# Patient Record
Sex: Female | Born: 1960 | Hispanic: No | Smoking: Former smoker
Health system: Southern US, Community
[De-identification: ages and names within clinical notes are randomized; demographics above are authoritative.]

## PROBLEM LIST (undated history)

## (undated) DIAGNOSIS — R200 Anesthesia of skin: Secondary | ICD-10-CM

## (undated) DIAGNOSIS — M19011 Primary osteoarthritis, right shoulder: Secondary | ICD-10-CM

## (undated) DIAGNOSIS — C50919 Malignant neoplasm of unspecified site of unspecified female breast: Secondary | ICD-10-CM

## (undated) DIAGNOSIS — E039 Hypothyroidism, unspecified: Secondary | ICD-10-CM

## (undated) DIAGNOSIS — Z923 Personal history of irradiation: Secondary | ICD-10-CM

## (undated) DIAGNOSIS — I10 Essential (primary) hypertension: Secondary | ICD-10-CM

## (undated) DIAGNOSIS — F32A Depression, unspecified: Secondary | ICD-10-CM

## (undated) DIAGNOSIS — E785 Hyperlipidemia, unspecified: Secondary | ICD-10-CM

## (undated) DIAGNOSIS — M542 Cervicalgia: Secondary | ICD-10-CM

## (undated) DIAGNOSIS — F329 Major depressive disorder, single episode, unspecified: Secondary | ICD-10-CM

## (undated) DIAGNOSIS — C801 Malignant (primary) neoplasm, unspecified: Secondary | ICD-10-CM

## (undated) DIAGNOSIS — M1990 Unspecified osteoarthritis, unspecified site: Secondary | ICD-10-CM

## (undated) DIAGNOSIS — K219 Gastro-esophageal reflux disease without esophagitis: Secondary | ICD-10-CM

## (undated) DIAGNOSIS — F419 Anxiety disorder, unspecified: Secondary | ICD-10-CM

## (undated) HISTORY — PX: BREAST BIOPSY: SHX20

## (undated) HISTORY — PX: BREAST LUMPECTOMY: SHX2

## (undated) HISTORY — PX: APPENDECTOMY: SHX54

## (undated) HISTORY — PX: SHOULDER ARTHROSCOPY: SHX128

## (undated) HISTORY — PX: COLONOSCOPY: SHX174

## (undated) HISTORY — PX: DILATION AND CURETTAGE OF UTERUS: SHX78

---

## 2000-10-16 ENCOUNTER — Emergency Department (HOSPITAL_COMMUNITY): Admission: EM | Admit: 2000-10-16 | Discharge: 2000-10-16 | Payer: Self-pay | Admitting: *Deleted

## 2000-10-26 ENCOUNTER — Encounter: Admission: RE | Admit: 2000-10-26 | Discharge: 2000-10-26 | Payer: Self-pay | Admitting: Internal Medicine

## 2001-04-06 ENCOUNTER — Encounter: Admission: RE | Admit: 2001-04-06 | Discharge: 2001-04-06 | Payer: Self-pay

## 2001-04-11 ENCOUNTER — Encounter: Admission: RE | Admit: 2001-04-11 | Discharge: 2001-04-11 | Payer: Self-pay

## 2001-07-13 ENCOUNTER — Encounter: Admission: RE | Admit: 2001-07-13 | Discharge: 2001-07-13 | Payer: Self-pay | Admitting: Internal Medicine

## 2001-09-15 ENCOUNTER — Encounter (INDEPENDENT_AMBULATORY_CARE_PROVIDER_SITE_OTHER): Payer: Self-pay | Admitting: *Deleted

## 2001-09-15 ENCOUNTER — Encounter: Payer: Self-pay | Admitting: Emergency Medicine

## 2001-09-16 ENCOUNTER — Encounter: Payer: Self-pay | Admitting: Emergency Medicine

## 2001-09-16 ENCOUNTER — Inpatient Hospital Stay (HOSPITAL_COMMUNITY): Admission: EM | Admit: 2001-09-16 | Discharge: 2001-09-19 | Payer: Self-pay

## 2001-11-20 ENCOUNTER — Encounter: Admission: RE | Admit: 2001-11-20 | Discharge: 2001-11-20 | Payer: Self-pay | Admitting: Obstetrics & Gynecology

## 2001-12-13 ENCOUNTER — Encounter: Admission: RE | Admit: 2001-12-13 | Discharge: 2001-12-13 | Payer: Self-pay | Admitting: Internal Medicine

## 2002-01-17 ENCOUNTER — Emergency Department (HOSPITAL_COMMUNITY): Admission: EM | Admit: 2002-01-17 | Discharge: 2002-01-17 | Payer: Self-pay

## 2002-01-17 ENCOUNTER — Encounter: Admission: RE | Admit: 2002-01-17 | Discharge: 2002-01-17 | Payer: Self-pay | Admitting: Internal Medicine

## 2002-01-18 ENCOUNTER — Emergency Department (HOSPITAL_COMMUNITY): Admission: EM | Admit: 2002-01-18 | Discharge: 2002-01-18 | Payer: Self-pay | Admitting: Emergency Medicine

## 2002-03-05 ENCOUNTER — Encounter: Admission: RE | Admit: 2002-03-05 | Discharge: 2002-03-05 | Payer: Self-pay | Admitting: Internal Medicine

## 2002-03-12 ENCOUNTER — Encounter: Admission: RE | Admit: 2002-03-12 | Discharge: 2002-03-12 | Payer: Self-pay | Admitting: Internal Medicine

## 2002-04-10 ENCOUNTER — Encounter: Payer: Self-pay | Admitting: Obstetrics

## 2002-04-10 ENCOUNTER — Inpatient Hospital Stay (HOSPITAL_COMMUNITY): Admission: AD | Admit: 2002-04-10 | Discharge: 2002-04-10 | Payer: Self-pay | Admitting: Obstetrics

## 2002-04-11 ENCOUNTER — Ambulatory Visit (HOSPITAL_COMMUNITY): Admission: RE | Admit: 2002-04-11 | Discharge: 2002-04-11 | Payer: Self-pay | Admitting: Obstetrics

## 2002-04-11 ENCOUNTER — Encounter (INDEPENDENT_AMBULATORY_CARE_PROVIDER_SITE_OTHER): Payer: Self-pay

## 2002-04-12 ENCOUNTER — Inpatient Hospital Stay (HOSPITAL_COMMUNITY): Admission: AD | Admit: 2002-04-12 | Discharge: 2002-04-12 | Payer: Self-pay | Admitting: Obstetrics

## 2002-04-12 ENCOUNTER — Encounter: Payer: Self-pay | Admitting: Obstetrics

## 2002-11-12 ENCOUNTER — Ambulatory Visit (HOSPITAL_COMMUNITY): Admission: AD | Admit: 2002-11-12 | Discharge: 2002-11-12 | Payer: Self-pay | Admitting: Obstetrics

## 2002-11-12 ENCOUNTER — Encounter (INDEPENDENT_AMBULATORY_CARE_PROVIDER_SITE_OTHER): Payer: Self-pay

## 2002-11-13 ENCOUNTER — Encounter: Payer: Self-pay | Admitting: Obstetrics

## 2002-11-13 ENCOUNTER — Encounter (INDEPENDENT_AMBULATORY_CARE_PROVIDER_SITE_OTHER): Payer: Self-pay

## 2003-05-20 ENCOUNTER — Inpatient Hospital Stay (HOSPITAL_COMMUNITY): Admission: AD | Admit: 2003-05-20 | Discharge: 2003-05-20 | Payer: Self-pay | Admitting: *Deleted

## 2003-06-19 ENCOUNTER — Inpatient Hospital Stay (HOSPITAL_COMMUNITY): Admission: AD | Admit: 2003-06-19 | Discharge: 2003-06-19 | Payer: Self-pay | Admitting: Obstetrics

## 2003-08-01 ENCOUNTER — Inpatient Hospital Stay (HOSPITAL_COMMUNITY): Admission: AD | Admit: 2003-08-01 | Discharge: 2003-08-01 | Payer: Self-pay | Admitting: Obstetrics

## 2003-11-22 ENCOUNTER — Inpatient Hospital Stay (HOSPITAL_COMMUNITY): Admission: AD | Admit: 2003-11-22 | Discharge: 2003-11-22 | Payer: Self-pay | Admitting: Obstetrics

## 2003-12-12 ENCOUNTER — Encounter: Admission: RE | Admit: 2003-12-12 | Discharge: 2003-12-12 | Payer: Self-pay | Admitting: Obstetrics

## 2003-12-15 ENCOUNTER — Encounter: Admission: RE | Admit: 2003-12-15 | Discharge: 2003-12-15 | Payer: Self-pay | Admitting: Obstetrics

## 2003-12-15 ENCOUNTER — Ambulatory Visit (HOSPITAL_COMMUNITY): Admission: RE | Admit: 2003-12-15 | Discharge: 2003-12-15 | Payer: Self-pay | Admitting: Obstetrics

## 2003-12-18 ENCOUNTER — Ambulatory Visit (HOSPITAL_COMMUNITY): Admission: AD | Admit: 2003-12-18 | Discharge: 2003-12-18 | Payer: Self-pay | Admitting: Obstetrics

## 2003-12-18 ENCOUNTER — Encounter: Admission: RE | Admit: 2003-12-18 | Discharge: 2003-12-18 | Payer: Self-pay | Admitting: Obstetrics

## 2003-12-21 ENCOUNTER — Inpatient Hospital Stay (HOSPITAL_COMMUNITY): Admission: AD | Admit: 2003-12-21 | Discharge: 2003-12-21 | Payer: Self-pay | Admitting: Obstetrics

## 2003-12-22 ENCOUNTER — Encounter: Admission: RE | Admit: 2003-12-22 | Discharge: 2003-12-22 | Payer: Self-pay | Admitting: Obstetrics

## 2003-12-25 ENCOUNTER — Encounter: Admission: RE | Admit: 2003-12-25 | Discharge: 2003-12-25 | Payer: Self-pay | Admitting: Obstetrics

## 2003-12-29 ENCOUNTER — Encounter: Admission: RE | Admit: 2003-12-29 | Discharge: 2003-12-29 | Payer: Self-pay | Admitting: *Deleted

## 2004-01-02 ENCOUNTER — Encounter: Admission: RE | Admit: 2004-01-02 | Discharge: 2004-01-02 | Payer: Self-pay | Admitting: Obstetrics

## 2004-01-05 ENCOUNTER — Encounter (INDEPENDENT_AMBULATORY_CARE_PROVIDER_SITE_OTHER): Payer: Self-pay | Admitting: Specialist

## 2004-01-05 ENCOUNTER — Inpatient Hospital Stay (HOSPITAL_COMMUNITY): Admission: RE | Admit: 2004-01-05 | Discharge: 2004-01-08 | Payer: Self-pay | Admitting: Obstetrics

## 2004-01-09 ENCOUNTER — Inpatient Hospital Stay (HOSPITAL_COMMUNITY): Admission: AD | Admit: 2004-01-09 | Discharge: 2004-01-15 | Payer: Self-pay | Admitting: Obstetrics

## 2004-01-12 ENCOUNTER — Encounter (INDEPENDENT_AMBULATORY_CARE_PROVIDER_SITE_OTHER): Payer: Self-pay | Admitting: Cardiology

## 2004-03-02 ENCOUNTER — Ambulatory Visit (HOSPITAL_COMMUNITY): Admission: RE | Admit: 2004-03-02 | Discharge: 2004-03-02 | Payer: Self-pay | Admitting: Obstetrics

## 2004-03-25 ENCOUNTER — Emergency Department (HOSPITAL_COMMUNITY): Admission: EM | Admit: 2004-03-25 | Discharge: 2004-03-25 | Payer: Self-pay | Admitting: Emergency Medicine

## 2004-04-05 ENCOUNTER — Encounter: Admission: RE | Admit: 2004-04-05 | Discharge: 2004-04-05 | Payer: Self-pay | Admitting: Internal Medicine

## 2004-05-12 ENCOUNTER — Encounter: Admission: RE | Admit: 2004-05-12 | Discharge: 2004-07-15 | Payer: Self-pay | Admitting: Orthopedic Surgery

## 2004-05-14 ENCOUNTER — Emergency Department (HOSPITAL_COMMUNITY): Admission: EM | Admit: 2004-05-14 | Discharge: 2004-05-14 | Payer: Self-pay | Admitting: Emergency Medicine

## 2004-05-17 ENCOUNTER — Encounter: Admission: RE | Admit: 2004-05-17 | Discharge: 2004-05-17 | Payer: Self-pay | Admitting: Internal Medicine

## 2004-07-23 ENCOUNTER — Encounter: Admission: RE | Admit: 2004-07-23 | Discharge: 2004-07-23 | Payer: Self-pay | Admitting: Internal Medicine

## 2004-08-03 ENCOUNTER — Encounter: Admission: RE | Admit: 2004-08-03 | Discharge: 2004-08-03 | Payer: Self-pay | Admitting: Internal Medicine

## 2004-08-11 ENCOUNTER — Encounter: Admission: RE | Admit: 2004-08-11 | Discharge: 2004-08-11 | Payer: Self-pay | Admitting: Internal Medicine

## 2004-08-23 ENCOUNTER — Encounter: Admission: RE | Admit: 2004-08-23 | Discharge: 2004-08-23 | Payer: Self-pay | Admitting: Internal Medicine

## 2004-08-23 ENCOUNTER — Ambulatory Visit: Payer: Self-pay | Admitting: Internal Medicine

## 2004-09-09 ENCOUNTER — Ambulatory Visit: Payer: Self-pay | Admitting: Internal Medicine

## 2004-09-21 ENCOUNTER — Ambulatory Visit: Payer: Self-pay | Admitting: Cardiology

## 2004-10-07 ENCOUNTER — Ambulatory Visit: Payer: Self-pay | Admitting: Cardiology

## 2004-10-13 ENCOUNTER — Emergency Department (HOSPITAL_COMMUNITY): Admission: EM | Admit: 2004-10-13 | Discharge: 2004-10-13 | Payer: Self-pay | Admitting: Family Medicine

## 2004-10-20 ENCOUNTER — Emergency Department (HOSPITAL_COMMUNITY): Admission: EM | Admit: 2004-10-20 | Discharge: 2004-10-20 | Payer: Self-pay | Admitting: Family Medicine

## 2004-10-21 ENCOUNTER — Ambulatory Visit (HOSPITAL_BASED_OUTPATIENT_CLINIC_OR_DEPARTMENT_OTHER): Admission: RE | Admit: 2004-10-21 | Discharge: 2004-10-21 | Payer: Self-pay | Admitting: Pulmonary Disease

## 2005-06-06 ENCOUNTER — Ambulatory Visit: Payer: Self-pay | Admitting: Internal Medicine

## 2005-06-10 ENCOUNTER — Ambulatory Visit (HOSPITAL_COMMUNITY): Admission: RE | Admit: 2005-06-10 | Discharge: 2005-06-10 | Payer: Self-pay | Admitting: Obstetrics

## 2005-07-03 ENCOUNTER — Emergency Department (HOSPITAL_COMMUNITY): Admission: EM | Admit: 2005-07-03 | Discharge: 2005-07-04 | Payer: Self-pay | Admitting: Emergency Medicine

## 2005-07-05 ENCOUNTER — Ambulatory Visit: Payer: Self-pay | Admitting: Internal Medicine

## 2005-07-13 ENCOUNTER — Ambulatory Visit (HOSPITAL_COMMUNITY): Admission: RE | Admit: 2005-07-13 | Discharge: 2005-07-13 | Payer: Self-pay | Admitting: Obstetrics

## 2005-07-13 ENCOUNTER — Encounter (INDEPENDENT_AMBULATORY_CARE_PROVIDER_SITE_OTHER): Payer: Self-pay | Admitting: *Deleted

## 2006-07-11 ENCOUNTER — Ambulatory Visit (HOSPITAL_COMMUNITY): Admission: RE | Admit: 2006-07-11 | Discharge: 2006-07-11 | Payer: Self-pay | Admitting: Obstetrics

## 2006-07-13 ENCOUNTER — Emergency Department (HOSPITAL_COMMUNITY): Admission: EM | Admit: 2006-07-13 | Discharge: 2006-07-13 | Payer: Self-pay | Admitting: Family Medicine

## 2006-08-03 ENCOUNTER — Ambulatory Visit: Payer: Self-pay | Admitting: Internal Medicine

## 2006-10-03 ENCOUNTER — Ambulatory Visit: Payer: Self-pay | Admitting: Internal Medicine

## 2006-10-03 ENCOUNTER — Encounter (INDEPENDENT_AMBULATORY_CARE_PROVIDER_SITE_OTHER): Payer: Self-pay | Admitting: Pulmonary Disease

## 2006-10-05 DIAGNOSIS — E669 Obesity, unspecified: Secondary | ICD-10-CM

## 2006-10-05 DIAGNOSIS — N949 Unspecified condition associated with female genital organs and menstrual cycle: Secondary | ICD-10-CM

## 2006-10-05 DIAGNOSIS — I10 Essential (primary) hypertension: Secondary | ICD-10-CM | POA: Insufficient documentation

## 2006-10-05 DIAGNOSIS — K5289 Other specified noninfective gastroenteritis and colitis: Secondary | ICD-10-CM | POA: Insufficient documentation

## 2006-10-05 DIAGNOSIS — F411 Generalized anxiety disorder: Secondary | ICD-10-CM | POA: Insufficient documentation

## 2006-10-18 DIAGNOSIS — R079 Chest pain, unspecified: Secondary | ICD-10-CM | POA: Insufficient documentation

## 2006-10-18 DIAGNOSIS — N946 Dysmenorrhea, unspecified: Secondary | ICD-10-CM | POA: Insufficient documentation

## 2006-10-18 DIAGNOSIS — G4700 Insomnia, unspecified: Secondary | ICD-10-CM | POA: Insufficient documentation

## 2006-10-18 DIAGNOSIS — M1990 Unspecified osteoarthritis, unspecified site: Secondary | ICD-10-CM | POA: Insufficient documentation

## 2007-01-04 ENCOUNTER — Ambulatory Visit: Payer: Self-pay | Admitting: Internal Medicine

## 2007-01-04 ENCOUNTER — Encounter (INDEPENDENT_AMBULATORY_CARE_PROVIDER_SITE_OTHER): Payer: Self-pay | Admitting: Internal Medicine

## 2007-01-04 LAB — CONVERTED CEMR LAB
Amphetamine Screen, Ur: NEGATIVE
BUN: 10 mg/dL (ref 6–23)
Barbiturate Quant, Ur: NEGATIVE
Benzodiazepines.: NEGATIVE
CO2: 29 meq/L (ref 19–32)
Calcium: 9.2 mg/dL (ref 8.4–10.5)
Chloride: 98 meq/L (ref 96–112)
Cocaine Metabolites: NEGATIVE
Creatinine, Ser: 0.6 mg/dL (ref 0.40–1.20)
Creatinine,U: 34 mg/dL
Glucose, Bld: 152 mg/dL — ABNORMAL HIGH (ref 70–99)
Marijuana Metabolite: NEGATIVE
Methadone: NEGATIVE
Opiates: POSITIVE — AB
Phencyclidine (PCP): NEGATIVE
Potassium: 3.7 meq/L (ref 3.5–5.3)
Propoxyphene: NEGATIVE
Sodium: 138 meq/L (ref 135–145)
TSH: 2.095 microintl units/mL (ref 0.350–5.50)

## 2007-01-12 ENCOUNTER — Emergency Department (HOSPITAL_COMMUNITY): Admission: EM | Admit: 2007-01-12 | Discharge: 2007-01-12 | Payer: Self-pay | Admitting: Emergency Medicine

## 2007-02-02 ENCOUNTER — Telehealth (INDEPENDENT_AMBULATORY_CARE_PROVIDER_SITE_OTHER): Payer: Self-pay | Admitting: Internal Medicine

## 2007-02-22 ENCOUNTER — Ambulatory Visit: Payer: Self-pay | Admitting: Hospitalist

## 2007-02-22 DIAGNOSIS — N8500 Endometrial hyperplasia, unspecified: Secondary | ICD-10-CM

## 2007-03-31 ENCOUNTER — Encounter (INDEPENDENT_AMBULATORY_CARE_PROVIDER_SITE_OTHER): Payer: Self-pay | Admitting: Pulmonary Disease

## 2007-04-08 ENCOUNTER — Emergency Department (HOSPITAL_COMMUNITY): Admission: EM | Admit: 2007-04-08 | Discharge: 2007-04-08 | Payer: Self-pay | Admitting: Emergency Medicine

## 2007-04-17 ENCOUNTER — Telehealth: Payer: Self-pay | Admitting: *Deleted

## 2007-05-03 ENCOUNTER — Ambulatory Visit: Payer: Self-pay | Admitting: Internal Medicine

## 2007-05-03 DIAGNOSIS — R7989 Other specified abnormal findings of blood chemistry: Secondary | ICD-10-CM | POA: Insufficient documentation

## 2007-05-03 LAB — CONVERTED CEMR LAB: Hgb A1c MFr Bld: 6.7 %

## 2007-05-17 ENCOUNTER — Telehealth (INDEPENDENT_AMBULATORY_CARE_PROVIDER_SITE_OTHER): Payer: Self-pay | Admitting: *Deleted

## 2007-06-06 ENCOUNTER — Emergency Department (HOSPITAL_COMMUNITY): Admission: EM | Admit: 2007-06-06 | Discharge: 2007-06-06 | Payer: Self-pay | Admitting: Family Medicine

## 2007-06-14 ENCOUNTER — Telehealth (INDEPENDENT_AMBULATORY_CARE_PROVIDER_SITE_OTHER): Payer: Self-pay | Admitting: *Deleted

## 2007-06-16 ENCOUNTER — Emergency Department (HOSPITAL_COMMUNITY): Admission: EM | Admit: 2007-06-16 | Discharge: 2007-06-17 | Payer: Self-pay | Admitting: Emergency Medicine

## 2007-06-18 ENCOUNTER — Telehealth: Payer: Self-pay | Admitting: *Deleted

## 2007-06-18 ENCOUNTER — Encounter (INDEPENDENT_AMBULATORY_CARE_PROVIDER_SITE_OTHER): Payer: Self-pay | Admitting: *Deleted

## 2007-06-28 ENCOUNTER — Emergency Department (HOSPITAL_COMMUNITY): Admission: EM | Admit: 2007-06-28 | Discharge: 2007-06-28 | Payer: Self-pay | Admitting: Emergency Medicine

## 2007-07-16 ENCOUNTER — Telehealth: Payer: Self-pay | Admitting: *Deleted

## 2007-07-19 ENCOUNTER — Telehealth: Payer: Self-pay | Admitting: *Deleted

## 2007-08-13 ENCOUNTER — Telehealth: Payer: Self-pay | Admitting: *Deleted

## 2007-12-27 DIAGNOSIS — Z9889 Other specified postprocedural states: Secondary | ICD-10-CM | POA: Insufficient documentation

## 2007-12-27 DIAGNOSIS — C801 Malignant (primary) neoplasm, unspecified: Secondary | ICD-10-CM

## 2007-12-27 HISTORY — PX: BREAST LUMPECTOMY: SHX2

## 2007-12-27 HISTORY — DX: Malignant (primary) neoplasm, unspecified: C80.1

## 2008-04-25 DIAGNOSIS — C50919 Malignant neoplasm of unspecified site of unspecified female breast: Secondary | ICD-10-CM | POA: Insufficient documentation

## 2011-05-13 NOTE — Discharge Summary (Signed)
NAME:  Morgan Taylor, Morgan Taylor                          ACCOUNT NO.:  [id number]   MEDICAL RECORD NO.:  [id number]                   PATIENT TYPE:  INP   LOCATION:  9128                                 FACILITY:  [facility]   PHYSICIAN:  Kathreen Cosier, M.D.           DATE OF BIRTH:  [birth year]   DATE OF ADMISSION:  01/05/2004  DATE OF DISCHARGE:  01/08/2004                                 DISCHARGE SUMMARY   HISTORY OF PRESENT ILLNESS:  The patient is a 50 year old, gravida 7, para 4-  0-2-4, EDC January 18, 2004, with history of chronic hypertension controlled  with Aldomet 500 mg q.8h., and diabetes followed by Dr. Chestine Spore.  Her blood  sugars were all normal, and amniocentesis performed on the day of admission  at 38 weeks showed meconium-stained fluid.  The patient was brought in for  induction, and then she decided she wanted to have a cesarean section and  tubal ligation.   HOSPITAL COURSE:  She underwent primary low transverse cesarean section of a  7 pound, 5 ounce female, Apgars 8/9, and postoperatively she did well.  Her  hemoglobin was 10.9.  Blood sugars ranged between 160 and 200, and she was  placed on Amaryl 4 mg p.o. daily with blood sugars taken before breakfast  and before dinner.  She did well, and was discharged on the third  postoperative day after consultation with Dr. Shana Chute, the cardiologist.   DISCHARGE MEDICATIONS:  1. Tylox.  2. Aldomet 500 p.o. b.i.d.   FOLLOW UP:  She is to see me in one week for a post-delivery blood pressure  check.                                               Kathreen Cosier, M.D.    BAM/MEDQ  D:  01/08/2004  T:  01/08/2004  Job:  [id number]

## 2011-05-13 NOTE — Op Note (Signed)
   NAME:  Morgan Taylor, Morgan Taylor                          ACCOUNT NO.:  [id number]   MEDICAL RECORD NO.:  [id number]                   PATIENT TYPE:  MAT   LOCATION:  MATC                                 FACILITY:  [facility]   PHYSICIAN:  Kathreen Cosier, M.D.           DATE OF BIRTH:  January 01, 1961   DATE OF PROCEDURE:  DATE OF DISCHARGE:                                 OPERATIVE REPORT   PREOPERATIVE DIAGNOSIS:  Spontaneous incomplete abortion.   POSTOPERATIVE DIAGNOSIS:  Spontaneous incomplete abortion.   PROCEDURE:   SURGEON:  Kathreen Cosier, M.D.   ANESTHESIA:  MAC.   DESCRIPTION OF PROCEDURE:  With the patient in the lithotomy position,  perineum and vagina prepped and draped and the bladder emptied with a  straight catheter.  A weighted speculum was placed in the vagina.  Anterior  lip of the cervix grasped with a tenaculum.  The cervix was injected at 3, 9  and 12 o'clock with 7 cc of 1% Xylocaine.  The endometrial cavity was  sounded to 9 cm.  The cavity was anterior.  The cervix easily admitted a #10  suction, and the uterine cavity was aspirated until the cavity was clean.  The patient tolerated the procedure well.  She was taken to the recovery  room in good condition.                                               Kathreen Cosier, M.D.    BAM/MEDQ  D:  11/13/2002  T:  11/13/2002  Job:  [id number]

## 2011-05-13 NOTE — H&P (Signed)
[facility]  Patient:    Morgan Taylor, Morgan Taylor Visit Number: [id number] MRN: [id number]          Service Type: MED Location: 2S 0256 01 Attending Physician:  Charlton Haws Dictated by:   Currie Paris, M.D. Admit Date:  09/15/2001                           History and Physical  ACCOUNT [id number]  CHIEF COMPLAINT:  Abdominal pain.  HISTORY OF PRESENT ILLNESS:  Morgan Taylor is a 50 year old lady who came to the emergency room late on the afternoon of September 15, 2001, with abdominal pain.  The pain had actually begun a few days earlier as right upper quadrant pain and had progressively gotten worse, and she is having more and more discomfort.  She said her stools have been neon-green.  The pain kind of radiated to her back.  She had never had anything similar to this before.  She pointed to the right lower quadrant as the area of almost all of her pain.  ALLERGIES:  SULFA.  MEDICATIONS:  Hydrochlorothiazide and ______ for hypertension.  She is also on Allegra.  TOBACCO/ALCOHOL:  Smokes one-half pack per day.  Alcohol, none.  REVIEW OF SYSTEMS:  HEENT:  Negative.  CHEST:  No cough or shortness of breath.  HEART:  No history of murmurs but does have hypertension.  ABDOMEN: Negative except for HPI.  GENITOURINARY:  Negative.  EXTREMITIES:  Negative.  FAMILY HISTORY:  Both parents have diabetes, controlled with oral medications.  PHYSICAL EXAMINATION:  GENERAL:  [age]-year-old female who appears uncomfortable but not toxic or acutely ill.  VITAL SIGNS:  In the emergency room initially were 124/90 blood pressure, pulse 94, respirations 18, temperature 98.1.  HEENT:  Head normocephalic.  Eyes nonicteric.  Pupils round and regular.  NECK:  Supple.  No masses or thyromegaly.  LUNGS:  Clear to auscultation.  HEART:  Regular.  No murmurs, rubs, or gallops.  ABDOMEN:  Guarding in the right lower quadrant.  Markedly tender in the  right lower quadrant, this despite intermittent pain medications throughout the evening and early a.m.  There is some mild rebound localizing to the right lower quadrant, but the rest of the abdomen seems relatively nontender.  Bowel sounds are hypoactive.  PELVIC:  Done by Dr. Jennette Kettle showed some tenderness in the right adnexal area.  LABORATORY DATA:  White count 11,900 with left shift.  Potassium was noted to be 2.6.  Amylase and lipase were normal.  Ultrasound showed some fluid in the pelvis as well as a 2 cm cyst.  Dr. Jennette Kettle stated he felt that that did not adequately explain her pain, and a CT was obtained, which showed appendicitis.  I reviewed the CT with Dr. Chestine Spore, and there is a marked thickening of the area around the appendix with stranding consistent with appendicitis, probably perforated.  IMPRESSION: 1. Acute perforated appendicitis. 2. Hypokalemia. 3. Hypertension.  PLAN:  She has already received some potassium in the emergency department and, I believe, adequate to proceed to surgery.  Will plan open appendectomy as I do not think it is going to be possible to do this laparoscopically.  The patient understands and agrees.  All questions were answered.Dictated by: Currie Paris, M.D. Attending Physician:  Charlton Haws DD:  09/16/01 TD:  09/16/01 Job: [id number]

## 2011-05-13 NOTE — H&P (Signed)
NAME:  Morgan Taylor, Morgan Taylor                          ACCOUNT NO.:  [id number]   MEDICAL RECORD NO.:  [id number]                   PATIENT TYPE:  INP   LOCATION:  9167                                 [facility]ILITY[facility]   PHYSICIAN:  Kathreen Cosier, M.D.           DATE OF BIRTH:  [birth year]   DATE OF ADMISSION:  01/05/2004  DATE OF DISCHARGE:                                HISTORY & PHYSICAL   The patient is a 50 year old, gravida 7, para 4-0-2-4, with an EDC of  January 18, 2004, and her last baby was 13 years ago. The patient is a  chronic hypertensive and diabetic.  She was on Aldomet 500 mg q.8h.  throughout the pregnancy with good control and her diabetes was thought due  to perinatal. She was controlled with 10 units of Novolog in the a.m. and  lunchtime, and 12 units in the p.m., actually those were all normal.  On  admission her fasting blood sugar was 91.  She also had a nonstress test  last weeks when she was 35 to 36 weeks. An amniocentesis was performed today  at 38 weeks for LS. The fluid was meconium stained and she was brought in  for delivery and is also desirous of sterilization. She had a negative GBS.  On admission, her cervix was long, closed, and fingertip. Vertex was -3  station. Cytotec 25 micrograms were inserted vaginally and she has had a few  contractions, no change. The situation was discussed with the patient and  she elected to undergo primary low transverse Cesarean section and tubal  ligation.   PHYSICAL EXAMINATION:  GENERAL: Well-developed female in no acute distress.  HEENT: Negative.  LUNGS: Clear.  HEART: Regular rhythm. No murmurs.  ABDOMEN: Term size uterus. Estimated fetal weight 7 pounds. Adnexa as  described above.                                               Kathreen Cosier, M.D.    BAM/MEDQ  D:  01/05/2004  T:  01/05/2004  Job:  [id number]

## 2011-05-13 NOTE — Op Note (Signed)
[facility]  Patient:    Morgan Taylor, Morgan Taylor Visit Number: [id number] MRN: [id number]          Service Type: OBS Location: MATC Attending Physician:  Venita Sheffield Dictated by:   Kathreen Cosier, M.D. Proc. Date: 04/11/02 Admit Date:  04/10/2002 Discharge Date: 04/10/2002                             Operative Report  PREOPERATIVE DIAGNOSIS:       Fetal demise at eight weeks.  POSTOPERATIVE DIAGNOSIS:      Fetal demise at eight weeks.  OPERATION:                    D&E.  SURGEON:                      Kathreen Cosier, M.D.  ANESTHESIA:                   General.  DESCRIPTION OF PROCEDURE:     Under general anesthesia with the patient in the lithotomy position, the perineum and vagina prepped and draped.  The bladder emptied with straight catheter.  Bimanual exam revealed the uterus at 10 weeks size.  A weighted speculum was placed in the vagina.  The anterior lip of the cervix was grasped with a tenaculum.  The cervix was dilated to #33 Shawnie Pons and then a #10 suction tip was inserted and the products of conception aspirated until the cavity was clean.  The patient tolerated the procedure well and taken to the recovery room in good condition. Dictated by:   Kathreen Cosier, M.D. Attending Physician:  Venita Sheffield DD:  04/11/02 TD:  04/12/02 Job: [id number]

## 2011-05-13 NOTE — Discharge Summary (Signed)
[facility]  Patient:    Morgan Taylor, Morgan Taylor Visit Number: [id number] MRN: [id number]          Service Type: MED Location: [phone] Attending Physician:  Charlton Haws Dictated by:   Currie Paris, M.D. Admit Date:  09/15/2001 Discharge Date: 09/19/2001                             Discharge Summary  DISCHARGE DIAGNOSIS:  Acute suppurative appendicitis with perforation.  HISTORY OF PRESENT ILLNESS:  [name] is a 50 year old who presented to the emergency room with a four-day history of right lower quadrant pain and fever with a CT which was consistent with appendicitis.  It was thought to be perforated.  HOSPITAL COURSE:  The patient was admitted and taken to the operating room where open cholecystectomy was performed where an acute perforated appendicitis with apparent appendiceal abscess was found.  She tolerated appendectomy well.  A 19 Blake drain was utilized.  Postoperatively, the patient had a fairly unremarkable course.  She was kept in the hospital until postop day #3, by which time she was passing gas and tolerating liquids.  Her drain had minimal drainage and was able to be pulled. Her wound was clean and some Telfa wicks, that had been placed at the time of surgery, were pulled.  Culture at the time of surgery showed E. coli.  She was advanced to a low-fat diet that day and tolerated this and was able to be discharged later in the afternoon of postop day #3.  CONDITION ON DISCHARGE:  Improved.  FOLLOWUP:  Follow up in Dr. Weldon Inches office.  DISCHARGE MEDICATIONS: 1. Resume home medications. 2. Cipro. 3. Tylox for pain. Dictated by:   Currie Paris, M.D. Attending Physician:  Charlton Haws DD:  09/24/01 TD:  09/24/01 Job: [id number]

## 2011-05-13 NOTE — Discharge Summary (Signed)
NAME:  Morgan Taylor, Morgan Taylor                          ACCOUNT NO.:  [id number]   MEDICAL RECORD NO.:  [id number]                   PATIENT TYPE:  INP   LOCATION:  9105                                 FACILITY:  [facility]   PHYSICIAN:  Kathreen Cosier, M.D.           DATE OF BIRTH:  [birth year]   DATE OF ADMISSION:  01/09/2004  DATE OF DISCHARGE:  01/15/2004                                 DISCHARGE SUMMARY   This is a readmission for a 50 year old gravida 7, para 5-0-2-5 who had a C-  section and tubal ligation on January 05, 2004 came in complaining of severe  headache, increased edema, and blood pressure 168-190/79-86, she had 2+  reflexes, her LDH was 288, uric acid __________ , SGOT 173, SGPT 184, and  she was started on magnesium sulfate.  She had a history of chronic history  and also diabetes during her pregnancy.  She was started on magnesium  sulfate and also on Norvasc.  She was seen in consult by Dr. Shana Chute.  She  had a 2-D echo which was normal.  She had monitoring telemetry which was  normal 24 hours.  By January 13, 2004 blood pressures were down to 140/89,  echocardiogram normal, glucose 113, her magnesium sulfate was discontinued,  she was seen by Dr. Shana Chute who added Diovan 160 mg p.o. once daily to her  antihypertensive regimen, the patient felt fine, she had social problems in  a sense that she was under stress because of being evicted at this time from  her house however, these problems were resolved through social service and  by January 15, 2004 her blood pressure 140/90, patient felt fine, she was  discharged home on Diovan 160 p.o. once daily, Norvasc 5 mg p.o. once daily,  to see Dr. Shana Chute in 1 week and to see me in 5 weeks.   DISCHARGE DIAGNOSIS:  Status post readmission for delayed preeclampsia.                                               Kathreen Cosier, M.D.    BAM/MEDQ  D:  01/15/2004  T:  01/16/2004  Job:  [id number]

## 2011-05-13 NOTE — Op Note (Signed)
NAME:  Morgan Taylor, Morgan Taylor                ACCOUNT NO.:  [id number]   MEDICAL RECORD NO.:  [id number]          PATIENT TYPE:  AMB   LOCATION:  SDC                           [facility]ILITY:  [facility]   PHYSICIAN:  Kathreen Cosier, M.D.DATE OF BIRTH:  [birth year]   DATE OF PROCEDURE:  DATE OF DISCHARGE:                                 OPERATIVE REPORT   PREOPERATIVE DIAGNOSIS:  Dysfunctional uterine bleeding.   DESCRIPTION OF PROCEDURE:  Under general anesthesia, the perineum and vagina  prepped and draped.  Bladder emptied with straight catheter.  Bimanual exam  revealed uterus top normal size.  Negative adnexa.  There was no prolapse.  Speculum was placed in the vagina.  Cervix was injected with 9 mL of 1%  Xylocaine at 3, 9 and 12 o'clock.  Cervix was grasped with the tenaculum,  anterior lip, and the cervix curetted with small amounts of tissue obtained.  Endometrial cavity sounded 10 cm.  Using the Hegar dilator, the cervical  length was measured at 5 cm.  Cavity length 5 cm.  Cavity width was 4.6 cm.  Cervix dilated to #25 Shawnie Pons and the hysteroscope was inserted.  The cavity  was normal.  The sharp curettage was then performed and a large amount of  tissue obtained.  The NovaSure device was inserted and the cavity integrity  test passed.  The ablation was performed for 58 seconds at 127 watts.  Repeat hysteroscopy after ablation showed the cavity to be totally ablated.  The patient's fluid deficit was 50 mL.  The patient tolerated the procedure  well and taken to the recovery room in good condition.       BAM/MEDQ  D:  07/13/2005  T:  07/13/2005  Job:  [id number]

## 2011-05-13 NOTE — Op Note (Signed)
NAME:  Morgan Taylor, Morgan Taylor                          ACCOUNT NO.:  [id number]   MEDICAL RECORD NO.:  [id number]                   PATIENT TYPE:  INP   LOCATION:  9128                                 [facility]   PHYSICIAN:  Kathreen Cosier, M.D.           DATE OF BIRTH:  [birth year]   DATE OF PROCEDURE:  01/05/2004  DATE OF DISCHARGE:                                 OPERATIVE REPORT   PREOPERATIVE DIAGNOSES:  1  Intrauterine pregnancy at 38 weeks.  1. Chronic hypertensive.  2. Diabetic on insulin.  3. Unfavorable cervix.  4. Meconium stained fluid.  5. Elective C-section desired by patient.   ANESTHESIA:  Spinal.   SURGEON:  Kathreen Cosier, M.D.   FIRST ASSISTANT:  Dr. Clearance Coots.   DESCRIPTION OF PROCEDURE:  The patient placed on the operating table in a  supine position, abdomen prepped and draped.  Bladder emptied with Foley  catheter.  Transverse suprapubic incision made, carried down to the rectus  fascia.  Fascia cleanly incised in line with the incision.  Recti muscles  retracted laterally.  Peritoneum incised longitudinally.  Transverse  incision made in the visceral peritoneum above the bladder and the bladder  mobilized inferiorly.  Transverse lower uterine incision made.  Fluid was  meconium stained, and the nasopharynx was retracted and we delivered the  shoulders.  Apgar score was 8 and 9.  Team in attendance, and the baby  weighed 7 pounds 5 ounces.  Placenta was posterior and removed manually and  sent to pathology.  Uterine cavity cleaned with dry laps.  The baby female was  delivered from the LOP position.  The uterine cavity was closed in one layer  with continuous suture of #1 chromic.  Hemostasis was satisfactory.  Bladder  flap reattached with 2-0 chromic.  Uterus was well contracted.  The ovaries  were normal.  The right tube grasped in the mid portion, divided by clamp,  #0 plain suture placed in the mesosalpinx below the portion of tube and  clamp and  this was tied and approximately 1-2 inches was transected.  Procedure was done in a similar fashion to the other side.  Lap and sponge  counts correct.  The abdomen closed in layers.  Peritoneum with continuous  suture of 0 chromic.  Fascia with continuous suture of 0 Dexon, and the skin  closed with subcuticular stitch of 3-0 Monocryl.  Blood loss was 1000 mL.  The patient tolerated the procedure well and taken to recovery room in good  condition.                                               Kathreen Cosier, M.D.    BAM/MEDQ  D:  01/05/2004  T:  01/05/2004  Job:  [id number]

## 2011-05-13 NOTE — Op Note (Signed)
[facility]  Patient:    Morgan Taylor, Morgan Taylor Visit Number: [id number] MRN: [id number]          Service Type: EMS Location: ED Attending Physician:  Pearletha Alfred Dictated by:   Currie Paris, M.D. Proc. Date: 09/16/01 Admit Date:  09/15/2001                             Operative Report  ACCOUNT NUMBER:  [id number]  PREOPERATIVE DIAGNOSIS:  Acute perforated appendicitis.  POSTOPERATIVE DIAGNOSIS:  Acute perforated appendicitis.  OPERATION:  Appendectomy.  SURGEON:  Currie Paris, M.D.  ANESTHESIA:  General endotracheal.  CLINICAL HISTORY:  This patient has presented with a several-day history of abdominal pain localized to the right lower quadrant and CT findings with appendicitis that was probably a perforation and developing phlegmon but no frank abscess could be definitely seen on the CT.  After discussion of alternatives with the patient, we proceeded to surgery.  DESCRIPTION OF PROCEDURE:  Patient was brought to the operating room and after satisfactory general endotracheal anesthesia had been obtained, the abdomen was prepped and draped.  I made a transverse right lower quadrant incision and divided subcutaneous tissues with the cautery down to the external oblique, which was opened.  The muscle layers were split and the peritoneal cavity entered.  There was a marked inflammatory mass in the right cecal area that was extending medially and posteriorly.  Some blunt dissection freed up what looked like the tip of the appendix.  There was a periappendiceal abscess which I cultured and broke up some loculations.  Unfortunately, I could not get good enough exposure with this incision so I had to extend the incision medially about half to two-thirds of the way across the rectus muscle.  With that exposure, I was then able to free up the appendix and divide the mesoappendix between clamps, either ligating or using some large clips on  some of the tissues that appeared to be more friable and I was afraid the trauma of tying would tear the vessels.  When I got down to the base of the appendix, I crushed it with a clamp, ligated it with an 0 chromic and amputated it.  A 3-0 silk was used as a pursestring to invert it.  The cecum and terminal ileum appeared normal, although there was again, as noted, the marked inflammatory process from the appendicitis present.  I irrigated the abscess cavity and it seemed to be fairly well-loculated so I decided to put a 19 Blake drain it and brought it through a separate stab wound.  I irrigated out in the pelvis a little bit and suctioned as well as along the right gutter.  The appendix did appear to be perforated.  The incision was then closed using a running 0 PDS on the peritoneum posterior sheath.  The muscle layers were irrigated and closed with a running #1 PDS. Subcu was irrigated and closed loosely with staples with some Telfa wicks placed.  The patient tolerated the procedure well.  There were no operative complications.  All counts were correct. Dictated by:   Currie Paris, M.D. Attending Physician:  Pearletha Alfred DD:  09/16/01 TD:  09/16/01 Job: [id number]

## 2011-05-13 NOTE — Procedures (Signed)
NAME:  Morgan Taylor, Morgan Taylor                ACCOUNT NO.:  [id number]   MEDICAL RECORD NO.:  [id number]          PATIENT TYPE:  OUT   LOCATION:  SLEEP CENTER                 FACILITY:  [facility]   PHYSICIAN:  Clinton D. Maple Hudson, M.D. DATE OF BIRTH:  November 15, 1961   DATE OF STUDY:  10/21/2004                              NOCTURNAL POLYSOMNOGRAM   INDICATION FOR STUDY:  Insomnia with sleep apnea.   EPWORTH SLEEPINESS SCORE:  11/24.   BMI:  34.   WEIGHT:  189 pounds.   SLEEP ARCHITECTURE:  Total sleep time 406 minutes with sleep efficiency 96%.  Stage one was 4%.  Stage two 70%.  Stages three and four 8%.  REM was 18% of  total sleep time.  Latency to sleep onset eight minutes.  Latency to REM 88  minutes.  Awake after sleep onset 12 minutes.  Arousal index was increased  at 51.  Most arousals were nonspecific.  She was reported as taking a  medication for hyperlipidemia before the study.   RESPIRATORY DATA:  NPSP protocol.  RDI 2.4 per hour which is within normal  limits.  This included one obstructive apnea and 15 hypopneas.  Most events  were while lying on her back.  REM RDI was 5.6 per hour.   OXYGEN DATA:  Moderate snoring with oxygen desaturations to a nadir of 86%,  briefly.  Mean oxygen saturation through the study was 96-97% on room air.   CARDIAC DATA:  Normal sinus rhythm.   MOVEMENT/PARASOMNIA:  A total of 17 limb jerks were recorded of which four  were associated with arousal or awakening for an insignificant periodic limb  movement with arousal index of 0.6 per hour.  The technician commented on  some increased chin EMG activity with arousal and awakening.  Sometimes this  can indicate paroxysm or snoring.   IMPRESSION/RECOMMENDATION:  Occasional sleep disorder breathing events,  within normal limits with an RDI of 2.4 per hour and brief desaturation to  86%.  No significant sleep pathology identified on this study.     Clinton D. Maple Hudson, M.D.  Diplomate, [employer] Board   CDY/MEDQ  D:  10/31/2004 11:11:55  T:  11/01/2004 09:39:19  Job:  [id number]

## 2011-10-13 LAB — POCT RAPID STREP A: Streptococcus, Group A Screen (Direct): NEGATIVE

## 2012-01-01 ENCOUNTER — Encounter: Payer: Self-pay | Admitting: *Deleted

## 2012-01-01 ENCOUNTER — Emergency Department (HOSPITAL_COMMUNITY)
Admission: EM | Admit: 2012-01-01 | Discharge: 2012-01-01 | Disposition: A | Discharge status: Home / Self Care | Payer: Medicare Other | Attending: Emergency Medicine | Admitting: Emergency Medicine

## 2012-01-01 DIAGNOSIS — IMO0001 Reserved for inherently not codable concepts without codable children: Secondary | ICD-10-CM | POA: Insufficient documentation

## 2012-01-01 DIAGNOSIS — R059 Cough, unspecified: Secondary | ICD-10-CM | POA: Insufficient documentation

## 2012-01-01 DIAGNOSIS — R112 Nausea with vomiting, unspecified: Secondary | ICD-10-CM | POA: Insufficient documentation

## 2012-01-01 DIAGNOSIS — R51 Headache: Secondary | ICD-10-CM | POA: Insufficient documentation

## 2012-01-01 DIAGNOSIS — J029 Acute pharyngitis, unspecified: Secondary | ICD-10-CM | POA: Insufficient documentation

## 2012-01-01 DIAGNOSIS — R6889 Other general symptoms and signs: Secondary | ICD-10-CM

## 2012-01-01 DIAGNOSIS — H9209 Otalgia, unspecified ear: Secondary | ICD-10-CM | POA: Insufficient documentation

## 2012-01-01 DIAGNOSIS — R05 Cough: Secondary | ICD-10-CM | POA: Insufficient documentation

## 2012-01-01 DIAGNOSIS — E119 Type 2 diabetes mellitus without complications: Secondary | ICD-10-CM | POA: Insufficient documentation

## 2012-01-01 DIAGNOSIS — J3489 Other specified disorders of nose and nasal sinuses: Secondary | ICD-10-CM | POA: Insufficient documentation

## 2012-01-01 DIAGNOSIS — R109 Unspecified abdominal pain: Secondary | ICD-10-CM | POA: Insufficient documentation

## 2012-01-01 DIAGNOSIS — R509 Fever, unspecified: Secondary | ICD-10-CM | POA: Insufficient documentation

## 2012-01-01 DIAGNOSIS — R197 Diarrhea, unspecified: Secondary | ICD-10-CM | POA: Insufficient documentation

## 2012-01-01 DIAGNOSIS — Z794 Long term (current) use of insulin: Secondary | ICD-10-CM | POA: Insufficient documentation

## 2012-01-01 DIAGNOSIS — I10 Essential (primary) hypertension: Secondary | ICD-10-CM | POA: Insufficient documentation

## 2012-01-01 HISTORY — DX: Unspecified osteoarthritis, unspecified site: M19.90

## 2012-01-01 HISTORY — DX: Malignant (primary) neoplasm, unspecified: C80.1

## 2012-01-01 HISTORY — DX: Essential (primary) hypertension: I10

## 2012-01-01 LAB — POCT I-STAT, CHEM 8
BUN: 9 mg/dL (ref 6–23)
Calcium, Ion: 1.12 mmol/L (ref 1.12–1.32)
Chloride: 100 meq/L (ref 96–112)
Creatinine, Ser: 0.5 mg/dL (ref 0.50–1.10)
Glucose, Bld: 208 mg/dL — ABNORMAL HIGH (ref 70–99)
HCT: 48 % — ABNORMAL HIGH (ref 36.0–46.0)
Hemoglobin: 16.3 g/dL — ABNORMAL HIGH (ref 12.0–15.0)
Potassium: 3.5 meq/L (ref 3.5–5.1)
Sodium: 140 mEq/L (ref 135–145)
TCO2: 28 mmol/L (ref 0–100)

## 2012-01-01 MED ORDER — DIPHENOXYLATE-ATROPINE 2.5-0.025 MG PO TABS: 1.0000 | ORAL_TABLET | Freq: Four times a day (QID) | ORAL | Status: AC | PRN

## 2012-01-01 MED ORDER — KETOROLAC TROMETHAMINE 60 MG/2ML IM SOLN
60.0000 mg | Freq: Once | INTRAMUSCULAR | Status: AC
  Administered 2012-01-01: 60 mg via INTRAMUSCULAR
  Filled 2012-01-01: qty 2

## 2012-01-01 MED ORDER — ONDANSETRON HCL 4 MG/2ML IJ SOLN
4.0000 mg | Freq: Once | INTRAMUSCULAR | Status: AC
  Administered 2012-01-01: 4 mg via INTRAMUSCULAR
  Filled 2012-01-01: qty 2

## 2012-01-01 MED ORDER — ONDANSETRON 4 MG PO TBDP: 4.0000 mg | ORAL_TABLET | Freq: Three times a day (TID) | ORAL | Status: AC | PRN

## 2012-01-01 MED ORDER — ONDANSETRON 4 MG PO TBDP
4.0000 mg | ORAL_TABLET | Freq: Once | ORAL | Status: AC
  Administered 2012-01-01: 4 mg via ORAL
  Filled 2012-01-01: qty 1

## 2012-01-01 NOTE — ED Provider Notes (Signed)
Medical screening examination/treatment/procedure(s) were performed by non-physician practitioner and as supervising physician I was immediately available for consultation/collaboration. Jessalynn Mccowan Y.   Tod Abrahamsen Y. Josten Warmuth, MD 01/01/12 1549 

## 2012-01-01 NOTE — ED Notes (Signed)
Pt walked away from stretcher as Diona Foley RN came to bedside to give Zofran, pt aware rn had medicine in hand, rn will reattempt to give med

## 2012-01-01 NOTE — ED Notes (Signed)
To ED for eval of n/v/d, body aches, headache, ear pain. Onset at 3am. Reports family members in household with similar symptoms. Denies any RX at home Unable to tolerate fluids

## 2012-01-01 NOTE — ED Provider Notes (Signed)
History     CSN: [id number]  Arrival date & time 01/01/12  1125   First MD Initiated Contact with Patient 01/01/12 1144      Chief Complaint  Patient presents with  . Nausea  . Diarrhea  . Generalized Body Aches  . Headache    (Consider location/radiation/quality/duration/timing/severity/associated sxs/prior treatment) HPI  Pt presents to the ED with complaints of flu-like symptoms of cough, congestion, sore throat, muscle aches, chills, fevers, ear pain, headaches, abdominal pain, vomiting, diarrhea. The patient states that the symptoms started this morning at 7am.  Pt has been around other sick contacts and did not get the flu shot this year. The patient denies headaches, neck pain, weakness, vision changes, severe abdominal pain, inability to eat or drink, difficulty breathing, SOB, wheezing, chest pain. The patient has tried cough medicine, NSAIDS, and rest but has only felt mild relief.    Past Medical History  Diagnosis Date  . 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 Diabetes mellitus   . Hypertension   . Arthritis     Past Surgical History  Procedure Date  . Appendectomy     History reviewed. No pertinent family history.  History  Substance Use Topics  . Smoking status: Never Smoker   . Smokeless tobacco: Never Used  . Alcohol Use: No    OB History    Grav Para Term Preterm Abortions TAB SAB Ect Mult Living                  Review of Systems  All other systems reviewed and are negative.    Allergies  Sulfonamide derivatives  Home Medications   Current Outpatient Rx  Name Route Sig Dispense Refill  . AMITRIPTYLINE HCL 150 MG PO TABS Oral Take 150 mg by mouth at bedtime.      Marland Kitchen AMLODIPINE BESYLATE 10 MG PO TABS Oral Take 10 mg by mouth daily.      Marland Kitchen HYDROCODONE-ACETAMINOPHEN 10-325 MG PO TABS Oral Take 1 tablet by mouth every 6 (six) hours as needed. For pain     . INSULIN GLARGINE 100 UNIT/ML [place] SOLN Subcutaneous Inject 30 Units into the skin at bedtime.       Marland Kitchen METOPROLOL TARTRATE 25 MG PO TABS Oral Take 25 mg by mouth daily.      Marland Kitchen SIMVASTATIN 40 MG PO TABS Oral Take 40 mg by mouth every evening.      Marland Kitchen DIPHENOXYLATE-ATROPINE 2.5-0.025 MG PO TABS Oral Take 1 tablet by mouth 4 (four) times daily as needed for diarrhea or loose stools. 30 tablet 0  . ONDANSETRON 4 MG PO TBDP Oral Take 1 tablet (4 mg total) by mouth every 8 (eight) hours as needed for nausea. 20 tablet 0    BP 121/72  Pulse 94  Temp(Src) 97.8 F (36.6 C) (Oral)  Resp 16  SpO2 98%  Physical Exam  Nursing note and vitals reviewed. Constitutional: She is oriented to person, place, and time. She appears well-developed and well-nourished.  HENT:  Head: Normocephalic and atraumatic.  Right Ear: External ear normal.  Left Ear: External ear normal.  Mouth/Throat: Oropharynx is clear and moist. No oropharyngeal exudate.  Eyes: Conjunctivae are normal. Pupils are equal, round, and reactive to light.  Neck: Trachea normal, normal range of motion and full passive range of motion without pain. Neck supple.  Cardiovascular: Normal rate, regular rhythm and normal pulses.   Pulmonary/Chest: Effort normal and breath sounds normal. Chest wall is not dull to percussion.  She exhibits no tenderness, no crepitus, no edema, no deformity and no retraction.  Abdominal: Soft. Normal appearance and bowel sounds are normal. She exhibits no distension. There is no tenderness. There is no rebound.  Musculoskeletal: Normal range of motion.  Lymphadenopathy:       Head (right side): No submental, no submandibular, no tonsillar, no preauricular, no posterior auricular and no occipital adenopathy present.       Head (left side): No submental, no submandibular, no tonsillar, no preauricular, no posterior auricular and no occipital adenopathy present.    She has no cervical adenopathy.    She has no axillary adenopathy.  Neurological: She is oriented to person, place, and time. She has normal strength.    Skin: Skin is warm, dry and intact.  Psychiatric: She has a normal mood and affect. Her speech is normal and behavior is normal. Judgment and thought content normal. Cognition and memory are normal.    ED Course  Procedures (including critical care time)  Labs Reviewed - No data to display No results found.   1. Flu-like symptoms       MDM  Pt concerned with the nausea and vomiting. She says that her husband and daughter are both having the same symptoms currently. None of them got the flu shot.        Dorthula Matas, PA 01/01/12 1327

## 2012-01-19 ENCOUNTER — Telehealth: Payer: Self-pay | Admitting: *Deleted

## 2012-01-19 NOTE — Telephone Encounter (Signed)
Confirmed 01/24/12 appt w/ pt.  Mailed before appt letter & packet to pt.

## 2012-01-23 ENCOUNTER — Other Ambulatory Visit: Payer: Self-pay | Admitting: *Deleted

## 2012-01-23 DIAGNOSIS — C50512 Malignant neoplasm of lower-outer quadrant of left female breast: Secondary | ICD-10-CM | POA: Insufficient documentation

## 2012-01-23 DIAGNOSIS — C50519 Malignant neoplasm of lower-outer quadrant of unspecified female breast: Secondary | ICD-10-CM

## 2012-01-24 ENCOUNTER — Telehealth: Payer: Self-pay | Admitting: Oncology

## 2012-01-24 ENCOUNTER — Ambulatory Visit (HOSPITAL_BASED_OUTPATIENT_CLINIC_OR_DEPARTMENT_OTHER): Payer: Medicare Other | Admitting: Oncology

## 2012-01-24 ENCOUNTER — Other Ambulatory Visit (HOSPITAL_BASED_OUTPATIENT_CLINIC_OR_DEPARTMENT_OTHER): Payer: Medicare Other | Admitting: Lab

## 2012-01-24 ENCOUNTER — Ambulatory Visit: Payer: Medicare Other

## 2012-01-24 VITALS — BP 128/79 | HR 88 | Temp 98.2°F | Ht 62.0 in | Wt 191.1 lb

## 2012-01-24 DIAGNOSIS — C50519 Malignant neoplasm of lower-outer quadrant of unspecified female breast: Secondary | ICD-10-CM

## 2012-01-24 DIAGNOSIS — C50919 Malignant neoplasm of unspecified site of unspecified female breast: Secondary | ICD-10-CM

## 2012-01-24 LAB — COMPREHENSIVE METABOLIC PANEL
ALT: 24 U/L (ref 0–35)
AST: 26 U/L (ref 0–37)
Calcium: 9.2 mg/dL (ref 8.4–10.5)
Chloride: 101 mEq/L (ref 96–112)
Creatinine, Ser: 0.59 mg/dL (ref 0.50–1.10)
Sodium: 137 mEq/L (ref 135–145)
Total Protein: 7.3 g/dL (ref 6.0–8.3)

## 2012-01-24 LAB — CBC WITH DIFFERENTIAL/PLATELET
BASO%: 0.5 % (ref 0.0–2.0)
EOS%: 2.3 % (ref 0.0–7.0)
HCT: 41.9 % (ref 34.8–46.6)
MCH: 29.9 pg (ref 25.1–34.0)
MCHC: 33.3 g/dL (ref 31.5–36.0)
NEUT%: 58 % (ref 38.4–76.8)
RBC: 4.67 10*6/uL (ref 3.70–5.45)
RDW: 13.6 % (ref 11.2–14.5)
lymph#: 2.3 10*3/uL (ref 0.9–3.3)

## 2012-01-24 LAB — CANCER ANTIGEN 27.29: CA 27.29: 27 U/mL (ref 0–39)

## 2012-01-24 MED ORDER — ANASTROZOLE 1 MG PO TABS: 1.0000 mg | ORAL_TABLET | Freq: Every day | ORAL | Status: AC

## 2012-01-24 NOTE — Telephone Encounter (Signed)
gve the pt her July 2013 appt calendar °

## 2012-01-25 ENCOUNTER — Encounter: Payer: Self-pay | Admitting: *Deleted

## 2012-01-25 NOTE — Progress Notes (Signed)
Mailed after appt letter to pt. 

## 2012-01-26 NOTE — Progress Notes (Signed)
Morgan Taylor [id number] [birth year] 51 y.o. 01/26/2012 10:05 PM  CC Dr. 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  STAGE:   REFERRING PHYSICIAN: Dr. Quintella Reichert  HISTORY OF PRESENT ILLNESS:  Morgan Taylor is a 51 y.o. female.  With multiple medical problems. Her oncologic history dates back to 2009 when she was found to have a breast mass. She underwent a lumpectomy and radiation therapy for an early stage breast cancer. After she completed her radiation therapy she was placed on Arimidex 1 mg daily. She tolerated it quite well. Thereafter she was moved to [place] to be closer to her family. Unfortunately she did not have money and she ran out of her Arimidex for the last 1-1/2 years. She is now reestablishing her care to the [facility]. She is without any complaints today to   Past Medical History: Oncologic history: Patient at the age of 11 noted on every on her mammogram to have a mass. There was noted to him by ultrasound to be an irregularly marginated nodule in the 6:00 position of the left breast measuring 8 x 6 mm. She subsequently went on to have a core needle biopsy that revealed a invasive ductal carcinoma nuclear grade 2. The tumor was estrogen receptor positive. Because of this she went on to have a lumpectomy followed by radiation therapy. She tolerated this quite well. She did was then begun on adjuvant Arimidex. She tolerated the Arimidex well and then move to [facility]. She has not been taking any of the adjuvant therapy for about one to 1-1/2 years. Past Medical History  Diagnosis Date  . 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 Diabetes mellitus   . Hypertension   . Arthritis     Past Surgical History: Past Surgical History  Procedure Date  . Appendectomy     Family History: Father is a live at the age of 50 mother is alive in her 50s patient has  one brother and one sister. She apparently has a daughter was 13 years old and has lymphadenopathy no diagnosed cancers otherwise  No family history on file.  Social History patient is currently on disability. She has 3 children all of whom lives in [place]. History  Substance Use Topics  . Smoking status: Never Smoker   . Smokeless tobacco: Never Used  . Alcohol Use: No    Allergies: Allergies  Allergen Reactions  . Sulfonamide Derivatives Hives    Current Medications: Current Outpatient Prescriptions  Medication Sig Dispense Refill  . DULoxetine (CYMBALTA) 60 MG capsule Take 60 mg by mouth daily.      . metoprolol succinate (TOPROL-XL) 50 MG 24 hr tablet Take 50 mg by mouth daily. Take with or immediately following a meal.      . amitriptyline (ELAVIL) 150 MG tablet Take 150 mg by mouth at bedtime.        Marland Kitchen amLODipine (NORVASC) 10 MG tablet Take 10 mg by mouth daily.        Marland Kitchen anastrozole (ARIMIDEX) 1 MG tablet Take 1 tablet (1 mg total) by mouth daily.  90 tablet  12  . HYDROcodone-acetaminophen (NORCO) 10-325 MG per tablet Take 1 tablet by mouth every 6 (six) hours as needed. For pain      . insulin glargine (LANTUS) 100 UNIT/ML injection Inject 30 Units into the skin at bedtime.        Marland Kitchen  metoprolol tartrate (LOPRESSOR) 25 MG tablet Take 25 mg by mouth daily.        . simvastatin (ZOCOR) 40 MG tablet Take 40 mg by mouth every evening.          OB/GYN History: She had menarche at age 12 she is postmenopausal she has had 5 full term pregnancies the first live birth was at 68. She has not used hormone replacement therapy.  Fertility Discussion: N/A Prior History of Cancer: As stated above  Health Maintenance:  Colonoscopy patient has never had a colonoscopy Bone Density she had a bone density in 2009 Last PAP smear last Pap smear was in 2012  ECOG PERFORMANCE STATUS: 1 - Symptomatic but completely ambulatory  Genetic Counseling/testing: Patient and I did discuss genetic  testing and counseling. Apparently this was offered to her in [place] but could not be done due to insurance issues. At this time she is also not able to get it done due to insurance issues. However she is offered to go to [facility] for possible genetic counseling and testing patient will think about  REVIEW OF SYSTEMS:  Constitutional: positive for fatigue Respiratory: positive for asthma Cardiovascular: negative Gastrointestinal: negative Genitourinary:negative Integument/breast: negative Hematologic/lymphatic: negative Musculoskeletal:negative Neurological: negative Behavioral/Psych: negative Endocrine: negative Allergic/Immunologic: negative  PHYSICAL EXAMINATION: Blood pressure 128/79, pulse 88, temperature 98.2 F (36.8 C), temperature source Oral, height 5\' 2"  (1.575 m), weight 191 lb 1.6 oz (86.682 kg).  BJY:NWGNF, healthy and no distress SKIN: skin color, texture, turgor are normal HEAD: No masses, lesions, tenderness or abnormalities EYES: PERRLA, EOMI, Conjunctiva are pink and non-injected, sclera clear EARS: External ears normal OROPHARYNX:no exudate, no erythema and lips, buccal mucosa, and tongue normal  NECK: no adenopathy, no bruits, no JVD, thyroid normal size, non-tender, without nodularity LYMPH:  no palpable lymphadenopathy, no hepatosplenomegaly BREAST:surgical scars noted surgical scar is noted in the left breast there is no nipple discharge inversion or retraction. No palpable masses right breast without any masses nipple discharge. LUNGS: clear to auscultation , and palpation, clear to auscultation and percussion HEART: regular rate & rhythm, no murmurs and no gallops ABDOMEN:abdomen soft, non-tender, obese, normal bowel sounds and no masses or organomegaly BACK: No CVA tenderness EXTREMITIES:no joint deformities, effusion, or inflammation, no edema, no clubbing, no cyanosis  NEURO: alert & oriented x 3 with fluent speech, no focal motor/sensory  deficits, gait normal  STUDIES/RESULTS: No results found.   LABS:    Chemistry      Component Value Date/Time   NA 137 01/24/2012 1036   K 3.9 01/24/2012 1036   CL 101 01/24/2012 1036   CO2 26 01/24/2012 1036   BUN 7 01/24/2012 1036   CREATININE 0.59 01/24/2012 1036      Component Value Date/Time   CALCIUM 9.2 01/24/2012 1036   ALKPHOS 83 01/24/2012 1036   AST 26 01/24/2012 1036   ALT 24 01/24/2012 1036   BILITOT 0.5 01/24/2012 1036      Lab Results  Component Value Date   WBC 6.9 01/24/2012   HGB 14.0 01/24/2012   HCT 41.9 01/24/2012   MCV 89.9 01/24/2012   PLT 277 01/24/2012     ASSESSMENT    51 year old female:  #1 previous history of invasive ductal carcinoma of the left breast that was grade 2. She underwent a lumpectomy followed by radiation therapy for a stage IA cancer that was ER positive. She has also had radiation therapy. Post radiation she was begun on adjuvant antiestrogen therapy consisting of  Arimidex. She unfortunately has not had any of her treatment for the last 1-1-1/2 years  #2 multiple other medical problems for which she is seeing Dr. Rocky Crafts  PLAN:    #1 I have gone over the diagnosis and treatment options for patients breast cancer. I do concur that she was treated adequately and appropriately. However she does need to be back on her antiestrogen therapy years or so.  #3 all questions were answered today. Patient has a good understanding of her breast diagnosed cancer diagnosis.  #4 patient and I did discuss genetics again however at this time patient would like to defer this and she certainly will let me know what it when the time is right for her. We certainly will be happy to help her get her an genetic counseling done at [facility]..  #2 I have written a prescription for her for antiestrogen therapy. It is recommended that we continue the antiestrogen for another 3-4 years do to her not being able to take it for the last 1-1/2        Discussion: Patient is being treated per NCCN breast cancer care guidelines appropriate for stage I   Thank you so much for allowing me to participate in the care of KYLIAH DEANDA. I will continue to follow up the patient with you and assist in her care.  All questions were answered. The patient knows to call the clinic with any problems, questions or concerns. We can certainly see the patient much sooner if necessary.  I spent 40 minutes counseling the patient face to face. The total time spent in the appointment was 60 minutes.  Drue Second, MD Medical/Oncology [facility] [phone] (beeper) [phone] (Office)  01/26/2012, 10:05 PM 01/26/2012, 10:05 PM

## 2012-07-20 ENCOUNTER — Telehealth: Payer: Self-pay | Admitting: Oncology

## 2012-07-20 ENCOUNTER — Other Ambulatory Visit (HOSPITAL_BASED_OUTPATIENT_CLINIC_OR_DEPARTMENT_OTHER): Payer: Medicare Other | Admitting: Lab

## 2012-07-20 ENCOUNTER — Ambulatory Visit: Payer: Medicare Other | Admitting: Oncology

## 2012-07-20 DIAGNOSIS — C50919 Malignant neoplasm of unspecified site of unspecified female breast: Secondary | ICD-10-CM

## 2012-07-20 LAB — CBC WITH DIFFERENTIAL/PLATELET
BASO%: 0.2 % (ref 0.0–2.0)
EOS%: 0.6 % (ref 0.0–7.0)
HCT: 40.5 % (ref 34.8–46.6)
LYMPH%: 20.8 % (ref 14.0–49.7)
MCH: 30.4 pg (ref 25.1–34.0)
MCHC: 33.1 g/dL (ref 31.5–36.0)
NEUT%: 74.8 % (ref 38.4–76.8)
Platelets: 257 10*3/uL (ref 145–400)

## 2012-07-20 LAB — COMPREHENSIVE METABOLIC PANEL
ALT: 31 U/L (ref 0–35)
AST: 29 U/L (ref 0–37)
Alkaline Phosphatase: 99 U/L (ref 39–117)
Creatinine, Ser: 0.7 mg/dL (ref 0.50–1.10)
Total Bilirubin: 0.4 mg/dL (ref 0.3–1.2)

## 2012-07-20 NOTE — Telephone Encounter (Signed)
gve the pt her aug 2013 appt calendar 

## 2012-08-01 ENCOUNTER — Other Ambulatory Visit (HOSPITAL_BASED_OUTPATIENT_CLINIC_OR_DEPARTMENT_OTHER): Payer: Medicare Other | Admitting: Lab

## 2012-08-01 ENCOUNTER — Ambulatory Visit (HOSPITAL_BASED_OUTPATIENT_CLINIC_OR_DEPARTMENT_OTHER): Payer: Medicare Other | Admitting: Oncology

## 2012-08-01 ENCOUNTER — Telehealth: Payer: Self-pay | Admitting: Oncology

## 2012-08-01 ENCOUNTER — Encounter: Payer: Self-pay | Admitting: Oncology

## 2012-08-01 ENCOUNTER — Other Ambulatory Visit: Payer: Self-pay | Admitting: *Deleted

## 2012-08-01 VITALS — BP 152/89 | HR 83 | Temp 97.9°F | Resp 20 | Ht 62.0 in | Wt 196.3 lb

## 2012-08-01 DIAGNOSIS — C50519 Malignant neoplasm of lower-outer quadrant of unspecified female breast: Secondary | ICD-10-CM

## 2012-08-01 DIAGNOSIS — C50919 Malignant neoplasm of unspecified site of unspecified female breast: Secondary | ICD-10-CM

## 2012-08-01 LAB — CBC WITH DIFFERENTIAL/PLATELET
BASO%: 0.4 % (ref 0.0–2.0)
EOS%: 1.8 % (ref 0.0–7.0)
HCT: 40.9 % (ref 34.8–46.6)
LYMPH%: 29.4 % (ref 14.0–49.7)
MCH: 30.6 pg (ref 25.1–34.0)
MCHC: 33.4 g/dL (ref 31.5–36.0)
MONO#: 0.7 10*3/uL (ref 0.1–0.9)
NEUT%: 60.6 % (ref 38.4–76.8)
Platelets: 266 10*3/uL (ref 145–400)

## 2012-08-01 LAB — COMPREHENSIVE METABOLIC PANEL
ALT: 33 U/L (ref 0–35)
CO2: 32 mEq/L (ref 19–32)
Creatinine, Ser: 0.76 mg/dL (ref 0.50–1.10)
Total Bilirubin: 0.6 mg/dL (ref 0.3–1.2)

## 2012-08-01 MED ORDER — ANASTROZOLE 1 MG PO TABS: 1.0000 mg | ORAL_TABLET | Freq: Every day | ORAL | Status: AC

## 2012-08-01 NOTE — Patient Instructions (Addendum)
Mammogram at the breast center  I will see you back in 6 months

## 2012-08-01 NOTE — Telephone Encounter (Signed)
gve the pt her feb 2014 appt calendar along with the mammo appt for aug at the [facility]

## 2012-08-01 NOTE — Progress Notes (Signed)
OFFICE PROGRESS NOTE  CC Dr. Quintella Reichert  DIAGNOSIS: 51 year old female with stage I invasive ductal carcinoma of the left breast diagnosed in 2009  PRIOR THERAPY:  #1 2009 patient found a breast mass she went on to have a lumpectomy and radiation therapy. She after radiation she was started on Arimidex 1 mg daily. She was diagnosed originally in [facility]. Original tumor size was 6 x 8 mm nuclear grade 2 ER positive.  CURRENT THERAPY:Arimidex 1 mg daily  INTERVAL HISTORY: Morgan Taylor 51 y.o. female returns for Followup visit today. Her last visit with me was back in January 2013 as an inpatient. She hasn't relocated to [facility] and she came to establish her care. Overall she is doing well she has not had a mammogram and therefore we will go ahead and get her scheduled for that. She is tolerating Arimidex well complaints no aches or pains. Remainder of the 10 point review of systems is negative.  MEDICAL HISTORY: Past Medical History  Diagnosis Date  . 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 Diabetes mellitus   . Hypertension   . Arthritis     ALLERGIES:  is allergic to sulfonamide derivatives.  MEDICATIONS:  Current Outpatient Prescriptions  Medication Sig Dispense Refill  . amitriptyline (ELAVIL) 150 MG tablet Take 150 mg by mouth at bedtime.        Marland Kitchen amLODipine (NORVASC) 10 MG tablet Take 10 mg by mouth daily.        . hydrochlorothiazide (HYDRODIURIL) 25 MG tablet Take 25 mg by mouth daily.      Marland Kitchen HYDROcodone-acetaminophen (NORCO) 10-325 MG per tablet Take 1 tablet by mouth every 6 (six) hours as needed. For pain      . insulin glargine (LANTUS) 100 UNIT/ML injection Inject 30 Units into the skin at bedtime.        Marland Kitchen levothyroxine (SYNTHROID, LEVOTHROID) 25 MCG tablet Take 25 mcg by mouth daily.      . meloxicam (MOBIC) 15 MG tablet Take 15 mg by mouth daily.      . metoprolol succinate (TOPROL-XL) 50 MG 24 hr tablet Take 50 mg by mouth daily. Take with or immediately  following a meal.      . omeprazole (PRILOSEC) 20 MG capsule Take 20 mg by mouth daily.      . potassium chloride (KLOR-CON) 20 MEQ packet Take 10 mEq by mouth 2 (two) times daily.      . DULoxetine (CYMBALTA) 60 MG capsule Take 60 mg by mouth daily.      . metoprolol tartrate (LOPRESSOR) 25 MG tablet Take 25 mg by mouth daily.        . simvastatin (ZOCOR) 40 MG tablet Take 40 mg by mouth every evening.          SURGICAL HISTORY:  Past Surgical History  Procedure Date  . Appendectomy     REVIEW OF SYSTEMS:  A comprehensive review of systems was negative.   PHYSICAL EXAMINATION:  Gen.: Awake alert obese female in no acute distress HEENT exam is my PERRLA sclerae anicteric no conjunctival pallor oral mucosa is moist neck is supple no palpable cervical supraclavicular adenopathy lungs are clear to auscultation cardiovascular is regular rate rhythm abdomen is soft nontender nondistended bowel sounds are present no HSM extremities no edema Bilateral breast examination left breast well-healed incisional scar no nodularity no nipple discharge no other masses right breast no masses or nipple discharge  ECOG PERFORMANCE STATUS: 0 - Asymptomatic  Blood pressure  152/89, pulse 83, temperature 97.9 F (36.6 C), resp. rate 20, height 5\' 2"  (1.575 m), weight 196 lb 4.8 oz (89.041 kg).  LABORATORY DATA: Lab Results  Component Value Date   WBC 8.7 08/01/2012   HGB 13.7 08/01/2012   HCT 40.9 08/01/2012   MCV 91.8 08/01/2012   PLT 266 08/01/2012      Chemistry      Component Value Date/Time   NA 138 07/20/2012 1455   K 4.4 07/20/2012 1455   CL 100 07/20/2012 1455   CO2 28 07/20/2012 1455   BUN 15 07/20/2012 1455   CREATININE 0.70 07/20/2012 1455      Component Value Date/Time   CALCIUM 9.5 07/20/2012 1455   ALKPHOS 99 07/20/2012 1455   AST 29 07/20/2012 1455   ALT 31 07/20/2012 1455   BILITOT 0.4 07/20/2012 1455       RADIOGRAPHIC STUDIES:  No results found.  ASSESSMENT: 51 year old female with  stage I invasive ductal carcinoma status post lumpectomy of the left breast. Patient went on to receive radiation therapy followed by Arimidex adjuvantly. She is now 2 years out since start of her therapy. She is tolerating Arimidex very nicely.   PLAN:   #1 patient will need a diagnostic mammogram and I have gone ahead and set this up for her.  #2 she will continue Arimidex and a new prescription was sent to her pharmacy.  #3 patient will return in 6 months time for followup.   All questions were answered. The patient knows to call the clinic with any problems, questions or concerns. We can certainly see the patient much sooner if necessary.  I spent 25 minutes counseling the patient face to face. The total time spent in the appointment was 30 minutes.    Drue Second, MD Medical/Oncology [facility] [phone] (beeper) [phone] (Office)  08/01/2012, 1:56 PM

## 2012-08-28 ENCOUNTER — Ambulatory Visit
Admission: RE | Admit: 2012-08-28 | Discharge: 2012-08-28 | Disposition: A | Discharge status: Home / Self Care | Payer: Medicare Other | Source: Ambulatory Visit | Attending: Oncology | Admitting: Oncology

## 2012-08-28 DIAGNOSIS — C50919 Malignant neoplasm of unspecified site of unspecified female breast: Secondary | ICD-10-CM

## 2013-01-28 ENCOUNTER — Other Ambulatory Visit: Payer: Self-pay | Admitting: Oncology

## 2013-02-04 ENCOUNTER — Other Ambulatory Visit: Payer: Medicare Other | Admitting: Lab

## 2013-02-04 ENCOUNTER — Ambulatory Visit: Payer: Medicare Other | Admitting: Oncology

## 2013-06-09 ENCOUNTER — Emergency Department: Payer: Self-pay | Admitting: Emergency Medicine

## 2013-07-27 ENCOUNTER — Encounter (HOSPITAL_COMMUNITY): Payer: Self-pay | Admitting: Emergency Medicine

## 2013-07-27 ENCOUNTER — Emergency Department (HOSPITAL_COMMUNITY)
Admission: EM | Admit: 2013-07-27 | Discharge: 2013-07-27 | Disposition: A | Discharge status: Home / Self Care | Payer: Medicare Other | Attending: Emergency Medicine | Admitting: Emergency Medicine

## 2013-07-27 ENCOUNTER — Inpatient Hospital Stay (HOSPITAL_COMMUNITY): Admission: AD | Admit: 2013-07-27 | Payer: Medicare Other

## 2013-07-27 DIAGNOSIS — M129 Arthropathy, unspecified: Secondary | ICD-10-CM | POA: Insufficient documentation

## 2013-07-27 DIAGNOSIS — Z794 Long term (current) use of insulin: Secondary | ICD-10-CM | POA: Insufficient documentation

## 2013-07-27 DIAGNOSIS — Z79899 Other long term (current) drug therapy: Secondary | ICD-10-CM | POA: Insufficient documentation

## 2013-07-27 DIAGNOSIS — E119 Type 2 diabetes mellitus without complications: Secondary | ICD-10-CM | POA: Insufficient documentation

## 2013-07-27 DIAGNOSIS — R05 Cough: Secondary | ICD-10-CM | POA: Insufficient documentation

## 2013-07-27 DIAGNOSIS — N390 Urinary tract infection, site not specified: Secondary | ICD-10-CM | POA: Insufficient documentation

## 2013-07-27 DIAGNOSIS — I10 Essential (primary) hypertension: Secondary | ICD-10-CM | POA: Insufficient documentation

## 2013-07-27 DIAGNOSIS — M549 Dorsalgia, unspecified: Secondary | ICD-10-CM

## 2013-07-27 DIAGNOSIS — J40 Bronchitis, not specified as acute or chronic: Secondary | ICD-10-CM

## 2013-07-27 DIAGNOSIS — R059 Cough, unspecified: Secondary | ICD-10-CM | POA: Insufficient documentation

## 2013-07-27 DIAGNOSIS — R300 Dysuria: Secondary | ICD-10-CM | POA: Insufficient documentation

## 2013-07-27 LAB — URINALYSIS, ROUTINE W REFLEX MICROSCOPIC
Bilirubin Urine: NEGATIVE
Glucose, UA: NEGATIVE mg/dL
Hgb urine dipstick: NEGATIVE
Ketones, ur: NEGATIVE mg/dL
Nitrite: POSITIVE — AB
Protein, ur: NEGATIVE mg/dL
Specific Gravity, Urine: 1.019 (ref 1.005–1.030)
Urobilinogen, UA: 2 mg/dL — ABNORMAL HIGH (ref 0.0–1.0)
pH: 5.5 (ref 5.0–8.0)

## 2013-07-27 LAB — URINE MICROSCOPIC-ADD ON

## 2013-07-27 LAB — GLUCOSE, CAPILLARY: Glucose-Capillary: 117 mg/dL — ABNORMAL HIGH (ref 70–99)

## 2013-07-27 MED ORDER — SULFAMETHOXAZOLE-TRIMETHOPRIM 800-160 MG PO TABS: 1.0000 | ORAL_TABLET | Freq: Two times a day (BID) | ORAL | Status: DC

## 2013-07-27 MED ORDER — PHENAZOPYRIDINE HCL 200 MG PO TABS: 200.0000 mg | ORAL_TABLET | Freq: Three times a day (TID) | ORAL | Status: DC | PRN

## 2013-07-27 MED ORDER — SODIUM CHLORIDE 0.9 % IV BOLUS (SEPSIS): 1000.0000 mL | INTRAVENOUS | Status: DC

## 2013-07-27 MED ORDER — CYCLOBENZAPRINE HCL 10 MG PO TABS: 10.0000 mg | ORAL_TABLET | Freq: Two times a day (BID) | ORAL | Status: DC | PRN
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
## 2013-07-27 NOTE — ED Provider Notes (Signed)
MSE was initiated and I personally evaluated the patient and placed orders.  S: Pt with HTN and DM c/o bilateral mid-low back pain over the last week associated with nausea and dysuria.   O: pt has bilateral CVA tenderness A: concern for pyelonephritis P: check basic labs, get pt's pain and nausea under control and discharge home with antibiotics if stable.   The patient appears stable so that the remainder of the MSE may be completed by Dr. Blinda Leatherwood who agreed to take over pt care.   Junius Finner, PA-C 07/28/13 1627
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
## 2013-07-30 NOTE — ED Provider Notes (Signed)
Medical screening examination/treatment/procedure(s) were performed by non-physician practitioner and as supervising physician I was immediately available for consultation/collaboration.    Gilda Crease, MD 07/30/13 [phone]

## 2013-08-27 ENCOUNTER — Other Ambulatory Visit: Payer: Self-pay | Admitting: Oncology

## 2013-08-27 DIAGNOSIS — Z853 Personal history of malignant neoplasm of breast: Secondary | ICD-10-CM

## 2013-09-27 ENCOUNTER — Ambulatory Visit
Admission: RE | Admit: 2013-09-27 | Discharge: 2013-09-27 | Disposition: A | Discharge status: Home / Self Care | Payer: Medicare Other | Source: Ambulatory Visit | Attending: Oncology | Admitting: Oncology

## 2013-09-27 DIAGNOSIS — Z853 Personal history of malignant neoplasm of breast: Secondary | ICD-10-CM

## 2014-03-10 ENCOUNTER — Telehealth: Payer: Self-pay

## 2014-03-10 ENCOUNTER — Other Ambulatory Visit: Payer: Self-pay | Admitting: Oncology

## 2014-03-10 NOTE — Telephone Encounter (Signed)
She can come in to see Mendel Ryder in the next 1 -2 weeks

## 2014-03-10 NOTE — Telephone Encounter (Signed)
Pt states her "breasts are itching again like they did before she had the breast cancer and this worries her".  She wants to know if she needs to come in to be checked?

## 2014-03-10 NOTE — Progress Notes (Signed)
Per KK note below, schedule pt to see Raymond in 1-2 weeks.  pof sent    Call Documentation      Deatra Robinson, MD at 03/10/2014  4:17 PM      Status: Signed            She can come in to see Mendel Ryder in the next 1 -2 weeks         Prentiss Bells, RN at 03/10/2014  3:37 PM      Status: Signed            Pt states her "breasts are itching again like they did before she had the breast cancer and this worries her".  She wants to know if she needs to come in to be checked?

## 2014-03-11 ENCOUNTER — Telehealth: Payer: Self-pay | Admitting: Adult Health

## 2014-03-11 NOTE — Telephone Encounter (Signed)
, °

## 2014-03-12 ENCOUNTER — Ambulatory Visit: Payer: Medicare Other | Admitting: Adult Health

## 2014-03-14 ENCOUNTER — Telehealth: Payer: Self-pay | Admitting: Oncology

## 2014-03-14 NOTE — Telephone Encounter (Signed)
, °

## 2014-03-18 ENCOUNTER — Telehealth: Payer: Self-pay | Admitting: Adult Health

## 2014-03-18 ENCOUNTER — Ambulatory Visit: Payer: Medicare Other | Admitting: Adult Health

## 2014-03-18 NOTE — Telephone Encounter (Signed)
, °

## 2014-03-21 ENCOUNTER — Encounter: Payer: Self-pay | Admitting: Adult Health

## 2014-03-21 ENCOUNTER — Ambulatory Visit (HOSPITAL_BASED_OUTPATIENT_CLINIC_OR_DEPARTMENT_OTHER): Payer: Medicare Other | Admitting: Adult Health

## 2014-03-21 VITALS — BP 125/75 | HR 75 | Temp 98.7°F | Resp 18 | Ht 62.0 in | Wt 182.8 lb

## 2014-03-21 DIAGNOSIS — M949 Disorder of cartilage, unspecified: Secondary | ICD-10-CM

## 2014-03-21 DIAGNOSIS — M899 Disorder of bone, unspecified: Secondary | ICD-10-CM

## 2014-03-21 DIAGNOSIS — C50919 Malignant neoplasm of unspecified site of unspecified female breast: Secondary | ICD-10-CM

## 2014-03-21 DIAGNOSIS — C50519 Malignant neoplasm of lower-outer quadrant of unspecified female breast: Secondary | ICD-10-CM

## 2014-03-21 DIAGNOSIS — Z170 Estrogen receptor positive status [ER+]: Secondary | ICD-10-CM

## 2014-03-21 DIAGNOSIS — Z79811 Long term (current) use of aromatase inhibitors: Secondary | ICD-10-CM

## 2014-03-21 NOTE — Progress Notes (Signed)
Hematology and Oncology Follow Up Visit  Morgan Taylor [id number] [birth year] 53 y.o. 03/23/2014 1:19 PM     Principle Diagnosis:Morgan Taylor 53 y.o. 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     Prior Therapy:  #1 2009 patient found a breast mass she went on to have a lumpectomy and radiation therapy. She after radiation she was started on Arimidex 1 mg daily. She was diagnosed originally in [place]. Original tumor size was 6 x 8 mm nuclear grade 2 ER positive.   Current therapy: Arimidex 1mg  daily.  Interim History: Morgan Taylor 53 y.o. female with h/o stage I invasive ductal carcinoma who is here for evaluation.  She is taking Arimidex daily and tolerating it well.   She does have some bone pain that she feels like is new, and describes this as being in her hips and "everywhere", otherwise, she has mild tolerable hot flashes, and denies unintentional weight loss, fevers, chills, or any further concerns.  We reviewed her health maintenance below.    Medications:  Current Outpatient Prescriptions  Medication Sig Dispense Refill  . amitriptyline (ELAVIL) 100 MG tablet Take 100 mg by mouth at bedtime.      Marland Kitchen amLODipine (NORVASC) 10 MG tablet Take 10 mg by mouth every morning.       Marland Kitchen anastrozole (ARIMIDEX) 1 MG tablet TAKE 1 TABLET EVERY DAY  90 tablet  8  . atorvastatin (LIPITOR) 20 MG tablet Take 20 mg by mouth every morning.      . clonazePAM (KLONOPIN) 0.5 MG tablet Take 0.5 mg by mouth 2 (two) times daily as needed for anxiety.      Marland Kitchen HYDROcodone-acetaminophen (NORCO) 10-325 MG per tablet Take 1 tablet by mouth every 6 (six) hours as needed for pain.       Marland Kitchen insulin glargine (LANTUS) 100 UNIT/ML injection Inject 20 Units into the skin at bedtime.       Marland Kitchen levothyroxine (SYNTHROID, LEVOTHROID) 50 MCG tablet Take 50 mcg by mouth daily before breakfast.      . Liraglutide (VICTOZA) 18 MG/3ML SOPN Inject 1.8 mg into the  skin daily.      Marland Kitchen losartan (COZAAR) 100 MG tablet Take 100 mg by mouth every morning.      . metoprolol succinate (TOPROL-XL) 50 MG 24 hr tablet Take 50 mg by mouth every morning. Take with or immediately following a meal.      . omeprazole (PRILOSEC) 20 MG capsule Take 20 mg by mouth every morning.       . pioglitazone (ACTOS) 15 MG tablet Take 15 mg by mouth every morning.      . topiramate (TOPAMAX) 100 MG tablet Take 100 mg by mouth at bedtime.       No current facility-administered medications for this visit.     Allergies:  Allergies  Allergen Reactions  . Sulfonamide Derivatives Hives    Medical History: Past Medical History  Diagnosis Date  . 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 Diabetes mellitus   . Hypertension   . Arthritis     Surgical History:  Past Surgical History  Procedure Laterality Date  . Appendectomy       Review of Systems: A 10 point review of systems was conducted and is otherwise negative except for what is noted above.    Health Maintenance  Mammogram:  09/27/13     Physical Exam: Blood pressure 125/75, pulse 75,  temperature 98.7 F (37.1 C), temperature source Oral, resp. rate 18, height 5\' 2"  (1.575 m), weight 182 lb 12.8 oz (82.918 kg). GENERAL: Patient is a well appearing female in no acute distress HEENT:  Sclerae anicteric.  Oropharynx clear and moist. No ulcerations or evidence of oropharyngeal candidiasis. Neck is supple.  NODES:  No cervical, supraclavicular, or axillary lymphadenopathy palpated.  BREAST EXAM:  Deferred. LUNGS:  Clear to auscultation bilaterally.  No wheezes or rhonchi. HEART:  Regular rate and rhythm. No murmur appreciated. ABDOMEN:  Soft, nontender.  Positive, normoactive bowel sounds. No organomegaly palpated. MSK:  No focal spinal tenderness to palpation. Full range of motion bilaterally in the upper extremities. EXTREMITIES:  No peripheral edema.   SKIN:  Clear with no obvious rashes or skin changes. No nail  dyscrasia. NEURO:  Nonfocal. Well oriented.  Appropriate affect. ECOG PERFORMANCE STATUS: 1 - Symptomatic but completely ambulatory   Lab Results: Lab Results  Component Value Date   WBC 8.7 08/01/2012   HGB 13.7 08/01/2012   HCT 40.9 08/01/2012   MCV 91.8 08/01/2012   PLT 266 08/01/2012     Chemistry      Component Value Date/Time   NA 139 08/01/2012 1306   K 3.8 08/01/2012 1306   CL 99 08/01/2012 1306   CO2 32 08/01/2012 1306   BUN 12 08/01/2012 1306   CREATININE 0.76 08/01/2012 1306      Component Value Date/Time   CALCIUM 9.4 08/01/2012 1306   ALKPHOS 78 08/01/2012 1306   AST 24 08/01/2012 1306   ALT 33 08/01/2012 1306   BILITOT 0.6 08/01/2012 1306        Assessment and Plan: Morgan Taylor 53 y.o. female with  1. 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  She is taking Arimidex daily and is tolerating it well.  Her labs are stable.  I reviewed them with her in detail. I recommended healthy diet, exercise, and monthly self breast exams.    2. Bone pain: I did order a bone scan.  She will return in one month and we will review these results.    The patient will return in 6 months for labs, evaluation, and to discuss stopping Arimidex.    I spent 25 minutes counseling the patient face to face.  The total time spent in the appointment was 30 minutes.  Minette Headland, [place] [phone] 03/23/2014 1:19 PM

## 2014-04-15 ENCOUNTER — Telehealth: Payer: Self-pay | Admitting: *Deleted

## 2014-04-15 ENCOUNTER — Other Ambulatory Visit: Payer: Self-pay | Admitting: Adult Health

## 2014-04-15 DIAGNOSIS — M898X9 Other specified disorders of bone, unspecified site: Secondary | ICD-10-CM

## 2014-04-15 DIAGNOSIS — C50519 Malignant neoplasm of lower-outer quadrant of unspecified female breast: Secondary | ICD-10-CM

## 2014-04-15 NOTE — Telephone Encounter (Signed)
Returned patient call. States she has not been contacted about scheduling a bone scan. She was concerned she had missed a call.

## 2014-04-16 ENCOUNTER — Telehealth: Payer: Self-pay | Admitting: Oncology

## 2014-04-16 NOTE — Telephone Encounter (Signed)
S/w the pt and she is aware of her bone scan appt in April and along with the md visit in may.

## 2014-04-23 ENCOUNTER — Ambulatory Visit (HOSPITAL_COMMUNITY): Payer: Medicare Other

## 2014-04-23 ENCOUNTER — Encounter (HOSPITAL_COMMUNITY): Payer: Medicare Other

## 2014-04-24 ENCOUNTER — Emergency Department (INDEPENDENT_AMBULATORY_CARE_PROVIDER_SITE_OTHER)
Admission: EM | Admit: 2014-04-24 | Discharge: 2014-04-24 | Disposition: A | Discharge status: Home / Self Care | Payer: Medicare Other | Source: Home / Self Care | Attending: Emergency Medicine | Admitting: Emergency Medicine

## 2014-04-24 ENCOUNTER — Encounter (HOSPITAL_COMMUNITY): Payer: Self-pay | Admitting: Emergency Medicine

## 2014-04-24 DIAGNOSIS — K219 Gastro-esophageal reflux disease without esophagitis: Secondary | ICD-10-CM

## 2014-04-24 DIAGNOSIS — R059 Cough, unspecified: Secondary | ICD-10-CM

## 2014-04-24 DIAGNOSIS — R05 Cough: Secondary | ICD-10-CM

## 2014-04-24 MED ORDER — BENZONATATE 100 MG PO CAPS: 100.0000 mg | ORAL_CAPSULE | Freq: Three times a day (TID) | ORAL | Status: DC | PRN

## 2014-04-24 NOTE — Discharge Instructions (Signed)
I would advise that you increase your Prilosec to 40 mg by mouth once a day for 7 days and then return to your 20 mg once a day dose. Use tessalon as prescribed for cough and follow up with your primary care doctor if symptoms do not improve.   Cough, Adult  A cough is a reflex that helps clear your throat and airways. It can help heal the body or may be a reaction to an irritated airway. A cough may only last 2 or 3 weeks (acute) or may last more than 8 weeks (chronic).  CAUSES Acute cough:  Viral or bacterial infections. Chronic cough:  Infections.  Allergies.  Asthma.  Post-nasal drip.  Smoking.  Heartburn or acid reflux.  Some medicines.  Chronic lung problems (COPD).  Cancer. SYMPTOMS   Cough.  Fever.  Chest pain.  Increased breathing rate.  High-pitched whistling sound when breathing (wheezing).  Colored mucus that you cough up (sputum). TREATMENT   A bacterial cough may be treated with antibiotic medicine.  A viral cough must run its course and will not respond to antibiotics.  Your caregiver may recommend other treatments if you have a chronic cough. HOME CARE INSTRUCTIONS   Only take over-the-counter or prescription medicines for pain, discomfort, or fever as directed by your caregiver. Use cough suppressants only as directed by your caregiver.  Use a cold steam vaporizer or humidifier in your bedroom or home to help loosen secretions.  Sleep in a semi-upright position if your cough is worse at night.  Rest as needed.  Stop smoking if you smoke. SEEK IMMEDIATE MEDICAL CARE IF:   You have pus in your sputum.  Your cough starts to worsen.  You cannot control your cough with suppressants and are losing sleep.  You begin coughing up blood.  You have difficulty breathing.  You develop pain which is getting worse or is uncontrolled with medicine.  You have a fever. MAKE SURE YOU:   Understand these instructions.  Will watch your  condition.  Will get help right away if you are not doing well or get worse. Document Released: 06/10/2011 Document Revised: 03/05/2012 Document Reviewed: 06/10/2011 [facility] Patient Information 2014 [place].  Diet for Gastroesophageal Reflux Disease, Adult Reflux (acid reflux) is when acid from your stomach flows up into the esophagus. When acid comes in contact with the esophagus, the acid causes irritation and soreness (inflammation) in the esophagus. When reflux happens often or so severely that it causes damage to the esophagus, it is called gastroesophageal reflux disease (GERD). Nutrition therapy can help ease the discomfort of GERD. FOODS OR DRINKS TO AVOID OR LIMIT  Smoking or chewing tobacco. Nicotine is one of the most potent stimulants to acid production in the gastrointestinal tract.  Caffeinated and decaffeinated coffee and black tea.  Regular or low-calorie carbonated beverages or energy drinks (caffeine-free carbonated beverages are allowed).   Strong spices, such as black pepper, white pepper, red pepper, cayenne, curry powder, and chili powder.  Peppermint or spearmint.  Chocolate.  High-fat foods, including meats and fried foods. Extra added fats including oils, butter, salad dressings, and nuts. Limit these to less than 8 tsp per day.  Fruits and vegetables if they are not tolerated, such as citrus fruits or tomatoes.  Alcohol.  Any food that seems to aggravate your condition. If you have questions regarding your diet, call your caregiver or a registered dietitian. OTHER THINGS THAT MAY HELP GERD INCLUDE:   Eating your meals slowly, in  a relaxed setting.  Eating 5 to 6 small meals per day instead of 3 large meals.  Eliminating food for a period of time if it causes distress.  Not lying down until 3 hours after eating a meal.  Keeping the head of your bed raised 6 to 9 inches (15 to 23 cm) by using a foam wedge or blocks under the legs of the bed.  Lying flat may make symptoms worse.  Being physically active. Weight loss may be helpful in reducing reflux in overweight or obese adults.  Wear loose fitting clothing EXAMPLE MEAL PLAN This meal plan is approximately 2,000 calories based on [email or web address] meal planning guidelines. Breakfast   cup cooked oatmeal.  1 cup strawberries.  1 cup low-fat milk.  1 oz almonds. Snack  1 cup cucumber slices.  6 oz yogurt (made from low-fat or fat-free milk). Lunch  2 slice whole-wheat bread.  2 oz sliced [place].  2 tsp mayonnaise.  1 cup blueberries.  1 cup snap peas. Snack  6 whole-wheat crackers.  1 oz string cheese. Dinner   cup brown rice.  1 cup mixed veggies.  1 tsp olive oil.  3 oz grilled fish. Document Released: 12/12/2005 Document Revised: 03/05/2012 Document Reviewed: 10/28/2011 [facility] Patient Information 2014 [place], [place].  Gastroesophageal Reflux Disease, Adult Gastroesophageal reflux disease (GERD) happens when acid from your stomach flows up into the esophagus. When acid comes in contact with the esophagus, the acid causes soreness (inflammation) in the esophagus. Over time, GERD may create small holes (ulcers) in the lining of the esophagus. CAUSES   Increased body weight. This puts pressure on the stomach, making acid rise from the stomach into the esophagus.  Smoking. This increases acid production in the stomach.  Drinking alcohol. This causes decreased pressure in the lower esophageal sphincter (valve or ring of muscle between the esophagus and stomach), allowing acid from the stomach into the esophagus.  Late evening meals and a full stomach. This increases pressure and acid production in the stomach.  A malformed lower esophageal sphincter. Sometimes, no cause is found. SYMPTOMS   Burning pain in the lower part of the mid-chest behind the breastbone and in the mid-stomach area. This may occur twice a week or more  often.  Trouble swallowing.  Sore throat.  Dry cough.  Asthma-like symptoms including chest tightness, shortness of breath, or wheezing. DIAGNOSIS  Your caregiver may be able to diagnose GERD based on your symptoms. In some cases, X-rays and other tests may be done to check for complications or to check the condition of your stomach and esophagus. TREATMENT  Your caregiver may recommend over-the-counter or prescription medicines to help decrease acid production. Ask your caregiver before starting or adding any new medicines.  HOME CARE INSTRUCTIONS   Change the factors that you can control. Ask your caregiver for guidance concerning weight loss, quitting smoking, and alcohol consumption.  Avoid foods and drinks that make your symptoms worse, such as:  Caffeine or alcoholic drinks.  Chocolate.  Peppermint or mint flavorings.  Garlic and onions.  Spicy foods.  Citrus fruits, such as oranges, lemons, or limes.  Tomato-based foods such as sauce, chili, salsa, and pizza.  Fried and fatty foods.  Avoid lying down for the 3 hours prior to your bedtime or prior to taking a nap.  Eat small, frequent meals instead of large meals.  Wear loose-fitting clothing. Do not wear anything tight around your waist that causes pressure on your stomach.  Raise the head  of your bed 6 to 8 inches with wood blocks to help you sleep. Extra pillows will not help.  Only take over-the-counter or prescription medicines for pain, discomfort, or fever as directed by your caregiver.  Do not take aspirin, ibuprofen, or other nonsteroidal anti-inflammatory drugs (NSAIDs). SEEK IMMEDIATE MEDICAL CARE IF:   You have pain in your arms, neck, jaw, teeth, or back.  Your pain increases or changes in intensity or duration.  You develop nausea, vomiting, or sweating (diaphoresis).  You develop shortness of breath, or you faint.  Your vomit is green, yellow, black, or looks like coffee grounds or  blood.  Your stool is red, bloody, or black. These symptoms could be signs of other problems, such as heart disease, gastric bleeding, or esophageal bleeding. MAKE SURE YOU:   Understand these instructions.  Will watch your condition.  Will get help right away if you are not doing well or get worse. Document Released: 09/21/2005 Document Revised: 03/05/2012 Document Reviewed: 07/01/2011 [facility] Patient Information 2014 [place], [place].

## 2014-04-24 NOTE — ED Provider Notes (Signed)
CSN: [id number]     Arrival date & time 04/24/14  1922 History   First MD Initiated Contact with Patient 04/24/14 2057     Chief Complaint  Patient presents with  . Cough   (Consider location/radiation/quality/duration/timing/severity/associated sxs/prior Treatment) HPI Comments: Also describes occasional burning sensation in chest prior to coughing. Hx of GERD. No fever or dyspnea. No chest pain. No hemoptysis.  PCP: Dr. Normajean Baxter  Patient is a 53 y.o. female presenting with cough. The history is provided by the patient.  Cough Cough characteristics:  Dry and hacking Severity:  Moderate Onset quality:  Gradual Timing: worse at night when she lies down. Progression:  Unchanged Chronicity:  New Smoker: no   Relieved by:  Nothing Ineffective treatments: NyQuil, DayQuil and cough drops. Associated symptoms: headaches and sore throat   Associated symptoms: no chest pain, no chills, no diaphoresis, no ear fullness, no ear pain, no eye discharge, no fever, no myalgias, no rash, no rhinorrhea, no shortness of breath, no sinus congestion, no weight loss and no wheezing   Associated symptoms comment:  +few episodes of diarrhea   Past Medical History  Diagnosis Date  . 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 Diabetes mellitus   . Hypertension   . Arthritis    Past Surgical History  Procedure Laterality Date  . Appendectomy     No family history on file. History  Substance Use Topics  . Smoking status: Never Smoker   . Smokeless tobacco: Never Used  . Alcohol Use: No   OB History   Grav Para Term Preterm Abortions TAB SAB Ect Mult Living                 Review of Systems  Constitutional: Negative for fever, chills, weight loss and diaphoresis.  HENT: Positive for sore throat. Negative for ear pain, postnasal drip and rhinorrhea.   Eyes: Negative for discharge.  Respiratory: Positive for cough. Negative for chest tightness, shortness of breath and wheezing.   Cardiovascular: Negative for  chest pain.  Gastrointestinal: Negative.   Genitourinary: Negative.   Musculoskeletal: Negative for back pain and myalgias.  Skin: Negative for rash.  Neurological: Positive for headaches. Negative for dizziness and weakness.  All other systems reviewed and are negative.   Allergies  Sulfonamide derivatives  Home Medications   Prior to Admission medications   Medication Sig Start Date End Date Taking? Authorizing Provider  amLODipine (NORVASC) 10 MG tablet Take 10 mg by mouth every morning.    Yes Historical Provider, MD  anastrozole (ARIMIDEX) 1 MG tablet TAKE 1 TABLET EVERY DAY 03/10/14  Yes Deatra Robinson, MD  atorvastatin (LIPITOR) 20 MG tablet Take 20 mg by mouth every morning.   Yes Historical Provider, MD  clonazePAM (KLONOPIN) 0.5 MG tablet Take 0.5 mg by mouth 2 (two) times daily as needed for anxiety.   Yes Historical Provider, MD  insulin glargine (LANTUS) 100 UNIT/ML injection Inject 20 Units into the skin at bedtime.    Yes Historical Provider, MD  Liraglutide (VICTOZA) 18 MG/3ML SOPN Inject 1.8 mg into the skin daily.   Yes Historical Provider, MD  losartan (COZAAR) 100 MG tablet Take 100 mg by mouth every morning.   Yes Historical Provider, MD  metoprolol succinate (TOPROL-XL) 50 MG 24 hr tablet Take 50 mg by mouth every morning. Take with or immediately following a meal.   Yes Historical Provider, MD  omeprazole (PRILOSEC) 20 MG capsule Take 20 mg by mouth every morning.  Yes Historical Provider, MD  pioglitazone (ACTOS) 15 MG tablet Take 15 mg by mouth every morning.   Yes Historical Provider, MD  topiramate (TOPAMAX) 100 MG tablet Take 100 mg by mouth at bedtime.   Yes Historical Provider, MD  amitriptyline (ELAVIL) 100 MG tablet Take 100 mg by mouth at bedtime.    Historical Provider, MD  HYDROcodone-acetaminophen (NORCO) 10-325 MG per tablet Take 1 tablet by mouth every 6 (six) hours as needed for pain.     Historical Provider, MD  levothyroxine (SYNTHROID,  LEVOTHROID) 50 MCG tablet Take 50 mcg by mouth daily before breakfast.    Historical Provider, MD   BP 117/72  Pulse 64  Temp(Src) 98.2 F (36.8 C) (Oral)  Resp 16  SpO2 95% Physical Exam  Nursing note and vitals reviewed. Constitutional: She is oriented to person, place, and time. She appears well-developed and well-nourished. No distress.  HENT:  Head: Normocephalic and atraumatic.  Right Ear: Hearing, tympanic membrane, external ear and ear canal normal.  Left Ear: Hearing, tympanic membrane, external ear and ear canal normal.  Nose: Nose normal.  Mouth/Throat: Uvula is midline, oropharynx is clear and moist and mucous membranes are normal. No oropharyngeal exudate.  Eyes: Conjunctivae are normal. Right eye exhibits no discharge. Left eye exhibits no discharge. No scleral icterus.  Neck: Normal range of motion. Neck supple.  Cardiovascular: Normal rate, regular rhythm and normal heart sounds.   Pulmonary/Chest: Effort normal and breath sounds normal. No respiratory distress. She has no wheezes.  Abdominal: Soft. Bowel sounds are normal. She exhibits no distension. There is no tenderness.  Musculoskeletal: Normal range of motion.  Neurological: She is alert and oriented to person, place, and time.  Skin: Skin is warm and dry.  Psychiatric: She has a normal mood and affect. Her behavior is normal.    ED Course  Procedures (including critical care time) Labs Review Labs Reviewed - No data to display  Imaging Review No results found.   MDM   1. GERD (gastroesophageal reflux disease)   2. Cough    Will advise patient to increase prilosec to 40 mg po QD x 7 days then return to 20mg  po QD dose. Will add tessalon for cough and advise follow up with PCP if no improvement. Exam unremarkable.    [place], [place] 04/24/14 2141

## 2014-04-24 NOTE — ED Notes (Signed)
Pt c/o productive cough onset 3 days Sx include: HA, nausea, chest tightness Denies f/v/d, SOB, wheezing Taking OTC cold meds w/no relief Alert w/no signs of acute distress.

## 2014-04-25 ENCOUNTER — Encounter: Payer: Self-pay | Admitting: Gastroenterology

## 2014-04-25 ENCOUNTER — Telehealth: Payer: Self-pay | Admitting: Adult Health

## 2014-04-25 NOTE — ED Provider Notes (Signed)
Medical screening examination/treatment/procedure(s) were performed by non-physician practitioner and as supervising physician I was immediately available for consultation/collaboration.  Philipp Deputy, M.D.  Harden Mo, MD 04/25/14 [phone]

## 2014-04-28 ENCOUNTER — Ambulatory Visit: Payer: Medicare Other | Admitting: Adult Health

## 2014-04-29 ENCOUNTER — Ambulatory Visit (HOSPITAL_COMMUNITY)
Admission: RE | Admit: 2014-04-29 | Discharge: 2014-04-29 | Disposition: A | Discharge status: Home / Self Care | Payer: Medicare Other | Source: Ambulatory Visit | Attending: Adult Health | Admitting: Adult Health

## 2014-04-29 ENCOUNTER — Encounter (HOSPITAL_COMMUNITY)
Admission: RE | Admit: 2014-04-29 | Discharge: 2014-04-29 | Disposition: A | Discharge status: Home / Self Care | Payer: Medicare Other | Source: Ambulatory Visit | Attending: Adult Health | Admitting: Adult Health

## 2014-04-29 DIAGNOSIS — M25519 Pain in unspecified shoulder: Secondary | ICD-10-CM | POA: Insufficient documentation

## 2014-04-29 DIAGNOSIS — M25569 Pain in unspecified knee: Secondary | ICD-10-CM | POA: Insufficient documentation

## 2014-04-29 DIAGNOSIS — M898X9 Other specified disorders of bone, unspecified site: Secondary | ICD-10-CM

## 2014-04-29 DIAGNOSIS — C50519 Malignant neoplasm of lower-outer quadrant of unspecified female breast: Secondary | ICD-10-CM | POA: Insufficient documentation

## 2014-04-29 MED ORDER — TECHNETIUM TC 99M MEDRONATE IV KIT: 25.0000 | PACK | Freq: Once | INTRAVENOUS | Status: DC | PRN

## 2014-05-05 ENCOUNTER — Encounter: Payer: Self-pay | Admitting: Adult Health

## 2014-05-05 ENCOUNTER — Ambulatory Visit (HOSPITAL_BASED_OUTPATIENT_CLINIC_OR_DEPARTMENT_OTHER): Payer: Medicare Other | Admitting: Adult Health

## 2014-05-05 VITALS — BP 134/74 | HR 73 | Temp 98.4°F | Resp 18 | Ht 62.0 in | Wt 178.5 lb

## 2014-05-05 DIAGNOSIS — C50519 Malignant neoplasm of lower-outer quadrant of unspecified female breast: Secondary | ICD-10-CM

## 2014-05-05 DIAGNOSIS — N61 Mastitis without abscess: Secondary | ICD-10-CM

## 2014-05-05 DIAGNOSIS — Z170 Estrogen receptor positive status [ER+]: Secondary | ICD-10-CM

## 2014-05-05 DIAGNOSIS — N6459 Other signs and symptoms in breast: Secondary | ICD-10-CM

## 2014-05-05 MED ORDER — CEPHALEXIN 500 MG PO CAPS: 500.0000 mg | ORAL_CAPSULE | Freq: Three times a day (TID) | ORAL | Status: DC

## 2014-05-05 NOTE — Progress Notes (Signed)
Hematology and Oncology Follow Up Visit  Morgan Taylor [id number] 1961/08/24 53 y.o. 05/05/2014 5:46 PM     Principal Diagnosis:Morgan Taylor 53 y.o. 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     Prior Therapy:  #1 2009 patient found a breast mass she went on to have a lumpectomy and radiation therapy. She after radiation she was started on Arimidex 1 mg daily. She was diagnosed originally in [place]. Original tumor size was 6 x 8 mm nuclear grade 2 ER positive.   Current therapy: Arimidex 1mg  daily.  Interim History: Morgan Taylor 53 y.o. female with h/o stage I invasive ductal carcinoma who is here for evaluation.  She is taking Arimidex daily and tolerating it well.  She did have significant bone pain at a recent appointment and a bone scan was done.  She is here today to discuss the results.  She is doing well today otherwise, and continues to take arimidex daily and tolerate it well.  She does however have a breast lesion that is slightly painful that she is concerned about it.  She tells me that she thinks it is a week old, but she's just not sure.  She denies fevers, chills, nipple dischage, increased redness, or any increased breast pain.  Otherwise, she is doing well, and a 10 point ROS is neg.    Medications:  Current Outpatient Prescriptions  Medication Sig Dispense Refill  . amLODipine (NORVASC) 10 MG tablet Take 10 mg by mouth every morning.       Marland Kitchen anastrozole (ARIMIDEX) 1 MG tablet TAKE 1 TABLET EVERY DAY  90 tablet  8  . atorvastatin (LIPITOR) 20 MG tablet Take 20 mg by mouth every morning.      . clonazePAM (KLONOPIN) 0.5 MG tablet Take 0.5 mg by mouth 2 (two) times daily as needed for anxiety.      Marland Kitchen HYDROcodone-acetaminophen (NORCO) 10-325 MG per tablet Take 1 tablet by mouth every 6 (six) hours as needed for pain.       Marland Kitchen insulin glargine (LANTUS) 100 UNIT/ML injection Inject 20 Units into the skin at  bedtime.       Marland Kitchen levothyroxine (SYNTHROID, LEVOTHROID) 50 MCG tablet Take 50 mcg by mouth daily before breakfast.      . Liraglutide (VICTOZA) 18 MG/3ML SOPN Inject 1.8 mg into the skin daily.      Marland Kitchen losartan (COZAAR) 100 MG tablet Take 100 mg by mouth every morning.      . metoprolol succinate (TOPROL-XL) 50 MG 24 hr tablet Take 50 mg by mouth every morning. Take with or immediately following a meal.      . omeprazole (PRILOSEC) 20 MG capsule Take 20 mg by mouth every morning.       . pioglitazone (ACTOS) 15 MG tablet Take 15 mg by mouth every morning.      . topiramate (TOPAMAX) 100 MG tablet Take 100 mg by mouth at bedtime.      . cephALEXin (KEFLEX) 500 MG capsule Take 1 capsule (500 mg total) by mouth 3 (three) times daily.  21 capsule  0   No current facility-administered medications for this visit.     Allergies:  Allergies  Allergen Reactions  . Sulfonamide Derivatives Hives    Medical History: Past Medical History  Diagnosis Date  . 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 Diabetes mellitus   . Hypertension   . Arthritis  Surgical History:  Past Surgical History  Procedure Laterality Date  . Appendectomy       Review of Systems: A 10 point review of systems was conducted and is otherwise negative except for what is noted above.    Physical Exam: Blood pressure 134/74, pulse 73, temperature 98.4 F (36.9 C), temperature source Oral, resp. rate 18, height 5\' 2"  (1.575 m), weight 178 lb 8 oz (80.967 kg). GENERAL: Patient is a well appearing female in no acute distress HEENT:  Sclerae anicteric.  Oropharynx clear and moist. No ulcerations or evidence of oropharyngeal candidiasis. Neck is supple.  NODES:  No cervical, supraclavicular, or axillary lymphadenopathy palpated.  BREAST EXAM:  Left breast with small 1-2 cm scabbed oval, appears similar to teeth marks LUNGS:  Clear to auscultation bilaterally.  No wheezes or rhonchi. HEART:  Regular rate and rhythm. No murmur  appreciated. ABDOMEN:  Soft, nontender.  Positive, normoactive bowel sounds. No organomegaly palpated. MSK:  No focal spinal tenderness to palpation. Full range of motion bilaterally in the upper extremities. EXTREMITIES:  No peripheral edema.   SKIN:  Clear with no obvious rashes or skin changes. No nail dyscrasia. NEURO:  Nonfocal. Well oriented.  Appropriate affect. ECOG PERFORMANCE STATUS: 1 - Symptomatic but completely ambulatory   Lab Results: Lab Results  Component Value Date   WBC 8.7 08/01/2012   HGB 13.7 08/01/2012   HCT 40.9 08/01/2012   MCV 91.8 08/01/2012   PLT 266 08/01/2012     Chemistry      Component Value Date/Time   NA 139 08/01/2012 1306   K 3.8 08/01/2012 1306   CL 99 08/01/2012 1306   CO2 32 08/01/2012 1306   BUN 12 08/01/2012 1306   CREATININE 0.76 08/01/2012 1306      Component Value Date/Time   CALCIUM 9.4 08/01/2012 1306   ALKPHOS 78 08/01/2012 1306   AST 24 08/01/2012 1306   ALT 33 08/01/2012 1306   BILITOT 0.6 08/01/2012 1306       Radiological Studies: CLINICAL DATA: Left shoulder and bilateral knee pain. History of  breast cancer.  EXAM:  NUCLEAR MEDICINE WHOLE BODY BONE SCAN  TECHNIQUE:  Whole body anterior and posterior images were obtained approximately  3 hours after intravenous injection of radiopharmaceutical.  RADIOPHARMACEUTICALS: 25.2 mCi of Technetium-99 MDP  COMPARISON: None  FINDINGS:  There is increased activity at the left acromioclavicular joint and  left glenohumeral joint. There is increased activity at both knees  particularly in the medial compartments, bilaterally and  symmetrically. There is also increased activity in the talonavicular  joint of the left foot.  There is focal increased activity at L3-4 to the right of midline  which probably represents degenerative disc disease.  The findings are not suggestive of metastatic disease.  IMPRESSION:  Findings consistent with arthritis of the left shoulder and both  knees and in the left mid  foot. Probable degenerative disc disease  at L3-4.  Electronically Signed  By: Rozetta Nunnery M.D.  On: 04/29/2014 14:38   Assessment and Plan: Morgan Taylor 53 y.o. female with  1. 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  The patient is taking Arimidex daily and tolerating it well.  She did have a bone scan that was negative for metastatic disease.  She will continue Arimidex daily.    2.  Breast lesion:  The lesion appears as scabs, and there is some erythema.  Due to the tenderness, I prescribed  Keflex TID.  I reviewed this medication with the patient in detail.  She will return in two weeks and I will re-evaluate her breast.    The patient will return in two weeks, and in 6 months for labs and evaluation.   She knows to call us in the interim for any questions or concerns.  We can certainly see her sooner if needed.  I spent 25 minutes counseling the patient face to face.  The total time spent in the appointment was 30 minutes.  Minette Headland, [place] [phone] 05/05/2014 5:46 PM

## 2014-05-21 ENCOUNTER — Ambulatory Visit (AMBULATORY_SURGERY_CENTER): Payer: Medicare Other

## 2014-05-21 VITALS — Ht 62.5 in | Wt 175.4 lb

## 2014-05-21 DIAGNOSIS — Z1211 Encounter for screening for malignant neoplasm of colon: Secondary | ICD-10-CM

## 2014-05-21 MED ORDER — MOVIPREP 100 G PO SOLR: 1.0000 | Freq: Once | ORAL | Status: DC

## 2014-05-21 NOTE — Progress Notes (Signed)
No allergies to eggs or soy No home oxygen No diet/weight loss meds  No past problems with anesthesia Has email  Emmi instructions for colonoscopy

## 2014-05-23 ENCOUNTER — Telehealth: Payer: Self-pay | Admitting: Gastroenterology

## 2014-05-23 NOTE — Telephone Encounter (Signed)
EMMI education assisgned to patient on colonoscopy, used the correct e-mail address per patient. Notified patient, access code given to pt.  # [id number]. Patient verbalizes understanding.

## 2014-06-04 ENCOUNTER — Encounter: Payer: Self-pay | Admitting: Gastroenterology

## 2014-06-04 ENCOUNTER — Ambulatory Visit (AMBULATORY_SURGERY_CENTER): Payer: Medicare Other | Admitting: Gastroenterology

## 2014-06-04 VITALS — BP 142/77 | HR 74 | Temp 98.1°F | Resp 15 | Ht 62.5 in | Wt 175.0 lb

## 2014-06-04 DIAGNOSIS — Z1211 Encounter for screening for malignant neoplasm of colon: Secondary | ICD-10-CM

## 2014-06-04 LAB — GLUCOSE, CAPILLARY
Glucose-Capillary: 134 mg/dL — ABNORMAL HIGH (ref 70–99)
Glucose-Capillary: 118 mg/dL — ABNORMAL HIGH (ref 70–99)

## 2014-06-04 MED ORDER — SODIUM CHLORIDE 0.9 % IV SOLN
500.0000 mL | INTRAVENOUS | Status: DC
Start: 2014-06-04 — End: 2014-06-05

## 2014-06-04 NOTE — Patient Instructions (Signed)

## 2014-06-04 NOTE — Op Note (Signed)
[place]  [address]. [place], [address]   COLONOSCOPY PROCEDURE REPORT  PATIENT: Morgan Taylor, Morgan Taylor  MR#: [id number] BIRTHDATE: Jul 04, 1961 , 52  yrs. old GENDER: Female ENDOSCOPIST: Milus Banister, MD PROCEDURE DATE:  06/04/2014 PROCEDURE:   Colonoscopy, screening First Screening Colonoscopy - Avg.  risk and is 50 yrs.  old or older Yes.  Prior Negative Screening - Now for repeat screening. N/A  History of Adenoma - Now for follow-up colonoscopy & has been > or = to 3 yrs.  N/A  Polyps Removed Today? No.  Recommend repeat exam, <10 yrs? No. ASA CLASS:   Class II INDICATIONS:average risk screening. MEDICATIONS: MAC sedation, administered by CRNA and propofol (Diprivan) 300mg  IV  DESCRIPTION OF PROCEDURE:   After the risks benefits and alternatives of the procedure were thoroughly explained, informed consent was obtained.  A digital rectal exam revealed no abnormalities of the rectum.   The LB [id number]  endoscope was introduced through the anus and advanced to the cecum, which was identified by both the appendix and ileocecal valve. No adverse events experienced.   The quality of the prep was excellent.  The instrument was then slowly withdrawn as the colon was fully examined.   COLON FINDINGS: A normal appearing cecum, ileocecal valve, and appendiceal orifice were identified.  The ascending, hepatic flexure, transverse, splenic flexure, descending, sigmoid colon and rectum appeared unremarkable.  No polyps or cancers were seen. Retroflexed views revealed no abnormalities. The time to cecum=3 minutes 52 seconds.  Withdrawal time=8 minutes 22 seconds.  The scope was withdrawn and the procedure completed. COMPLICATIONS: There were no complications.  ENDOSCOPIC IMPRESSION: Normal colon No polyps or cancers  RECOMMENDATIONS: You should continue to follow colorectal cancer screening guidelines for "routine risk" patients with a repeat colonoscopy in  10 years. There is no need for FOBT (stool) testing for at least 5 years.   eSigned:  Milus Banister, MD 06/04/2014 11:29 AM

## 2014-06-05 ENCOUNTER — Telehealth: Payer: Self-pay

## 2014-06-05 NOTE — Telephone Encounter (Signed)
  Follow up Call-  Call back number 06/04/2014  Post procedure Call Back phone  # [phone]  Permission to leave phone message Yes     Patient questions:  Do you have a fever, pain , or abdominal swelling? no Pain Score  0 *  Have you tolerated food without any problems? yes  Have you been able to return to your normal activities? yes  Do you have any questions about your discharge instructions: Diet   no Medications  no Follow up visit  no  Do you have questions or concerns about your Care? no  Actions: * If pain score is 4 or above: No action needed, pain <4.

## 2014-07-28 ENCOUNTER — Other Ambulatory Visit: Payer: Self-pay | Admitting: Physician Assistant

## 2014-07-28 NOTE — H&P (Signed)
TOTAL KNEE ADMISSION H&P  Patient is being admitted for right total knee arthroplasty.  Subjective:  Chief Complaint:bilaterally knee pain.  HPI: Morgan Taylor, 53 y.o. female, has a history of pain and functional disability in the right knee due to arthritis and has failed non-surgical conservative treatments for greater than 12 weeks to includeNSAID's and/or analgesics, corticosteriod injections, use of assistive devices, weight reduction as appropriate and activity modification.  Onset of symptoms was gradual, starting 6 years ago with gradually worsening course since that time. The patient noted no past surgery on the right knee(s).  Patient currently rates pain in the bilaterally knee(s) at 10 out of 10 with activity. Patient has night pain, worsening of pain with activity and weight bearing, pain that interferes with activities of daily living, pain with passive range of motion, crepitus and joint swelling.  Patient has evidence of periarticular osteophytes and joint space narrowing by imaging studies. There is no active infection.  Patient Active Problem List   Diagnosis Date Noted  . Cancer of lower-outer quadrant of female breast 01/23/2012  . SNORING 05/03/2007  . HYPERGLYCEMIA 05/03/2007  . PHARYNGITIS, ACUTE 02/22/2007  . HYPERPLASIA, ENDOMETRIAL NOS 02/22/2007  . DYSMENORRHEA 10/18/2006  . DEGENERATIVE JOINT DISEASE 10/18/2006  . INSOMNIA 10/18/2006  . CHEST PAIN 10/18/2006  . OBESITY 10/05/2006  . ANXIETY 10/05/2006  . HYPERTENSION 10/05/2006  . GASTROENTERITIS 10/05/2006  . VAGINAL BLEEDING 10/05/2006  . MENORRHAGIA, PERIMENOPAUSAL 10/05/2006  . 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 Diabetes mellitus   . Hypertension   . Arthritis     Past Surgical History  Procedure Laterality Date  . Appendectomy    . Breast lumpectomy    . Shoulder arthroscopy      cleaned out joint  . Cesarean section     . Dilation and curettage of uterus       (Not in a hospital admission) Allergies  Allergen Reactions  . Sulfonamide Derivatives Hives    History  Substance Use Topics  . Smoking status: Never Smoker   . Smokeless tobacco: Never Used  . Alcohol Use: No    Family History  Problem Relation Age of Onset  . Colon cancer Neg Hx   . Pancreatic cancer Neg Hx   . Rectal cancer Neg Hx   . Stomach cancer Neg Hx      Review of Systems  Constitutional: Positive for weight loss. Negative for fever, chills, malaise/fatigue and diaphoresis.  HENT: Negative.   Eyes: Negative.   Respiratory: Negative.   Cardiovascular: Negative.   Gastrointestinal: Negative.   Genitourinary: Negative.   Musculoskeletal: Positive for joint pain and myalgias. Negative for falls.  Skin: Negative.   Neurological: Negative.  Negative for weakness.  Endo/Heme/Allergies: Negative.   Psychiatric/Behavioral: Negative.     Objective:  Physical Exam  Constitutional: She is oriented to person, place, and time. She appears well-developed and well-nourished. No distress.  HENT:  Head: Normocephalic and atraumatic.  Nose: Nose normal.  Eyes: Conjunctivae and EOM are normal. Pupils are equal, round, and reactive to light.  Neck: Normal range of motion. Neck supple.  Cardiovascular: Normal rate, regular rhythm, normal heart sounds and intact distal pulses.   Respiratory: Effort normal and breath sounds normal. No respiratory distress. She has no wheezes.  GI: Soft. Bowel sounds are normal. She exhibits no distension. There is no tenderness.  Musculoskeletal:       Right knee:  She exhibits decreased range of motion, swelling, effusion and bony tenderness. She exhibits no LCL laxity and no MCL laxity. Tenderness found.  Lymphadenopathy:    She has no cervical adenopathy.  Neurological: She is alert and oriented to person, place, and time. No cranial nerve deficit.  Skin: Skin is warm and dry. No rash noted. No  erythema. No pallor.  Psychiatric: She has a normal mood and affect. Her behavior is normal.    Vital signs in last 24 hours: @VSRANGES @  Labs:   Estimated body mass index is 31.48 kg/(m^2) as calculated from the following:   Height as of 06/04/14: 5' 2.5" (1.588 m).   Weight as of 06/04/14: 79.379 kg (175 lb).   Imaging Review Plain radiographs demonstrate severe degenerative joint disease of the bilaterally knee(s). The overall alignment issignificant varus. The bone quality appears to be good for age and reported activity level.  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 The treatment options including medical management, injection therapy arthroscopy and arthroplasty were discussed at length. The risks and benefits of total knee arthroplasty were presented and reviewed. The risks due to aseptic loosening, infection, stiffness, patella tracking problems, thromboembolic complications and other imponderables were discussed. The patient acknowledged the explanation, agreed to proceed with the plan and consent was signed. Patient is being admitted for inpatient treatment for surgery, pain control, PT, OT, prophylactic antibiotics, VTE prophylaxis, progressive ambulation and ADL's and discharge planning. The patient is planning to be discharged home with home health services

## 2014-08-04 ENCOUNTER — Encounter (HOSPITAL_COMMUNITY): Payer: Self-pay | Admitting: Pharmacy Technician

## 2014-08-05 ENCOUNTER — Ambulatory Visit: Payer: Self-pay | Admitting: Physician Assistant

## 2014-08-05 NOTE — H&P (Signed)
TOTAL KNEE ADMISSION H&P  Patient is being admitted for right total knee arthroplasty.  Subjective:  Chief Complaint:right knee pain.  HPI: Morgan [place], 53 y.o. female, has a history of pain and functional disability in the right knee due to arthritis and has failed non-surgical conservative treatments for greater than 12 weeks to includeNSAID's and/or analgesics, corticosteriod injections, weight reduction as appropriate and activity modification.  Onset of symptoms was gradual, starting 10 years ago with gradually worsening course since that time. The patient noted no past surgery on the right knee(s).  Patient currently rates pain in the right knee(s) at 10 out of 10 with activity. Patient has night pain, worsening of pain with activity and weight bearing, pain that interferes with activities of daily living, pain with passive range of motion, crepitus and joint swelling.  Patient has evidence of periarticular osteophytes and joint space narrowing by imaging studies.There is no active infection.  Patient Active Problem List   Diagnosis Date Noted  . Cancer of lower-outer quadrant of female breast 01/23/2012  . SNORING 05/03/2007  . HYPERGLYCEMIA 05/03/2007  . PHARYNGITIS, ACUTE 02/22/2007  . HYPERPLASIA, ENDOMETRIAL NOS 02/22/2007  . DYSMENORRHEA 10/18/2006  . DEGENERATIVE JOINT DISEASE 10/18/2006  . INSOMNIA 10/18/2006  . CHEST PAIN 10/18/2006  . OBESITY 10/05/2006  . ANXIETY 10/05/2006  . HYPERTENSION 10/05/2006  . GASTROENTERITIS 10/05/2006  . VAGINAL BLEEDING 10/05/2006  . MENORRHAGIA, PERIMENOPAUSAL 10/05/2006  . 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 Diabetes mellitus   . Hypertension   . Arthritis     Past Surgical History  Procedure Laterality Date  . Appendectomy    . Breast lumpectomy    . Shoulder arthroscopy      cleaned out joint  . Cesarean section    . Dilation and curettage of uterus        (Not in a hospital admission) Allergies  Allergen Reactions  . Sulfonamide Derivatives Hives    History  Substance Use Topics  . Smoking status: Never Smoker   . Smokeless tobacco: Never Used  . Alcohol Use: No    Family History  Problem Relation Age of Onset  . Colon cancer Neg Hx   . Pancreatic cancer Neg Hx   . Rectal cancer Neg Hx   . Stomach cancer Neg Hx      Review of Systems  Constitutional: Positive for weight loss. Negative for fever, chills, malaise/fatigue and diaphoresis.  HENT: Negative.   Eyes: Negative.   Respiratory: Negative.   Cardiovascular: Negative.   Gastrointestinal: Negative.   Genitourinary: Negative.   Musculoskeletal: Positive for joint pain. Negative for falls.  Skin: Negative.   Neurological: Negative.  Negative for weakness.  Endo/Heme/Allergies: Negative.   Psychiatric/Behavioral: Negative.     Objective:  Physical Exam  Constitutional: She is oriented to person, place, and time. She appears well-developed and well-nourished. No distress.  HENT:  Head: Normocephalic and atraumatic.  Nose: Nose normal.  Eyes: Conjunctivae and EOM are normal. Pupils are equal, round, and reactive to light.  Neck: Normal range of motion. Neck supple.  Cardiovascular: Normal rate, regular rhythm, normal heart sounds and intact distal pulses.   Respiratory: Effort normal and breath sounds normal. No respiratory distress. She has no wheezes.  GI: Soft. Bowel sounds are normal. She exhibits no distension. There is no tenderness.  Musculoskeletal:       Right knee: She exhibits decreased range of motion and  swelling. She exhibits no LCL laxity and no MCL laxity. Tenderness found.  Lymphadenopathy:    She has no cervical adenopathy.  Neurological: She is alert and oriented to person, place, and time. No cranial nerve deficit.  Skin: Skin is warm and dry. No rash noted. No erythema.  Psychiatric: She has a normal mood and affect. Her behavior is normal.     Vital signs in last 24 hours: @VSRANGES @  Labs:   Estimated body mass index is 31.48 kg/(m^2) as calculated from the following:   Height as of 06/04/14: 5' 2.5" (1.588 m).   Weight as of 06/04/14: 79.379 kg (175 lb).   Imaging Review Plain radiographs demonstrate severe degenerative joint disease of the right knee(s). The overall alignment issignificant varus. The bone quality appears to be good for age and reported activity level.  Assessment/Plan:  End stage arthritis, right knee   The patient history, physical examination, clinical judgment of the provider and imaging studies are consistent with end stage degenerative joint disease of the right knee(s) and total knee arthroplasty is deemed medically necessary. The treatment options including medical management, injection therapy arthroscopy and arthroplasty were discussed at length. The risks and benefits of total knee arthroplasty were presented and reviewed. The risks due to aseptic loosening, infection, stiffness, patella tracking problems, thromboembolic complications and other imponderables were discussed. The patient acknowledged the explanation, agreed to proceed with the plan and consent was signed. Patient is being admitted for inpatient treatment for surgery, pain control, PT, OT, prophylactic antibiotics, VTE prophylaxis, progressive ambulation and ADL's and discharge planning. The patient is planning to be discharged home with home health services

## 2014-08-07 ENCOUNTER — Ambulatory Visit (HOSPITAL_COMMUNITY)
Admission: RE | Admit: 2014-08-07 | Discharge: 2014-08-07 | Disposition: A | Discharge status: Home / Self Care | Payer: Medicare Other | Source: Ambulatory Visit | Attending: Physician Assistant | Admitting: Physician Assistant

## 2014-08-07 ENCOUNTER — Encounter (HOSPITAL_COMMUNITY)
Admission: RE | Admit: 2014-08-07 | Discharge: 2014-08-07 | Disposition: A | Discharge status: Home / Self Care | Payer: Medicare Other | Source: Ambulatory Visit | Attending: Orthopedic Surgery | Admitting: Orthopedic Surgery

## 2014-08-07 ENCOUNTER — Encounter (HOSPITAL_COMMUNITY): Payer: Self-pay

## 2014-08-07 DIAGNOSIS — Z01818 Encounter for other preprocedural examination: Secondary | ICD-10-CM | POA: Insufficient documentation

## 2014-08-07 HISTORY — DX: Hyperlipidemia, unspecified: E78.5

## 2014-08-07 HISTORY — DX: Gastro-esophageal reflux disease without esophagitis: K21.9

## 2014-08-07 HISTORY — DX: Anxiety disorder, unspecified: F41.9

## 2014-08-07 HISTORY — DX: Depression, unspecified: F32.A

## 2014-08-07 HISTORY — DX: Hypothyroidism, unspecified: E03.9

## 2014-08-07 HISTORY — DX: Major depressive disorder, single episode, unspecified: F32.9

## 2014-08-07 LAB — COMPREHENSIVE METABOLIC PANEL
ALT: 15 U/L (ref 0–35)
ANION GAP: 15 (ref 5–15)
AST: 12 U/L (ref 0–37)
Albumin: 3.4 g/dL — ABNORMAL LOW (ref 3.5–5.2)
Alkaline Phosphatase: 85 U/L (ref 39–117)
BUN: 12 mg/dL (ref 6–23)
CALCIUM: 9.3 mg/dL (ref 8.4–10.5)
CO2: 24 meq/L (ref 19–32)
Chloride: 100 mEq/L (ref 96–112)
Creatinine, Ser: 0.82 mg/dL (ref 0.50–1.10)
GFR, EST NON AFRICAN AMERICAN: 81 mL/min — AB (ref >90)
GLUCOSE: 243 mg/dL — AB (ref 70–99)
Potassium: 3.6 mEq/L — ABNORMAL LOW (ref 3.7–5.3)
SODIUM: 139 meq/L (ref 137–147)
TOTAL PROTEIN: 7.3 g/dL (ref 6.0–8.3)
Total Bilirubin: 0.3 mg/dL (ref 0.3–1.2)

## 2014-08-07 LAB — CBC WITH DIFFERENTIAL/PLATELET
Basophils Absolute: 0 10*3/uL (ref 0.0–0.1)
Basophils Relative: 0 % (ref 0–1)
EOS PCT: 3 % (ref 0–5)
Eosinophils Absolute: 0.2 10*3/uL (ref 0.0–0.7)
HEMATOCRIT: 38.8 % (ref 36.0–46.0)
Hemoglobin: 12.4 g/dL (ref 12.0–15.0)
LYMPHS ABS: 2 10*3/uL (ref 0.7–4.0)
Lymphocytes Relative: 32 % (ref 12–46)
MCH: 29.5 pg (ref 26.0–34.0)
MCHC: 32 g/dL (ref 30.0–36.0)
MCV: 92.2 fL (ref 78.0–100.0)
Monocytes Absolute: 0.4 10*3/uL (ref 0.1–1.0)
Monocytes Relative: 6 % (ref 3–12)
Neutro Abs: 3.6 10*3/uL (ref 1.7–7.7)
Neutrophils Relative %: 59 % (ref 43–77)
PLATELETS: 306 10*3/uL (ref 150–400)
RBC: 4.21 MIL/uL (ref 3.87–5.11)
RDW: 13.8 % (ref 11.5–15.5)
WBC: 6.2 10*3/uL (ref 4.0–10.5)

## 2014-08-07 LAB — SURGICAL PCR SCREEN
MRSA, PCR: NEGATIVE
Staphylococcus aureus: NEGATIVE

## 2014-08-07 LAB — URINE MICROSCOPIC-ADD ON

## 2014-08-07 LAB — URINALYSIS, ROUTINE W REFLEX MICROSCOPIC
GLUCOSE, UA: NEGATIVE mg/dL
HGB URINE DIPSTICK: NEGATIVE
KETONES UR: 15 mg/dL — AB
Nitrite: NEGATIVE
PH: 5.5 (ref 5.0–8.0)
PROTEIN: NEGATIVE mg/dL
Specific Gravity, Urine: 1.022 (ref 1.005–1.030)
Urobilinogen, UA: 1 mg/dL (ref 0.0–1.0)

## 2014-08-07 LAB — PROTIME-INR
INR: 1.06 (ref 0.00–1.49)
Prothrombin Time: 13.8 seconds (ref 11.6–15.2)

## 2014-08-07 LAB — ABO/RH: ABO/RH(D): A POS

## 2014-08-07 LAB — TYPE AND SCREEN
ABO/RH(D): A POS
Antibody Screen: NEGATIVE

## 2014-08-07 LAB — APTT: aPTT: 32 seconds (ref 24–37)

## 2014-08-07 NOTE — Pre-Procedure Instructions (Signed)
Morgan [place]  08/07/2014   Your procedure is scheduled on:  Friday August 15, 2014 at 10:15 AM.  Report to [facility] Admitting at 8:15 AM.  Call this number if you have problems the morning of surgery: [phone]   Remember:   Do not eat food or drink liquids after midnight.   Take these medicines the morning of surgery with A SIP OF WATER: Amlodipine (Norvasc), Fluoxetine (Prozac), Hydrocodone if needed for pain, Levothyroxine (Synthroid), Metoprolol (Toprol XL), and Omeprazole (Prilosec)   Do NOT take any diabetic medications the morning of your surgery   Do not wear jewelry, make-up or nail polish.  Do not wear lotions, powders, or perfumes.   Do not shave 48 hours prior to surgery.   Do not bring valuables to the hospital.  [facility] is not responsible for any belongings or valuables.               Contacts, dentures or bridgework may not be worn into surgery.  Leave suitcase in the car. After surgery it may be brought to your room.  For patients admitted to the hospital, discharge time is determined by your treatment team.               Patients discharged the day of surgery will not be allowed to drive home.  Name and phone number of your driver: family/friend  Special Instructions: Shower using CHG soap the night before and the morning of your surgery   Please read over the following fact sheets that you were given: Pain Booklet, Coughing and Deep Breathing, Blood Transfusion Information, Total Joint Packet, MRSA Information and Surgical Site Infection Prevention

## 2014-08-07 NOTE — Progress Notes (Signed)
PCP is Dr. Jimmye Norman in [place] (patient unsure of physicians first name). Patient denied having any acute cardiac or pulmonary issues. Patient informed Nurse that she had a sleep study in 2011 in [place], but denied having sleep apnea.

## 2014-08-08 LAB — URINE CULTURE
Colony Count: NO GROWTH
Culture: NO GROWTH

## 2014-08-11 NOTE — Progress Notes (Signed)
Anesthesia Chart Review: Patient is a 53 year old female scheduled for right TKA on 08/15/14 by Dr. Lorenz Coaster.  History includes non-smoker, DM2, HTN, hypothyroidism, anxiety, depression, GERD, stage I invasive ductal carcinoma of the left breast s/p left lumpectomy '09 and radiation and Arimidex, HLD, appendectomy. BMI is consistent with obesity. She reported her son has difficulty waking up after anesthesia, but denied any personal anesthesia complications. PCP is listed as Dr. Bufford Buttner in [place] with surgical clearance note signed by Princella Ion on 07/22/14.     EKG on 08/07/14 showed: NSR, T wave abnormality, consider inferolateral ischemia.  Her EKG was not felt significantly changed when compared to her previous tracing on 01/12/07.  Inferior T wave abnormality has been present on prior EKGs dating back to 12/2003 (see Muse).  She had an echo in 12/2003 for post-partum pulmonary edema that showed low normal LV systolic function, EF 30-94%, LV wall thickness at the upper limits of normal, mild MR.  CXR on 08/07/14 showed: No active cardiopulmonary disease.  Preoperative labs noted.  Non-fasting glucose 243 at PAT. She will get a fasting glucose preoperatively. Urine culture showed no growth.  Her EKG has shown an inferolateral T wave abnormality since at least 2008.  She was cleared by her PCP for this procedure.  No CV symptoms documented at her PAT visit. If no acute changes on new CV symptoms then I would anticipate that she could proceed as planned.  Anesthesiologist Dr. Linna Caprice agrees with this plan.  [facility] Short Stay Center/Anesthesiology Phone [phone] 08/11/2014 3:56 PM

## 2014-08-14 MED ORDER — CEFAZOLIN SODIUM-DEXTROSE 2-3 GM-% IV SOLR
2.0000 g | INTRAVENOUS | Status: AC
  Administered 2014-08-15: 2 g via INTRAVENOUS
  Filled 2014-08-14: qty 50

## 2014-08-14 MED ORDER — TRANEXAMIC ACID 100 MG/ML IV SOLN
1000.0000 mg | INTRAVENOUS | Status: AC
  Administered 2014-08-15: 1000 mg via INTRAVENOUS
  Filled 2014-08-14: qty 10

## 2014-08-14 NOTE — Progress Notes (Signed)
Patient instructed to arrive at short stay at 0730 instead of 0815. Patient verbalized understanding.

## 2014-08-15 ENCOUNTER — Encounter (HOSPITAL_COMMUNITY): Payer: Medicare Other | Admitting: Vascular Surgery

## 2014-08-15 ENCOUNTER — Encounter (HOSPITAL_COMMUNITY): Payer: Self-pay | Admitting: *Deleted

## 2014-08-15 ENCOUNTER — Inpatient Hospital Stay (HOSPITAL_COMMUNITY)
Admission: RE | Admit: 2014-08-15 | Discharge: 2014-08-18 | DRG: 470 | Disposition: A | Discharge status: Home Health | Payer: Medicare Other | Source: Ambulatory Visit | Attending: Orthopedic Surgery | Admitting: Orthopedic Surgery

## 2014-08-15 ENCOUNTER — Ambulatory Visit (HOSPITAL_COMMUNITY): Payer: Medicare Other | Admitting: Anesthesiology

## 2014-08-15 ENCOUNTER — Encounter (HOSPITAL_COMMUNITY)
Admission: RE | Disposition: A | Discharge status: Home Health | Payer: Self-pay | Source: Ambulatory Visit | Attending: Orthopedic Surgery

## 2014-08-15 DIAGNOSIS — E119 Type 2 diabetes mellitus without complications: Secondary | ICD-10-CM | POA: Diagnosis present

## 2014-08-15 DIAGNOSIS — D62 Acute posthemorrhagic anemia: Secondary | ICD-10-CM | POA: Diagnosis not present

## 2014-08-15 DIAGNOSIS — I10 Essential (primary) hypertension: Secondary | ICD-10-CM | POA: Diagnosis present

## 2014-08-15 DIAGNOSIS — M1710 Unilateral primary osteoarthritis, unspecified knee: Secondary | ICD-10-CM | POA: Diagnosis present

## 2014-08-15 DIAGNOSIS — M25569 Pain in unspecified knee: Secondary | ICD-10-CM | POA: Diagnosis present

## 2014-08-15 DIAGNOSIS — Z853 Personal history of malignant neoplasm of breast: Secondary | ICD-10-CM

## 2014-08-15 DIAGNOSIS — M1711 Unilateral primary osteoarthritis, right knee: Secondary | ICD-10-CM | POA: Diagnosis present

## 2014-08-15 HISTORY — PX: TOTAL KNEE ARTHROPLASTY: SHX125

## 2014-08-15 LAB — GLUCOSE, CAPILLARY
GLUCOSE-CAPILLARY: 168 mg/dL — AB (ref 70–99)
GLUCOSE-CAPILLARY: 169 mg/dL — AB (ref 70–99)
Glucose-Capillary: 197 mg/dL — ABNORMAL HIGH (ref 70–99)
Glucose-Capillary: 157 mg/dL — ABNORMAL HIGH (ref 70–99)

## 2014-08-15 SURGERY — ARTHROPLASTY, KNEE, TOTAL
Anesthesia: General | Laterality: Right

## 2014-08-15 MED ORDER — SENNOSIDES-DOCUSATE SODIUM 8.6-50 MG PO TABS
1.0000 | ORAL_TABLET | Freq: Every evening | ORAL | Status: DC | PRN
Start: 2014-08-15 — End: 2014-08-18

## 2014-08-15 MED ORDER — ACETAMINOPHEN 650 MG RE SUPP: 650.0000 mg | Freq: Four times a day (QID) | RECTAL | Status: DC | PRN

## 2014-08-15 MED ORDER — PROPOFOL 10 MG/ML IV BOLUS
INTRAVENOUS | Status: DC | PRN
  Administered 2014-08-15: 40 mg via INTRAVENOUS
  Administered 2014-08-15: 160 mg via INTRAVENOUS

## 2014-08-15 MED ORDER — FENTANYL CITRATE 0.05 MG/ML IJ SOLN
INTRAMUSCULAR | Status: DC | PRN
  Administered 2014-08-15 (×4): 50 ug via INTRAVENOUS
  Administered 2014-08-15: 100 ug via INTRAVENOUS
  Administered 2014-08-15 (×2): 50 ug via INTRAVENOUS
  Administered 2014-08-15: 100 ug via INTRAVENOUS

## 2014-08-15 MED ORDER — LACTATED RINGERS IV SOLN
Freq: Once | INTRAVENOUS | Status: AC
  Administered 2014-08-15: 10:00:00 via INTRAVENOUS

## 2014-08-15 MED ORDER — MORPHINE SULFATE 2 MG/ML IJ SOLN
INTRAMUSCULAR | Status: AC
  Administered 2014-08-15: 2 mg via INTRAVENOUS
  Filled 2014-08-15: qty 1

## 2014-08-15 MED ORDER — PROMETHAZINE HCL 25 MG/ML IJ SOLN: 6.2500 mg | INTRAMUSCULAR | Status: DC | PRN

## 2014-08-15 MED ORDER — LACTATED RINGERS IV SOLN
INTRAVENOUS | Status: DC | PRN
  Administered 2014-08-15 (×2): via INTRAVENOUS

## 2014-08-15 MED ORDER — ATORVASTATIN CALCIUM 20 MG PO TABS
20.0000 mg | ORAL_TABLET | Freq: Every morning | ORAL | Status: DC
  Administered 2014-08-16 – 2014-08-18 (×3): 20 mg via ORAL
  Filled 2014-08-15 (×3): qty 1

## 2014-08-15 MED ORDER — LOSARTAN POTASSIUM 50 MG PO TABS
100.0000 mg | ORAL_TABLET | Freq: Every morning | ORAL | Status: DC
  Administered 2014-08-16 – 2014-08-18 (×3): 100 mg via ORAL
  Filled 2014-08-15 (×3): qty 2

## 2014-08-15 MED ORDER — MORPHINE SULFATE 4 MG/ML IJ SOLN
INTRAMUSCULAR | Status: AC
  Filled 2014-08-15: qty 1

## 2014-08-15 MED ORDER — FENTANYL CITRATE 0.05 MG/ML IJ SOLN
INTRAMUSCULAR | Status: AC
  Filled 2014-08-15: qty 5

## 2014-08-15 MED ORDER — MORPHINE SULFATE 2 MG/ML IJ SOLN
1.0000 mg | INTRAMUSCULAR | Status: DC | PRN
  Administered 2014-08-15: 4 mg via INTRAVENOUS
  Administered 2014-08-15 (×2): 2 mg via INTRAVENOUS

## 2014-08-15 MED ORDER — GLYCOPYRROLATE 0.2 MG/ML IJ SOLN
INTRAMUSCULAR | Status: DC | PRN
  Administered 2014-08-15: 0.4 mg via INTRAVENOUS
  Administered 2014-08-15: 0.2 mg via INTRAVENOUS

## 2014-08-15 MED ORDER — ONDANSETRON HCL 4 MG/2ML IJ SOLN
4.0000 mg | Freq: Four times a day (QID) | INTRAMUSCULAR | Status: DC | PRN
  Administered 2014-08-15 – 2014-08-18 (×2): 4 mg via INTRAVENOUS
  Filled 2014-08-15 (×2): qty 2

## 2014-08-15 MED ORDER — PHENYLEPHRINE HCL 10 MG/ML IJ SOLN
INTRAMUSCULAR | Status: DC | PRN
  Administered 2014-08-15 (×2): 80 ug via INTRAVENOUS

## 2014-08-15 MED ORDER — FENTANYL CITRATE 0.05 MG/ML IJ SOLN
INTRAMUSCULAR | Status: AC
  Administered 2014-08-15: 100 ug via INTRAVENOUS
  Filled 2014-08-15: qty 2

## 2014-08-15 MED ORDER — OXYCODONE HCL 5 MG PO TABS
10.0000 mg | ORAL_TABLET | ORAL | Status: DC | PRN
  Administered 2014-08-15: 10 mg via ORAL

## 2014-08-15 MED ORDER — ANASTROZOLE 1 MG PO TABS
1.0000 mg | ORAL_TABLET | Freq: Every day | ORAL | Status: DC
  Administered 2014-08-15 – 2014-08-18 (×4): 1 mg via ORAL
  Filled 2014-08-15 (×4): qty 1

## 2014-08-15 MED ORDER — RIVAROXABAN 10 MG PO TABS
10.0000 mg | ORAL_TABLET | Freq: Every day | ORAL | Status: DC
  Administered 2014-08-16 – 2014-08-18 (×3): 10 mg via ORAL
  Filled 2014-08-15 (×4): qty 1

## 2014-08-15 MED ORDER — CLONAZEPAM 1 MG PO TABS
1.0000 mg | ORAL_TABLET | Freq: Every day | ORAL | Status: DC
  Administered 2014-08-15 – 2014-08-17 (×3): 1 mg via ORAL
  Filled 2014-08-15 (×3): qty 1

## 2014-08-15 MED ORDER — OXYCODONE-ACETAMINOPHEN 5-325 MG PO TABS
1.0000 | ORAL_TABLET | Freq: Four times a day (QID) | ORAL | Status: DC | PRN
  Administered 2014-08-15 – 2014-08-18 (×8): 2 via ORAL
  Filled 2014-08-15 (×9): qty 2

## 2014-08-15 MED ORDER — SODIUM CHLORIDE 0.9 % IV SOLN: INTRAVENOUS | Status: DC

## 2014-08-15 MED ORDER — SODIUM CHLORIDE 0.9 % IV SOLN
INTRAVENOUS | Status: DC
  Administered 2014-08-15 – 2014-08-16 (×2): via INTRAVENOUS

## 2014-08-15 MED ORDER — OXYCODONE HCL 5 MG PO TABS: 5.0000 mg | ORAL_TABLET | Freq: Once | ORAL | Status: DC | PRN

## 2014-08-15 MED ORDER — HYDROCODONE-ACETAMINOPHEN 10-325 MG PO TABS: 1.0000 | ORAL_TABLET | ORAL | Status: DC | PRN

## 2014-08-15 MED ORDER — AMLODIPINE BESYLATE 10 MG PO TABS
10.0000 mg | ORAL_TABLET | Freq: Every morning | ORAL | Status: DC
  Administered 2014-08-16 – 2014-08-18 (×3): 10 mg via ORAL
  Filled 2014-08-15 (×3): qty 1

## 2014-08-15 MED ORDER — MIDAZOLAM HCL 2 MG/2ML IJ SOLN
INTRAMUSCULAR | Status: AC
  Administered 2014-08-15: 1 mg
  Filled 2014-08-15: qty 2

## 2014-08-15 MED ORDER — HYDROMORPHONE HCL PF 1 MG/ML IJ SOLN
1.0000 mg | INTRAMUSCULAR | Status: DC | PRN
  Administered 2014-08-15 – 2014-08-18 (×18): 1 mg via INTRAVENOUS
  Filled 2014-08-15 (×18): qty 1

## 2014-08-15 MED ORDER — CHLORHEXIDINE GLUCONATE 4 % EX LIQD: 60.0000 mL | Freq: Once | CUTANEOUS | Status: DC

## 2014-08-15 MED ORDER — ONDANSETRON HCL 4 MG/2ML IJ SOLN
INTRAMUSCULAR | Status: DC | PRN
  Administered 2014-08-15: 4 mg via INTRAVENOUS

## 2014-08-15 MED ORDER — BUPIVACAINE-EPINEPHRINE (PF) 0.5% -1:200000 IJ SOLN
INTRAMUSCULAR | Status: DC | PRN
  Administered 2014-08-15: 25 mL via PERINEURAL

## 2014-08-15 MED ORDER — DIPHENHYDRAMINE HCL 12.5 MG/5ML PO ELIX
12.5000 mg | ORAL_SOLUTION | ORAL | Status: DC | PRN
  Administered 2014-08-15 – 2014-08-18 (×9): 25 mg via ORAL
  Filled 2014-08-15 (×9): qty 10

## 2014-08-15 MED ORDER — HYDROMORPHONE HCL PF 1 MG/ML IJ SOLN
INTRAMUSCULAR | Status: AC
  Administered 2014-08-15: 0.5 mg via INTRAVENOUS
  Filled 2014-08-15: qty 1

## 2014-08-15 MED ORDER — PANTOPRAZOLE SODIUM 40 MG PO TBEC
40.0000 mg | DELAYED_RELEASE_TABLET | Freq: Every day | ORAL | Status: DC
  Administered 2014-08-15 – 2014-08-18 (×4): 40 mg via ORAL
  Filled 2014-08-15 (×4): qty 1

## 2014-08-15 MED ORDER — MIDAZOLAM HCL 5 MG/ML IJ SOLN: 1.0000 mg | Freq: Once | INTRAMUSCULAR | Status: AC

## 2014-08-15 MED ORDER — INSULIN ASPART 100 UNIT/ML ~~LOC~~ SOLN
0.0000 [IU] | Freq: Every day | SUBCUTANEOUS | Status: DC
  Administered 2014-08-16: 2 [IU] via SUBCUTANEOUS

## 2014-08-15 MED ORDER — HYDROMORPHONE HCL PF 1 MG/ML IJ SOLN
0.2500 mg | INTRAMUSCULAR | Status: DC | PRN
  Administered 2014-08-15 (×4): 0.5 mg via INTRAVENOUS

## 2014-08-15 MED ORDER — FLEET ENEMA 7-19 GM/118ML RE ENEM: 1.0000 | ENEMA | Freq: Once | RECTAL | Status: AC | PRN

## 2014-08-15 MED ORDER — 0.9 % SODIUM CHLORIDE (POUR BTL) OPTIME
TOPICAL | Status: DC | PRN
  Administered 2014-08-15: 1000 mL

## 2014-08-15 MED ORDER — LIDOCAINE HCL (CARDIAC) 20 MG/ML IV SOLN
INTRAVENOUS | Status: DC | PRN
  Administered 2014-08-15: 80 mg via INTRAVENOUS

## 2014-08-15 MED ORDER — ONDANSETRON HCL 4 MG/2ML IJ SOLN
INTRAMUSCULAR | Status: AC
  Filled 2014-08-15: qty 2

## 2014-08-15 MED ORDER — CEFAZOLIN SODIUM 1-5 GM-% IV SOLN
1.0000 g | Freq: Four times a day (QID) | INTRAVENOUS | Status: AC
  Administered 2014-08-15 – 2014-08-16 (×2): 1 g via INTRAVENOUS
  Filled 2014-08-15 (×2): qty 50

## 2014-08-15 MED ORDER — MIDAZOLAM HCL 5 MG/5ML IJ SOLN
INTRAMUSCULAR | Status: DC | PRN
  Administered 2014-08-15: 2 mg via INTRAVENOUS

## 2014-08-15 MED ORDER — ROCURONIUM BROMIDE 100 MG/10ML IV SOLN
INTRAVENOUS | Status: DC | PRN
  Administered 2014-08-15: 40 mg via INTRAVENOUS

## 2014-08-15 MED ORDER — DEXTROSE 5 % IV SOLN
500.0000 mg | Freq: Four times a day (QID) | INTRAVENOUS | Status: DC | PRN
  Administered 2014-08-15: 500 mg via INTRAVENOUS
  Filled 2014-08-15 (×2): qty 5

## 2014-08-15 MED ORDER — METHOCARBAMOL 500 MG PO TABS
500.0000 mg | ORAL_TABLET | Freq: Four times a day (QID) | ORAL | Status: DC | PRN
Start: 2014-08-15 — End: 2014-08-15

## 2014-08-15 MED ORDER — GLYCOPYRROLATE 0.2 MG/ML IJ SOLN
INTRAMUSCULAR | Status: AC
  Filled 2014-08-15: qty 2

## 2014-08-15 MED ORDER — INSULIN ASPART 100 UNIT/ML ~~LOC~~ SOLN
4.0000 [IU] | Freq: Three times a day (TID) | SUBCUTANEOUS | Status: DC
  Administered 2014-08-16 – 2014-08-18 (×5): 4 [IU] via SUBCUTANEOUS

## 2014-08-15 MED ORDER — NEOSTIGMINE METHYLSULFATE 10 MG/10ML IV SOLN
INTRAVENOUS | Status: AC
  Filled 2014-08-15: qty 1

## 2014-08-15 MED ORDER — ACETAMINOPHEN 325 MG PO TABS
650.0000 mg | ORAL_TABLET | Freq: Four times a day (QID) | ORAL | Status: DC | PRN
  Administered 2014-08-18: 650 mg via ORAL
  Filled 2014-08-15: qty 2

## 2014-08-15 MED ORDER — ONDANSETRON HCL 4 MG PO TABS: 4.0000 mg | ORAL_TABLET | Freq: Four times a day (QID) | ORAL | Status: DC | PRN

## 2014-08-15 MED ORDER — MENTHOL 3 MG MT LOZG: 1.0000 | LOZENGE | OROMUCOSAL | Status: DC | PRN

## 2014-08-15 MED ORDER — OXYCODONE HCL 5 MG PO TABS
ORAL_TABLET | ORAL | Status: AC
  Administered 2014-08-15: 10 mg via ORAL
  Filled 2014-08-15: qty 2

## 2014-08-15 MED ORDER — ROCURONIUM BROMIDE 50 MG/5ML IV SOLN
INTRAVENOUS | Status: AC
  Filled 2014-08-15: qty 1

## 2014-08-15 MED ORDER — SODIUM CHLORIDE 0.9 % IR SOLN
Status: DC | PRN
  Administered 2014-08-15: 1000 mL

## 2014-08-15 MED ORDER — INSULIN ASPART 100 UNIT/ML ~~LOC~~ SOLN
0.0000 [IU] | Freq: Three times a day (TID) | SUBCUTANEOUS | Status: DC
  Administered 2014-08-15 – 2014-08-16 (×2): 3 [IU] via SUBCUTANEOUS
  Administered 2014-08-16: 2 [IU] via SUBCUTANEOUS
  Administered 2014-08-16: 3 [IU] via SUBCUTANEOUS
  Administered 2014-08-17: 5 [IU] via SUBCUTANEOUS
  Administered 2014-08-17 – 2014-08-18 (×2): 3 [IU] via SUBCUTANEOUS
  Administered 2014-08-18: 2 [IU] via SUBCUTANEOUS

## 2014-08-15 MED ORDER — PHENOL 1.4 % MT LIQD: 1.0000 | OROMUCOSAL | Status: DC | PRN

## 2014-08-15 MED ORDER — NEOSTIGMINE METHYLSULFATE 10 MG/10ML IV SOLN
INTRAVENOUS | Status: DC | PRN
  Administered 2014-08-15: 3 mg via INTRAVENOUS

## 2014-08-15 MED ORDER — MIDAZOLAM HCL 2 MG/2ML IJ SOLN
INTRAMUSCULAR | Status: AC
  Filled 2014-08-15: qty 2

## 2014-08-15 MED ORDER — FLUOXETINE HCL 60 MG PO TABS: 60.0000 mg | ORAL_TABLET | Freq: Every day | ORAL | Status: DC

## 2014-08-15 MED ORDER — OXYCODONE HCL 5 MG/5ML PO SOLN
5.0000 mg | Freq: Once | ORAL | Status: DC | PRN
Start: 2014-08-15 — End: 2014-08-15

## 2014-08-15 MED ORDER — METOCLOPRAMIDE HCL 5 MG/ML IJ SOLN
5.0000 mg | Freq: Three times a day (TID) | INTRAMUSCULAR | Status: DC | PRN
  Administered 2014-08-15: 10 mg via INTRAVENOUS
  Filled 2014-08-15: qty 2

## 2014-08-15 MED ORDER — METOCLOPRAMIDE HCL 5 MG PO TABS
5.0000 mg | ORAL_TABLET | Freq: Three times a day (TID) | ORAL | Status: DC | PRN
  Filled 2014-08-15: qty 2

## 2014-08-15 MED ORDER — BUPIVACAINE-EPINEPHRINE (PF) 0.5% -1:200000 IJ SOLN
INTRAMUSCULAR | Status: AC
  Filled 2014-08-15: qty 30

## 2014-08-15 MED ORDER — BISACODYL 10 MG RE SUPP
10.0000 mg | Freq: Every day | RECTAL | Status: DC | PRN
Start: 2014-08-15 — End: 2014-08-18
  Administered 2014-08-17: 10 mg via RECTAL
  Filled 2014-08-15: qty 1

## 2014-08-15 MED ORDER — METOPROLOL SUCCINATE ER 50 MG PO TB24
50.0000 mg | ORAL_TABLET | Freq: Once | ORAL | Status: DC
  Filled 2014-08-15 (×2): qty 1

## 2014-08-15 MED ORDER — LEVOTHYROXINE SODIUM 75 MCG PO TABS
75.0000 ug | ORAL_TABLET | Freq: Every day | ORAL | Status: DC
  Administered 2014-08-16 – 2014-08-18 (×3): 75 ug via ORAL
  Filled 2014-08-15 (×4): qty 1

## 2014-08-15 MED ORDER — FENTANYL CITRATE 0.05 MG/ML IJ SOLN
100.0000 ug | Freq: Once | INTRAMUSCULAR | Status: AC
  Administered 2014-08-15: 100 ug via INTRAVENOUS

## 2014-08-15 MED ORDER — PROPOFOL 10 MG/ML IV BOLUS
INTRAVENOUS | Status: AC
  Filled 2014-08-15: qty 20

## 2014-08-15 MED ORDER — LIDOCAINE HCL (CARDIAC) 20 MG/ML IV SOLN
INTRAVENOUS | Status: AC
  Filled 2014-08-15: qty 5

## 2014-08-15 MED ORDER — DOCUSATE SODIUM 100 MG PO CAPS
100.0000 mg | ORAL_CAPSULE | Freq: Two times a day (BID) | ORAL | Status: DC
  Administered 2014-08-15 – 2014-08-18 (×6): 100 mg via ORAL
  Filled 2014-08-15 (×8): qty 1

## 2014-08-15 MED ORDER — RIVAROXABAN 10 MG PO TABS: 10.0000 mg | ORAL_TABLET | Freq: Every day | ORAL | Status: DC

## 2014-08-15 MED ORDER — OXYCODONE HCL 5 MG PO TABS: ORAL_TABLET | ORAL | Status: DC

## 2014-08-15 MED ORDER — TOPIRAMATE 100 MG PO TABS
100.0000 mg | ORAL_TABLET | Freq: Every day | ORAL | Status: DC
  Administered 2014-08-16 – 2014-08-17 (×2): 100 mg via ORAL
  Filled 2014-08-15 (×4): qty 1

## 2014-08-15 MED ORDER — METOPROLOL SUCCINATE ER 50 MG PO TB24
50.0000 mg | ORAL_TABLET | Freq: Every morning | ORAL | Status: DC
  Administered 2014-08-16 – 2014-08-18 (×3): 50 mg via ORAL
  Filled 2014-08-15 (×3): qty 1

## 2014-08-15 MED ORDER — FLUOXETINE HCL 20 MG PO CAPS
60.0000 mg | ORAL_CAPSULE | Freq: Every day | ORAL | Status: DC
  Administered 2014-08-16 – 2014-08-18 (×3): 60 mg via ORAL
  Filled 2014-08-15 (×3): qty 3

## 2014-08-15 SURGICAL SUPPLY — 70 items
BANDAGE ELASTIC 4 VELCRO ST LF (GAUZE/BANDAGES/DRESSINGS) ×3 IMPLANT
BANDAGE ELASTIC 6 VELCRO ST LF (GAUZE/BANDAGES/DRESSINGS) ×3 IMPLANT
BANDAGE ESMARK 6X9 LF (GAUZE/BANDAGES/DRESSINGS) ×1 IMPLANT
BLADE SAGITTAL 25.0X1.19X90 (BLADE) ×2 IMPLANT
BLADE SAGITTAL 25.0X1.19X90MM (BLADE) ×1
BLADE SAW SAG 90X13X1.27 (BLADE) ×3 IMPLANT
BNDG CMPR 9X6 STRL LF SNTH (GAUZE/BANDAGES/DRESSINGS) ×1
BNDG ESMARK 6X9 LF (GAUZE/BANDAGES/DRESSINGS) ×3
BONE CEMENT GENTAMICIN (Cement) ×6 IMPLANT
BOWL SMART MIX CTS (DISPOSABLE) ×3 IMPLANT
CAPT RP KNEE ×2 IMPLANT
CEMENT BONE GENTAMICIN 40 (Cement) IMPLANT
COVER SURGICAL LIGHT HANDLE (MISCELLANEOUS) ×3 IMPLANT
CUFF TOURNIQUET SINGLE 34IN LL (TOURNIQUET CUFF) ×3 IMPLANT
CUFF TOURNIQUET SINGLE 44IN (TOURNIQUET CUFF) IMPLANT
DRAPE INCISE IOBAN 66X45 STRL (DRAPES) IMPLANT
DRAPE ORTHO SPLIT 77X108 STRL (DRAPES) ×6
DRAPE SURG ORHT 6 SPLT 77X108 (DRAPES) ×2 IMPLANT
DRAPE U-SHAPE 47X51 STRL (DRAPES) ×3 IMPLANT
DRSG ADAPTIC 3X8 NADH LF (GAUZE/BANDAGES/DRESSINGS) ×3 IMPLANT
DRSG PAD ABDOMINAL 8X10 ST (GAUZE/BANDAGES/DRESSINGS) ×4 IMPLANT
DURAPREP 26ML APPLICATOR (WOUND CARE) ×3 IMPLANT
ELECT REM PT RETURN 9FT ADLT (ELECTROSURGICAL) ×3
ELECTRODE REM PT RTRN 9FT ADLT (ELECTROSURGICAL) ×1 IMPLANT
EVACUATOR 1/8 PVC DRAIN (DRAIN) ×3 IMPLANT
FACESHIELD WRAPAROUND (MASK) ×3 IMPLANT
FACESHIELD WRAPAROUND OR TEAM (MASK) ×2 IMPLANT
FLOSEAL 10ML (HEMOSTASIS) IMPLANT
GAUZE SPONGE 4X4 12PLY STRL (GAUZE/BANDAGES/DRESSINGS) ×3 IMPLANT
GLOVE BIO SURGEON STRL SZ7 (GLOVE) ×2 IMPLANT
GLOVE BIOGEL PI IND STRL 7.0 (GLOVE) IMPLANT
GLOVE BIOGEL PI IND STRL 8 (GLOVE) ×4 IMPLANT
GLOVE BIOGEL PI INDICATOR 7.0 (GLOVE) ×4
GLOVE BIOGEL PI INDICATOR 8 (GLOVE) ×8
GLOVE ORTHO TXT STRL SZ7.5 (GLOVE) ×9 IMPLANT
GLOVE SURG ORTHO 8.0 STRL STRW (GLOVE) ×9 IMPLANT
GOWN STRL REUS W/ TWL LRG LVL3 (GOWN DISPOSABLE) ×2 IMPLANT
GOWN STRL REUS W/ TWL XL LVL3 (GOWN DISPOSABLE) ×1 IMPLANT
GOWN STRL REUS W/TWL 2XL LVL3 (GOWN DISPOSABLE) ×3 IMPLANT
GOWN STRL REUS W/TWL LRG LVL3 (GOWN DISPOSABLE) ×6
GOWN STRL REUS W/TWL XL LVL3 (GOWN DISPOSABLE) ×6
HANDPIECE INTERPULSE COAX TIP (DISPOSABLE) ×3
HOOD PEEL AWAY FACE SHEILD DIS (HOOD) ×3 IMPLANT
IMMOBILIZER KNEE 22 UNIV (SOFTGOODS) ×2 IMPLANT
KIT BASIN OR (CUSTOM PROCEDURE TRAY) ×3 IMPLANT
KIT ROOM TURNOVER OR (KITS) ×3 IMPLANT
MANIFOLD NEPTUNE II (INSTRUMENTS) ×3 IMPLANT
NEEDLE 22X1 1/2 (OR ONLY) (NEEDLE) IMPLANT
NS IRRIG 1000ML POUR BTL (IV SOLUTION) ×3 IMPLANT
PACK TOTAL JOINT (CUSTOM PROCEDURE TRAY) ×3 IMPLANT
PAD ARMBOARD 7.5X6 YLW CONV (MISCELLANEOUS) ×6 IMPLANT
PAD CAST 4YDX4 CTTN HI CHSV (CAST SUPPLIES) ×1 IMPLANT
PADDING CAST COTTON 4X4 STRL (CAST SUPPLIES) ×3
PADDING CAST COTTON 6X4 STRL (CAST SUPPLIES) ×3 IMPLANT
SET HNDPC FAN SPRY TIP SCT (DISPOSABLE) ×1 IMPLANT
SPONGE GAUZE 4X4 12PLY STER LF (GAUZE/BANDAGES/DRESSINGS) ×2 IMPLANT
STAPLER VISISTAT 35W (STAPLE) ×3 IMPLANT
SUCTION FRAZIER TIP 10 FR DISP (SUCTIONS) ×3 IMPLANT
SUT ETHIBOND NAB CT1 #1 30IN (SUTURE) ×9 IMPLANT
SUT VIC AB 0 CT1 27 (SUTURE) ×3
SUT VIC AB 0 CT1 27XBRD ANBCTR (SUTURE) ×1 IMPLANT
SUT VIC AB 1 CT1 27 (SUTURE) ×6
SUT VIC AB 1 CT1 27XBRD ANBCTR (SUTURE) ×2 IMPLANT
SUT VIC AB 2-0 CT1 27 (SUTURE) ×6
SUT VIC AB 2-0 CT1 TAPERPNT 27 (SUTURE) ×2 IMPLANT
SYR CONTROL 10ML LL (SYRINGE) IMPLANT
TOWEL OR 17X24 6PK STRL BLUE (TOWEL DISPOSABLE) ×3 IMPLANT
TOWEL OR 17X26 10 PK STRL BLUE (TOWEL DISPOSABLE) ×3 IMPLANT
TRAY FOLEY CATH 16FRSI W/METER (SET/KITS/TRAYS/PACK) IMPLANT
WATER STERILE IRR 1000ML POUR (IV SOLUTION) ×6 IMPLANT

## 2014-08-15 NOTE — Anesthesia Preprocedure Evaluation (Signed)
Anesthesia Evaluation  Patient identified by MRN, date of birth, ID band Patient awake    Reviewed: Allergy & Precautions, H&P , NPO status , Patient's Chart, lab work & pertinent test results  History of Anesthesia Complications (+) Family history of anesthesia reaction  Airway Mallampati: I      Dental   Pulmonary shortness of breath,  breath sounds clear to auscultation        Cardiovascular hypertension, Rhythm:Regular Rate:Normal     Neuro/Psych PSYCHIATRIC DISORDERS    GI/Hepatic GERD-  ,  Endo/Other  diabetesHypothyroidism Morbid obesity  Renal/GU      Musculoskeletal   Abdominal   Peds  Hematology   Anesthesia Other Findings   Reproductive/Obstetrics                           Anesthesia Physical Anesthesia Plan  ASA: III  Anesthesia Plan: General   Post-op Pain Management:    Induction: Intravenous  Airway Management Planned: Oral ETT  Additional Equipment:   Intra-op Plan:   Post-operative Plan: Extubation in OR  Informed Consent: I have reviewed the patients History and Physical, chart, labs and discussed the procedure including the risks, benefits and alternatives for the proposed anesthesia with the patient or authorized representative who has indicated his/her understanding and acceptance.   Dental advisory given and History available from chart only  Plan Discussed with: CRNA and Surgeon  Anesthesia Plan Comments:         Anesthesia Quick Evaluation

## 2014-08-15 NOTE — Interval H&P Note (Signed)
History and Physical Interval Note:  08/15/2014 9:36 AM  M[place]  has presented today for surgery, with the diagnosis of DJD right knee  The various methods of treatment have been discussed with the patient and family. After consideration of risks, benefits and other options for treatment, the patient has consented to  Procedure(s): RIGHT TOTAL KNEE ARTHROPLASTY (Right) as a surgical intervention .  The patient's history has been reviewed, patient examined, no change in status, stable for surgery.  I have reviewed the patient's chart and labs.  Questions were answered to the patient's satisfaction.     Josafat Enrico JR,W D

## 2014-08-15 NOTE — Anesthesia Procedure Notes (Signed)
Anesthesia Regional Block:  Femoral nerve block  Pre-Anesthetic Checklist: ,, timeout performed, Correct Patient, Correct Site, Correct Laterality, Correct Procedure, Correct Position, site marked, Risks and benefits discussed, at surgeon's request and post-op pain management  Laterality: Right and Upper  Prep: chloraprep       Needles:  Injection technique: Single-shot  Needle Type: Echogenic Stimulator Needle     Needle Length: 9cm 9 cm Needle Gauge: 21 and 21 G  Needle insertion depth: 6 cm   Additional Needles:  Procedures: ultrasound guided (picture in chart) and nerve stimulator Femoral nerve block  Nerve Stimulator or Paresthesia:  Response: Twitch elicited, 0.8 mA,   Additional Responses:   Narrative:  Start time: 08/15/2014 9:10 AM End time: 08/15/2014 9:25 AM Injection made incrementally with aspirations every 5 mL.  Performed by: Personally  Anesthesiologist: Bartolo Darter, MD  Additional Notes: BP cuff, EKG monitors applied. Sedation begun. Femoral artery palpated for location of nerve. After nerve location anesthetic injected incrementally, slowly , and after neg aspirations. Tolerated well.

## 2014-08-15 NOTE — Care Management Note (Signed)
CARE MANAGEMENT NOTE 08/15/2014  Patient:  [place], [place]   Account Number:  [id number]  Date Initiated:  08/15/2014  Documentation initiated by:  Ricki Miller  Subjective/Objective Assessment:   53 yr old female s/p right total knee replacement.     Action/Plan:   PT/OT eval.  Patient preoperatively setup with [facility].  Case manager will continue to monitor.   Anticipated DC Date:  08/17/2014   Anticipated DC Plan:  [place]  CM consult      [place]   Choice offered to / List presented to:          [facility] arranged  [place]   Status of service:  In process, will continue to follow Medicare Important Message given?   (If response is "NO", the following Medicare IM given date fields will be blank) Date Medicare IM given:   Medicare IM given by:   Date Additional Medicare IM given:   Additional Medicare IM given by:    Discharge Disposition:    Per UR Regulation:  Reviewed for med. necessity/level of care/duration of stay

## 2014-08-15 NOTE — Progress Notes (Signed)
Orthopedic Tech Progress Note Patient Details:  Morgan [place] [birth year] [id number] CPM applied to RLE set at 0-60 due to patient's trouble with pain management at this time. If patient's pain subsides CPM flexion may be set higher. OHF applied to bed.  CPM Right Knee CPM Right Knee: On Right Knee Flexion (Degrees): 60 Right Knee Extension (Degrees): 0 Additional Comments:  (trapeze bar, pt unable to tolerate 90 felx at this time)   [name] 08/15/2014, 1:32 PM

## 2014-08-15 NOTE — H&P (View-Only) (Signed)
TOTAL KNEE ADMISSION H&P  Patient is being admitted for right total knee arthroplasty.  Subjective:  Chief Complaint:bilaterally knee pain.  HPI: [place], 53 y.o. female, has a history of pain and functional disability in the right knee due to arthritis and has failed non-surgical conservative treatments for greater than 12 weeks to includeNSAID's and/or analgesics, corticosteriod injections, use of assistive devices, weight reduction as appropriate and activity modification.  Onset of symptoms was gradual, starting 6 years ago with gradually worsening course since that time. The patient noted no past surgery on the right knee(s).  Patient currently rates pain in the bilaterally knee(s) at 10 out of 10 with activity. Patient has night pain, worsening of pain with activity and weight bearing, pain that interferes with activities of daily living, pain with passive range of motion, crepitus and joint swelling.  Patient has evidence of periarticular osteophytes and joint space narrowing by imaging studies. There is no active infection.  Patient Active Problem List   Diagnosis Date Noted  . Cancer of lower-outer quadrant of female breast 01/23/2012  . SNORING 05/03/2007  . HYPERGLYCEMIA 05/03/2007  . PHARYNGITIS, ACUTE 02/22/2007  . HYPERPLASIA, ENDOMETRIAL NOS 02/22/2007  . DYSMENORRHEA 10/18/2006  . DEGENERATIVE JOINT DISEASE 10/18/2006  . INSOMNIA 10/18/2006  . CHEST PAIN 10/18/2006  . OBESITY 10/05/2006  . ANXIETY 10/05/2006  . HYPERTENSION 10/05/2006  . GASTROENTERITIS 10/05/2006  . VAGINAL BLEEDING 10/05/2006  . MENORRHAGIA, PERIMENOPAUSAL 10/05/2006  . 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 Diabetes mellitus   . Hypertension   . Arthritis     Past Surgical History  Procedure Laterality Date  . Appendectomy    . Breast lumpectomy    . Shoulder arthroscopy      cleaned out joint  . Cesarean section     . Dilation and curettage of uterus       (Not in a hospital admission) Allergies  Allergen Reactions  . Sulfonamide Derivatives Hives    History  Substance Use Topics  . Smoking status: Never Smoker   . Smokeless tobacco: Never Used  . Alcohol Use: No    Family History  Problem Relation Age of Onset  . Colon cancer Neg Hx   . Pancreatic cancer Neg Hx   . Rectal cancer Neg Hx   . Stomach cancer Neg Hx      Review of Systems  Constitutional: Positive for weight loss. Negative for fever, chills, malaise/fatigue and diaphoresis.  HENT: Negative.   Eyes: Negative.   Respiratory: Negative.   Cardiovascular: Negative.   Gastrointestinal: Negative.   Genitourinary: Negative.   Musculoskeletal: Positive for joint pain and myalgias. Negative for falls.  Skin: Negative.   Neurological: Negative.  Negative for weakness.  Endo/Heme/Allergies: Negative.   Psychiatric/Behavioral: Negative.     Objective:  Physical Exam  Constitutional: She is oriented to person, place, and time. She appears well-developed and well-nourished. No distress.  HENT:  Head: Normocephalic and atraumatic.  Nose: Nose normal.  Eyes: Conjunctivae and EOM are normal. Pupils are equal, round, and reactive to light.  Neck: Normal range of motion. Neck supple.  Cardiovascular: Normal rate, regular rhythm, normal heart sounds and intact distal pulses.   Respiratory: Effort normal and breath sounds normal. No respiratory distress. She has no wheezes.  GI: Soft. Bowel sounds are normal. She exhibits no distension. There is no tenderness.  Musculoskeletal:       Right knee:  She exhibits decreased range of motion, swelling, effusion and bony tenderness. She exhibits no LCL laxity and no MCL laxity. Tenderness found.  Lymphadenopathy:    She has no cervical adenopathy.  Neurological: She is alert and oriented to person, place, and time. No cranial nerve deficit.  Skin: Skin is warm and dry. No rash noted. No  erythema. No pallor.  Psychiatric: She has a normal mood and affect. Her behavior is normal.    Vital signs in last 24 hours: @VSRANGES @  Labs:   Estimated body mass index is 31.48 kg/(m^2) as calculated from the following:   Height as of 06/04/14: 5' 2.5" (1.588 m).   Weight as of 06/04/14: 79.379 kg (175 lb).   Imaging Review Plain radiographs demonstrate severe degenerative joint disease of the bilaterally knee(s). The overall alignment issignificant varus. The bone quality appears to be good for age and reported activity level.  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 The treatment options including medical management, injection therapy arthroscopy and arthroplasty were discussed at length. The risks and benefits of total knee arthroplasty were presented and reviewed. The risks due to aseptic loosening, infection, stiffness, patella tracking problems, thromboembolic complications and other imponderables were discussed. The patient acknowledged the explanation, agreed to proceed with the plan and consent was signed. Patient is being admitted for inpatient treatment for surgery, pain control, PT, OT, prophylactic antibiotics, VTE prophylaxis, progressive ambulation and ADL's and discharge planning. The patient is planning to be discharged home with home health services

## 2014-08-15 NOTE — Brief Op Note (Signed)
08/15/2014  12:18 PM  PATIENT:  M[place]  52 y.o. female  PRE-OPERATIVE DIAGNOSIS:  DJD right knee  POST-OPERATIVE DIAGNOSIS:  DJD right knee  PROCEDURE:  Procedure(s): RIGHT TOTAL KNEE ARTHROPLASTY (Right)  SURGEON:  Surgeon(s) and Role:    * W D Valeta Harms., MD - Primary  PHYSICIAN ASSISTANT: Chriss Czar, PA-C  ASSISTANTS:   ANESTHESIA:   regional and general  EBL:  Total I/O In: 1500 [I.V.:1500] Out: -   BLOOD ADMINISTERED:none  DRAINS: 1 hemovac drain lateral right knee self suction   LOCAL MEDICATIONS USED:  NONE  SPECIMEN:  No Specimen  DISPOSITION OF SPECIMEN:  N/A  COUNTS:  YES  TOURNIQUET:   Total Tourniquet Time Documented: Thigh (Right) - 70 minutes Total: Thigh (Right) - 70 minutes   DICTATION: .Other Dictation: Dictation Number unknown  PLAN OF CARE: Admit to inpatient   PATIENT DISPOSITION:  PACU - hemodynamically stable.   Delay start of Pharmacological VTE agent (>24hrs) due to surgical blood loss or risk of bleeding: yes

## 2014-08-15 NOTE — Progress Notes (Signed)
Pt has rec'd 2mg  IV Dilaudid, pt reports continued 10/10 pain, Dr.Massaggee made aware, new orders rec'd, will cont to assess/treat pain level

## 2014-08-15 NOTE — Interval H&P Note (Signed)
History and Physical Interval Note:  08/15/2014 9:33 AM  [place]  has presented today for surgery, with the diagnosis of DJD right knee  The various methods of treatment have been discussed with the patient and family. After consideration of risks, benefits and other options for treatment, the patient has consented to  Procedure(s): RIGHT TOTAL KNEE ARTHROPLASTY (Right) as a surgical intervention .  The patient's history has been reviewed, patient examined, no change in status, stable for surgery.  I have reviewed the patient's chart and labs.  Questions were answered to the patient's satisfaction.     Ladiamond Gallina JR,W D

## 2014-08-15 NOTE — Progress Notes (Signed)
Spoke with Dr. Orene Desanctis, he did not want pt to take beta blocker Metoprolol XL 50mg .  Pt did not take today's dose from home. Order d/c'd

## 2014-08-15 NOTE — Plan of Care (Signed)
Problem: Consults Goal: Diagnosis- Total Joint Replacement Primary Total Knee RIght

## 2014-08-15 NOTE — Transfer of Care (Signed)
Immediate Anesthesia Transfer of Care Note  Patient: [place]  Procedure(s) Performed: Procedure(s): RIGHT TOTAL KNEE ARTHROPLASTY (Right)  Patient Location: PACU  Anesthesia Type:General  Level of Consciousness: awake, alert  and oriented  Airway & Oxygen Therapy: Patient Spontanous Breathing and Patient connected to nasal cannula oxygen  Post-op Assessment: Report given to PACU RN and Post -op Vital signs reviewed and stable  Post vital signs: Reviewed and stable  Complications: No apparent anesthesia complications

## 2014-08-15 NOTE — Progress Notes (Signed)
Orthopedic Tech Progress Note Patient Details:  M[place] [birth year] [id number]  Patient ID: [name], female   DOB: 1961-12-05, 53 y.o.   MRN: [id number] Placed pt's rle in cpm @ 0-60 degrees @2000   Hildred Priest 08/15/2014, 7:59 PM

## 2014-08-15 NOTE — Progress Notes (Signed)
Report given to Johny RN  

## 2014-08-15 NOTE — Progress Notes (Signed)
Utilization review completed.  

## 2014-08-16 LAB — CBC
HCT: 34.3 % — ABNORMAL LOW (ref 36.0–46.0)
Hemoglobin: 10.6 g/dL — ABNORMAL LOW (ref 12.0–15.0)
MCH: 29.7 pg (ref 26.0–34.0)
MCHC: 30.9 g/dL (ref 30.0–36.0)
MCV: 96.1 fL (ref 78.0–100.0)
Platelets: 195 10*3/uL (ref 150–400)
RBC: 3.57 MIL/uL — ABNORMAL LOW (ref 3.87–5.11)
RDW: 13.9 % (ref 11.5–15.5)
WBC: 8.1 10*3/uL (ref 4.0–10.5)

## 2014-08-16 LAB — BASIC METABOLIC PANEL
ANION GAP: 13 (ref 5–15)
BUN: 11 mg/dL (ref 6–23)
CO2: 27 meq/L (ref 19–32)
CREATININE: 0.92 mg/dL (ref 0.50–1.10)
Calcium: 8.5 mg/dL (ref 8.4–10.5)
Chloride: 96 mEq/L (ref 96–112)
GFR calc Af Amer: 82 mL/min — ABNORMAL LOW (ref >90)
GFR calc non Af Amer: 71 mL/min — ABNORMAL LOW (ref >90)
Glucose, Bld: 183 mg/dL — ABNORMAL HIGH (ref 70–99)
Potassium: 3.8 mEq/L (ref 3.7–5.3)
SODIUM: 136 meq/L — AB (ref 137–147)

## 2014-08-16 LAB — GLUCOSE, CAPILLARY
GLUCOSE-CAPILLARY: 187 mg/dL — AB (ref 70–99)
Glucose-Capillary: 175 mg/dL — ABNORMAL HIGH (ref 70–99)
Glucose-Capillary: 143 mg/dL — ABNORMAL HIGH (ref 70–99)
Glucose-Capillary: 216 mg/dL — ABNORMAL HIGH (ref 70–99)

## 2014-08-16 MED ORDER — PNEUMOCOCCAL VAC POLYVALENT 25 MCG/0.5ML IJ INJ
0.5000 mL | INJECTION | INTRAMUSCULAR | Status: AC
  Administered 2014-08-17: 0.5 mL via INTRAMUSCULAR
  Filled 2014-08-16 (×2): qty 0.5

## 2014-08-16 MED ORDER — INSULIN GLARGINE 100 UNIT/ML ~~LOC~~ SOLN
24.0000 [IU] | Freq: Every day | SUBCUTANEOUS | Status: DC
  Administered 2014-08-16: 24 [IU] via SUBCUTANEOUS
  Filled 2014-08-16 (×3): qty 0.24

## 2014-08-16 MED ORDER — OXYCODONE HCL 5 MG PO TABS
5.0000 mg | ORAL_TABLET | ORAL | Status: DC | PRN
  Administered 2014-08-16 – 2014-08-18 (×8): 10 mg via ORAL
  Filled 2014-08-16 (×8): qty 2

## 2014-08-16 MED ORDER — LIRAGLUTIDE 18 MG/3ML ~~LOC~~ SOPN
1.8000 mg | PEN_INJECTOR | Freq: Every day | SUBCUTANEOUS | Status: DC
  Administered 2014-08-17 – 2014-08-18 (×2): 1.8 mg via SUBCUTANEOUS

## 2014-08-16 NOTE — Op Note (Signed)
[name], Morgan Taylor NO.:  [id number]  MEDICAL RECORD NO.:  [id number]  LOCATION:  [id number]                        FACILITY:  [facility]  PHYSICIAN:  Lockie Pares, M.D.    DATE OF BIRTH:  01/25/1961  DATE OF PROCEDURE:  08/15/2014 DATE OF DISCHARGE:                              OPERATIVE REPORT   INDICATIONS:  This is a 53 year old, end-stage severe varus deformity of knee with flexion contracture thought to be amenable to surgery.  PREOPERATIVE DIAGNOSIS:  Severe osteoarthritis, right knee with varus deformity.  POSTOPERATIVE DIAGNOSIS:  Severe osteoarthritis, right knee with varus deformity.  OPERATION:  Right total knee replacement (Sigma size 3 femur, three 10 mm bearing with a 2.5 mm tibia and 35 mm all poly patella).  SURGEON:  Lockie Pares, MD  ASSISTANT:  Chriss Czar, PA-C  TOURNIQUET TIME:  One hour 5 minutes.  DESCRIPTION OF PROCEDURE:  Supine positioning, exsanguination of the leg, inflation to 350.  Straight skin incision was made with a medial parapatellar approach.  We stripped extensively medially due to the severe varus deformity removing the osteophytes, stripping of the MCL as well by externally rotating the foot to gain access to the posterior aspect of the knee.  A 5-degree valgus cut on the femur resecting 11 mm due to flexion contracture.  This was followed by resecting about 4 mm below the most diseased medial compartment with approximately a -2 degree posterior slope.  Extension gap was measured at 10 mm.  Size of the femur was sized to be a size 3.  We placed the cutting jig at the appropriate deep external rotation and then cut the anterior-posterior and chamfer cuts.  Posterior osteophytes were stripped from the knee particularly medially.  PCL was released, flexion gap equaled the extension gap at 10 mm.  Keel hole was cut for the tibia followed by a box cut on the femur. Trials were placed.  The patella was cut leaving  about 14 mm of native patella for an all poly patella.  Trials were again placed.  The resolution of the varus deformity was noted with full extension.  No tendency for bearing spin out and excellent balance medial and laterally.  Final components were inserted, tibia followed by femur patella.  We did elect to use a trial bearing when the final components were inserted. This was allowed to harden.  The trial bearing was removed.  There was small amounts of cement removed from the posterior aspect of the knee. Tourniquet was then released.  No excessive bleeding was noted from the posterior aspect of the knee.  Small bleeders were coagulated.  Final bearing was inserted with a drain exiting superolaterally. Closure was affected with #1 Ethibond, 2-0 Vicryl and skin clips. Lightly compressive sterile dressing, knee immobilizer applied, taken to recovery room in stable condition.     Lockie Pares, M.D.     WDC/MEDQ  D:  08/15/2014  T:  08/15/2014  Job:  [id number]

## 2014-08-16 NOTE — Evaluation (Signed)
Occupational Therapy Evaluation Patient Details Name: Morgan Taylor MRN: [id number] DOB: February 13, 1961 Today's Date: 08/16/2014    History of Present Illness 53 y.o. s/p right TKA.   Clinical Impression   Pt s/p above. Pt independent with ADLs, PTA. Feel pt will benefit from acute OT to increase independence prior to d/c.     Follow Up Recommendations  No OT follow up;Supervision - Intermittent    Equipment Recommendations  None recommended by OT    Recommendations for Other Services       Precautions / Restrictions Precautions Precautions: Knee;Fall Precaution Comments: Reviewed precations with pt Required Braces or Orthoses: Knee Immobilizer - Right Restrictions Weight Bearing Restrictions: Yes RLE Weight Bearing: Weight bearing as tolerated      Mobility Bed Mobility                  Transfers Overall transfer level: Needs assistance Equipment used: Rolling walker (2 wheeled) Transfers: Sit to/from Stand Sit to Stand: Min guard;Min assist         General transfer comment: educated on technique.    Balance                                            ADL Overall ADL's : Needs assistance/impaired     Grooming: Oral care;Standing;Set up;Supervision/safety               Lower Body Dressing: Min guard;Sit to/from stand   Toilet Transfer: Min guard;Ambulation;RW;Minimal assistance (3 in 1 over commode)   Toileting- Clothing Manipulation and Hygiene: Sit to/from stand;Min guard       Functional mobility during ADLs: Min guard;Minimal assistance;Rolling walker General ADL Comments: Educated on dressing technique and benefit of reaching down to RLE for donning sock as it allows knee to bend. Educated on tub transfer techniques and options for shower chair. Recommended someone be with her for tub transfer and to not step over tub anytime soon. Explained that AE is available if ADLs become issue. Educated on use of bag on walker, safe  shoewear, and discussed rugs. Educated on knee immobilizer.     Vision                     Perception     Praxis      Pertinent Vitals/Pain Pain Assessment: 0-10 Pain Score: 6  Pain Location: left knee Pain Descriptors / Indicators: Sore;Throbbing Pain Intervention(s): Repositioned     Hand Dominance Right   Extremity/Trunk Assessment Upper Extremity Assessment Upper Extremity Assessment: Overall WFL for tasks assessed   Lower Extremity Assessment Lower Extremity Assessment: Defer to PT evaluation       Communication Communication Communication: No difficulties   Cognition Arousal/Alertness: Awake/alert Behavior During Therapy: WFL for tasks assessed/performed Overall Cognitive Status: Within Functional Limits for tasks assessed                     General Comments       Exercises       Shoulder Instructions      Home Living Family/patient expects to be discharged to:: Private residence Living Arrangements: Children Available Help at Discharge: Family;Available PRN/intermittently Type of Home: House Home Access: Stairs to enter [employer] of Steps: 3 Entrance Stairs-Rails: None Home Layout: One level     Bathroom Shower/Tub: Teacher, early years/pre: Standard Bathroom Accessibility: Yes  How Accessible: Accessible via walker Home Equipment: [place] - 2 wheels;Bedside commode          Prior Functioning/Environment Level of Independence: Independent             OT Diagnosis: Acute pain   OT Problem List: Decreased strength;Decreased knowledge of use of DME or AE;Decreased knowledge of precautions;Pain;Impaired balance (sitting and/or standing)   OT Treatment/Interventions: Self-care/ADL training;DME and/or AE instruction;Therapeutic activities;Patient/family education;Balance training    OT Goals(Current goals can be found in the care plan section) Acute Rehab OT Goals Patient Stated Goal: not stated OT  Goal Formulation: With patient Time For Goal Achievement: 08/23/14 Potential to Achieve Goals: Good ADL Goals Pt Will Perform Lower Body Dressing: with modified independence;sit to/from stand Pt Will Transfer to Toilet: with modified independence;ambulating (3 in 1 over commode) Pt Will Perform Tub/Shower Transfer: Tub transfer;ambulating;3 in 1;rolling walker Additional ADL Goal #1: Pt will independently perform bed mobility as precursor for ADLs.   OT Frequency: Min 2X/week   Barriers to D/C: Decreased caregiver support          Co-evaluation              End of Session Equipment Utilized During Treatment: Gait belt;Rolling walker;Right knee immobilizer CPM Right Knee CPM Right Knee: Off  Activity Tolerance: Patient tolerated treatment well Patient left: in chair;with call bell/phone within reach   Time: 0907-0935 OT Time Calculation (min): 28 min Charges:  OT General Charges $OT Visit: 1 Procedure OT Evaluation $Initial OT Evaluation Tier I: 1 Procedure OT Treatments $Self Care/Home Management : 8-22 mins G-CodesBenito Mccreedy OTR/L [phone] 08/16/2014, 9:48 AM

## 2014-08-16 NOTE — Discharge Instructions (Signed)
Diet: As you were doing prior to hospitalization   Activity: Increase activity slowly as tolerated  No lifting or driving for 6 weeks   Shower: May shower without a dressing on post op day #5, NO SOAKING in tub   Dressing: You may change your dressing on post op day #3.  Then change the dressing daily with sterile 4"x4"s gauze dressing  And TED hose for knees. Use paper tape to hold dressing in place  For hips. You may clean the incision with alcohol prior to redressing.   Weight Bearing: weight bearing as taught in physical therapy. Use a walker or  Crutches as instructed.   To prevent constipation: you may use a stool softener such as -  Colace ( over the counter) 100 mg by mouth twice a day  Drink plenty of fluids ( prune juice may be helpful) and high fiber foods  Miralax ( over the counter) for constipation as needed.   Precautions: If you experience chest pain or shortness of breath - call 911 immediately For transfer to the hospital emergency department!!  If you develop a fever greater that 101 F, purulent drainage from wound, increased redness or drainage from wound, or calf pain -- Call the office   Follow- Up Appointment: Please call for an appointment to be seen in 2 weeks  [place] - [phone]    Information on my medicine - XARELTO (Rivaroxaban)  This medication education was reviewed with me or my healthcare representative as part of my discharge preparation.  The pharmacist that spoke with me during my hospital stay was:    Why was Xarelto prescribed for you? Xarelto was prescribed for you to reduce the risk of blood clots forming after orthopedic surgery. The medical term for these abnormal blood clots is venous thromboembolism (VTE).  What do you need to know about xarelto ? Take your Xarelto ONCE DAILY at the same time every day. You may take it either with or without food.  If you have difficulty swallowing the tablet whole, you may crush it and  mix in applesauce just prior to taking your dose.  Take Xarelto exactly as prescribed by your doctor and DO NOT stop taking Xarelto without talking to the doctor who prescribed the medication.  Stopping without other VTE prevention medication to take the place of Xarelto may increase your risk of developing a clot.  After discharge, you should have regular check-up appointments with your healthcare provider that is prescribing your Xarelto.    What do you do if you miss a dose? If you miss a dose, take it as soon as you remember on the same day then continue your regularly scheduled once daily regimen the next day. Do not take two doses of Xarelto on the same day.   Important Safety Information A possible side effect of Xarelto is bleeding. You should call your healthcare provider right away if you experience any of the following:   Bleeding from an injury or your nose that does not stop.   Unusual colored urine (red or dark brown) or unusual colored stools (red or black).   Unusual bruising for unknown reasons.   A serious fall or if you hit your head (even if there is no bleeding).  Some medicines may interact with Xarelto and might increase your risk of bleeding while on Xarelto. To help avoid this, consult your healthcare provider or pharmacist prior to using any new prescription or non-prescription medications, including herbals, vitamins, non-steroidal anti-inflammatory drugs (  NSAIDs) and supplements.  This website has more information on Xarelto: [email or web address]

## 2014-08-16 NOTE — Progress Notes (Signed)
[facility] Progress Note Weekend Coverage  Subjective  Doing well Sitting in bedside chair  Finished eating lunch   Only question she asked me was in reference to her d/c pain meds  Review of Systems  Constitutional: Negative for fever and chills.  Eyes: Negative for blurred vision.  Respiratory: Negative for shortness of breath and wheezing.   Cardiovascular: Negative for chest pain and palpitations.  Gastrointestinal: Negative for nausea, vomiting and abdominal pain.  Musculoskeletal:       R knee is sore   Neurological: Negative for headaches.     Objective   BP 108/50  Pulse 77  Temp(Src) 98 F (36.7 C) (Oral)  Resp 16  SpO2 98%  Intake/Output     08/21 0701 - 08/22 0700 08/22 0701 - 08/23 0700   I.V. 1573.8    Total Intake 1573.8     Urine 650    Drains 500    Total Output 1150     Net +423.8          Urine Occurrence 1 x      Labs Results for Morgan Taylor, Morgan Taylor (MRN [id number]) as of 08/16/2014 12:51  Ref. Range 08/16/2014 06:15  Sodium Latest Range: 137-147 mEq/L 136 (L)  Potassium Latest Range: 3.7-5.3 mEq/L 3.8  Chloride Latest Range: 96-112 mEq/L 96  CO2 Latest Range: 19-32 mEq/L 27  BUN Latest Range: 6-23 mg/dL 11  Creatinine Latest Range: 0.50-1.10 mg/dL 0.92  Calcium Latest Range: 8.4-10.5 mg/dL 8.5  GFR calc non Af Amer Latest Range: >90 mL/min 71 (L)  GFR calc Af Amer Latest Range: >90 mL/min 82 (L)  Glucose Latest Range: 70-99 mg/dL 183 (H)  Anion gap Latest Range: 5-15  13  WBC Latest Range: 4.0-10.5 K/uL 8.1  RBC Latest Range: 3.87-5.11 MIL/uL 3.57 (L)  Hemoglobin Latest Range: 12.0-15.0 g/dL 10.6 (L)  HCT Latest Range: 36.0-46.0 % 34.3 (L)  MCV Latest Range: 78.0-100.0 fL 96.1  MCH Latest Range: 26.0-34.0 pg 29.7  MCHC Latest Range: 30.0-36.0 g/dL 30.9  RDW Latest Range: 11.5-15.5 % 13.9  Platelets Latest Range: 150-400 K/uL 195   CBG (last 3)   Recent Labs  08/15/14 2120 08/16/14 0645 08/16/14 1115  GLUCAP 169*  175* 143*     Exam  Gen: awake and alert, sitting in bedside chair  Lungs: clear anterior fields Cardiac: RRR, s1 and s2 Abd: soft, NTND, + BS Ext:       Right Lower Extremity   Dressing c/d/i  Drain patent  Distal motor and sensory functions intact  Sitting with knee flexed  Difficulty getting her fully extendend   + DP pulse  Compartments soft and NT    Assessment and Plan   POD/HD#: 1  53 y/o female s/p R TKA  1. R knee DJD s/p R TKA  WBAT  PT/OT    CPM  Ice prn  Dressing change tomorrow  Total knee precautions   2. DM  Sugars elevated   Pt on SSI  Home actos on hold  Will restart lantus and victoza   3. Medial issues  Home meds   4. Pain management:  Continue with current regimen  5. ABL anemia/Hemodynamics  Stable  Check CBC in am  6. DVT/PE prophylaxis:  xarelto 7. ID:   Completed periop abx   8. FEN/Foley/Lines:  CHO mod diet  KVO IVF  9. Dispo:  Continue with therapies   CPM, work on getting full extension   Not ready for dc, continue inpatient  care     Jari Pigg, PA-C [facility] [phone] (P) 08/16/2014 12:48 PM  **Disclaimer: This note may have been dictated with voice recognition software. Similar sounding words can inadvertently be transcribed and this note may contain transcription errors which may not have been corrected upon publication of note.**

## 2014-08-16 NOTE — Evaluation (Signed)
Physical Therapy Evaluation Patient Details Name: Morgan Taylor MRN: [id number] DOB: 04/01/1961 Today's Date: 08/16/2014   History of Present Illness  53 y.o. s/p right TKA.  Clinical Impression  Pt tolerated  Ambulation, very drowsy after return to bed. Pt will benefit from PT to address problems listed in note below.    Follow Up Recommendations Home health PT    Equipment Recommendations  Rolling walker with 5" wheels    Recommendations for Other Services       Precautions / Restrictions Precautions Precautions: Knee;Fall Precaution Comments: Reviewed precations with pt Required Braces or Orthoses: Knee Immobilizer - Right      Mobility  Bed Mobility Overal bed mobility: Needs Assistance Bed Mobility: Sit to Supine       Sit to supine: Min assist   General bed mobility comments: use of sheet to lift R leg onto bed  Transfers   Equipment used: Rolling walker (2 wheeled) Transfers: Sit to/from Stand Sit to Stand: Min guard         General transfer comment: cues for hand and R leg position from toilet/to bed  Ambulation/Gait Ambulation/Gait assistance: Min assist Ambulation Distance (Feet): 45 Feet Assistive device: Rolling walker (2 wheeled) Gait Pattern/deviations: Step-to pattern;Antalgic;Decreased stance time - right     General Gait Details: cues for sequence, cues for  attempts to  extend knee  during stance  Stairs            Wheelchair Mobility    Modified Rankin (Stroke Patients Only)       Balance                                             Pertinent Vitals/Pain Pain Score: 5  Pain Location: R knee Pain Descriptors / Indicators: Constant Pain Intervention(s): Premedicated before session;Repositioned;Ice applied    Home Living Family/patient expects to be discharged to:: Private residence Living Arrangements: Children Available Help at Discharge: Family;Available PRN/intermittently Type of Home:  House Home Access: Stairs to enter Entrance Stairs-Rails: None Entrance Stairs-Number of Steps: 3 Home Layout: One level Home Equipment: Walker - 2 wheels;Bedside commode      Prior Function Level of Independence: Independent               Hand Dominance        Extremity/Trunk Assessment   Upper Extremity Assessment: Overall WFL for tasks assessed           Lower Extremity Assessment: RLE deficits/detail RLE Deficits / Details: requires assist to   perform SLR       Communication   Communication: No difficulties  Cognition Arousal/Alertness: Lethargic;Suspect due to medications Behavior During Therapy: [facility] for tasks assessed/performed Overall Cognitive Status: Within Functional Limits for tasks assessed                      General Comments      Exercises Total Joint Exercises Ankle Circles/Pumps: Both;Supine;10 reps Quad Sets: AROM;Both;10 reps;Supine Heel Slides: AAROM;Right;10 reps;Supine Hip ABduction/ADduction: AROM;Right;10 reps;Supine Straight Leg Raises: AAROM;Right;10 reps;Supine Goniometric ROM: 15-60 passisve R knee      Assessment/Plan    PT Assessment Patient needs continued PT services  PT Diagnosis Difficulty walking;Acute pain   PT Problem List Decreased strength;Decreased range of motion;Decreased activity tolerance;Decreased mobility;Decreased knowledge of use of DME;Decreased safety awareness;Decreased knowledge of precautions;Pain  PT Treatment Interventions DME  instruction;Gait training;Stair training;Functional mobility training;Therapeutic activities;Therapeutic exercise;Patient/family education   PT Goals (Current goals can be found in the Care Plan section) Acute Rehab PT Goals Patient Stated Goal: i want to walk again, get the Left knee done PT Goal Formulation: With patient Time For Goal Achievement: 08/23/14 Potential to Achieve Goals: Good    Frequency 7X/week   Barriers to discharge        Co-evaluation                End of Session   Activity Tolerance: Patient tolerated treatment well Patient left: in bed;in CPM;with call bell/phone within reach Nurse Communication: Mobility status (for itching)         Time: 6381-7711 PT Time Calculation (min): 37 min   Charges:   PT Evaluation $Initial PT Evaluation Tier I: 1 Procedure PT Treatments $Gait Training: 8-22 mins $Therapeutic Exercise: 8-22 mins   PT G Codes:          Claretha Cooper 08/16/2014, 2:05 PM Tresa Endo PT [phone]

## 2014-08-17 DIAGNOSIS — M1710 Unilateral primary osteoarthritis, unspecified knee: Secondary | ICD-10-CM | POA: Diagnosis not present

## 2014-08-17 LAB — CBC
HCT: 31.7 % — ABNORMAL LOW (ref 36.0–46.0)
Hemoglobin: 10.4 g/dL — ABNORMAL LOW (ref 12.0–15.0)
MCH: 30 pg (ref 26.0–34.0)
MCHC: 32.8 g/dL (ref 30.0–36.0)
MCV: 91.4 fL (ref 78.0–100.0)
PLATELETS: 221 10*3/uL (ref 150–400)
RBC: 3.47 MIL/uL — AB (ref 3.87–5.11)
RDW: 13.8 % (ref 11.5–15.5)
WBC: 11.4 10*3/uL — AB (ref 4.0–10.5)

## 2014-08-17 LAB — GLUCOSE, CAPILLARY
GLUCOSE-CAPILLARY: 214 mg/dL — AB (ref 70–99)
GLUCOSE-CAPILLARY: 105 mg/dL — AB (ref 70–99)
GLUCOSE-CAPILLARY: 165 mg/dL — AB (ref 70–99)
Glucose-Capillary: 165 mg/dL — ABNORMAL HIGH (ref 70–99)

## 2014-08-17 NOTE — Progress Notes (Signed)
[facility] Progress Note Weekend Coverage  Subjective  Pain tolerable No new issues No BM, minimal flatus Tolerating diet   Review of Systems  Constitutional: Negative for fever and chills.  Eyes: Negative for blurred vision and double vision.  Respiratory: Negative for shortness of breath and wheezing.   Cardiovascular: Negative for chest pain and palpitations.  Gastrointestinal: Negative for nausea and vomiting.  Neurological: Negative for tingling, sensory change and headaches.     Objective   BP 110/56  Pulse 75  Temp(Src) 98.2 F (36.8 C) (Oral)  Resp 17  SpO2 95%  Intake/Output     08/22 0701 - 08/23 0700 08/23 0701 - 08/24 0700   P.O. 1920    I.V. 246.7    Total Intake 2166.7     Urine 500    Drains 120    Total Output 620     Net +1546.7          Urine Occurrence 5 x      Labs  Results for Morgan Taylor, Morgan Taylor (MRN [id number]) as of 08/17/2014 11:20  Ref. Range 08/17/2014 04:32  WBC Latest Range: 4.0-10.5 K/uL 11.4 (H)  RBC Latest Range: 3.87-5.11 MIL/uL 3.47 (L)  Hemoglobin Latest Range: 12.0-15.0 g/dL 10.4 (L)  HCT Latest Range: 36.0-46.0 % 31.7 (L)  MCV Latest Range: 78.0-100.0 fL 91.4  MCH Latest Range: 26.0-34.0 pg 30.0  MCHC Latest Range: 30.0-36.0 g/dL 32.8  RDW Latest Range: 11.5-15.5 % 13.8  Platelets Latest Range: 150-400 K/uL 221   CBG (last 3)   Recent Labs  08/16/14 1644 08/16/14 2220 08/17/14 0715  GLUCAP 187* 216* 214*     Exam  Gen: awake and alert, sitting in bed Lungs: clear anterior fields Cardiac: RRR, s1 and s2 Abd: soft, NTND, + BS Ext:        Right Lower Extremity               Dressing c/d/i  Dressing changed and drain removed              incision looks great, no drainage or signs of infection              Distal motor and sensory functions intact             Sitting with knee flexed- lacks about 10 degrees              Difficulty getting her fully extendend               + DP pulse  Compartments soft and NT  Assessment and Plan   POD/HD#: 2   53 y/o female s/p R TKA  1. R knee DJD s/p R TKA             WBAT             PT/OT                          CPM             Ice prn             Dressing changed             Total knee precautions   Zero knee bone foam when in bed and in chair   2. DM             Sugars elevated  Pt on SSI             continue to hold actos              Will restart lantus and victoza   3. Medial issues             Home meds   4. Pain management:             Continue with current regimen  5. ABL anemia/Hemodynamics             Stable             Check CBC in am  6. DVT/PE prophylaxis:             xarelto 7. ID:               Completed periop abx    8. FEN/Foley/Lines:             CHO mod diet            NSL IV  Advance bowel regimen   9. Dispo:             Continue with therapies               CPM, work on getting full extension               Not ready for dc, continue inpatient care     Jari Pigg, PA-C [facility] [phone] (P) 08/17/2014 11:18 AM  **Disclaimer: This note may have been dictated with voice recognition software. Similar sounding words can inadvertently be transcribed and this note may contain transcription errors which may not have been corrected upon publication of note.**

## 2014-08-17 NOTE — Progress Notes (Addendum)
Occupational Therapy Treatment Patient Details Name: Morgan Taylor MRN: [id number] DOB: 04/08/1961 Today's Date: 08/17/2014    History of present illness 53 y.o. s/p right TKA.   OT comments  Pt ambulated some in hallway today and moving well. Education provided to pt.  Follow Up Recommendations  No OT follow up;Supervision - Intermittent    Equipment Recommendations  None recommended by OT    Recommendations for Other Services      Precautions / Restrictions Precautions Precautions: Knee;Fall Precaution Comments: Reviewed precations with pt Required Braces or Orthoses: Knee Immobilizer - Right Restrictions Weight Bearing Restrictions: Yes RLE Weight Bearing: Weight bearing as tolerated       Mobility Bed Mobility Overal bed mobility: Needs Assistance Bed Mobility: Supine to Sit;Sit to Supine     Supine to sit: Supervision Sit to supine: Supervision   General bed mobility comments: Elevated bed to simulate home. No physical assist needed.  Transfers Overall transfer level: Needs assistance Equipment used: Rolling walker (2 wheeled) Transfers: Sit to/from Stand Sit to Stand: Min guard              Balance                                   ADL Overall ADL's : Needs assistance/impaired     Grooming: Wash/dry hands;Supervision/safety;Standing;Wash/dry face;Set up               Lower Body Dressing: Min guard;Sit to/from stand   Toilet Transfer: Min guard;Ambulation;RW (3 in 1 over commode)   Toileting- Clothing Manipulation and Hygiene: Supervision/safety;Sit to/from stand       Functional mobility during ADLs: Min guard;Rolling walker;Supervision/safety-cues for sequencing/relax shoulders General ADL Comments: Practiced LB dressing and explained benefit of reaching to RLE. Educated on dressing technique. Educated on tub transfer technique and recommended not stepping over tub. Educated on safety tips for home. Discussed use of  reacher.      Vision                     Perception     Praxis      Cognition   Behavior During Therapy: WFL for tasks assessed/performed Overall Cognitive Status: Within Functional Limits for tasks assessed                       Extremity/Trunk Assessment               Exercises     Shoulder Instructions       General Comments      Pertinent Vitals/ Pain       Pain Assessment: 0-10 Pain Score: 6  Pain Location: Right knee Pain Descriptors / Indicators: Burning;Throbbing Pain Intervention(s): Other (comment) (nurse notified for meds)  Home Living                                          Prior Functioning/Environment              Frequency Min 2X/week     Progress Toward Goals  OT Goals(current goals can now be found in the care plan section)  Progress towards OT goals: Progressing toward goals  Acute Rehab OT Goals Patient Stated Goal: not stated OT Goal Formulation: With patient Time For Goal Achievement: 08/23/14 Potential to  Achieve Goals: Good ADL Goals Pt Will Perform Lower Body Dressing: with modified independence;sit to/from stand Pt Will Transfer to Toilet: with modified independence;ambulating (3 in 1 over commode) Pt Will Perform Tub/Shower Transfer: Tub transfer;ambulating;3 in 1;rolling walker Additional ADL Goal #1: Pt will independently perform bed mobility as precursor for ADLs.   Plan Discharge plan remains appropriate    Co-evaluation                 End of Session Equipment Utilized During Treatment: Gait belt;Rolling walker;Right knee immobilizer   Activity Tolerance Patient tolerated treatment well   Patient Left in bed;with call bell/phone within reach   Nurse Communication Patient requests pain meds        Time: 9150-5697 OT Time Calculation (min): 31 min  Charges: OT General Charges $OT Visit: 1 Procedure OT Treatments $Self Care/Home Management : 8-22  mins $Therapeutic Activity: 8-22 mins  Benito Mccreedy OTR/L [phone] 08/17/2014, 10:57 AM

## 2014-08-17 NOTE — Progress Notes (Signed)
Orthopedic Tech Progress Note Patient Details:  Morgan Taylor January 09, 1961 [id number]  Ortho Devices Ortho Device/Splint Location: foot roll   Cammer, Theodoro Parma 08/17/2014, 1:13 PM

## 2014-08-17 NOTE — Progress Notes (Signed)
Physical Therapy Treatment Patient Details Name: Morgan Taylor MRN: [id number] DOB: 1961/06/02 Today's Date: 2014-09-04    History of Present Illness 53 y.o. s/p right TKA.    PT Comments    Pt refusing out of bed due to pain and too cold. Reports she has been up already today with OT and with nursing to bathroom. Agreeable to there-ex only in bed. Limited knee flexion (approx 40-50 degrees only with assistance). Left in knee extension stretch in bed with foam under heels (footsie roll has been ordered, not in room at this time).   Follow Up Recommendations  Home health PT     Equipment Recommendations  Rolling walker with 5" wheels       Precautions / Restrictions Precautions Precautions: Fall Precaution Comments: Reviewed precations with pt- no pillow under knee. Required Braces or Orthoses: Knee Immobilizer - Right Restrictions Weight Bearing Restrictions: Yes RLE Weight Bearing: Weight bearing as tolerated          Cognition Arousal/Alertness: Awake/alert Behavior During Therapy: WFL for tasks assessed/performed Overall Cognitive Status: Within Functional Limits for tasks assessed                      Exercises Total Joint Exercises Ankle Circles/Pumps: AROM;Both;10 reps;Supine Quad Sets: Strengthening;Right;10 reps;Supine Short Arc Quad: AAROM;Strengthening;Right;10 reps;Supine Heel Slides: AAROM;Strengthening;Right;10 reps;Supine Hip ABduction/ADduction: AAROM;Strengthening;Right;10 reps;Supine Straight Leg Raises: AAROM;Strengthening;Right;10 reps;Supine        Pertinent Vitals/Pain Pain Assessment: 0-10 Pain Score: 8  Pain Location: right knee Pain Descriptors / Indicators: Sore;Throbbing;Tender;Aching Pain Intervention(s): Limited activity within patient's tolerance;Premedicated before session;Repositioned;Ice applied           PT Goals (current goals can now be found in the care plan section) Acute Rehab PT Goals Patient Stated Goal: not  stated PT Goal Formulation: With patient Time For Goal Achievement: 08/23/14 Potential to Achieve Goals: Good    Frequency  7X/week    PT Plan Current plan remains appropriate       End of Session   Activity Tolerance: Patient limited by pain Patient left: in bed;with call bell/phone within reach     Time: 1140-1155 PT Time Calculation (min): 15 min  Charges:  $Therapeutic Exercise: 8-22 mins                    G Codes:      Willow Ora 2014-09-04, 12:42 PM  Willow Ora, PTA Office- [phone]

## 2014-08-18 LAB — GLUCOSE, CAPILLARY
Glucose-Capillary: 140 mg/dL — ABNORMAL HIGH (ref 70–99)
Glucose-Capillary: 147 mg/dL — ABNORMAL HIGH (ref 70–99)

## 2014-08-18 LAB — CBC
HCT: 28.7 % — ABNORMAL LOW (ref 36.0–46.0)
Hemoglobin: 9.5 g/dL — ABNORMAL LOW (ref 12.0–15.0)
MCH: 30 pg (ref 26.0–34.0)
MCHC: 33.1 g/dL (ref 30.0–36.0)
MCV: 90.5 fL (ref 78.0–100.0)
Platelets: 219 10*3/uL (ref 150–400)
RBC: 3.17 MIL/uL — ABNORMAL LOW (ref 3.87–5.11)
RDW: 13.7 % (ref 11.5–15.5)
WBC: 9.5 10*3/uL (ref 4.0–10.5)

## 2014-08-18 MED ORDER — OXYCODONE HCL ER 20 MG PO T12A: 20.0000 mg | EXTENDED_RELEASE_TABLET | Freq: Two times a day (BID) | ORAL | Status: DC

## 2014-08-18 MED ORDER — OXYCODONE HCL ER 10 MG PO T12A: 10.0000 mg | EXTENDED_RELEASE_TABLET | Freq: Two times a day (BID) | ORAL | Status: DC

## 2014-08-18 MED ORDER — OXYCODONE HCL ER 10 MG PO T12A
10.0000 mg | EXTENDED_RELEASE_TABLET | Freq: Two times a day (BID) | ORAL | Status: DC
  Administered 2014-08-18: 10 mg via ORAL
  Filled 2014-08-18: qty 1

## 2014-08-18 NOTE — Progress Notes (Signed)
Physical Therapy Treatment Patient Details Name: Morgan Taylor MRN: [id number] DOB: March 08, 1961 Today's Date: 08/18/2014    History of Present Illness 53 y.o. s/p right TKA.    PT Comments    Patient making slower progress but able to complete stair training this morning. Patient stated that she was ready to DC home and had assistance as needed.   Follow Up Recommendations  Home health PT     Equipment Recommendations  Rolling walker with 5" wheels    Recommendations for Other Services       Precautions / Restrictions Precautions Required Braces or Orthoses: Knee Immobilizer - Right Restrictions RLE Weight Bearing: Weight bearing as tolerated    Mobility  Bed Mobility Overal bed mobility: Modified Independent                Transfers Overall transfer level: Needs assistance Equipment used: Rolling walker (2 wheeled) Transfers: Sit to/from Stand Sit to Stand: Supervision         General transfer comment: Cues for best hand placement  Ambulation/Gait Ambulation/Gait assistance: Supervision Ambulation Distance (Feet): 60 Feet Assistive device: Rolling walker (2 wheeled) Gait Pattern/deviations: Step-to pattern;Decreased step length - right;Decreased step length - left     General Gait Details: Cues for RW positioning and for heel strike on R LE.    Stairs Stairs: Yes Stairs assistance: Min assist Stair Management: Step to pattern;With walker;No rails;Backwards Number of Stairs: 2 General stair comments: Patient able to review technique with Min A for positioning and support of RW.   Wheelchair Mobility    Modified Rankin (Stroke Patients Only)       Balance                                    Cognition Arousal/Alertness: Awake/alert Behavior During Therapy: WFL for tasks assessed/performed Overall Cognitive Status: Within Functional Limits for tasks assessed                      Exercises      General Comments        Pertinent Vitals/Pain Pain Assessment: 0-10 Pain Score: 8  Pain Location: R knee Pain Descriptors / Indicators: Aching Pain Intervention(s): Monitored during session;Limited activity within patient's tolerance    Home Living                      Prior Function            PT Goals (current goals can now be found in the care plan section) Progress towards PT goals: Progressing toward goals    Frequency  7X/week    PT Plan Current plan remains appropriate    Co-evaluation             End of Session Equipment Utilized During Treatment: Gait belt Activity Tolerance: Patient tolerated treatment well Patient left: in chair;with call bell/phone within reach     Time: 0930-0957 PT Time Calculation (min): 27 min  Charges:  $Gait Training: 23-37 mins                    G Codes:      Jacqualyn Posey 08/18/2014, 12:14 PM 08/18/2014 Jacqualyn Posey PTA [phone] pager [phone] office

## 2014-08-18 NOTE — Care Management Note (Signed)
CARE MANAGEMENT NOTE 08/18/2014  Patient:  MAKARI, SANKO   Account Number:  [id number]  Date Initiated:  08/15/2014  Documentation initiated by:  Ricki Miller  Subjective/Objective Assessment:   53 yr old female s/p right total knee replacement.     Action/Plan:   PT/OT eval.  Patient preoperatively setup with [facility].  No changes.HAs family support at discharge   Anticipated DC Date:  08/18/2014   Anticipated DC Plan:  [place]  CM consult      [place]   Choice offered to / List presented to:  C-1 Patient   DME arranged  [facility]  3-N-1  CPM      DME agency  [employer]     [place] arranged  HH-2 PT      [place]   Status of service:  Completed, signed off Medicare Important Message given?  NA - LOS <3 / Initial given by admissions (If response is "NO", the following Medicare IM given date fields will be blank) Date Medicare IM given:   Medicare IM given by:   Date Additional Medicare IM given:   Additional Medicare IM given by:    Discharge Disposition:  [place]  Per UR Regulation:  Reviewed for med. necessity/level of care/duration of stay

## 2014-08-18 NOTE — Progress Notes (Signed)
Subjective: 3 Days Post-Op Procedure(s) (LRB): RIGHT TOTAL KNEE ARTHROPLASTY (Right) Patient reports pain as moderate.    Objective: Vital signs in last 24 hours: Temp:  [98.1 F (36.7 C)-100.2 F (37.9 C)] 98.4 F (36.9 C) (08/24 0628) Pulse Rate:  [75-108] 91 (08/24 0628) Resp:  [16-18] 18 (08/24 0628) BP: (110-132)/(56-64) 129/64 mmHg (08/24 0628) SpO2:  [90 %-97 %] 95 % (08/24 0628)  Intake/Output from previous day: 08/23 0701 - 08/24 0700 In: 1320 [P.O.:1200; I.V.:120] Out: 100 [Drains:100] Intake/Output this shift:     Recent Labs  08/16/14 0615 08/17/14 0432 08/18/14 0507  HGB 10.6* 10.4* 9.5*    Recent Labs  08/17/14 0432 08/18/14 0507  WBC 11.4* 9.5  RBC 3.47* 3.17*  HCT 31.7* 28.7*  PLT 221 219    Recent Labs  08/16/14 0615  NA 136*  K 3.8  CL 96  CO2 27  BUN 11  CREATININE 0.92  GLUCOSE 183*  CALCIUM 8.5   No results found for this basename: LABPT, INR,  in the last 72 hours  Neurovascular intact Sensation intact distally Intact pulses distally Dorsiflexion/Plantar flexion intact Incision: dressing C/D/I  Assessment/Plan: 3 Days Post-Op Procedure(s) (LRB): RIGHT TOTAL KNEE ARTHROPLASTY (Right) Up with therapy Discharge home with home health This AM wean off of Dilaudid IV, new order for Oxycontin SR and rx for home.    Chriss Czar 08/18/2014, 8:24 AM

## 2014-08-18 NOTE — Care Management Note (Signed)
CARE MANAGEMENT NOTE 08/18/2014  Patient:  Morgan Taylor, Morgan Taylor   Account Number:  [id number]  Date Initiated:  08/15/2014  Documentation initiated by:  Ricki Miller  Subjective/Objective Assessment:   53 yr old female s/p right total knee replacement.     Action/Plan:   PT/OT eval.  Patient preoperatively setup with [facility].  No changes.HAs family support at discharge   Anticipated DC Date:  08/18/2014   Anticipated DC Plan:  [place]  CM consult      [place]   Choice offered to / List presented to:  C-1 Patient   DME arranged  [facility]  3-N-1  CPM      DME agency  [employer]     [place] arranged  HH-2 PT      [place]   Status of service:  Completed, signed off Medicare Important Message given?  YES (If response is "NO", the following Medicare IM given date fields will be blank) Date Medicare IM given:  08/18/2014 Medicare IM given by:  Ricki Miller Date Additional Medicare IM given:   Additional Medicare IM given by:    Discharge Disposition:  [place]  Per UR Regulation:  Reviewed for med. necessity/level of care/duration of stay

## 2014-08-18 NOTE — Progress Notes (Signed)
Pt discharged to home. D/c instructions given, no questions verbalized. Vitals stable. 

## 2014-08-19 ENCOUNTER — Encounter (HOSPITAL_COMMUNITY): Payer: Self-pay | Admitting: Orthopedic Surgery

## 2014-08-21 NOTE — Discharge Summary (Signed)
PATIENT ID: Morgan Taylor        MRN:  [id number]          DOB/AGE: 1961-09-20 / 53 y.o.    DISCHARGE SUMMARY  ADMISSION DATE:    08/15/2014 DISCHARGE DATE:   08/18/2014  ADMISSION DIAGNOSIS: DJD right knee OA RIGHT KNEE    DISCHARGE DIAGNOSIS:  DJD right knee    ADDITIONAL DIAGNOSIS: Active Problems:   Osteoarthritis of right knee  Past Medical History  Diagnosis Date  . Diabetes mellitus   . Hypertension   . Arthritis   . Family history of anesthesia complication     Patients son has difficulty waking up after anesthesia  . Hypothyroidism   . Anxiety   . Depression   . Cancer 2009    lumpectomy  . Hyperlipemia   . GERD (gastroesophageal reflux disease)     PROCEDURE: Procedure(s): RIGHT TOTAL KNEE ARTHROPLASTY Right on 08/15/2014  CONSULTS: none     HISTORY:  See H&P in chart  HOSPITAL COURSE:  Morgan Taylor is a 53 y.o. admitted on 08/15/2014 and found to have a diagnosis of DJD right knee.  After appropriate laboratory studies were obtained  they were taken to the operating room on 08/15/2014 and underwent  Procedure(s): RIGHT TOTAL KNEE ARTHROPLASTY  Right.   They were given perioperative antibiotics:  Anti-infectives   Start     Dose/Rate Route Frequency Ordered Stop   08/15/14 1700  ceFAZolin (ANCEF) IVPB 1 g/50 mL premix     1 g 100 mL/hr over 30 Minutes Intravenous Every 6 hours 08/15/14 1511 08/16/14 0038   08/15/14 0600  ceFAZolin (ANCEF) IVPB 2 g/50 mL premix     2 g 100 mL/hr over 30 Minutes Intravenous On call to O.R. 08/14/14 1426 08/15/14 1009    .  Tolerated the procedure well.    POD #1, allowed out of bed to a chair.  PT for ambulation and exercise program. IV saline locked.  O2 discontionued.  POD #2, continued PT and ambulation.   Hemovac pulled. . The remainder of the hospital course was dedicated to ambulation and strengthening.   The patient was discharged on 3 days post op in  Stable condition.  Blood products  given:none  DIAGNOSTIC STUDIES: Recent vital signs: No data found.      Recent laboratory studies:  Recent Labs  08/16/14 0615 08/17/14 0432 08/18/14 0507  WBC 8.1 11.4* 9.5  HGB 10.6* 10.4* 9.5*  HCT 34.3* 31.7* 28.7*  PLT 195 221 219    Recent Labs  08/16/14 0615  NA 136*  K 3.8  CL 96  CO2 27  BUN 11  CREATININE 0.92  GLUCOSE 183*  CALCIUM 8.5   Lab Results  Component Value Date   INR 1.06 08/07/2014     Recent Radiographic Studies :  Dg Chest 2 View  08/07/2014   CLINICAL DATA:  Preop for right knee replacement  EXAM: CHEST  2 VIEW  COMPARISON:  01/09/2007  FINDINGS: Cardiomediastinal silhouette is stable. No acute infiltrate or pleural effusion. No pulmonary edema. Mild degenerative changes mid thoracic spine.  IMPRESSION: No active cardiopulmonary disease.   Electronically Signed   By: Lahoma Crocker M.D.   On: 08/07/2014 14:17    DISCHARGE INSTRUCTIONS:   DISCHARGE MEDICATIONS:     Medication List    STOP taking these medications       diclofenac 75 MG EC tablet  Commonly known as:  VOLTAREN  HYDROcodone-acetaminophen 10-325 MG per tablet  Commonly known as:  NORCO      TAKE these medications       amLODipine 10 MG tablet  Commonly known as:  NORVASC  Take 10 mg by mouth every morning.     anastrozole 1 MG tablet  Commonly known as:  ARIMIDEX  Take 1 mg by mouth daily.     atorvastatin 20 MG tablet  Commonly known as:  LIPITOR  Take 20 mg by mouth every morning.     clonazePAM 1 MG tablet  Commonly known as:  KLONOPIN  Take 1 mg by mouth at bedtime.     FLUoxetine HCl 60 MG Tabs  Take 60 mg by mouth daily.     insulin glargine 100 UNIT/ML injection  Commonly known as:  LANTUS  Inject 24 Units into the skin at bedtime.     levothyroxine 75 MCG tablet  Commonly known as:  SYNTHROID, LEVOTHROID  Take 75 mcg by mouth daily before breakfast.     losartan 100 MG tablet  Commonly known as:  COZAAR  Take 100 mg by mouth every  morning.     metoprolol succinate 50 MG 24 hr tablet  Commonly known as:  TOPROL-XL  Take 50 mg by mouth every morning. Take with or immediately following a meal.     omeprazole 20 MG capsule  Commonly known as:  PRILOSEC  Take 20 mg by mouth every morning.     oxyCODONE 5 MG immediate release tablet  Commonly known as:  ROXICODONE  1-2 tabs po q4-6hrs prn pain     OxyCODONE 10 mg T12a 12 hr tablet  Commonly known as:  OXYCONTIN  Take 1 tablet (10 mg total) by mouth every 12 (twelve) hours.     pioglitazone 15 MG tablet  Commonly known as:  ACTOS  Take 15 mg by mouth every morning.     rivaroxaban 10 MG Tabs tablet  Commonly known as:  XARELTO  Take 1 tablet (10 mg total) by mouth daily.     topiramate 100 MG tablet  Commonly known as:  TOPAMAX  Take 100 mg by mouth at bedtime.     VICTOZA 18 MG/3ML Sopn  Generic drug:  Liraglutide  Inject 1.8 mg into the skin daily.        FOLLOW UP VISIT:       Follow-up Information   Follow up with CAFFREY JR,W D, MD. Schedule an appointment as soon as possible for a visit in 2 weeks.   Specialty:  Orthopedic Surgery   Contact information:   [address] [place] [address] [phone]       DISPOSITION:   Home  CONDITION:  Stable   Mike Craze. [place], [place] [phone]  08/21/2014 1:00 PM

## 2014-08-25 NOTE — Anesthesia Postprocedure Evaluation (Signed)
  Anesthesia Post-op Note  Patient: Morgan Taylor  Procedure(s) Performed: Procedure(s): RIGHT TOTAL KNEE ARTHROPLASTY (Right)  Patient Location: PACU  Anesthesia Type:General  Level of Consciousness: awake and alert   Airway and Oxygen Therapy: Patient Spontanous Breathing  Post-op Pain: mild  Post-op Assessment: Post-op Vital signs reviewed and Patient's Cardiovascular Status Stable  Post-op Vital Signs: stable  Last Vitals:  Filed Vitals:   08/18/14 1415  BP: 114/56  Pulse: 89  Temp: 36.7 C  Resp: 18    Complications: No apparent anesthesia complications

## 2014-09-03 ENCOUNTER — Ambulatory Visit: Payer: Medicare Other | Attending: Orthopedic Surgery | Admitting: Physical Therapy

## 2014-09-03 DIAGNOSIS — Z96659 Presence of unspecified artificial knee joint: Secondary | ICD-10-CM | POA: Insufficient documentation

## 2014-09-03 DIAGNOSIS — M25669 Stiffness of unspecified knee, not elsewhere classified: Secondary | ICD-10-CM | POA: Diagnosis not present

## 2014-09-03 DIAGNOSIS — R262 Difficulty in walking, not elsewhere classified: Secondary | ICD-10-CM | POA: Insufficient documentation

## 2014-09-03 DIAGNOSIS — IMO0001 Reserved for inherently not codable concepts without codable children: Secondary | ICD-10-CM | POA: Insufficient documentation

## 2014-09-03 DIAGNOSIS — M25569 Pain in unspecified knee: Secondary | ICD-10-CM | POA: Diagnosis not present

## 2014-09-03 DIAGNOSIS — R609 Edema, unspecified: Secondary | ICD-10-CM | POA: Diagnosis not present

## 2014-09-08 ENCOUNTER — Ambulatory Visit: Payer: Medicare Other | Admitting: Physical Therapy

## 2014-09-08 DIAGNOSIS — IMO0001 Reserved for inherently not codable concepts without codable children: Secondary | ICD-10-CM | POA: Diagnosis not present

## 2014-09-10 ENCOUNTER — Ambulatory Visit: Payer: Medicare Other | Admitting: Physical Therapy

## 2014-09-10 DIAGNOSIS — IMO0001 Reserved for inherently not codable concepts without codable children: Secondary | ICD-10-CM | POA: Diagnosis not present

## 2014-09-12 ENCOUNTER — Ambulatory Visit: Payer: Medicare Other | Admitting: Physical Therapy

## 2014-09-12 DIAGNOSIS — IMO0001 Reserved for inherently not codable concepts without codable children: Secondary | ICD-10-CM | POA: Diagnosis not present

## 2014-09-15 ENCOUNTER — Other Ambulatory Visit: Payer: Self-pay | Admitting: Adult Health

## 2014-09-15 DIAGNOSIS — Z853 Personal history of malignant neoplasm of breast: Secondary | ICD-10-CM

## 2014-09-16 ENCOUNTER — Ambulatory Visit: Payer: Medicare Other | Admitting: Physical Therapy

## 2014-09-16 DIAGNOSIS — IMO0001 Reserved for inherently not codable concepts without codable children: Secondary | ICD-10-CM | POA: Diagnosis not present

## 2014-09-17 ENCOUNTER — Ambulatory Visit: Payer: Medicare Other | Admitting: Physical Therapy

## 2014-09-18 ENCOUNTER — Ambulatory Visit: Payer: Medicare Other | Admitting: Physical Therapy

## 2014-09-18 DIAGNOSIS — IMO0001 Reserved for inherently not codable concepts without codable children: Secondary | ICD-10-CM | POA: Diagnosis not present

## 2014-09-22 ENCOUNTER — Ambulatory Visit: Payer: Medicare Other | Admitting: Physical Therapy

## 2014-09-22 DIAGNOSIS — IMO0001 Reserved for inherently not codable concepts without codable children: Secondary | ICD-10-CM | POA: Diagnosis not present

## 2014-09-24 ENCOUNTER — Ambulatory Visit: Payer: Medicare Other | Admitting: Physical Therapy

## 2014-09-24 DIAGNOSIS — IMO0001 Reserved for inherently not codable concepts without codable children: Secondary | ICD-10-CM | POA: Diagnosis not present

## 2014-09-26 ENCOUNTER — Ambulatory Visit: Payer: Medicare Other | Attending: Orthopedic Surgery | Admitting: Physical Therapy

## 2014-09-26 DIAGNOSIS — R609 Edema, unspecified: Secondary | ICD-10-CM | POA: Diagnosis not present

## 2014-09-26 DIAGNOSIS — E119 Type 2 diabetes mellitus without complications: Secondary | ICD-10-CM | POA: Diagnosis not present

## 2014-09-26 DIAGNOSIS — I10 Essential (primary) hypertension: Secondary | ICD-10-CM | POA: Insufficient documentation

## 2014-09-26 DIAGNOSIS — M25561 Pain in right knee: Secondary | ICD-10-CM | POA: Diagnosis present

## 2014-09-26 DIAGNOSIS — M25661 Stiffness of right knee, not elsewhere classified: Secondary | ICD-10-CM | POA: Insufficient documentation

## 2014-09-26 DIAGNOSIS — M1990 Unspecified osteoarthritis, unspecified site: Secondary | ICD-10-CM | POA: Diagnosis not present

## 2014-09-26 DIAGNOSIS — Z96651 Presence of right artificial knee joint: Secondary | ICD-10-CM | POA: Insufficient documentation

## 2014-09-26 DIAGNOSIS — R262 Difficulty in walking, not elsewhere classified: Secondary | ICD-10-CM | POA: Diagnosis present

## 2014-09-29 ENCOUNTER — Ambulatory Visit: Payer: Medicare Other | Admitting: Physical Therapy

## 2014-09-29 ENCOUNTER — Encounter: Payer: Medicare Other | Admitting: Physical Therapy

## 2014-09-30 ENCOUNTER — Ambulatory Visit: Payer: Medicare Other | Admitting: Physical Therapy

## 2014-10-01 ENCOUNTER — Ambulatory Visit: Payer: Medicare Other | Admitting: Physical Therapy

## 2014-10-01 DIAGNOSIS — M25561 Pain in right knee: Secondary | ICD-10-CM | POA: Diagnosis not present

## 2014-10-03 ENCOUNTER — Ambulatory Visit: Payer: Medicare Other | Admitting: Physical Therapy

## 2014-10-03 ENCOUNTER — Other Ambulatory Visit: Payer: Self-pay | Admitting: *Deleted

## 2014-10-03 DIAGNOSIS — C50519 Malignant neoplasm of lower-outer quadrant of unspecified female breast: Secondary | ICD-10-CM

## 2014-10-03 DIAGNOSIS — M25561 Pain in right knee: Secondary | ICD-10-CM | POA: Diagnosis not present

## 2014-10-06 ENCOUNTER — Other Ambulatory Visit: Payer: Medicare Other

## 2014-10-06 ENCOUNTER — Ambulatory Visit: Payer: Medicare Other | Admitting: Physical Therapy

## 2014-10-06 ENCOUNTER — Telehealth: Payer: Self-pay | Admitting: Adult Health

## 2014-10-06 ENCOUNTER — Ambulatory Visit: Payer: Medicare Other | Admitting: Adult Health

## 2014-10-06 DIAGNOSIS — M25561 Pain in right knee: Secondary | ICD-10-CM | POA: Diagnosis not present

## 2014-10-06 NOTE — Telephone Encounter (Signed)
, °

## 2014-10-08 ENCOUNTER — Ambulatory Visit: Payer: Medicare Other | Admitting: Physical Therapy

## 2014-10-08 ENCOUNTER — Ambulatory Visit
Admission: RE | Admit: 2014-10-08 | Discharge: 2014-10-08 | Disposition: A | Discharge status: Home / Self Care | Payer: Medicare Other | Source: Ambulatory Visit | Attending: Adult Health | Admitting: Adult Health

## 2014-10-08 DIAGNOSIS — M25561 Pain in right knee: Secondary | ICD-10-CM | POA: Diagnosis not present

## 2014-10-08 DIAGNOSIS — Z853 Personal history of malignant neoplasm of breast: Secondary | ICD-10-CM

## 2014-10-10 ENCOUNTER — Ambulatory Visit: Payer: Medicare Other | Admitting: Physical Therapy

## 2014-10-10 DIAGNOSIS — M25561 Pain in right knee: Secondary | ICD-10-CM | POA: Diagnosis not present

## 2014-10-13 ENCOUNTER — Other Ambulatory Visit (HOSPITAL_BASED_OUTPATIENT_CLINIC_OR_DEPARTMENT_OTHER): Payer: Medicare Other

## 2014-10-13 ENCOUNTER — Encounter: Payer: Self-pay | Admitting: Adult Health

## 2014-10-13 ENCOUNTER — Telehealth: Payer: Self-pay | Admitting: Adult Health

## 2014-10-13 ENCOUNTER — Ambulatory Visit (HOSPITAL_BASED_OUTPATIENT_CLINIC_OR_DEPARTMENT_OTHER): Payer: Medicare Other | Admitting: Adult Health

## 2014-10-13 ENCOUNTER — Ambulatory Visit: Payer: Medicare Other | Admitting: Physical Therapy

## 2014-10-13 VITALS — BP 158/88 | HR 74 | Temp 98.7°F | Resp 18 | Ht 62.0 in | Wt 167.3 lb

## 2014-10-13 DIAGNOSIS — C50519 Malignant neoplasm of lower-outer quadrant of unspecified female breast: Secondary | ICD-10-CM

## 2014-10-13 DIAGNOSIS — C50912 Malignant neoplasm of unspecified site of left female breast: Secondary | ICD-10-CM

## 2014-10-13 DIAGNOSIS — M25561 Pain in right knee: Secondary | ICD-10-CM | POA: Diagnosis not present

## 2014-10-13 DIAGNOSIS — Z23 Encounter for immunization: Secondary | ICD-10-CM

## 2014-10-13 DIAGNOSIS — Z170 Estrogen receptor positive status [ER+]: Secondary | ICD-10-CM

## 2014-10-13 DIAGNOSIS — E2839 Other primary ovarian failure: Secondary | ICD-10-CM

## 2014-10-13 LAB — CBC WITH DIFFERENTIAL/PLATELET
BASO%: 0.3 % (ref 0.0–2.0)
Basophils Absolute: 0 10*3/uL (ref 0.0–0.1)
EOS%: 1.5 % (ref 0.0–7.0)
Eosinophils Absolute: 0.1 10*3/uL (ref 0.0–0.5)
HEMATOCRIT: 39.1 % (ref 34.8–46.6)
HGB: 12.6 g/dL (ref 11.6–15.9)
LYMPH%: 40.1 % (ref 14.0–49.7)
MCH: 28.6 pg (ref 25.1–34.0)
MCHC: 32.2 g/dL (ref 31.5–36.0)
MCV: 88.9 fL (ref 79.5–101.0)
MONO#: 0.5 10*3/uL (ref 0.1–0.9)
MONO%: 7.2 % (ref 0.0–14.0)
NEUT%: 50.9 % (ref 38.4–76.8)
NEUTROS ABS: 3.4 10*3/uL (ref 1.5–6.5)
PLATELETS: 263 10*3/uL (ref 145–400)
RBC: 4.4 10*6/uL (ref 3.70–5.45)
RDW: 14.3 % (ref 11.2–14.5)
WBC: 6.7 10*3/uL (ref 3.9–10.3)
lymph#: 2.7 10*3/uL (ref 0.9–3.3)

## 2014-10-13 LAB — COMPREHENSIVE METABOLIC PANEL (CC13)
ALT: 18 U/L (ref 0–55)
AST: 13 U/L (ref 5–34)
Albumin: 3.8 g/dL (ref 3.5–5.0)
Alkaline Phosphatase: 107 U/L (ref 40–150)
Anion Gap: 10 mEq/L (ref 3–11)
BILIRUBIN TOTAL: 0.56 mg/dL (ref 0.20–1.20)
BUN: 13.2 mg/dL (ref 7.0–26.0)
CO2: 26 meq/L (ref 22–29)
Calcium: 9.7 mg/dL (ref 8.4–10.4)
Chloride: 107 mEq/L (ref 98–109)
Creatinine: 0.8 mg/dL (ref 0.6–1.1)
Glucose: 147 mg/dl — ABNORMAL HIGH (ref 70–140)
Potassium: 3.2 mEq/L — ABNORMAL LOW (ref 3.5–5.1)
Sodium: 143 mEq/L (ref 136–145)
Total Protein: 7.2 g/dL (ref 6.4–8.3)

## 2014-10-13 MED ORDER — INFLUENZA VAC SPLIT QUAD 0.5 ML IM SUSY
0.5000 mL | PREFILLED_SYRINGE | Freq: Once | INTRAMUSCULAR | Status: AC
  Administered 2014-10-13: 0.5 mL via INTRAMUSCULAR
  Filled 2014-10-13: qty 0.5

## 2014-10-13 NOTE — Patient Instructions (Addendum)
You are doing well.  You have no sign of recurrence. You can stop taking Arimidex.  I recommend healthy diet, exercise, and monthly breast exams.   Breast Self-Awareness Practicing breast self-awareness may pick up problems early, prevent significant medical complications, and possibly save your life. By practicing breast self-awareness, you can become familiar with how your breasts look and feel and if your breasts are changing. This allows you to notice changes early. It can also offer you some reassurance that your breast health is good. One way to learn what is normal for your breasts and whether your breasts are changing is to do a breast self-exam. If you find a lump or something that was not present in the past, it is best to contact your caregiver right away. Other findings that should be evaluated by your caregiver include nipple discharge, especially if it is bloody; skin changes or reddening; areas where the skin seems to be pulled in (retracted); or new lumps and bumps. Breast pain is seldom associated with cancer (malignancy), but should also be evaluated by a caregiver. HOW TO PERFORM A BREAST SELF-EXAM The best time to examine your breasts is 5-7 days after your menstrual period is over. During menstruation, the breasts are lumpier, and it may be more difficult to pick up changes. If you do not menstruate, have reached menopause, or had your uterus removed (hysterectomy), you should examine your breasts at regular intervals, such as monthly. If you are breastfeeding, examine your breasts after a feeding or after using a breast pump. Breast implants do not decrease the risk for lumps or tumors, so continue to perform breast self-exams as recommended. Talk to your caregiver about how to determine the difference between the implant and breast tissue. Also, talk about the amount of pressure you should use during the exam. Over time, you will become more familiar with the variations of your breasts  and more comfortable with the exam. A breast self-exam requires you to remove all your clothes above the waist. 1. Look at your breasts and nipples. Stand in front of a mirror in a room with good lighting. With your hands on your hips, push your hands firmly downward. Look for a difference in shape, contour, and size from one breast to the other (asymmetry). Asymmetry includes puckers, dips, or bumps. Also, look for skin changes, such as reddened or scaly areas on the breasts. Look for nipple changes, such as discharge, dimpling, repositioning, or redness. 2. Carefully feel your breasts. This is best done either in the shower or tub while using soapy water or when flat on your back. Place the arm (on the side of the breast you are examining) above your head. Use the pads (not the fingertips) of your three middle fingers on your opposite hand to feel your breasts. Start in the underarm area and use  inch (2 cm) overlapping circles to feel your breast. Use 3 different levels of pressure (light, medium, and firm pressure) at each circle before moving to the next circle. The light pressure is needed to feel the tissue closest to the skin. The medium pressure will help to feel breast tissue a little deeper, while the firm pressure is needed to feel the tissue close to the ribs. Continue the overlapping circles, moving downward over the breast until you feel your ribs below your breast. Then, move one finger-width towards the center of the body. Continue to use the  inch (2 cm) overlapping circles to feel your breast as you  move slowly up toward the collar bone (clavicle) near the base of the neck. Continue the up and down exam using all 3 pressures until you reach the middle of the chest. Do this with each breast, carefully feeling for lumps or changes. 3.  Keep a written record with breast changes or normal findings for each breast. By writing this information down, you do not need to depend only on memory for  size, tenderness, or location. Write down where you are in your menstrual cycle, if you are still menstruating. Breast tissue can have some lumps or thick tissue. However, see your caregiver if you find anything that concerns you.  SEEK MEDICAL CARE IF:  You see a change in shape, contour, or size of your breasts or nipples.   You see skin changes, such as reddened or scaly areas on the breasts or nipples.   You have an unusual discharge from your nipples.   You feel a new lump or unusually thick areas.  Document Released: 12/12/2005 Document Revised: 11/28/2012 Document Reviewed: 03/28/2012 [facility] Patient Information 2015 [place], [place]. This information is not intended to replace advice given to you by your health care provider. Make sure you discuss any questions you have with your health care provider.

## 2014-10-13 NOTE — Progress Notes (Signed)
Hematology and Oncology Follow Up Visit  Morgan Taylor [id number] 12-08-1961 53 y.o. 10/13/2014 2:26 PM     Principal Diagnosis:Morgan Taylor 53 y.o. 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     Prior Therapy:  #1 2009 patient found a breast mass she went on to have a lumpectomy and radiation therapy. She after radiation she was started on Arimidex 1 mg daily. She was diagnosed originally in [place]. Original tumor size was 6 x 8 mm nuclear grade 2 ER positive.   Current therapy: Arimidex 82m daily.  Interim History: Morgan WARRICK89y.o. female with h/o stage I invasive ductal carcinoma who is here for follow up.  She is doing well today. She did have a TKA this past summer and tolerated it well.  She is taking arimidex daily and tolerating it well.  She has occasional hot flashes, which are stable and infrequent.  She denies joint aches, vaginal dryness, new pain, bowel/bladder changes.  She does want to know if she got BRCA tested when she was in [place]  Otherwise she is well and w/o questions or concerns.  We updated her health maintenance below.    Medications:  Current Outpatient Prescriptions  Medication Sig Dispense Refill  . amLODipine (NORVASC) 10 MG tablet Take 10 mg by mouth every morning.       .Marland Kitchenanastrozole (ARIMIDEX) 1 MG tablet Take 1 mg by mouth daily.      .Marland Kitchenatorvastatin (LIPITOR) 20 MG tablet Take 20 mg by mouth every morning.      . clonazePAM (KLONOPIN) 1 MG tablet Take 1 mg by mouth at bedtime.      .Marland KitchenFLUoxetine HCl 60 MG TABS Take 60 mg by mouth daily.      .Marland KitchenHYDROcodone-acetaminophen (NORCO) 10-325 MG per tablet Take 1 tablet by mouth every 6 (six) hours as needed.      . insulin glargine (LANTUS) 100 UNIT/ML injection Inject 24 Units into the skin at bedtime.       .Marland Kitchenlevothyroxine (SYNTHROID, LEVOTHROID) 75 MCG tablet Take 75 mcg by mouth daily before breakfast.      . Liraglutide  (VICTOZA) 18 MG/3ML SOPN Inject 1.8 mg into the skin daily.      .Marland Kitchenlosartan (COZAAR) 100 MG tablet Take 100 mg by mouth every morning.      . metoprolol succinate (TOPROL-XL) 50 MG 24 hr tablet Take 50 mg by mouth every morning. Take with or immediately following a meal.      . omeprazole (PRILOSEC) 20 MG capsule Take 20 mg by mouth every morning.       . pioglitazone (ACTOS) 15 MG tablet Take 15 mg by mouth every morning.       No current facility-administered medications for this visit.     Allergies:  Allergies  Allergen Reactions  . Sulfonamide Derivatives Hives    Medical History: Past Medical History  Diagnosis Date  . Diabetes mellitus   . Hypertension   . Arthritis   . Family history of anesthesia complication     Patients son has difficulty waking up after anesthesia  . Hypothyroidism   . Anxiety   . Depression   . Cancer 2009    lumpectomy  . Hyperlipemia   . GERD (gastroesophageal reflux disease)     Surgical History:  Past Surgical History  Procedure Laterality Date  . Appendectomy    . Shoulder arthroscopy Left  cleaned out joint  . Cesarean section    . Dilation and curettage of uterus    . Breast lumpectomy Left   . Colonoscopy    . Total knee arthroplasty Right 08/15/2014    Procedure: RIGHT TOTAL KNEE ARTHROPLASTY;  Surgeon: Yvette Rack., MD;  Location: [facility];  Service: Orthopedics;  Laterality: Right;     Review of Systems: A 10 point review of systems was conducted and is otherwise negative except for what is noted above.    Health Maintenance  Mammogram: 10/08/14 Colonoscopy: 06/04/14 10 year f/u recommended Bone Density Scan: due Pap Smear: due Eye Exam: hasn't had in quite some time Vitamin D Level:  pending Lipid Panel: she thinks its okay, 07/2014   Physical Exam: Blood pressure 158/88, pulse 74, temperature 98.7 F (37.1 C), temperature source Oral, resp. rate 18, height _0  (1.575 m), weight 167 lb 4.8 oz (75.887  kg). GENERAL: Patient is a well appearing female in no acute distress HEENT:  Sclerae anicteric.  Oropharynx clear and moist. No ulcerations or evidence of oropharyngeal candidiasis. Neck is supple.  NODES:  No cervical, supraclavicular, or axillary lymphadenopathy palpated.  BREAST EXAM: bilateral breasts without nodules masses, s/p left lumpectomy, no nodularity, benign bilateral breast exam LUNGS:  Clear to auscultation bilaterally.  No wheezes or rhonchi. HEART:  Regular rate and rhythm. No murmur appreciated. ABDOMEN:  Soft, nontender.  Positive, normoactive bowel sounds. No organomegaly palpated. MSK:  No focal spinal tenderness to palpation. Full range of motion bilaterally in the upper extremities. EXTREMITIES:  No peripheral edema.   SKIN:  Clear with no obvious rashes or skin changes. No nail dyscrasia. NEURO:  Nonfocal. Well oriented.  Appropriate affect. ECOG PERFORMANCE STATUS: 1 - Symptomatic but completely ambulatory   Lab Results: Lab Results  Component Value Date   WBC 6.7 10/13/2014   HGB 12.6 10/13/2014   HCT 39.1 10/13/2014   MCV 88.9 10/13/2014   PLT 263 10/13/2014     Chemistry      Component Value Date/Time   NA 136* 08/16/2014 0615   K 3.8 08/16/2014 0615   CL 96 08/16/2014 0615   CO2 27 08/16/2014 0615   BUN 11 08/16/2014 0615   CREATININE 0.92 08/16/2014 0615      Component Value Date/Time   CALCIUM 8.5 08/16/2014 0615   ALKPHOS 85 08/07/2014 1247   AST 12 08/07/2014 1247   ALT 15 08/07/2014 1247   BILITOT 0.3 08/07/2014 1247       Radiological Studies: CLINICAL DATA: Left shoulder and bilateral knee pain. History of  breast cancer.  EXAM:  NUCLEAR MEDICINE WHOLE BODY BONE SCAN  TECHNIQUE:  Whole body anterior and posterior images were obtained approximately  3 hours after intravenous injection of radiopharmaceutical.  RADIOPHARMACEUTICALS: 25.2 mCi of Technetium-99 MDP  COMPARISON: None  FINDINGS:  There is increased activity at the left  acromioclavicular joint and  left glenohumeral joint. There is increased activity at both knees  particularly in the medial compartments, bilaterally and  symmetrically. There is also increased activity in the talonavicular  joint of the left foot.  There is focal increased activity at L3-4 to the right of midline  which probably represents degenerative disc disease.  The findings are not suggestive of metastatic disease.  IMPRESSION:  Findings consistent with arthritis of the left shoulder and both  knees and in the left mid foot. Probable degenerative disc disease  at L3-4.  Electronically Signed  By: Vanita Ingles.D.  On: 04/29/2014 14:38   Assessment and Plan: Morgan Taylor 53 y.o. female with  ER positive stage I invasive ductal carcinoma of the left breast diagnosed in 2009.  The patient is taking Arimidex daily and tolerating it well.  She was started on this in 2010 at some point towards the beginning of the year.  She can stop it as she has now completed 5 years of therapy.  Morgan Taylor has no sign of recurrence.  She is very concerned about needing genetic testing and would like to be referred to a Dietitian.  I did this today.  I also ordered a bone density to be done.  I referred her to gynecology as well as she is due for pap smears.  I recommended healthy diet, exercise, and monthly breast exams.    Morgan Taylor will return in 1 year for labs and f/u.  She knows to call us in the interim for any questions or concerns.  We can certainly see her sooner if needed.  I spent 25 minutes counseling the patient face to face.  The total time spent in the appointment was 30 minutes.  Minette Headland, [place] [phone] 10/13/2014 2:26 PM

## 2014-10-13 NOTE — Telephone Encounter (Signed)
per pof to sch pt appt-sch pt DEXA-pt stated will call another OB-GYN on her own-gave pt copy of sch

## 2014-10-14 LAB — CANCER ANTIGEN 27.29: CA 27.29: 22 U/mL (ref 0–39)

## 2014-10-15 ENCOUNTER — Telehealth: Payer: Self-pay | Admitting: *Deleted

## 2014-10-15 ENCOUNTER — Ambulatory Visit: Payer: Medicare Other | Admitting: Physical Therapy

## 2014-10-15 DIAGNOSIS — M25561 Pain in right knee: Secondary | ICD-10-CM | POA: Diagnosis not present

## 2014-10-15 NOTE — Telephone Encounter (Signed)
Called pt to inform her lab results. Communicated with pt that Potassium level was a bit low(3.2L). Encouraged pt to increase her intake of high potassium foods and gave her some examples. Also communicated with pt that her tumor marker was normal. Pt verbalized understanding and agreed to plan. Message to be forwarded to [place].

## 2014-10-17 ENCOUNTER — Ambulatory Visit: Payer: Medicare Other | Admitting: Physical Therapy

## 2014-10-17 DIAGNOSIS — M25561 Pain in right knee: Secondary | ICD-10-CM | POA: Diagnosis not present

## 2014-10-20 ENCOUNTER — Ambulatory Visit: Payer: Medicare Other | Admitting: Physical Therapy

## 2014-10-20 DIAGNOSIS — M25561 Pain in right knee: Secondary | ICD-10-CM | POA: Diagnosis not present

## 2014-10-22 ENCOUNTER — Ambulatory Visit: Payer: Medicare Other | Admitting: Physical Therapy

## 2014-10-22 DIAGNOSIS — M25561 Pain in right knee: Secondary | ICD-10-CM | POA: Diagnosis not present

## 2014-10-24 ENCOUNTER — Ambulatory Visit: Payer: Medicare Other | Admitting: Physical Therapy

## 2014-10-24 DIAGNOSIS — M25561 Pain in right knee: Secondary | ICD-10-CM | POA: Diagnosis not present

## 2014-10-27 ENCOUNTER — Ambulatory Visit: Payer: Medicare Other | Attending: Orthopedic Surgery | Admitting: Physical Therapy

## 2014-10-27 ENCOUNTER — Ambulatory Visit
Admission: RE | Admit: 2014-10-27 | Discharge: 2014-10-27 | Disposition: A | Discharge status: Home / Self Care | Payer: Medicare Other | Source: Ambulatory Visit | Attending: Adult Health | Admitting: Adult Health

## 2014-10-27 DIAGNOSIS — R262 Difficulty in walking, not elsewhere classified: Secondary | ICD-10-CM | POA: Insufficient documentation

## 2014-10-27 DIAGNOSIS — M25561 Pain in right knee: Secondary | ICD-10-CM | POA: Diagnosis not present

## 2014-10-27 DIAGNOSIS — I10 Essential (primary) hypertension: Secondary | ICD-10-CM | POA: Insufficient documentation

## 2014-10-27 DIAGNOSIS — R609 Edema, unspecified: Secondary | ICD-10-CM | POA: Insufficient documentation

## 2014-10-27 DIAGNOSIS — M25661 Stiffness of right knee, not elsewhere classified: Secondary | ICD-10-CM | POA: Insufficient documentation

## 2014-10-27 DIAGNOSIS — Z96651 Presence of right artificial knee joint: Secondary | ICD-10-CM | POA: Insufficient documentation

## 2014-10-27 DIAGNOSIS — E119 Type 2 diabetes mellitus without complications: Secondary | ICD-10-CM | POA: Diagnosis not present

## 2014-10-27 DIAGNOSIS — M1990 Unspecified osteoarthritis, unspecified site: Secondary | ICD-10-CM | POA: Diagnosis not present

## 2014-10-27 DIAGNOSIS — E2839 Other primary ovarian failure: Secondary | ICD-10-CM

## 2014-10-29 ENCOUNTER — Ambulatory Visit: Payer: Medicare Other | Admitting: Physical Therapy

## 2014-10-29 ENCOUNTER — Telehealth: Payer: Self-pay | Admitting: *Deleted

## 2014-10-29 DIAGNOSIS — M25561 Pain in right knee: Secondary | ICD-10-CM | POA: Diagnosis not present

## 2014-10-29 NOTE — Telephone Encounter (Signed)
Called to inform pt of bone density results. No answer, but left a detailed message to VM concerning results which were normal. Pt knows if she has questions she can call this nurse back at [phone]. Message to be forwarded to Charlestine Massed, NP.

## 2014-11-03 ENCOUNTER — Other Ambulatory Visit: Payer: Medicare Other

## 2014-11-03 ENCOUNTER — Ambulatory Visit (HOSPITAL_BASED_OUTPATIENT_CLINIC_OR_DEPARTMENT_OTHER): Payer: Medicare Other | Admitting: Genetic Counselor

## 2014-11-03 ENCOUNTER — Encounter: Payer: Self-pay | Admitting: Genetic Counselor

## 2014-11-03 ENCOUNTER — Ambulatory Visit: Payer: Medicare Other | Admitting: Physical Therapy

## 2014-11-03 DIAGNOSIS — Z315 Encounter for genetic counseling: Secondary | ICD-10-CM

## 2014-11-03 DIAGNOSIS — C50519 Malignant neoplasm of lower-outer quadrant of unspecified female breast: Secondary | ICD-10-CM

## 2014-11-03 DIAGNOSIS — M25561 Pain in right knee: Secondary | ICD-10-CM | POA: Diagnosis not present

## 2014-11-03 DIAGNOSIS — Z800 Family history of malignant neoplasm of digestive organs: Secondary | ICD-10-CM

## 2014-11-03 DIAGNOSIS — C50912 Malignant neoplasm of unspecified site of left female breast: Secondary | ICD-10-CM

## 2014-11-03 NOTE — Progress Notes (Signed)
Morgan Taylor requested a consultation for genetic counseling and risk assessment for Morgan Taylor, a 53 y.o. female, for discussion of her personal history of breast cancer and family history of pancreatic cancer.  She presents to clinic today to discuss the possibility of a genetic predisposition to cancer, and to further clarify her risks, as well as her family members' risks for cancer.   HISTORY OF PRESENT ILLNESS: In 2009, at the age of 53, Morgan Taylor was diagnosed with invasive ductal carcinoma of the breast. This was treated with lumpectomy and radiation.  She has had a colonoscopy and not had polyps.     Past Medical History  Diagnosis Date  . Diabetes mellitus   . Hypertension   . Arthritis   . Family history of anesthesia complication     Patients son has difficulty waking up after anesthesia  . Hypothyroidism   . Anxiety   . Depression   . Cancer 2009    lumpectomy  . Hyperlipemia   . GERD (gastroesophageal reflux disease)     Past Surgical History  Procedure Laterality Date  . Appendectomy    . Shoulder arthroscopy Left     cleaned out joint  . Cesarean section    . Dilation and curettage of uterus    . Breast lumpectomy Left   . Colonoscopy    . Total knee arthroplasty Right 08/15/2014    Procedure: RIGHT TOTAL KNEE ARTHROPLASTY;  Surgeon: Yvette Rack., MD;  Location: [facility];  Service: Orthopedics;  Laterality: Right;    History   Social History  . Marital Status: Divorced    Spouse Name: N/A    Number of Children: 87  . Years of Education: N/A   Social History Main Topics  . Smoking status: Never Smoker   . Smokeless tobacco: Never Used  . Alcohol Use: No  . Drug Use: No  . Sexual Activity: Not Currently   Other Topics Concern  . None   Social History Narrative    REPRODUCTIVE HISTORY AND PERSONAL RISK ASSESSMENT FACTORS: Menarche was at age 33.   postmenopausal Uterus Intact: yes Ovaries Intact: yes [id number], first live birth at age  65  She has not previously undergone treatment for infertility.   Oral Contraceptive use: 10 years   She has not used HRT in the past.    FAMILY HISTORY:  We obtained a detailed, 4-generation family history.  Significant diagnoses are listed below: Family History  Problem Relation Age of Onset  . Colon cancer Neg Hx   . Rectal cancer Neg Hx   . Stomach cancer Neg Hx   . Cancer Paternal Grandmother     NOS  . Pancreatic cancer Paternal Aunt   . Cancer Paternal Aunt     NOS    Patient's maternal ancestors are of Caucasian descent, and paternal ancestors are of Caucasian descent. There is no reported Ashkenazi Jewish ancestry. There is no known consanguinity.  GENETIC COUNSELING ASSESSMENT: Morgan Taylor is a 53 y.o. female with a personal history of breast cancer and family history of pancreatic cancer and cancer - NOS which somewhat suggestive of a sporadic cancer. We, therefore, discussed and recommended the following at today's visit.   DISCUSSION: We reviewed the characteristics, features and inheritance patterns of hereditary cancer syndromes. We also discussed genetic testing, including the appropriate family members to test, the process of testing, insurance coverage and turn-around-time for results. We reviewed that her cancer was diagnosed at age  43, and that she has one family member with pancreatic cancer and two with unknown cancers.  Her father comes from a large family that had a total of 10 children.  It is common to see cancer in families with many family members, however, if this was a hereditary cancer syndrome we typically would expect to see a few more affected individuals, especially in her cousins.  Morgan Taylor was unaware of the type of cancer in one aunt and her grandmother.  We discussed that if she did a little research and found out the type of cancer these individuals had, that it could change our assessment.  I gave her my card and asked that she call me once she has more  information.  PLAN: After considering the risks, benefits, and limitations, Morgan Taylor declined testing as she did not meet Medicare criteria. We encouraged Morgan Taylor to remain in contact with cancer genetics annually so that we can continuously update the family history and inform her of any changes in cancer genetics and testing that may be of benefit for her family. Morgan Taylor's questions were answered to her satisfaction today. Our contact information was provided should additional questions or concerns arise.  The patient was seen for a total of 30 minutes, greater than 50% of which was spent face-to-face counseling.  This note will also be sent to the referring provider via the electronic medical record. The patient will be supplied with a summary of this genetic counseling discussion as well as educational information on the discussed hereditary cancer syndromes following the conclusion of their visit.    _______________________________________________________________________ For Office Staff:  Number of people involved in session: 1 Was an Intern/ student involved with case: no

## 2014-11-05 ENCOUNTER — Ambulatory Visit: Payer: Medicare Other | Admitting: Physical Therapy

## 2014-11-07 ENCOUNTER — Ambulatory Visit: Payer: Medicare Other | Admitting: Physical Therapy

## 2014-11-11 ENCOUNTER — Telehealth: Payer: Self-pay

## 2014-11-11 NOTE — Telephone Encounter (Signed)
Bone density results dtd 10/27/14 rcvd from [facility].  Reviewed by Dr. Lindi Adie.  Sent to scan.

## 2014-12-08 ENCOUNTER — Telehealth: Payer: Self-pay | Admitting: Hematology and Oncology

## 2014-12-08 NOTE — Telephone Encounter (Signed)
, °

## 2014-12-16 ENCOUNTER — Encounter: Payer: Self-pay | Admitting: Hematology and Oncology

## 2015-01-07 ENCOUNTER — Ambulatory Visit: Payer: Self-pay | Admitting: Physician Assistant

## 2015-01-07 NOTE — H&P (Signed)
TOTAL KNEE ADMISSION H&P  Patient is being admitted for left total knee arthroplasty.  Subjective:  Chief Complaint:left knee pain.  HPI: Morgan Taylor, 54 y.o. female, has a history of pain and functional disability in the left knee due to arthritis and has failed non-surgical conservative treatments for greater than 12 weeks to includeNSAID's and/or analgesics, corticosteriod injections, viscosupplementation injections, use of assistive devices and activity modification.  Onset of symptoms was gradual, starting 10 years ago with gradually worsening course since that time. The patient noted no past surgery on the left knee(s).  Patient currently rates pain in the left knee(s) at 10 out of 10 with activity. Patient has night pain, worsening of pain with activity and weight bearing, pain that interferes with activities of daily living, pain with passive range of motion, crepitus and joint swelling.  Patient has evidence of periarticular osteophytes and joint space narrowing by imaging studies.  There is no active infection.  Patient Active Problem List   Diagnosis Date Noted  . Osteoarthritis of right knee 08/15/2014  . Cancer of lower-outer quadrant of female breast 01/23/2012  . SNORING 05/03/2007  . HYPERGLYCEMIA 05/03/2007  . PHARYNGITIS, ACUTE 02/22/2007  . HYPERPLASIA, ENDOMETRIAL NOS 02/22/2007  . DYSMENORRHEA 10/18/2006  . DEGENERATIVE JOINT DISEASE 10/18/2006  . INSOMNIA 10/18/2006  . CHEST PAIN 10/18/2006  . OBESITY 10/05/2006  . ANXIETY 10/05/2006  . HYPERTENSION 10/05/2006  . GASTROENTERITIS 10/05/2006  . VAGINAL BLEEDING 10/05/2006  . MENORRHAGIA, PERIMENOPAUSAL 10/05/2006  . 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 Diabetes mellitus   . Hypertension   . Arthritis   . Family history of anesthesia complication     Patients son has difficulty waking up after anesthesia  . Hypothyroidism   . Anxiety   . Depression   . Cancer 2009    lumpectomy  . Hyperlipemia   . GERD (gastroesophageal reflux disease)     Past Surgical History  Procedure Laterality Date  . Appendectomy    . Shoulder arthroscopy Left     cleaned out joint  . Cesarean section    . Dilation and curettage of uterus    . Breast lumpectomy Left   . Colonoscopy    . Total knee arthroplasty Right 08/15/2014    Procedure: RIGHT TOTAL KNEE ARTHROPLASTY;  Surgeon: Yvette Rack., MD;  Location: [facility];  Service: Orthopedics;  Laterality: Right;     (Not in a hospital admission) Allergies  Allergen Reactions  . Sulfonamide Derivatives Hives    History  Substance Use Topics  . Smoking status: Never Smoker   . Smokeless tobacco: Never Used  . Alcohol Use: No    Family History  Problem Relation Age of Onset  . Colon cancer Neg Hx   . Rectal cancer Neg Hx   . Stomach cancer Neg Hx   . Cancer Paternal Grandmother     NOS  . Pancreatic cancer Paternal Aunt   . Cancer Paternal Aunt     NOS     Review of Systems  Constitutional: Negative.   HENT: Negative.   Eyes: Negative.   Respiratory: Negative.   Cardiovascular: Negative.   Gastrointestinal: Negative.   Genitourinary: Negative.   Musculoskeletal: Positive for joint pain. Negative for falls.  Skin: Negative.   Neurological: Negative.   Endo/Heme/Allergies: Negative.   Psychiatric/Behavioral: Negative.     Objective:  Physical Exam  Constitutional: She is oriented to person, place, and time. She appears well-developed and well-nourished.  No distress.  HENT:  Head: Normocephalic and atraumatic.  Nose: Nose normal.  Eyes: Conjunctivae and EOM are normal. Pupils are equal, round, and reactive to light.  Neck: Normal range of motion. Neck supple.  Cardiovascular: Normal rate, regular rhythm, normal heart sounds and intact distal pulses.   Respiratory: Effort normal and breath sounds normal. No respiratory distress. She has no wheezes.  GI: Soft. Bowel sounds are normal. She exhibits  no distension. There is no tenderness.  Musculoskeletal:       Left knee: She exhibits decreased range of motion, swelling and bony tenderness. She exhibits no effusion, no laceration, no erythema, no LCL laxity and no MCL laxity. Tenderness found. Medial joint line and lateral joint line tenderness noted.  Lymphadenopathy:    She has no cervical adenopathy.  Neurological: She is alert and oriented to person, place, and time. No cranial nerve deficit.  Skin: Skin is warm and dry. No rash noted. No erythema. No pallor.  Psychiatric: She has a normal mood and affect. Her behavior is normal.    Vital signs in last 24 hours: @VSRANGES @  Labs:   Estimated body mass index is 30.59 kg/(m^2) as calculated from the following:   Height as of 10/13/14: 5\' 2"  (1.575 m).   Weight as of 10/13/14: 75.887 kg (167 lb 4.8 oz).   Imaging Review Plain radiographs demonstrate severe degenerative joint disease of the left knee(s). The overall alignment ismild varus. The bone quality appears to be good for age and reported activity level.  Assessment/Plan:  End stage arthritis, left knee   The patient history, physical examination, clinical judgment of the provider and imaging studies are consistent with end stage degenerative joint disease of the left knee(s) and total knee arthroplasty is deemed medically necessary. The treatment options including medical management, injection therapy arthroscopy and arthroplasty were discussed at length. The risks and benefits of total knee arthroplasty were presented and reviewed. The risks due to aseptic loosening, infection, stiffness, patella tracking problems, thromboembolic complications and other imponderables were discussed. The patient acknowledged the explanation, agreed to proceed with the plan and consent was signed. Patient is being admitted for inpatient treatment for surgery, pain control, PT, OT, prophylactic antibiotics, VTE prophylaxis, progressive  ambulation and ADL's and discharge planning. The patient is planning to be discharged home with home health services

## 2015-01-09 ENCOUNTER — Encounter (HOSPITAL_COMMUNITY): Payer: Self-pay

## 2015-01-09 ENCOUNTER — Encounter (HOSPITAL_COMMUNITY)
Admission: RE | Admit: 2015-01-09 | Discharge: 2015-01-09 | Disposition: A | Discharge status: Home / Self Care | Payer: Medicare Other | Source: Ambulatory Visit | Attending: Orthopedic Surgery | Admitting: Orthopedic Surgery

## 2015-01-09 DIAGNOSIS — I10 Essential (primary) hypertension: Secondary | ICD-10-CM | POA: Diagnosis not present

## 2015-01-09 DIAGNOSIS — E785 Hyperlipidemia, unspecified: Secondary | ICD-10-CM | POA: Insufficient documentation

## 2015-01-09 DIAGNOSIS — E039 Hypothyroidism, unspecified: Secondary | ICD-10-CM | POA: Diagnosis not present

## 2015-01-09 DIAGNOSIS — K219 Gastro-esophageal reflux disease without esophagitis: Secondary | ICD-10-CM | POA: Diagnosis not present

## 2015-01-09 DIAGNOSIS — E119 Type 2 diabetes mellitus without complications: Secondary | ICD-10-CM | POA: Diagnosis not present

## 2015-01-09 DIAGNOSIS — M1712 Unilateral primary osteoarthritis, left knee: Secondary | ICD-10-CM | POA: Insufficient documentation

## 2015-01-09 DIAGNOSIS — Z01812 Encounter for preprocedural laboratory examination: Secondary | ICD-10-CM | POA: Insufficient documentation

## 2015-01-09 DIAGNOSIS — Z01818 Encounter for other preprocedural examination: Secondary | ICD-10-CM | POA: Insufficient documentation

## 2015-01-09 LAB — COMPREHENSIVE METABOLIC PANEL
ALBUMIN: 3.5 g/dL (ref 3.5–5.2)
ALK PHOS: 88 U/L (ref 39–117)
ALT: 30 U/L (ref 0–35)
AST: 21 U/L (ref 0–37)
Anion gap: 7 (ref 5–15)
BILIRUBIN TOTAL: 0.6 mg/dL (ref 0.3–1.2)
BUN: 7 mg/dL (ref 6–23)
CO2: 27 mmol/L (ref 19–32)
CREATININE: 0.7 mg/dL (ref 0.50–1.10)
Calcium: 8.8 mg/dL (ref 8.4–10.5)
Chloride: 105 mEq/L (ref 96–112)
GFR calc Af Amer: >90 mL/min (ref >90)
GFR calc non Af Amer: >90 mL/min (ref >90)
GLUCOSE: 129 mg/dL — AB (ref 70–99)
Potassium: 3.3 mmol/L — ABNORMAL LOW (ref 3.5–5.1)
Sodium: 139 mmol/L (ref 135–145)
Total Protein: 6.5 g/dL (ref 6.0–8.3)

## 2015-01-09 LAB — PROTIME-INR
INR: 1.07 (ref 0.00–1.49)
PROTHROMBIN TIME: 14 s (ref 11.6–15.2)

## 2015-01-09 LAB — CBC WITH DIFFERENTIAL/PLATELET
BASOS ABS: 0 10*3/uL (ref 0.0–0.1)
BASOS PCT: 0 % (ref 0–1)
EOS PCT: 2 % (ref 0–5)
Eosinophils Absolute: 0.2 10*3/uL (ref 0.0–0.7)
HCT: 38.9 % (ref 36.0–46.0)
Hemoglobin: 12.5 g/dL (ref 12.0–15.0)
LYMPHS ABS: 2.5 10*3/uL (ref 0.7–4.0)
Lymphocytes Relative: 30 % (ref 12–46)
MCH: 29.3 pg (ref 26.0–34.0)
MCHC: 32.1 g/dL (ref 30.0–36.0)
MCV: 91.3 fL (ref 78.0–100.0)
Monocytes Absolute: 0.6 10*3/uL (ref 0.1–1.0)
Monocytes Relative: 8 % (ref 3–12)
NEUTROS ABS: 5 10*3/uL (ref 1.7–7.7)
Neutrophils Relative %: 60 % (ref 43–77)
PLATELETS: 239 10*3/uL (ref 150–400)
RBC: 4.26 MIL/uL (ref 3.87–5.11)
RDW: 14.2 % (ref 11.5–15.5)
WBC: 8.3 10*3/uL (ref 4.0–10.5)

## 2015-01-09 LAB — TYPE AND SCREEN
ABO/RH(D): A POS
ANTIBODY SCREEN: NEGATIVE

## 2015-01-09 LAB — APTT: aPTT: 27 seconds (ref 24–37)

## 2015-01-09 LAB — URINALYSIS, ROUTINE W REFLEX MICROSCOPIC
Bilirubin Urine: NEGATIVE
Glucose, UA: NEGATIVE mg/dL
Hgb urine dipstick: NEGATIVE
Ketones, ur: NEGATIVE mg/dL
Leukocytes, UA: NEGATIVE
NITRITE: NEGATIVE
PROTEIN: NEGATIVE mg/dL
Specific Gravity, Urine: 1.013 (ref 1.005–1.030)
Urobilinogen, UA: 1 mg/dL (ref 0.0–1.0)
pH: 6 (ref 5.0–8.0)

## 2015-01-09 LAB — SURGICAL PCR SCREEN
MRSA, PCR: POSITIVE — AB
Staphylococcus aureus: POSITIVE — AB

## 2015-01-09 LAB — HEMOGLOBIN A1C
Hgb A1c MFr Bld: 7.5 % — ABNORMAL HIGH (ref <5.7)
Mean Plasma Glucose: 169 mg/dL — ABNORMAL HIGH (ref <117)

## 2015-01-09 NOTE — Pre-Procedure Instructions (Signed)
KANDAS OLIVETO  01/09/2015   Your procedure is scheduled on:  Friday, January 23, 2015  Report to [facility] Admitting at 5:30 AM.  Call this number if you have problems the morning of surgery: [phone]   Remember:   Do not eat food or drink liquids after midnight Thursday, January 22, 2015   Take these medicines the morning of surgery with A SIP OF WATER: amLODipine (NORVASC), FLUoxetine , WELLBUTRIN,   metoprolol succinate (TOPROL-XL), omeprazole (PRILOSEC), if needed: OXYCODONE for pain  DO NOT TAKE ANY DIABETIC MEDICATION THE MORNING OF PROCEDURE  Stop taking Aspirin,vitamins, and herbal medications. Do not take any NSAIDs ie: Ibuprofen, Advil, TOPAMAX,  Naproxen or any medication containing Aspirin; stop 1 week prior to procedure ( Friday, January 16, 2015)   Do not wear jewelry, make-up or nail polish.  Do not wear lotions, powders, or perfumes. You may not wear deodorant.  Do not shave 48 hours prior to surgery.   Do not bring valuables to the hospital.  [facility] is not responsible for any belongings or valuables.               Contacts, dentures or bridgework may not be worn into surgery.  Leave suitcase in the car. After surgery it may be brought to your room.  For patients admitted to the hospital, discharge time is determined by your treatment team.               Patients discharged the day of surgery will not be allowed to drive home.  Name and phone number of your driver:   Special Instructions:  Special Instructions:Special Instructions: [facility] - Preparing for Surgery  Before surgery, you can play an important role.  Because skin is not sterile, your skin needs to be as free of germs as possible.  You can reduce the number of germs on you skin by washing with CHG (chlorahexidine gluconate) soap before surgery.  CHG is an antiseptic cleaner which kills germs and bonds with the skin to continue killing germs even after washing.  Please DO NOT use if  you have an allergy to CHG or antibacterial soaps.  If your skin becomes reddened/irritated stop using the CHG and inform your nurse when you arrive at Short Stay.  Do not shave (including legs and underarms) for at least 48 hours prior to the first CHG shower.  You may shave your face.  Please follow these instructions carefully:   1.  Shower with CHG Soap the night before surgery and the morning of Surgery.  2.  If you choose to wash your hair, wash your hair first as usual with your normal shampoo.  3.  After you shampoo, rinse your hair and body thoroughly to remove the Shampoo.  4.  Use CHG as you would any other liquid soap.  You can apply chg directly  to the skin and wash gently with scrungie or a clean washcloth.  5.  Apply the CHG Soap to your body ONLY FROM THE NECK DOWN.  Do not use on open wounds or open sores.  Avoid contact with your eyes, ears, mouth and genitals (private parts).  Wash genitals (private parts) with your normal soap.  6.  Wash thoroughly, paying special attention to the area where your surgery will be performed.  7.  Thoroughly rinse your body with warm water from the neck down.  8.  DO NOT shower/wash with your normal soap after using and rinsing off  the CHG Soap.  9.  Pat yourself dry with a clean towel.            10.  Wear clean pajamas.            11.  Place clean sheets on your bed the night of your first shower and do not sleep with pets.  Day of Surgery  Do not apply any lotions/deodorants the morning of surgery.  Please wear clean clothes to the hospital/surgery center.   Please read over the following fact sheets that you were given: Pain Booklet, Coughing and Deep Breathing, Blood Transfusion Information, Total Joint Packet, MRSA Information and Surgical Site Infection Prevention

## 2015-01-11 LAB — URINE CULTURE: Colony Count: 8000

## 2015-01-22 MED ORDER — CEFAZOLIN SODIUM-DEXTROSE 2-3 GM-% IV SOLR
2.0000 g | INTRAVENOUS | Status: AC
  Administered 2015-01-23: 2 g via INTRAVENOUS
  Filled 2015-01-22: qty 50

## 2015-01-22 NOTE — Anesthesia Preprocedure Evaluation (Addendum)
Anesthesia Evaluation  Patient identified by MRN, date of birth, ID band Patient awake    Reviewed: Allergy & Precautions, NPO status , Patient's Chart, lab work & pertinent test results, reviewed documented beta blocker date and time   Airway Mallampati: II  TM Distance: >3 FB Neck ROM: Full    Dental  (+) Teeth Intact, Dental Advisory Given   Pulmonary former smoker,  breath sounds clear to auscultation        Cardiovascular hypertension, Pt. on medications and Pt. on home beta blockers Rhythm:Regular     Neuro/Psych Anxiety negative neurological ROS  negative psych ROS   GI/Hepatic Neg liver ROS, GERD-  Medicated and Controlled,  Endo/Other  diabetes, Well Controlled, Type 2, Insulin Dependent  Renal/GU negative Renal ROS     Musculoskeletal   Abdominal (+) + obese,   Peds  Hematology negative hematology ROS (+)   Anesthesia Other Findings   Reproductive/Obstetrics                          Anesthesia Physical Anesthesia Plan  ASA: III  Anesthesia Plan: General   Post-op Pain Management: MAC Combined w/ Regional for Post-op pain   Induction: Intravenous  Airway Management Planned: Oral ETT  Additional Equipment:   Intra-op Plan:   Post-operative Plan: Extubation in OR  Informed Consent: I have reviewed the patients History and Physical, chart, labs and discussed the procedure including the risks, benefits and alternatives for the proposed anesthesia with the patient or authorized representative who has indicated his/her understanding and acceptance.     Plan Discussed with:   Anesthesia Plan Comments:         Anesthesia Quick Evaluation

## 2015-01-23 ENCOUNTER — Inpatient Hospital Stay (HOSPITAL_COMMUNITY): Payer: Medicare Other | Admitting: Anesthesiology

## 2015-01-23 ENCOUNTER — Encounter (HOSPITAL_COMMUNITY): Payer: Self-pay | Admitting: Surgery

## 2015-01-23 ENCOUNTER — Inpatient Hospital Stay (HOSPITAL_COMMUNITY)
Admission: RE | Admit: 2015-01-23 | Discharge: 2015-01-25 | DRG: 470 | Disposition: A | Discharge status: Home Health | Payer: Medicare Other | Source: Ambulatory Visit | Attending: Orthopedic Surgery | Admitting: Orthopedic Surgery

## 2015-01-23 ENCOUNTER — Encounter (HOSPITAL_COMMUNITY)
Admission: RE | Disposition: A | Discharge status: Home Health | Payer: Self-pay | Source: Ambulatory Visit | Attending: Orthopedic Surgery

## 2015-01-23 DIAGNOSIS — Z794 Long term (current) use of insulin: Secondary | ICD-10-CM

## 2015-01-23 DIAGNOSIS — E785 Hyperlipidemia, unspecified: Secondary | ICD-10-CM | POA: Diagnosis present

## 2015-01-23 DIAGNOSIS — M25562 Pain in left knee: Secondary | ICD-10-CM | POA: Diagnosis present

## 2015-01-23 DIAGNOSIS — F329 Major depressive disorder, single episode, unspecified: Secondary | ICD-10-CM | POA: Diagnosis present

## 2015-01-23 DIAGNOSIS — E039 Hypothyroidism, unspecified: Secondary | ICD-10-CM | POA: Diagnosis present

## 2015-01-23 DIAGNOSIS — Z87891 Personal history of nicotine dependence: Secondary | ICD-10-CM | POA: Diagnosis not present

## 2015-01-23 DIAGNOSIS — M1712 Unilateral primary osteoarthritis, left knee: Principal | ICD-10-CM | POA: Diagnosis present

## 2015-01-23 DIAGNOSIS — Z79899 Other long term (current) drug therapy: Secondary | ICD-10-CM

## 2015-01-23 DIAGNOSIS — D62 Acute posthemorrhagic anemia: Secondary | ICD-10-CM | POA: Diagnosis not present

## 2015-01-23 DIAGNOSIS — I10 Essential (primary) hypertension: Secondary | ICD-10-CM | POA: Diagnosis present

## 2015-01-23 DIAGNOSIS — F419 Anxiety disorder, unspecified: Secondary | ICD-10-CM | POA: Diagnosis present

## 2015-01-23 DIAGNOSIS — E119 Type 2 diabetes mellitus without complications: Secondary | ICD-10-CM | POA: Diagnosis present

## 2015-01-23 DIAGNOSIS — Z7901 Long term (current) use of anticoagulants: Secondary | ICD-10-CM

## 2015-01-23 DIAGNOSIS — K219 Gastro-esophageal reflux disease without esophagitis: Secondary | ICD-10-CM | POA: Diagnosis present

## 2015-01-23 HISTORY — PX: TOTAL KNEE ARTHROPLASTY: SHX125

## 2015-01-23 LAB — GLUCOSE, CAPILLARY
GLUCOSE-CAPILLARY: 145 mg/dL — AB (ref 70–99)
GLUCOSE-CAPILLARY: 159 mg/dL — AB (ref 70–99)
Glucose-Capillary: 227 mg/dL — ABNORMAL HIGH (ref 70–99)
Glucose-Capillary: 275 mg/dL — ABNORMAL HIGH (ref 70–99)

## 2015-01-23 SURGERY — ARTHROPLASTY, KNEE, TOTAL
Anesthesia: General | Site: Knee | Laterality: Left

## 2015-01-23 MED ORDER — ARTIFICIAL TEARS OP OINT
TOPICAL_OINTMENT | OPHTHALMIC | Status: AC
  Filled 2015-01-23: qty 3.5

## 2015-01-23 MED ORDER — AMLODIPINE BESYLATE 10 MG PO TABS
10.0000 mg | ORAL_TABLET | Freq: Every morning | ORAL | Status: DC
  Administered 2015-01-24 – 2015-01-25 (×2): 10 mg via ORAL
  Filled 2015-01-23 (×2): qty 1

## 2015-01-23 MED ORDER — FENTANYL CITRATE 0.05 MG/ML IJ SOLN
25.0000 ug | INTRAMUSCULAR | Status: DC | PRN
  Administered 2015-01-23 (×2): 50 ug via INTRAVENOUS

## 2015-01-23 MED ORDER — PROMETHAZINE HCL 25 MG/ML IJ SOLN: 6.2500 mg | INTRAMUSCULAR | Status: DC | PRN

## 2015-01-23 MED ORDER — METOPROLOL SUCCINATE ER 50 MG PO TB24
50.0000 mg | ORAL_TABLET | Freq: Every morning | ORAL | Status: DC
  Administered 2015-01-24 – 2015-01-25 (×2): 50 mg via ORAL
  Filled 2015-01-23 (×2): qty 1

## 2015-01-23 MED ORDER — CHLORHEXIDINE GLUCONATE 4 % EX LIQD
60.0000 mL | Freq: Once | CUTANEOUS | Status: DC
  Filled 2015-01-23: qty 60

## 2015-01-23 MED ORDER — GLYCOPYRROLATE 0.2 MG/ML IJ SOLN
INTRAMUSCULAR | Status: DC | PRN
  Administered 2015-01-23: 0.6 mg via INTRAVENOUS

## 2015-01-23 MED ORDER — METHOCARBAMOL 500 MG PO TABS
ORAL_TABLET | ORAL | Status: AC
  Filled 2015-01-23: qty 1

## 2015-01-23 MED ORDER — DEXAMETHASONE SODIUM PHOSPHATE 4 MG/ML IJ SOLN
INTRAMUSCULAR | Status: DC | PRN
  Administered 2015-01-23: 4 mg via INTRAVENOUS

## 2015-01-23 MED ORDER — PROPOFOL 10 MG/ML IV BOLUS
INTRAVENOUS | Status: AC
  Filled 2015-01-23: qty 20

## 2015-01-23 MED ORDER — DEXTROSE 5 % IV SOLN
INTRAVENOUS | Status: DC | PRN
  Administered 2015-01-23: 08:00:00 via INTRAVENOUS

## 2015-01-23 MED ORDER — BUPIVACAINE-EPINEPHRINE (PF) 0.5% -1:200000 IJ SOLN
INTRAMUSCULAR | Status: AC
  Filled 2015-01-23: qty 30

## 2015-01-23 MED ORDER — DIPHENHYDRAMINE HCL 12.5 MG/5ML PO ELIX
12.5000 mg | ORAL_SOLUTION | ORAL | Status: DC | PRN
  Administered 2015-01-23 – 2015-01-24 (×4): 25 mg via ORAL
  Filled 2015-01-23 (×4): qty 10

## 2015-01-23 MED ORDER — INSULIN ASPART 100 UNIT/ML ~~LOC~~ SOLN
4.0000 [IU] | Freq: Three times a day (TID) | SUBCUTANEOUS | Status: DC
  Administered 2015-01-23 – 2015-01-25 (×6): 4 [IU] via SUBCUTANEOUS

## 2015-01-23 MED ORDER — RIVAROXABAN 10 MG PO TABS
10.0000 mg | ORAL_TABLET | Freq: Every day | ORAL | Status: DC
Start: 2015-01-23 — End: 2016-02-09

## 2015-01-23 MED ORDER — OXYCODONE HCL ER 10 MG PO T12A
10.0000 mg | EXTENDED_RELEASE_TABLET | Freq: Two times a day (BID) | ORAL | Status: DC
  Administered 2015-01-23: 10 mg via ORAL
  Filled 2015-01-23: qty 1

## 2015-01-23 MED ORDER — HYDROMORPHONE HCL 1 MG/ML IJ SOLN
1.0000 mg | INTRAMUSCULAR | Status: DC | PRN
  Administered 2015-01-23 – 2015-01-24 (×9): 1 mg via INTRAVENOUS
  Filled 2015-01-23 (×9): qty 1

## 2015-01-23 MED ORDER — ACETAMINOPHEN 650 MG RE SUPP: 650.0000 mg | Freq: Four times a day (QID) | RECTAL | Status: DC | PRN

## 2015-01-23 MED ORDER — PANTOPRAZOLE SODIUM 40 MG PO TBEC
40.0000 mg | DELAYED_RELEASE_TABLET | Freq: Every day | ORAL | Status: DC
  Administered 2015-01-24 – 2015-01-25 (×2): 40 mg via ORAL
  Filled 2015-01-23 (×2): qty 1

## 2015-01-23 MED ORDER — PHENOL 1.4 % MT LIQD: 1.0000 | OROMUCOSAL | Status: DC | PRN

## 2015-01-23 MED ORDER — FLUOXETINE HCL 20 MG PO CAPS
40.0000 mg | ORAL_CAPSULE | Freq: Every day | ORAL | Status: DC
Start: 2015-01-24 — End: 2015-01-25
  Administered 2015-01-24 – 2015-01-25 (×2): 40 mg via ORAL
  Filled 2015-01-23 (×2): qty 2

## 2015-01-23 MED ORDER — ONDANSETRON HCL 4 MG/2ML IJ SOLN
INTRAMUSCULAR | Status: DC | PRN
  Administered 2015-01-23: 4 mg via INTRAVENOUS

## 2015-01-23 MED ORDER — METHOCARBAMOL 1000 MG/10ML IJ SOLN
500.0000 mg | Freq: Four times a day (QID) | INTRAVENOUS | Status: DC | PRN
  Filled 2015-01-23: qty 5

## 2015-01-23 MED ORDER — INSULIN ASPART 100 UNIT/ML ~~LOC~~ SOLN
0.0000 [IU] | Freq: Three times a day (TID) | SUBCUTANEOUS | Status: DC
  Administered 2015-01-23 – 2015-01-24 (×2): 5 [IU] via SUBCUTANEOUS
  Administered 2015-01-24: 3 [IU] via SUBCUTANEOUS
  Administered 2015-01-24: 5 [IU] via SUBCUTANEOUS
  Administered 2015-01-25 (×2): 3 [IU] via SUBCUTANEOUS

## 2015-01-23 MED ORDER — INSULIN ASPART 100 UNIT/ML ~~LOC~~ SOLN
0.0000 [IU] | Freq: Every day | SUBCUTANEOUS | Status: DC
  Administered 2015-01-23: 3 [IU] via SUBCUTANEOUS

## 2015-01-23 MED ORDER — NEOSTIGMINE METHYLSULFATE 10 MG/10ML IV SOLN
INTRAVENOUS | Status: DC | PRN
  Administered 2015-01-23: 4 mg via INTRAVENOUS

## 2015-01-23 MED ORDER — SODIUM CHLORIDE 0.9 % IJ SOLN
INTRAMUSCULAR | Status: AC
  Filled 2015-01-23: qty 10

## 2015-01-23 MED ORDER — SODIUM CHLORIDE 0.9 % IR SOLN
Status: DC | PRN
  Administered 2015-01-23: 1000 mL
  Administered 2015-01-23: 3000 mL

## 2015-01-23 MED ORDER — CEFAZOLIN SODIUM 1-5 GM-% IV SOLN
1.0000 g | Freq: Four times a day (QID) | INTRAVENOUS | Status: AC
  Administered 2015-01-23 (×2): 1 g via INTRAVENOUS
  Filled 2015-01-23 (×2): qty 50

## 2015-01-23 MED ORDER — ONDANSETRON HCL 4 MG/2ML IJ SOLN
INTRAMUSCULAR | Status: AC
  Filled 2015-01-23: qty 2

## 2015-01-23 MED ORDER — ACETAMINOPHEN 325 MG PO TABS
650.0000 mg | ORAL_TABLET | Freq: Four times a day (QID) | ORAL | Status: DC | PRN
Start: 2015-01-23 — End: 2015-01-25

## 2015-01-23 MED ORDER — METHOCARBAMOL 500 MG PO TABS
500.0000 mg | ORAL_TABLET | Freq: Four times a day (QID) | ORAL | Status: DC | PRN
Start: 2015-01-23 — End: 2015-01-25
  Administered 2015-01-23 – 2015-01-25 (×6): 500 mg via ORAL
  Filled 2015-01-23 (×5): qty 1

## 2015-01-23 MED ORDER — ATORVASTATIN CALCIUM 20 MG PO TABS
20.0000 mg | ORAL_TABLET | Freq: Every morning | ORAL | Status: DC
  Administered 2015-01-24 – 2015-01-25 (×2): 20 mg via ORAL
  Filled 2015-01-23 (×2): qty 1

## 2015-01-23 MED ORDER — SODIUM CHLORIDE 0.9 % IV SOLN
INTRAVENOUS | Status: DC
  Administered 2015-01-24: 03:00:00 via INTRAVENOUS

## 2015-01-23 MED ORDER — EPHEDRINE SULFATE 50 MG/ML IJ SOLN
INTRAMUSCULAR | Status: AC
  Filled 2015-01-23: qty 1

## 2015-01-23 MED ORDER — PHENYLEPHRINE 40 MCG/ML (10ML) SYRINGE FOR IV PUSH (FOR BLOOD PRESSURE SUPPORT)
PREFILLED_SYRINGE | INTRAVENOUS | Status: AC
  Filled 2015-01-23: qty 10

## 2015-01-23 MED ORDER — BUPROPION HCL 100 MG PO TABS
300.0000 mg | ORAL_TABLET | Freq: Every day | ORAL | Status: DC
  Administered 2015-01-24 – 2015-01-25 (×2): 300 mg via ORAL
  Filled 2015-01-23 (×2): qty 3

## 2015-01-23 MED ORDER — TRANEXAMIC ACID 100 MG/ML IV SOLN
1000.0000 mg | INTRAVENOUS | Status: AC
  Administered 2015-01-23: 1000 mg via INTRAVENOUS
  Filled 2015-01-23: qty 10

## 2015-01-23 MED ORDER — BUPIVACAINE-EPINEPHRINE (PF) 0.5% -1:200000 IJ SOLN
INTRAMUSCULAR | Status: DC | PRN
Start: 2015-01-23 — End: 2015-01-23
  Administered 2015-01-23: 30 mL via PERINEURAL

## 2015-01-23 MED ORDER — OXYCODONE HCL 5 MG PO TABS
ORAL_TABLET | ORAL | Status: AC
  Filled 2015-01-23: qty 2

## 2015-01-23 MED ORDER — ONDANSETRON HCL 4 MG/2ML IJ SOLN
4.0000 mg | Freq: Four times a day (QID) | INTRAMUSCULAR | Status: DC | PRN
Start: 2015-01-23 — End: 2015-01-25
  Administered 2015-01-23 – 2015-01-24 (×2): 4 mg via INTRAVENOUS
  Filled 2015-01-23: qty 2

## 2015-01-23 MED ORDER — GLYCOPYRROLATE 0.2 MG/ML IJ SOLN
INTRAMUSCULAR | Status: AC
  Filled 2015-01-23: qty 3

## 2015-01-23 MED ORDER — MEPERIDINE HCL 25 MG/ML IJ SOLN: 6.2500 mg | INTRAMUSCULAR | Status: DC | PRN

## 2015-01-23 MED ORDER — ROCURONIUM BROMIDE 50 MG/5ML IV SOLN
INTRAVENOUS | Status: AC
  Filled 2015-01-23: qty 1

## 2015-01-23 MED ORDER — DEXAMETHASONE SODIUM PHOSPHATE 4 MG/ML IJ SOLN
INTRAMUSCULAR | Status: AC
  Filled 2015-01-23: qty 1

## 2015-01-23 MED ORDER — LACTATED RINGERS IV SOLN
INTRAVENOUS | Status: DC | PRN
  Administered 2015-01-23 (×2): via INTRAVENOUS

## 2015-01-23 MED ORDER — LOSARTAN POTASSIUM 50 MG PO TABS
100.0000 mg | ORAL_TABLET | Freq: Every morning | ORAL | Status: DC
  Administered 2015-01-24 – 2015-01-25 (×2): 100 mg via ORAL
  Filled 2015-01-23 (×2): qty 2

## 2015-01-23 MED ORDER — FLEET ENEMA 7-19 GM/118ML RE ENEM: 1.0000 | ENEMA | Freq: Once | RECTAL | Status: AC | PRN

## 2015-01-23 MED ORDER — FENTANYL CITRATE 0.05 MG/ML IJ SOLN
INTRAMUSCULAR | Status: AC
  Filled 2015-01-23: qty 2

## 2015-01-23 MED ORDER — MENTHOL 3 MG MT LOZG: 1.0000 | LOZENGE | OROMUCOSAL | Status: DC | PRN

## 2015-01-23 MED ORDER — METOCLOPRAMIDE HCL 5 MG PO TABS
5.0000 mg | ORAL_TABLET | Freq: Three times a day (TID) | ORAL | Status: DC | PRN
  Filled 2015-01-23: qty 2

## 2015-01-23 MED ORDER — FENTANYL CITRATE 0.05 MG/ML IJ SOLN
INTRAMUSCULAR | Status: DC | PRN
Start: 2015-01-23 — End: 2015-01-23
  Administered 2015-01-23 (×2): 50 ug via INTRAVENOUS

## 2015-01-23 MED ORDER — LIDOCAINE HCL (CARDIAC) 20 MG/ML IV SOLN
INTRAVENOUS | Status: AC
  Filled 2015-01-23: qty 5

## 2015-01-23 MED ORDER — OXYCODONE HCL 5 MG PO TABS
5.0000 mg | ORAL_TABLET | ORAL | Status: DC | PRN
  Administered 2015-01-23 – 2015-01-24 (×5): 10 mg via ORAL
  Filled 2015-01-23 (×4): qty 2

## 2015-01-23 MED ORDER — RIVAROXABAN 10 MG PO TABS
10.0000 mg | ORAL_TABLET | Freq: Every day | ORAL | Status: DC
  Administered 2015-01-24 – 2015-01-25 (×2): 10 mg via ORAL
  Filled 2015-01-23 (×3): qty 1

## 2015-01-23 MED ORDER — TOPIRAMATE 100 MG PO TABS
100.0000 mg | ORAL_TABLET | Freq: Every day | ORAL | Status: DC
  Administered 2015-01-23 – 2015-01-24 (×2): 100 mg via ORAL
  Filled 2015-01-23 (×4): qty 1

## 2015-01-23 MED ORDER — SODIUM CHLORIDE 0.9 % IV SOLN: INTRAVENOUS | Status: DC

## 2015-01-23 MED ORDER — MIDAZOLAM HCL 2 MG/2ML IJ SOLN
INTRAMUSCULAR | Status: AC
  Filled 2015-01-23: qty 2

## 2015-01-23 MED ORDER — PROMETHAZINE HCL 25 MG/ML IJ SOLN
INTRAMUSCULAR | Status: AC
  Filled 2015-01-23: qty 1

## 2015-01-23 MED ORDER — METOCLOPRAMIDE HCL 5 MG/ML IJ SOLN
5.0000 mg | Freq: Three times a day (TID) | INTRAMUSCULAR | Status: DC | PRN
  Administered 2015-01-25: 10 mg via INTRAVENOUS
  Filled 2015-01-23: qty 2

## 2015-01-23 MED ORDER — FENTANYL CITRATE 0.05 MG/ML IJ SOLN
INTRAMUSCULAR | Status: AC
  Filled 2015-01-23: qty 5

## 2015-01-23 MED ORDER — SORBITOL 70 % SOLN: 30.0000 mL | Freq: Every day | Status: DC | PRN

## 2015-01-23 MED ORDER — DOCUSATE SODIUM 100 MG PO CAPS
100.0000 mg | ORAL_CAPSULE | Freq: Two times a day (BID) | ORAL | Status: DC
  Administered 2015-01-23 – 2015-01-25 (×4): 100 mg via ORAL
  Filled 2015-01-23 (×5): qty 1

## 2015-01-23 MED ORDER — MIDAZOLAM HCL 5 MG/5ML IJ SOLN
INTRAMUSCULAR | Status: DC | PRN
  Administered 2015-01-23 (×2): 1 mg via INTRAVENOUS

## 2015-01-23 MED ORDER — SENNOSIDES-DOCUSATE SODIUM 8.6-50 MG PO TABS: 1.0000 | ORAL_TABLET | Freq: Every evening | ORAL | Status: DC | PRN

## 2015-01-23 MED ORDER — PHENYLEPHRINE HCL 10 MG/ML IJ SOLN
INTRAMUSCULAR | Status: DC | PRN
  Administered 2015-01-23: 40 ug via INTRAVENOUS
  Administered 2015-01-23: 80 ug via INTRAVENOUS
  Administered 2015-01-23: 40 ug via INTRAVENOUS

## 2015-01-23 MED ORDER — NEOSTIGMINE METHYLSULFATE 10 MG/10ML IV SOLN
INTRAVENOUS | Status: AC
  Filled 2015-01-23: qty 1

## 2015-01-23 MED ORDER — ONDANSETRON HCL 4 MG PO TABS
4.0000 mg | ORAL_TABLET | Freq: Four times a day (QID) | ORAL | Status: DC | PRN
  Administered 2015-01-25: 4 mg via ORAL
  Filled 2015-01-23 (×2): qty 1

## 2015-01-23 MED ORDER — EPHEDRINE SULFATE 50 MG/ML IJ SOLN
INTRAMUSCULAR | Status: DC | PRN
  Administered 2015-01-23: 5 mg via INTRAVENOUS
  Administered 2015-01-23: 10 mg via INTRAVENOUS

## 2015-01-23 SURGICAL SUPPLY — 65 items
BANDAGE ELASTIC 4 VELCRO ST LF (GAUZE/BANDAGES/DRESSINGS) ×2 IMPLANT
BANDAGE ELASTIC 6 VELCRO ST LF (GAUZE/BANDAGES/DRESSINGS) ×2 IMPLANT
BANDAGE ESMARK 6X9 LF (GAUZE/BANDAGES/DRESSINGS) ×1 IMPLANT
BLADE SAGITTAL 25.0X1.19X90 (BLADE) ×2 IMPLANT
BLADE SAW SAG 90X13X1.27 (BLADE) ×2 IMPLANT
BNDG CMPR 9X6 STRL LF SNTH (GAUZE/BANDAGES/DRESSINGS) ×1
BNDG ESMARK 6X9 LF (GAUZE/BANDAGES/DRESSINGS) ×2
BONE CEMENT GENTAMICIN (Cement) ×4 IMPLANT
BOWL SMART MIX CTS (DISPOSABLE) ×2 IMPLANT
CAP KNEE TOTAL 3 SIGMA ×1 IMPLANT
CEMENT BONE GENTAMICIN 40 (Cement) IMPLANT
COVER SURGICAL LIGHT HANDLE (MISCELLANEOUS) ×2 IMPLANT
CUFF TOURNIQUET SINGLE 34IN LL (TOURNIQUET CUFF) ×2 IMPLANT
CUFF TOURNIQUET SINGLE 44IN (TOURNIQUET CUFF) IMPLANT
DRAPE IMP U-DRAPE 54X76 (DRAPES) ×2 IMPLANT
DRAPE INCISE IOBAN 66X45 STRL (DRAPES) IMPLANT
DRAPE ORTHO SPLIT 77X108 STRL (DRAPES) ×4
DRAPE SURG ORHT 6 SPLT 77X108 (DRAPES) ×2 IMPLANT
DRAPE U-SHAPE 47X51 STRL (DRAPES) ×2 IMPLANT
DRSG ADAPTIC 3X8 NADH LF (GAUZE/BANDAGES/DRESSINGS) ×2 IMPLANT
DRSG PAD ABDOMINAL 8X10 ST (GAUZE/BANDAGES/DRESSINGS) ×4 IMPLANT
DURAPREP 26ML APPLICATOR (WOUND CARE) ×2 IMPLANT
ELECT REM PT RETURN 9FT ADLT (ELECTROSURGICAL) ×2
ELECTRODE REM PT RTRN 9FT ADLT (ELECTROSURGICAL) ×1 IMPLANT
EVACUATOR 1/8 PVC DRAIN (DRAIN) ×2 IMPLANT
FACESHIELD WRAPAROUND (MASK) ×2 IMPLANT
FACESHIELD WRAPAROUND OR TEAM (MASK) ×2 IMPLANT
FLOSEAL 10ML (HEMOSTASIS) IMPLANT
GAUZE SPONGE 4X4 12PLY STRL (GAUZE/BANDAGES/DRESSINGS) ×2 IMPLANT
GLOVE BIOGEL PI IND STRL 8 (GLOVE) ×4 IMPLANT
GLOVE BIOGEL PI INDICATOR 8 (GLOVE) ×2
GLOVE ORTHO TXT STRL SZ7.5 (GLOVE) ×4 IMPLANT
GLOVE SURG ORTHO 8.0 STRL STRW (GLOVE) ×4 IMPLANT
GOWN STRL REUS W/ TWL LRG LVL3 (GOWN DISPOSABLE) ×2 IMPLANT
GOWN STRL REUS W/ TWL XL LVL3 (GOWN DISPOSABLE) ×1 IMPLANT
GOWN STRL REUS W/TWL 2XL LVL3 (GOWN DISPOSABLE) ×1 IMPLANT
GOWN STRL REUS W/TWL LRG LVL3 (GOWN DISPOSABLE) ×4
GOWN STRL REUS W/TWL XL LVL3 (GOWN DISPOSABLE) ×2
HANDPIECE INTERPULSE COAX TIP (DISPOSABLE) ×2
HOOD PEEL AWAY FACE SHEILD DIS (HOOD) ×2 IMPLANT
IMMOBILIZER KNEE 22 UNIV (SOFTGOODS) ×1 IMPLANT
KIT BASIN OR (CUSTOM PROCEDURE TRAY) ×2 IMPLANT
KIT ROOM TURNOVER OR (KITS) ×2 IMPLANT
MANIFOLD NEPTUNE II (INSTRUMENTS) ×2 IMPLANT
NEEDLE 22X1 1/2 (OR ONLY) (NEEDLE) IMPLANT
NS IRRIG 1000ML POUR BTL (IV SOLUTION) ×2 IMPLANT
PACK TOTAL JOINT (CUSTOM PROCEDURE TRAY) ×2 IMPLANT
PACK UNIVERSAL I (CUSTOM PROCEDURE TRAY) ×2 IMPLANT
PAD ARMBOARD 7.5X6 YLW CONV (MISCELLANEOUS) ×4 IMPLANT
PAD CAST 4YDX4 CTTN HI CHSV (CAST SUPPLIES) ×1 IMPLANT
PADDING CAST COTTON 4X4 STRL (CAST SUPPLIES) ×2
PADDING CAST COTTON 6X4 STRL (CAST SUPPLIES) ×2 IMPLANT
SET HNDPC FAN SPRY TIP SCT (DISPOSABLE) ×1 IMPLANT
STAPLER VISISTAT 35W (STAPLE) ×2 IMPLANT
SUCTION FRAZIER TIP 10 FR DISP (SUCTIONS) ×2 IMPLANT
SUT ETHIBOND NAB CT1 #1 30IN (SUTURE) ×6 IMPLANT
SUT VIC AB 0 CT1 27 (SUTURE) ×2
SUT VIC AB 0 CT1 27XBRD ANBCTR (SUTURE) ×1 IMPLANT
SUT VIC AB 2-0 CT1 27 (SUTURE) ×4
SUT VIC AB 2-0 CT1 TAPERPNT 27 (SUTURE) ×2 IMPLANT
SYR CONTROL 10ML LL (SYRINGE) IMPLANT
TOWEL OR 17X24 6PK STRL BLUE (TOWEL DISPOSABLE) ×2 IMPLANT
TOWEL OR 17X26 10 PK STRL BLUE (TOWEL DISPOSABLE) ×2 IMPLANT
TRAY FOLEY CATH 16FRSI W/METER (SET/KITS/TRAYS/PACK) IMPLANT
WATER STERILE IRR 1000ML POUR (IV SOLUTION) ×2 IMPLANT

## 2015-01-23 NOTE — Anesthesia Postprocedure Evaluation (Signed)
  Anesthesia Post-op Note  Patient: Morgan Taylor  Procedure(s) Performed: Procedure(s): TOTAL KNEE ARTHROPLASTY (Left)  Patient Location: PACU  Anesthesia Type:General  Level of Consciousness: awake and alert   Airway and Oxygen Therapy: Patient Spontanous Breathing and Patient connected to nasal cannula oxygen  Post-op Pain: mild  Post-op Assessment: Post-op Vital signs reviewed and Patient's Cardiovascular Status Stable  Post-op Vital Signs: Reviewed and stable  Last Vitals:  Filed Vitals:   01/23/15 1008  BP:   Pulse: 69  Temp:   Resp: 15    Complications: No apparent anesthesia complications

## 2015-01-23 NOTE — H&P (View-Only) (Signed)
TOTAL KNEE ADMISSION H&P  Patient is being admitted for left total knee arthroplasty.  Subjective:  Chief Complaint:left knee pain.  HPI: Morgan Taylor, 54 y.o. female, has a history of pain and functional disability in the left knee due to arthritis and has failed non-surgical conservative treatments for greater than 12 weeks to includeNSAID's and/or analgesics, corticosteriod injections, viscosupplementation injections, use of assistive devices and activity modification.  Onset of symptoms was gradual, starting 10 years ago with gradually worsening course since that time. The patient noted no past surgery on the left knee(s).  Patient currently rates pain in the left knee(s) at 10 out of 10 with activity. Patient has night pain, worsening of pain with activity and weight bearing, pain that interferes with activities of daily living, pain with passive range of motion, crepitus and joint swelling.  Patient has evidence of periarticular osteophytes and joint space narrowing by imaging studies.  There is no active infection.  Patient Active Problem List   Diagnosis Date Noted  . Osteoarthritis of right knee 08/15/2014  . Cancer of lower-outer quadrant of female breast 01/23/2012  . SNORING 05/03/2007  . HYPERGLYCEMIA 05/03/2007  . PHARYNGITIS, ACUTE 02/22/2007  . HYPERPLASIA, ENDOMETRIAL NOS 02/22/2007  . DYSMENORRHEA 10/18/2006  . DEGENERATIVE JOINT DISEASE 10/18/2006  . INSOMNIA 10/18/2006  . CHEST PAIN 10/18/2006  . OBESITY 10/05/2006  . ANXIETY 10/05/2006  . HYPERTENSION 10/05/2006  . GASTROENTERITIS 10/05/2006  . VAGINAL BLEEDING 10/05/2006  . MENORRHAGIA, PERIMENOPAUSAL 10/05/2006  . 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 Diabetes mellitus   . Hypertension   . Arthritis   . Family history of anesthesia complication     Patients son has difficulty waking up after anesthesia  . Hypothyroidism   . Anxiety   . Depression   . Cancer 2009    lumpectomy  . Hyperlipemia   . GERD (gastroesophageal reflux disease)     Past Surgical History  Procedure Laterality Date  . Appendectomy    . Shoulder arthroscopy Left     cleaned out joint  . Cesarean section    . Dilation and curettage of uterus    . Breast lumpectomy Left   . Colonoscopy    . Total knee arthroplasty Right 08/15/2014    Procedure: RIGHT TOTAL KNEE ARTHROPLASTY;  Surgeon: Yvette Rack., MD;  Location: [facility];  Service: Orthopedics;  Laterality: Right;     (Not in a hospital admission) Allergies  Allergen Reactions  . Sulfonamide Derivatives Hives    History  Substance Use Topics  . Smoking status: Never Smoker   . Smokeless tobacco: Never Used  . Alcohol Use: No    Family History  Problem Relation Age of Onset  . Colon cancer Neg Hx   . Rectal cancer Neg Hx   . Stomach cancer Neg Hx   . Cancer Paternal Grandmother     NOS  . Pancreatic cancer Paternal Aunt   . Cancer Paternal Aunt     NOS     Review of Systems  Constitutional: Negative.   HENT: Negative.   Eyes: Negative.   Respiratory: Negative.   Cardiovascular: Negative.   Gastrointestinal: Negative.   Genitourinary: Negative.   Musculoskeletal: Positive for joint pain. Negative for falls.  Skin: Negative.   Neurological: Negative.   Endo/Heme/Allergies: Negative.   Psychiatric/Behavioral: Negative.     Objective:  Physical Exam  Constitutional: She is oriented to person, place, and time. She appears well-developed and well-nourished.  No distress.  HENT:  Head: Normocephalic and atraumatic.  Nose: Nose normal.  Eyes: Conjunctivae and EOM are normal. Pupils are equal, round, and reactive to light.  Neck: Normal range of motion. Neck supple.  Cardiovascular: Normal rate, regular rhythm, normal heart sounds and intact distal pulses.   Respiratory: Effort normal and breath sounds normal. No respiratory distress. She has no wheezes.  GI: Soft. Bowel sounds are normal. She exhibits  no distension. There is no tenderness.  Musculoskeletal:       Left knee: She exhibits decreased range of motion, swelling and bony tenderness. She exhibits no effusion, no laceration, no erythema, no LCL laxity and no MCL laxity. Tenderness found. Medial joint line and lateral joint line tenderness noted.  Lymphadenopathy:    She has no cervical adenopathy.  Neurological: She is alert and oriented to person, place, and time. No cranial nerve deficit.  Skin: Skin is warm and dry. No rash noted. No erythema. No pallor.  Psychiatric: She has a normal mood and affect. Her behavior is normal.    Vital signs in last 24 hours: @VSRANGES @  Labs:   Estimated body mass index is 30.59 kg/(m^2) as calculated from the following:   Height as of 10/13/14: 5\' 2"  (1.575 m).   Weight as of 10/13/14: 75.887 kg (167 lb 4.8 oz).   Imaging Review Plain radiographs demonstrate severe degenerative joint disease of the left knee(s). The overall alignment ismild varus. The bone quality appears to be good for age and reported activity level.  Assessment/Plan:  End stage arthritis, left knee   The patient history, physical examination, clinical judgment of the provider and imaging studies are consistent with end stage degenerative joint disease of the left knee(s) and total knee arthroplasty is deemed medically necessary. The treatment options including medical management, injection therapy arthroscopy and arthroplasty were discussed at length. The risks and benefits of total knee arthroplasty were presented and reviewed. The risks due to aseptic loosening, infection, stiffness, patella tracking problems, thromboembolic complications and other imponderables were discussed. The patient acknowledged the explanation, agreed to proceed with the plan and consent was signed. Patient is being admitted for inpatient treatment for surgery, pain control, PT, OT, prophylactic antibiotics, VTE prophylaxis, progressive  ambulation and ADL's and discharge planning. The patient is planning to be discharged home with home health services

## 2015-01-23 NOTE — Progress Notes (Signed)
Orthopedic Tech Progress Note Patient Details:  Morgan Taylor 1961-11-22 [id number]  CPM Left Knee CPM Left Knee: On Left Knee Flexion (Degrees): 90 Left Knee Extension (Degrees): 0 Additional Comments: trapeze bar patient helper Viewed order from doctor's order list  Hildred Priest 01/23/2015, 10:46 AM

## 2015-01-23 NOTE — Progress Notes (Signed)
Physical Therapy Evaluation Patient Details Name: Morgan Taylor MRN: [id number] DOB: 1961/01/31 Today's Date: 01/23/2015   History of Present Illness  Patient is a 54 yo female admitted 01/23/15 now s/p Lt TKA.  PMH:  OA, anxiety, depression, HTN, DM, hypothyroidism, Rt TKA 07/2014  Clinical Impression  Patient presents with problems listed below.  Will benefit from acute PT to maximize independence prior to discharge home with family.    Follow Up Recommendations Home health PT;Supervision/Assistance - 24 hour    Equipment Recommendations  None recommended by PT    Recommendations for Other Services       Precautions / Restrictions Precautions Precautions: Knee Precaution Booklet Issued: Yes (comment) Precaution Comments: Reviewed precautions with patient. Required Braces or Orthoses: Knee Immobilizer - Left Knee Immobilizer - Left: On when out of bed or walking Restrictions Weight Bearing Restrictions: Yes LLE Weight Bearing: Weight bearing as tolerated      Mobility  Bed Mobility Overal bed mobility: Needs Assistance Bed Mobility: Supine to Sit     Supine to sit: Min guard     General bed mobility comments: Instructed patient on donning KI on LLE.  Verbal cues for mobility technique.  Assist for safety only.  Transfers Overall transfer level: Needs assistance Equipment used: Rolling walker (2 wheeled) Transfers: Sit to/from [facility] Sit to Stand: Min assist Stand pivot transfers: Min assist       General transfer comment: Verbal cues for hand placement and technique.  Assist to rise to standing and for balance.  Patient able to take several "hop" steps to pivot to chair.  Ambulation/Gait                Stairs            Wheelchair Mobility    Modified Rankin (Stroke Patients Only)       Balance                                             Pertinent Vitals/Pain Pain Assessment: No/denies pain     Home Living Family/patient expects to be discharged to:: Private residence Living Arrangements: Children Available Help at Discharge: Family;Available PRN/intermittently (will be available 24 hours initially) Type of Home: House Home Access: Stairs to enter Entrance Stairs-Rails: None Entrance Stairs-Number of Steps: 3 Home Layout: One level Home Equipment: Walker - 2 wheels;Bedside commode      Prior Function Level of Independence: Independent               Hand Dominance   Dominant Hand: Right    Extremity/Trunk Assessment   Upper Extremity Assessment: Overall WFL for tasks assessed           Lower Extremity Assessment: LLE deficits/detail   LLE Deficits / Details: Decreased strength/ROM post-op.  Patient in CPM as PT entered room.  Patient had pulled foot away from foot plate, and knee was flexed.  Instructed patient to keep LLE relaxed in CPM with foot against plate.  Cervical / Trunk Assessment: Normal  Communication   Communication: No difficulties  Cognition Arousal/Alertness: Awake/alert Behavior During Therapy: WFL for tasks assessed/performed Overall Cognitive Status: Within Functional Limits for tasks assessed                      General Comments      Exercises Total Joint Exercises Ankle Circles/Pumps:  AROM;Both;10 reps;Seated      Assessment/Plan    PT Assessment Patient needs continued PT services  PT Diagnosis Difficulty walking   PT Problem List Decreased strength;Decreased range of motion;Decreased activity tolerance;Decreased balance;Decreased mobility;Decreased knowledge of use of DME;Decreased knowledge of precautions  PT Treatment Interventions DME instruction;Gait training;Stair training;Functional mobility training;Therapeutic activities;Therapeutic exercise;Patient/family education   PT Goals (Current goals can be found in the Care Plan section) Acute Rehab PT Goals Patient Stated Goal: To walk PT Goal Formulation:  With patient Time For Goal Achievement: 01/30/15 Potential to Achieve Goals: Good    Frequency 7X/week   Barriers to discharge Decreased caregiver support Will have 24 hour assist for limited time.    Co-evaluation               End of Session Equipment Utilized During Treatment: Gait belt;Left knee immobilizer;Oxygen Activity Tolerance: Patient tolerated treatment well Patient left: in chair;with call bell/phone within reach Nurse Communication: Mobility status         Time: 1791-5056 PT Time Calculation (min) (ACUTE ONLY): 25 min   Charges:   PT Evaluation $Initial PT Evaluation Tier I: 1 Procedure PT Treatments $Therapeutic Activity: 8-22 mins   PT G Codes:        Despina Pole 2015/02/01, 5:30 PM Carita Pian. Sanjuana Kava, [place] Pager [phone]

## 2015-01-23 NOTE — Interval H&P Note (Signed)
History and Physical Interval Note:  01/23/2015 7:27 AM  Morgan Taylor  has presented today for surgery, with the diagnosis of LEFT KNEE OA  The various methods of treatment have been discussed with the patient and family. After consideration of risks, benefits and other options for treatment, the patient has consented to  Procedure(s): TOTAL KNEE ARTHROPLASTY (Left) as a surgical intervention .  The patient's history has been reviewed, patient examined, no change in status, stable for surgery.  I have reviewed the patient's chart and labs.  Questions were answered to the patient's satisfaction.     [name],W D

## 2015-01-23 NOTE — Brief Op Note (Signed)
01/23/2015  9:49 AM  PATIENT:  Morgan Taylor  54 y.o. female  PRE-OPERATIVE DIAGNOSIS:  LEFT KNEE OA  POST-OPERATIVE DIAGNOSIS:  LEFT KNEE OA  PROCEDURE:  Procedure(s): TOTAL KNEE ARTHROPLASTY (Left)  SURGEON:  Surgeon(s) and Role:    * W D Valeta Harms., MD - Primary  PHYSICIAN ASSISTANT: Chriss Czar, PA-C  ASSISTANTS:   ANESTHESIA:   regional and general  EBL:  Total I/O In: 1050 [I.V.:1050] Out: 250 [Blood:250]  BLOOD ADMINISTERED:none  DRAINS: 1 hemovac drain lateral left knee self suction   LOCAL MEDICATIONS USED:  NONE  SPECIMEN:  No Specimen  DISPOSITION OF SPECIMEN:  N/A  COUNTS:  YES  TOURNIQUET:   Total Tourniquet Time Documented: Thigh (Left) - 65 minutes Total: Thigh (Left) - 65 minutes   DICTATION: .Other Dictation: Dictation Number unknown  PLAN OF CARE: Admit to inpatient   PATIENT DISPOSITION:  PACU - hemodynamically stable.   Delay start of Pharmacological VTE agent (>24hrs) due to surgical blood loss or risk of bleeding: yes

## 2015-01-23 NOTE — Progress Notes (Signed)
Utilization review completed.  

## 2015-01-23 NOTE — Anesthesia Procedure Notes (Addendum)
Procedure Name: Intubation Date/Time: 01/23/2015 7:44 AM Performed by: Williemae Area B Pre-anesthesia Checklist: Patient identified, Emergency Drugs available, Suction available and Patient being monitored Patient Re-evaluated:Patient Re-evaluated prior to inductionOxygen Delivery Method: Circle system utilized Preoxygenation: Pre-oxygenation with 100% oxygen Intubation Type: IV induction Ventilation: Mask ventilation without difficulty and Oral airway inserted - appropriate to patient size Laryngoscope Size: Mac and 3 Grade View: Grade I Tube size: 7.0 mm Number of attempts: 1 Airway Equipment and Method: Stylet Placement Confirmation: ETT inserted through vocal cords under direct vision,  breath sounds checked- equal and bilateral and positive ETCO2 Secured at: 21 (cm at teeth) cm Tube secured with: Tape   Anesthesia Regional Block:  Femoral nerve block  Pre-Anesthetic Checklist: ,, timeout performed, Correct Patient, Correct Site, Correct Laterality, Correct Procedure, Correct Position, site marked, Risks and benefits discussed,  Surgical consent,  Pre-op evaluation,  At surgeon's request and post-op pain management  Laterality: Left and Lower  Prep: Maximum Sterile Barrier Precautions used and chloraprep       Needles:  Injection technique: Single-shot  Needle Type: Echogenic Stimulator Needle     Needle Length: 10cm 10 cm Needle Gauge: 21 and 21 G    Additional Needles:  Procedures: ultrasound guided (picture in chart) and nerve stimulator Femoral nerve block  Nerve Stimulator or Paresthesia:  Response: 0.3 mA,   Additional Responses:   Narrative:  Start time: 01/23/2015 7:10 AM End time: 01/23/2015 7:29 AM  Performed by: Personally  Anesthesiologist: Alexis Frock  Additional Notes: L femoral block with [place] and stimulator, 44ml .5% marcaine with epi, multiple asp, talked to patient throughout procedure, no complications

## 2015-01-23 NOTE — Transfer of Care (Signed)
Immediate Anesthesia Transfer of Care Note  Patient: Morgan Taylor  Procedure(s) Performed: Procedure(s): TOTAL KNEE ARTHROPLASTY (Left)  Patient Location: PACU  Anesthesia Type:General  Level of Consciousness: awake, alert , oriented and patient cooperative  Airway & Oxygen Therapy: Patient Spontanous Breathing and Patient connected to nasal cannula oxygen  Post-op Assessment: Report given to RN, Post -op Vital signs reviewed and stable and Patient moving all extremities  Post vital signs: Reviewed and stable  Last Vitals:  Filed Vitals:   01/23/15 0958  BP:   Pulse:   Temp: 36.7 C  Resp:     Complications: No apparent anesthesia complications

## 2015-01-24 LAB — BASIC METABOLIC PANEL
Anion gap: 6 (ref 5–15)
BUN: 9 mg/dL (ref 6–23)
CALCIUM: 8.6 mg/dL (ref 8.4–10.5)
CO2: 25 mmol/L (ref 19–32)
Chloride: 101 mmol/L (ref 96–112)
Creatinine, Ser: 0.78 mg/dL (ref 0.50–1.10)
GFR calc Af Amer: >90 mL/min (ref >90)
GFR calc non Af Amer: >90 mL/min (ref >90)
Glucose, Bld: 176 mg/dL — ABNORMAL HIGH (ref 70–99)
Potassium: 3.6 mmol/L (ref 3.5–5.1)
Sodium: 132 mmol/L — ABNORMAL LOW (ref 135–145)

## 2015-01-24 LAB — GLUCOSE, CAPILLARY
GLUCOSE-CAPILLARY: 169 mg/dL — AB (ref 70–99)
GLUCOSE-CAPILLARY: 232 mg/dL — AB (ref 70–99)
GLUCOSE-CAPILLARY: 178 mg/dL — AB (ref 70–99)
Glucose-Capillary: 212 mg/dL — ABNORMAL HIGH (ref 70–99)

## 2015-01-24 LAB — CBC
HEMATOCRIT: 29.6 % — AB (ref 36.0–46.0)
Hemoglobin: 9.4 g/dL — ABNORMAL LOW (ref 12.0–15.0)
MCH: 29.5 pg (ref 26.0–34.0)
MCHC: 31.8 g/dL (ref 30.0–36.0)
MCV: 92.8 fL (ref 78.0–100.0)
PLATELETS: 243 10*3/uL (ref 150–400)
RBC: 3.19 MIL/uL — AB (ref 3.87–5.11)
RDW: 14.2 % (ref 11.5–15.5)
WBC: 9.3 10*3/uL (ref 4.0–10.5)

## 2015-01-24 MED ORDER — OXYCODONE HCL ER 10 MG PO T12A
20.0000 mg | EXTENDED_RELEASE_TABLET | Freq: Two times a day (BID) | ORAL | Status: DC
  Administered 2015-01-24 – 2015-01-25 (×2): 20 mg via ORAL
  Filled 2015-01-24 (×2): qty 2

## 2015-01-24 MED ORDER — OXYCODONE HCL 5 MG PO TABS
15.0000 mg | ORAL_TABLET | ORAL | Status: DC | PRN
  Administered 2015-01-24 (×4): 20 mg via ORAL
  Administered 2015-01-25: 15 mg via ORAL
  Administered 2015-01-25: 20 mg via ORAL
  Filled 2015-01-24 (×4): qty 4
  Filled 2015-01-24: qty 3
  Filled 2015-01-24: qty 4

## 2015-01-24 NOTE — Evaluation (Signed)
Occupational Therapy Evaluation Patient Details Name: Morgan Taylor MRN: [id number] DOB: April 24, 1961 Today's Date: 01/24/2015    History of Present Illness Patient is a 54 yo female admitted 01/23/15 now s/p Lt TKA.  PMH:  OA, anxiety, depression, HTN, DM, hypothyroidism, Rt TKA 07/2014   Clinical Impression   Pt s/p above. Pt independent with ADLs, PTA. Feel pt will benefit from acute OT to increase independence with BADLs prior to d/c.     Follow Up Recommendations  No OT follow up;Supervision/Assistance - 24 hour    Equipment Recommendations  None recommended by OT    Recommendations for Other Services       Precautions / Restrictions Precautions Precautions: Knee Precaution Booklet Issued: No Precaution Comments: educated on no twisting knee Required Braces or Orthoses: Knee Immobilizer - Left Knee Immobilizer - Left: On when out of bed or walking Restrictions Weight Bearing Restrictions: Yes LLE Weight Bearing: Weight bearing as tolerated      Mobility Bed Mobility Overal bed mobility: Needs Assistance Bed Mobility: Sit to Supine;Supine to Sit     Supine to sit: Supervision Sit to supine: Supervision   General bed mobility comments: supervision for safety  Transfers Overall transfer level: Needs assistance Equipment used: Rolling walker (2 wheeled) Transfers: Sit to/from Stand Sit to Stand: Min guard         General transfer comment: cues for hand placement/technique.    Balance    No LOB in session-pt used walker for ambulation                                        ADL Overall ADL's : Needs assistance/impaired     Grooming: Wash/dry hands;Set up;Supervision/safety;Standing               Lower Body Dressing: Min guard;Sit to/from stand   Toilet Transfer: Min guard;Ambulation;RW (3 in 1 over commode)   Toileting- Clothing Manipulation and Hygiene: Min guard;Sit to/from stand       Functional mobility during ADLs: Min  guard;Rolling walker General ADL Comments: Educated on LB dressing technique as well as safety such as safe shoewear and use of bag on walker. Explained benefit of reaching down to left foot as it allows knee to bend. Educated on shower chair options and tub transfer techniques.  Discussed use of reacher.       Vision                     Perception     Praxis      Pertinent Vitals/Pain Pain Assessment: 0-10 Pain Score: 9  Pain Location: left knee Pain Intervention(s): Monitored during session;Limited activity within patient's tolerance;Ice applied     Hand Dominance Right   Extremity/Trunk Assessment Upper Extremity Assessment Upper Extremity Assessment: Overall WFL for tasks assessed   Lower Extremity Assessment Lower Extremity Assessment: Defer to PT evaluation   Cervical / Trunk Assessment Cervical / Trunk Assessment: Normal   Communication Communication Communication: No difficulties   Cognition Arousal/Alertness: Awake/alert Behavior During Therapy: WFL for tasks assessed/performed Overall Cognitive Status: Within Functional Limits for tasks assessed                     General Comments          Shoulder Instructions      Home Living Family/patient expects to be discharged to:: Private residence Living Arrangements: Children  Available Help at Discharge: Family;Available PRN/intermittently (will be available 24 hours initially) Type of Home: House Home Access: Stairs to enter Entrance Stairs-Number of Steps: 3 Entrance Stairs-Rails: None Home Layout: One level     Bathroom Shower/Tub: Teacher, early years/pre: Standard Bathroom Accessibility: Yes How Accessible: Accessible via walker Home Equipment: [place] - 2 wheels;Bedside commode          Prior Functioning/Environment Level of Independence: Independent             OT Diagnosis: Acute pain   OT Problem List: Decreased strength;Decreased range of motion;Decreased  activity tolerance;Pain;Decreased knowledge of precautions;Decreased knowledge of use of DME or AE   OT Treatment/Interventions: Self-care/ADL training;Therapeutic exercise;DME and/or AE instruction;Therapeutic activities;Patient/family education;Balance training    OT Goals(Current goals can be found in the care plan section) Acute Rehab OT Goals Patient Stated Goal: not stated OT Goal Formulation: With patient Time For Goal Achievement: 01/31/15 Potential to Achieve Goals: Good ADL Goals Pt Will Perform Lower Body Dressing: with modified independence;sit to/from stand Pt Will Transfer to Toilet: with modified independence;ambulating (3 in 1 over commode) Pt Will Perform Tub/Shower Transfer: Tub transfer;with supervision;ambulating;rolling walker;3 in 1  OT Frequency: Min 2X/week   Barriers to D/C:            Co-evaluation              End of Session Equipment Utilized During Treatment: Gait belt;Rolling walker;Left knee immobilizer CPM Left Knee CPM Left Knee: Off  Activity Tolerance: Patient limited by pain Patient left: in bed;with call bell/phone within reach;with family/visitor present   Time: 5974-1638 OT Time Calculation (min): 15 min Charges:  OT General Charges $OT Visit: 1 Procedure OT Evaluation $Initial OT Evaluation Tier I: 1 Procedure G-CodesBenito Mccreedy OTR/L [phone] 01/24/2015, 3:28 PM

## 2015-01-24 NOTE — Progress Notes (Signed)
Physical Therapy Treatment Patient Details Name: Morgan Taylor MRN: [id number] DOB: 1961/06/09 Today's Date: 01/24/2015    History of Present Illness Patient is a 54 yo female admitted 01/23/15 now s/p Lt TKA.  PMH:  OA, anxiety, depression, HTN, DM, hypothyroidism, Rt TKA 07/2014    PT Comments    Pt. Seems hesitant to progress her gait.  Will need ongoing encouragement.  L knee has significant extensor lag of ~15 degrees.  Worked extensively with pt. On positioning and exercises to reduce lasck of extension.  She says she had same trouble with right TKA last fall, but now can fully extend.  Will continue to work to gain full extension.  Follow Up Recommendations  Home health PT;Supervision/Assistance - 24 hour     Equipment Recommendations  None recommended by PT    Recommendations for Other Services       Precautions / Restrictions Precautions Precautions: Knee Precaution Comments: Reviewed and emphasized critical importance of working to improve her current knee extension with positioning and exercises Required Braces or Orthoses: Knee Immobilizer - Left Knee Immobilizer - Left: On when out of bed or walking Restrictions Weight Bearing Restrictions: Yes LLE Weight Bearing: Weight bearing as tolerated    Mobility  Bed Mobility Overal bed mobility: Needs Assistance Bed Mobility: Supine to Sit     Supine to sit: Supervision     General bed mobility comments: supervision for safety  Transfers Overall transfer level: Needs assistance Equipment used: Rolling walker (2 wheeled) Transfers: Sit to/from Stand Sit to Stand: Min guard         General transfer comment: min guard assist for safety; reinforcement of technique  Ambulation/Gait Ambulation/Gait assistance: Min guard Ambulation Distance (Feet): 40 Feet Assistive device: Rolling walker (2 wheeled) Gait Pattern/deviations: Step-to pattern;Decreased step length - right;Decreased step length - left;Decreased  stance time - left Gait velocity: decreased   General Gait Details: Pt. needs instruction to activate quads at heel strike; min guard assist for safety   Stairs            Wheelchair Mobility    Modified Rankin (Stroke Patients Only)       Balance                                    Cognition Arousal/Alertness: Awake/alert Behavior During Therapy: WFL for tasks assessed/performed Overall Cognitive Status: Within Functional Limits for tasks assessed                      Exercises Total Joint Exercises Ankle Circles/Pumps: AROM;Both;10 reps;Supine Quad Sets: AROM;Left;10 reps;Supine Short Arc Quad: AROM;Left;10 reps;Supine Hip ABduction/ADduction: AROM;Left;10 reps;Supine Straight Leg Raises: AROM;Left;10 reps;Supine (15 degree extensor lag noted) Knee Flexion: AROM;AAROM;Left;5 reps;Seated Goniometric ROM: -15 to 80    General Comments        Pertinent Vitals/Pain Pain Assessment: 0-10 Pain Score: 9  Pain Location: left knee Pain Descriptors / Indicators: Aching;Sore Pain Intervention(s): Limited activity within patient's tolerance;Monitored during session;Repositioned;Ice applied    Home Living                      Prior Function            PT Goals (current goals can now be found in the care plan section) Progress towards PT goals: Progressing toward goals    Frequency  7X/week    PT Plan Current plan remains  appropriate    Co-evaluation             End of Session Equipment Utilized During Treatment: Gait belt;Left knee immobilizer Activity Tolerance: Patient tolerated treatment well Patient left: in chair;with call bell/phone within reach;with family/visitor present     Time: 2820-6015 PT Time Calculation (min) (ACUTE ONLY): 30 min  Charges:  $Gait Training: 8-22 mins $Therapeutic Exercise: 8-22 mins                    G Codes:      Morgan Taylor 01/24/2015, 1:00 PM Gerlean Ren  PT Acute Rehab Services [place] [phone]

## 2015-01-24 NOTE — Op Note (Signed)
NAMEMERY, GUADALUPE NO.:  [id number]  MEDICAL RECORD NO.:  [id number]  LOCATION:  [id number]                        FACILITY:  [facility]  PHYSICIAN:  Lockie Pares, M.D.    DATE OF BIRTH:  [birth year]  DATE OF PROCEDURE:  01/23/2015 DATE OF DISCHARGE:                              OPERATIVE REPORT   PREOPERATIVE DIAGNOSIS:  Severe osteoarthritis with varus deformity, left knee.  POSTOPERATIVE DIAGNOSIS:  Severe osteoarthritis with varus deformity, left knee.  OPERATION:  Left total knee replacement (De[employer] cemented knee with 1 g of gentamicin per batch of cement, size 3 femur, size 2.5 tibia, 10 mm bearing, and 35 mm all poly patella).  ANESTHESIA:  General with nerve block.  TOURNIQUET TIME:  1 hour.  DESCRIPTION OF PROCEDURE:  Sterile prep and drape, exsanguination leg, inflation to 350.  Straight skin incision with medial parapatellar approach to the knee made.  We cut 11 mm off the distal femur at 5 degree valgus cut, followed by about 3 to 4 mm below the most diseased medial compartment.  Extension gap was measured at 10 mm.  After extensive release of the medial side of the knee and then trimming of osteophytes in the medial compartment, we then sized the femur to be size 3 femur followed by placement of the anterior posterior chamfer cut, got referencing off the anterior femur.  Cut was accomplished without difficulty.  PCL was released.  Excess menisci were trimmed and the posterior aspect of the knee was stripped as well as release of the PCL.  We placed the tibial base plate trial after keel was cut for the tibia size 2.5, followed by checking the extension and flexion gap at 10 mm.  Box cut was cut for the femur followed by trial femur with a size 3 femur as on the opposite side.  We then placed a trial.  Full extension was noted, good stability with flexion, no tendency for bearing instability, and good balance.  Patella was cut, leaving about 18  mm native patella for 35 mm all poly patella.  Trial again was accomplished.  Excellent range of motion, stability, full extension was noted.  Trials were removed.  Bony surfaces were irrigated.  We then placed the components with the antibiotic impregnated cement due to history of diabetes with a tibia followed by femur and patella.  Again, we elected to use a trial bearing on the final components.  Cement was allowed to harden.  Trial bearing was removed.  No excessive cement was noted at the posterior exit of the knee.  Tourniquet was released.  No excessive bleeding was noted.  Small bleeders were coagulated.  Final tibial 10 mm bearing was placed, matching the femur.  Closure was then affected with #1 Ethibond, 0 and 2-0 Vicryl, and staples on the skin.  Hemovac drain was adequately placed superolaterally.  The patient did get perioperative tranexamic acid as well.     Lockie Pares, M.D.     WDC/MEDQ  D:  01/23/2015  T:  01/24/2015  Job:  [id number]

## 2015-01-24 NOTE — Progress Notes (Signed)
MD, patient would like the following medication added to her MAR. Home med. -  Hydroxyzine HCL 25 mg tab, three times per day or PRN.

## 2015-01-24 NOTE — Progress Notes (Signed)
Physical Therapy Treatment Patient Details Name: Morgan Taylor MRN: [id number] DOB: 1961/11/04 Today's Date: 01/24/2015    History of Present Illness Patient is a 54 yo female admitted 01/23/15 now s/p Lt TKA.  PMH:  OA, anxiety, depression, HTN, DM, hypothyroidism, Rt TKA 07/2014    PT Comments    Worked primarily on increasing knee extension through exercises and gentle stretching/relaxation.  Pt. Did not want to get back OOB for walking.  Left positioned with towel roll under her ankle to passively stretch knee.  Will continue with POC tomorrow.  Encouraged pt. To get back OOB with staff assist later this afternoon.  Follow Up Recommendations  Home health PT;Supervision/Assistance - 24 hour     Equipment Recommendations  None recommended by PT    Recommendations for Other Services       Precautions / Restrictions Precautions Precautions: Knee Precaution Booklet Issued: No Precaution Comments: emphasized knee extension positioning Required Braces or Orthoses: Knee Immobilizer - Left Knee Immobilizer - Left: On when out of bed or walking Restrictions Weight Bearing Restrictions: Yes LLE Weight Bearing: Weight bearing as tolerated    Mobility  Bed Mobility Overal bed mobility:  (not tested; pt. back in bed) Bed Mobility: Sit to Supine;Supine to Sit     Supine to sit: Supervision Sit to supine: Supervision   General bed mobility comments: supervision for safety  Transfers Overall transfer level: Needs assistance Equipment used: Rolling walker (2 wheeled) Transfers: Sit to/from Stand Sit to Stand: Min guard         General transfer comment: cues for hand placement/technique.  Ambulation/Gait Ambulation/Gait assistance: Min guard Ambulation Distance (Feet): 40 Feet Assistive device: Rolling walker (2 wheeled) Gait Pattern/deviations: Step-to pattern;Decreased step length - right;Decreased step length - left;Decreased stance time - left Gait velocity: decreased    General Gait Details: Pt. needs instruction to activate quads at heel strike; min guard assist for safety   Stairs            Wheelchair Mobility    Modified Rankin (Stroke Patients Only)       Balance                                    Cognition Arousal/Alertness: Awake/alert Behavior During Therapy: WFL for tasks assessed/performed Overall Cognitive Status: Within Functional Limits for tasks assessed                      Exercises Total Joint Exercises Ankle Circles/Pumps: AROM;Both;10 reps;Supine Quad Sets: AROM;Left;10 reps;Supine Short Arc Quad: AROM;Left;10 reps;Supine Hip ABduction/ADduction: AROM;Left;10 reps;Supine Straight Leg Raises: AROM;Left;10 reps;Supine Knee Flexion: AROM;AAROM;Left;5 reps;Seated Goniometric ROM: -15 to 80    General Comments        Pertinent Vitals/Pain Pain Assessment: 0-10 Pain Score: 10-Worst pain ever Pain Location: left knee Pain Descriptors / Indicators: Aching Pain Intervention(s): Limited activity within patient's tolerance;Monitored during session;Premedicated before session;Ice applied    Home Living Family/patient expects to be discharged to:: Private residence Living Arrangements: Children Available Help at Discharge: Family;Available PRN/intermittently (will be available 24 hours initially) Type of Home: House Home Access: Stairs to enter Entrance Stairs-Rails: None Home Layout: One level Home Equipment: [place] - 2 wheels;Bedside commode      Prior Function Level of Independence: Independent          PT Goals (current goals can now be found in the care plan section) Acute Rehab PT  Goals Patient Stated Goal: not stated Progress towards PT goals: Progressing toward goals    Frequency  7X/week    PT Plan Current plan remains appropriate    Co-evaluation             End of Session Equipment Utilized During Treatment: Gait belt;Left knee immobilizer Activity Tolerance:  Patient limited by pain Patient left: in chair;with call bell/phone within reach;with family/visitor present     Time: 8309-4076 PT Time Calculation (min) (ACUTE ONLY): 14 min  Charges:  $Gait Training: 8-22 mins $Therapeutic Exercise: 8-22 mins                    G Codes:      Ladona Ridgel 01/24/2015, 3:54 PM Gerlean Ren PT Acute Rehab Services [place] [phone]

## 2015-01-24 NOTE — Progress Notes (Signed)
SPORTS MEDICINE AND JOINT REPLACEMENT  Lara Mulch, MD   Carlynn Spry, PA-C [place], [place], [place]  [address]                             [phone]   PROGRESS NOTE  Subjective:  negative for Chest Pain  negative for Shortness of Breath  negative for Nausea/Vomiting   negative for Calf Pain  negative for Bowel Movement   Tolerating Diet: yes         Patient reports pain as 8 on 0-10 scale.    Objective: Vital signs in last 24 hours:   Patient Vitals for the past 24 hrs:  BP Temp Temp src Pulse Resp SpO2  01/24/15 0442 (!) 112/58 mmHg 98.3 F (36.8 C) Oral 66 16 98 %  01/24/15 0225 (!) 103/49 mmHg 98.5 F (36.9 C) Oral 61 16 93 %  01/24/15 0000 - - - - 18 -  01/23/15 2038 (!) 116/54 mmHg 98.1 F (36.7 C) Oral 65 16 96 %  01/23/15 2000 - - - - 18 -  01/23/15 1417 112/61 mmHg 97.4 F (36.3 C) - 68 16 97 %  01/23/15 1313 - 97.5 F (36.4 C) - - - -  01/23/15 1301 (!) 99/56 mmHg - - 61 13 97 %  01/23/15 1246 (!) 100/57 mmHg - - 69 18 98 %  01/23/15 1231 108/64 mmHg - - 62 17 98 %  01/23/15 1216 113/66 mmHg - - 61 14 96 %  01/23/15 1201 122/76 mmHg - - 75 15 99 %  01/23/15 1146 115/76 mmHg - - 74 (!) 24 99 %  01/23/15 1131 107/60 mmHg - - 60 (!) 7 99 %  01/23/15 1116 110/64 mmHg - - (!) 58 12 99 %  01/23/15 1115 - 97.2 F (36.2 C) - - - -  01/23/15 1101 118/80 mmHg - - 72 18 98 %  01/23/15 1045 121/69 mmHg - - 70 15 99 %  01/23/15 1030 115/71 mmHg - - 66 13 98 %  01/23/15 1016 116/64 mmHg - - 69 14 100 %    @flow {1959:LAST@   Intake/Output from previous day:   01/29 0701 - 01/30 0700 In: 0932 [P.O.:1220; I.V.:2250] Out: 650 [Drains:400]   Intake/Output this shift:   01/30 0701 - 01/30 1900 In: 240 [P.O.:240] Out: -    Intake/Output      01/29 0701 - 01/30 0700 01/30 0701 - 01/31 0700   P.O. 1220 240   I.V. (mL/kg) 2250 (28.7)    Total Intake(mL/kg) 3470 (44.2) 240 (3.1)   Urine (mL/kg/hr) 0 (0)    Drains 400 (0.2)    Blood 250 (0.1)     Total Output 650     Net +2820 +240        Urine Occurrence 11 x 1 x      LABORATORY DATA:  Recent Labs  01/24/15 0548  WBC 9.3  HGB 9.4*  HCT 29.6*  PLT 243    Recent Labs  01/24/15 0548  NA 132*  K 3.6  CL 101  CO2 25  BUN 9  CREATININE 0.78  GLUCOSE 176*  CALCIUM 8.6   Lab Results  Component Value Date   INR 1.07 01/09/2015   INR 1.06 08/07/2014    Examination:  General appearance: alert, cooperative and no distress Extremities: extremities normal, atraumatic, no cyanosis or edema and Homans sign is negative, no sign of DVT  Wound Exam: clean, dry, intact   Drainage:  Scant/small amount Serosanguinous exudate  Motor Exam: EHL and FHL Intact  Sensory Exam: Deep Peroneal normal   Assessment:    1 Day Post-Op  Procedure(s) (LRB): TOTAL KNEE ARTHROPLASTY (Left)  ADDITIONAL DIAGNOSIS:  Active Problems:   Osteoarthritis of left knee  Acute Blood Loss Anemia   Plan: Physical Therapy as ordered Weight Bearing as Tolerated (WBAT)  DVT Prophylaxis:  Xarelto  DISCHARGE PLAN: Home  DISCHARGE NEEDS: HHPT, CPM, Walker and 3-in-1 comode seat         Morgan Taylor 01/24/2015, 10:14 AM

## 2015-01-24 NOTE — Discharge Instructions (Signed)
Diet: As you were doing prior to hospitalization   Activity: Increase activity slowly as tolerated  No lifting or driving for 6 weeks   Shower: May shower without a dressing on post op day #5, NO SOAKING in tub   Dressing: You may change your dressing on post op day #3.  Then change the dressing daily with sterile 4"x4"s gauze dressing  And TED hose for knees. Use paper tape to hold dressing in place  For hips. You may clean the incision with alcohol prior to redressing.   Weight Bearing: weight bearing as taught in physical therapy. Use a walker or  Crutches as instructed.   To prevent constipation: you may use a stool softener such as -  Colace ( over the counter) 100 mg by mouth twice a day  Drink plenty of fluids ( prune juice may be helpful) and high fiber foods  Miralax ( over the counter) for constipation as needed.   Precautions: If you experience chest pain or shortness of breath - call 911 immediately For transfer to the hospital emergency department!!  If you develop a fever greater that 101 F, purulent drainage from wound, increased redness or drainage from wound, or calf pain -- Call the office   Follow- Up Appointment: Please call for an appointment to be seen in 2 weeks  [facility] - [phone]   Information on my medicine - XARELTO (Rivaroxaban)  This medication education was reviewed with me or my healthcare representative as part of my discharge preparation.  The pharmacist that spoke with me during my hospital stay was:  Jaquita Folds, [facility]  Why was Xarelto prescribed for you? Xarelto was prescribed for you to reduce the risk of blood clots forming after orthopedic surgery. The medical term for these abnormal blood clots is venous thromboembolism (VTE).  What do you need to know about xarelto ? Take your Xarelto ONCE DAILY at the same time every day. You may take it either with or without food.  If you have difficulty swallowing the tablet whole, you  may crush it and mix in applesauce just prior to taking your dose.  Take Xarelto exactly as prescribed by your doctor and DO NOT stop taking Xarelto without talking to the doctor who prescribed the medication.  Stopping without other VTE prevention medication to take the place of Xarelto may increase your risk of developing a clot.  After discharge, you should have regular check-up appointments with your healthcare provider that is prescribing your Xarelto.    What do you do if you miss a dose? If you miss a dose, take it as soon as you remember on the same day then continue your regularly scheduled once daily regimen the next day. Do not take two doses of Xarelto on the same day.   Important Safety Information A possible side effect of Xarelto is bleeding. You should call your healthcare provider right away if you experience any of the following: ? Bleeding from an injury or your nose that does not stop. ? Unusual colored urine (red or dark brown) or unusual colored stools (red or black). ? Unusual bruising for unknown reasons. ? A serious fall or if you hit your head (even if there is no bleeding).  Some medicines may interact with Xarelto and might increase your risk of bleeding while on Xarelto. To help avoid this, consult your healthcare provider or pharmacist prior to using any new prescription or non-prescription medications, including herbals, vitamins, non-steroidal anti-inflammatory drugs (NSAIDs) and  supplements.  This website has more information on Xarelto: [email or web address]

## 2015-01-25 LAB — CBC
HCT: 28.1 % — ABNORMAL LOW (ref 36.0–46.0)
Hemoglobin: 9 g/dL — ABNORMAL LOW (ref 12.0–15.0)
MCH: 29.4 pg (ref 26.0–34.0)
MCHC: 32 g/dL (ref 30.0–36.0)
MCV: 91.8 fL (ref 78.0–100.0)
Platelets: 249 10*3/uL (ref 150–400)
RBC: 3.06 MIL/uL — ABNORMAL LOW (ref 3.87–5.11)
RDW: 14.3 % (ref 11.5–15.5)
WBC: 9.3 10*3/uL (ref 4.0–10.5)

## 2015-01-25 LAB — GLUCOSE, CAPILLARY
Glucose-Capillary: 199 mg/dL — ABNORMAL HIGH (ref 70–99)
Glucose-Capillary: 157 mg/dL — ABNORMAL HIGH (ref 70–99)

## 2015-01-25 NOTE — Progress Notes (Signed)
SPORTS MEDICINE AND JOINT REPLACEMENT  Morgan Mulch, MD   Morgan Spry, PA-C [place], [place], [place]  [address]                             [phone]   PROGRESS NOTE  Subjective:  negative for Chest Pain  negative for Shortness of Breath  negative for Nausea/Vomiting   negative for Calf Pain  negative for Bowel Movement   Tolerating Diet: yes         Patient reports pain as 5 on 0-10 scale.    Objective: Vital signs in last 24 hours:   Patient Vitals for the past 24 hrs:  BP Temp Temp src Pulse Resp SpO2  01/25/15 0800 - - - - 16 -  01/25/15 0640 130/65 mmHg (!) 100.4 F (38 C) Oral 93 16 94 %  01/24/15 2115 120/66 mmHg 98.4 F (36.9 C) Oral 72 16 96 %    @flow {1959:LAST@   Intake/Output from previous day:   01/30 0701 - 01/31 0700 In: 720 [P.O.:720] Out: 150 [Drains:150]   Intake/Output this shift:       Intake/Output      01/30 0701 - 01/31 0700 01/31 0701 - 02/01 0700   P.O. 720    I.V. (mL/kg)     Total Intake(mL/kg) 720 (9.2)    Urine (mL/kg/hr) 0 (0)    Drains 150 (0.1)    Blood     Total Output 150     Net +570          Urine Occurrence 4 x       LABORATORY DATA:  Recent Labs  01/24/15 0548 01/25/15 0410  WBC 9.3 9.3  HGB 9.4* 9.0*  HCT 29.6* 28.1*  PLT 243 249    Recent Labs  01/24/15 0548  NA 132*  K 3.6  CL 101  CO2 25  BUN 9  CREATININE 0.78  GLUCOSE 176*  CALCIUM 8.6   Lab Results  Component Value Date   INR 1.07 01/09/2015   INR 1.06 08/07/2014    Examination:  General appearance: alert, cooperative and no distress Extremities: Homans sign is negative, no sign of DVT  Wound Exam: clean, dry, intact   Drainage:  None: wound tissue dry  Motor Exam: EHL and FHL Intact  Sensory Exam: Deep Peroneal normal   Assessment:    2 Days Post-Op  Procedure(s) (LRB): TOTAL KNEE ARTHROPLASTY (Left)  ADDITIONAL DIAGNOSIS:  Active Problems:   Osteoarthritis of left knee  Acute Blood Loss  Anemia   Plan: Physical Therapy as ordered Weight Bearing as Tolerated (WBAT)  DVT Prophylaxis:  Xarelto  DISCHARGE PLAN: Home  DISCHARGE NEEDS: HHPT, CPM, Walker and 3-in-1 comode seat         Morgan Taylor 01/25/2015, 9:56 AM

## 2015-01-25 NOTE — Progress Notes (Signed)
Physical Therapy Treatment Patient Details Name: Morgan Taylor MRN: [id number] DOB: [birth year] Today's Date: 01/25/2015    History of Present Illness Patient is a 54 yo female admitted 01/23/15 now s/p Lt TKA.  PMH:  OA, anxiety, depression, HTN, DM, hypothyroidism, Rt TKA 07/2014    PT Comments    Pt continues to be greatly limited by pain and self limiting with therapy. Pt c/o nausea with mobility. Encouraged OOB with nursing as tolerated today and reviewed HEP and encouraged independent HEP as tolerated. Will cont to follow per POC.   Follow Up Recommendations  Home health PT;Supervision/Assistance - 24 hour     Equipment Recommendations  None recommended by PT    Recommendations for Other Services       Precautions / Restrictions Precautions Precautions: Knee Precaution Comments: reinforced no pillow under knee  Required Braces or Orthoses: Knee Immobilizer - Left Knee Immobilizer - Left: On when out of bed or walking Restrictions Weight Bearing Restrictions: Yes LLE Weight Bearing: Weight bearing as tolerated    Mobility  Bed Mobility Overal bed mobility: Needs Assistance Bed Mobility: Supine to Sit     Supine to sit: Supervision     General bed mobility comments: cues for sequencing and encouragement to attempt independently; pt relying on handrail  Transfers Overall transfer level: Needs assistance Equipment used: Rolling walker (2 wheeled) Transfers: Sit to/from Stand Sit to Stand: Min guard         General transfer comment: min guard to steady; cues for hand placement   Ambulation/Gait Ambulation/Gait assistance: Min guard Ambulation Distance (Feet): 24 Feet (<> bathroom)   Gait Pattern/deviations: Step-to pattern;Decreased stance time - left;Decreased step length - right;Antalgic;Trunk flexed Gait velocity: decreased Gait velocity interpretation: Below normal speed for age/gender General Gait Details: cues for step through gt sequencing and upright  posture; pt unsteady with directional changes; limited by pain; pt with difficulty performing heel strike due to pain    Stairs            Wheelchair Mobility    Modified Rankin (Stroke Patients Only)       Balance Overall balance assessment: Needs assistance Sitting-balance support: Feet supported;No upper extremity supported Sitting balance-Leahy Scale: Fair     Standing balance support: During functional activity;Bilateral upper extremity supported;Single extremity supported Standing balance-Leahy Scale: Poor Standing balance comment: UE or bracing at all times to maintain balance; min guard when performing ADLs at toilet and sink                     Cognition Arousal/Alertness: Awake/alert Behavior During Therapy: WFL for tasks assessed/performed Overall Cognitive Status: Within Functional Limits for tasks assessed                      Exercises Total Joint Exercises Ankle Circles/Pumps: AROM;Both;10 reps;Supine Quad Sets: AROM;Left;10 reps;Seated Short Arc Quad: 5 reps;AROM;AAROM;Strengthening;Left;Limitations Short Arc Quad Limitations: difficulty firing quads Hip ABduction/ADduction: AROM;Left;10 reps Long Arc Quad: AROM;Left;10 reps;Seated Goniometric ROM: -10 to 90 degrees     General Comments        Pertinent Vitals/Pain Pain Assessment: 0-10 Pain Score: 8  Pain Location: Lt knee Pain Descriptors / Indicators: Aching Pain Intervention(s): Limited activity within patient's tolerance;Monitored during session;Premedicated before session;Repositioned    Home Living                      Prior Function  PT Goals (current goals can now be found in the care plan section) Acute Rehab PT Goals Patient Stated Goal: to not feel so bad PT Goal Formulation: With patient Time For Goal Achievement: 01/30/15 Potential to Achieve Goals: Good Progress towards PT goals: Progressing toward goals    Frequency  7X/week     PT Plan Current plan remains appropriate    Co-evaluation             End of Session Equipment Utilized During Treatment: Gait belt;Left knee immobilizer Activity Tolerance: Patient limited by pain Patient left: in chair;with call bell/phone within reach     Time: 0835-0900 PT Time Calculation (min) (ACUTE ONLY): 25 min  Charges:  $Gait Training: 8-22 mins $Therapeutic Exercise: 8-22 mins                    G CodesElie Confer [place], [place]  [phone] 01/25/2015, 10:00 AM

## 2015-01-25 NOTE — Progress Notes (Signed)
Patient states she has 3 in 1 and a walker at home and does not need any additional equipment for home other than the CPM, which she states is being delivered.

## 2015-01-26 ENCOUNTER — Encounter (HOSPITAL_COMMUNITY): Payer: Self-pay | Admitting: Orthopedic Surgery

## 2015-02-05 NOTE — Discharge Summary (Signed)
PATIENT ID: Morgan Taylor        MRN:  [id number]          DOB/AGE: 05-01-1961 / 54 y.o.    DISCHARGE SUMMARY  ADMISSION DATE:    01/23/2015 DISCHARGE DATE:   01/25/2015  ADMISSION DIAGNOSIS: LEFT KNEE OA    DISCHARGE DIAGNOSIS:  LEFT KNEE OA    ADDITIONAL DIAGNOSIS: Active Problems:   Osteoarthritis of left knee  Past Medical History  Diagnosis Date  . Diabetes mellitus   . Hypertension   . Arthritis   . Family history of anesthesia complication     Patients son has difficulty waking up after anesthesia  . Hypothyroidism   . Anxiety   . Depression   . Cancer 2009    lumpectomy  . Hyperlipemia   . GERD (gastroesophageal reflux disease)     PROCEDURE: Procedure(s): TOTAL KNEE ARTHROPLASTY Left on 01/23/2015  CONSULTS: none     HISTORY:  See H&P in chart  HOSPITAL COURSE:  Morgan Taylor is a 54 y.o. admitted on 01/23/2015 and found to have a diagnosis of LEFT KNEE OA.  After appropriate laboratory studies were obtained  they were taken to the operating room on 01/23/2015 and underwent  Procedure(s): TOTAL KNEE ARTHROPLASTY  Left.   They were given perioperative antibiotics:  Anti-infectives    Start     Dose/Rate Route Frequency Ordered Stop   01/23/15 1500  ceFAZolin (ANCEF) IVPB 1 g/50 mL premix     1 g 100 mL/hr over 30 Minutes Intravenous Every 6 hours 01/23/15 1403 01/23/15 2106   01/23/15 0600  ceFAZolin (ANCEF) IVPB 2 g/50 mL premix     2 g 100 mL/hr over 30 Minutes Intravenous On call to O.R. 01/22/15 1416 01/23/15 0750    .  Tolerated the procedure well.   POD #1, allowed out of bed to a chair.  PT for ambulation and exercise program.    IV saline locked.  O2 discontionued.  POD #2, continued PT and ambulation.   Hemovac pulled. .  The remainder of the hospital course was dedicated to ambulation and strengthening.   The patient was discharged on 2 days post op in  Stable condition.  Blood products given:none  DIAGNOSTIC STUDIES: Recent vital  signs: No data found.      Recent laboratory studies: No results for input(s): WBC, HGB, HCT, PLT in the last 168 hours. No results for input(s): NA, K, CL, CO2, BUN, CREATININE, GLUCOSE, CALCIUM in the last 168 hours. Lab Results  Component Value Date   INR 1.07 01/09/2015   INR 1.06 08/07/2014     Recent Radiographic Studies :  No results found.  DISCHARGE INSTRUCTIONS:   DISCHARGE MEDICATIONS:     Medication List    TAKE these medications        amLODipine 10 MG tablet  Commonly known as:  NORVASC  Take 10 mg by mouth every morning.     atorvastatin 20 MG tablet  Commonly known as:  LIPITOR  Take 20 mg by mouth every morning.     buPROPion 100 MG tablet  Commonly known as:  WELLBUTRIN  Take 300 mg by mouth daily.     FLUoxetine 20 MG tablet  Commonly known as:  PROZAC  Take 40 mg by mouth daily.     insulin glargine 100 UNIT/ML injection  Commonly known as:  LANTUS  Inject 24 Units into the skin at bedtime.     losartan 100 MG  tablet  Commonly known as:  COZAAR  Take 100 mg by mouth every morning.     metoprolol succinate 50 MG 24 hr tablet  Commonly known as:  TOPROL-XL  Take 50 mg by mouth every morning. Take with or immediately following a meal.     omeprazole 20 MG capsule  Commonly known as:  PRILOSEC  Take 20 mg by mouth every morning.     Oxycodone HCl 10 MG Tabs  Take 10 mg by mouth every 6 (six) hours. Dosage to be increased 01/22/14 to 15 mg     pioglitazone 15 MG tablet  Commonly known as:  ACTOS  Take 15 mg by mouth every morning.     rivaroxaban 10 MG Tabs tablet  Commonly known as:  XARELTO  Take 1 tablet (10 mg total) by mouth daily.     topiramate 100 MG tablet  Commonly known as:  TOPAMAX  Take 100 mg by mouth at bedtime.     VICTOZA 18 MG/3ML Sopn  Generic drug:  Liraglutide  Inject 1.8 mg into the skin daily.        FOLLOW UP VISIT:       Follow-up Information    Follow up with CAFFREY JR,W D, MD. Schedule an  appointment as soon as possible for a visit in 2 weeks.   Specialty:  Orthopedic Surgery   Contact information:   [place] [address] [phone]       DISPOSITION:   Home  CONDITION:  Stable   Chriss Czar, PA-C  02/05/2015 1:30 PM

## 2015-02-12 ENCOUNTER — Ambulatory Visit: Payer: Medicare Other | Attending: Orthopedic Surgery | Admitting: Physical Therapy

## 2015-02-12 ENCOUNTER — Encounter: Payer: Self-pay | Admitting: Physical Therapy

## 2015-02-12 DIAGNOSIS — Z4789 Encounter for other orthopedic aftercare: Secondary | ICD-10-CM | POA: Diagnosis present

## 2015-02-12 DIAGNOSIS — Z96652 Presence of left artificial knee joint: Secondary | ICD-10-CM | POA: Insufficient documentation

## 2015-02-12 DIAGNOSIS — M25562 Pain in left knee: Secondary | ICD-10-CM | POA: Insufficient documentation

## 2015-02-12 DIAGNOSIS — M25662 Stiffness of left knee, not elsewhere classified: Secondary | ICD-10-CM | POA: Diagnosis not present

## 2015-02-12 NOTE — Therapy (Signed)
[place], [place], [address] Phone: [phone]   Fax:  [phone]  Physical Therapy Evaluation  Patient Details  Name: Morgan Taylor MRN: [id number] Date of Birth: 11/20/1961 Referring Provider:  Yvette Rack., MD  Encounter Date: 02/12/2015      PT End of Session - 02/12/15 0943    Visit Number 1   Date for PT Re-Evaluation 04/13/15   Authorization Type Medicare/medicaid   PT Start Time 9767   Activity Tolerance Patient limited by pain      Past Medical History  Diagnosis Date  . Diabetes mellitus   . Hypertension   . Arthritis   . Family history of anesthesia complication     Patients son has difficulty waking up after anesthesia  . Hypothyroidism   . Anxiety   . Depression   . Cancer 2009    lumpectomy  . Hyperlipemia   . GERD (gastroesophageal reflux disease)     Past Surgical History  Procedure Laterality Date  . Appendectomy    . Shoulder arthroscopy Left     cleaned out joint  . Cesarean section    . Dilation and curettage of uterus    . Breast lumpectomy Left   . Colonoscopy    . Total knee arthroplasty Right 08/15/2014    Procedure: RIGHT TOTAL KNEE ARTHROPLASTY;  Surgeon: Yvette Rack., MD;  Location: [facility];  Service: Orthopedics;  Laterality: Right;  . Total knee arthroplasty Left 01/23/2015    Procedure: TOTAL KNEE ARTHROPLASTY;  Surgeon: Yvette Rack., MD;  Location: [facility];  Service: Orthopedics;  Laterality: Left;    There were no vitals taken for this visit.  Visit Diagnosis:  Stiffness of knee joint, left - Plan: PT plan of care cert/re-cert  Knee pain, acute, left - Plan: PT plan of care cert/re-cert      Subjective Assessment - 02/12/15 0933    Symptoms Left TKR on 01/23/15, left knee pain and stiffness   Pertinent History left knee pain for about 5 years prior to the TKR, right TKR August 2015   Limitations Standing;Walking;House hold  activities   How long can you sit comfortably? 10 minutes   How long can you stand comfortably? 5 minutes   How long can you walk comfortably? 5 minutes   Currently in Pain? Yes   Pain Score 10-Worst pain ever   Pain Location Knee   Pain Orientation Left   Pain Descriptors / Indicators Aching;Constant;Tightness;Throbbing   Pain Type Surgical pain   Pain Onset 1 to 4 weeks ago   Pain Frequency Constant   Aggravating Factors  cold weather, standing, walking and bending   Pain Relieving Factors rest and pain meds   Effect of Pain on Daily Activities severly limits          [facility] PT Assessment - 02/12/15 0001    Assessment   Medical Diagnosis S/P left TKR   Onset Date 01/23/15   Next MD Visit 02/28/15   Prior Therapy home PT 6 visits   Restrictions   Weight Bearing Restrictions No   Balance Screen   Has the patient fallen in the past 6 months No   Has the patient had a decrease in activity level because of a fear of falling?  No   Is the patient reluctant to leave their home because of a fear of falling?  No   Home Environment   Living  Enviornment Private residence   Living Arrangements --  has 2 younger kids at her house that she cares for   Type of Home --  3 steps into the home   Prior Function   Level of Independence Independent with homemaking with ambulation   Vocation Unemployed   Leisure did not exercise   AROM   Left Knee Extension 30   Left Knee Flexion 81   PROM   Overall PROM Comments 15-91 degrees flexion   Palpation   Palpation Scar is healed, tight and tender especially distally   Ambulation/Gait   Gait Comments Ambulates with walker some, today no assistive device, antalgic on the left, minimal knee motions, very stiff leg gait       AROM of the left knee at edge of bed was 30-81 degrees flexion                     PT Short Term Goals - 2015/02/16 0946    PT SHORT TERM GOAL #1   Title independent with HEP   Time 4   Period Weeks    Status New           PT Long Term Goals - February 16, 2015 0947    PT LONG TERM GOAL #1   Title decrease pain 50%   Time 8   Period Weeks   Status New   PT LONG TERM GOAL #2   Title increase ROM to 10-110 degrees flexion   Time 8   Period Weeks   Status New   PT LONG TERM GOAL #3   Title independent with HEP   Time 8   Period Weeks   Status New   PT LONG TERM GOAL #4   Title independent with RICE   Time [place] - 02/16/15 0944    Clinical Impression Statement Patient underwent a left TKR on 01/23/15.  She had a 3 day hospital stay and then had home PT, she had the right knee replaced in August 2015.  She lives with spouse and 2 grandchildren, 3 steps into the home, she has a very stiff left knee and hrates her pain a 10/10 at most times   Pt will benefit from skilled therapeutic intervention in order to improve on the following deficits Abnormal gait;Decreased activity tolerance;Decreased balance;Decreased mobility;Decreased endurance;Decreased range of motion;Decreased scar mobility;Decreased strength;Increased edema;Difficulty walking;Impaired flexibility   Rehab Potential Good   PT Frequency 3x / week   PT Duration 8 weeks   PT Treatment/Interventions Cryotherapy;Electrical Stimulation;Functional mobility training;Stair training;Gait training;Therapeutic exercise;Balance training;Neuromuscular re-education;Manual techniques;Passive range of motion;Patient/family education   PT Next Visit Plan add exercises and modalities   PT Home Exercise Plan Currently doing [facility] PT exercises and has CPM at home   Consulted and Agree with Plan of Care Patient          G-Codes - 02-16-15 0948    Functional Assessment Tool Used FOTO   Functional Limitation Mobility: Walking and moving around   Mobility: Walking and Moving Around Current Status [phone]) At least 60 percent but less than 80 percent impaired, limited or restricted   Mobility:  Walking and Moving Around Goal Status [phone]) At least 40 percent but less than 60 percent impaired, limited or restricted       Problem List Patient Active Problem List   Diagnosis Date Noted  . Osteoarthritis  of left knee 01/23/2015  . Osteoarthritis of right knee 08/15/2014  . Cancer of lower-outer quadrant of female breast 01/23/2012  . SNORING 05/03/2007  . HYPERGLYCEMIA 05/03/2007  . PHARYNGITIS, ACUTE 02/22/2007  . HYPERPLASIA, ENDOMETRIAL NOS 02/22/2007  . DYSMENORRHEA 10/18/2006  . DEGENERATIVE JOINT DISEASE 10/18/2006  . INSOMNIA 10/18/2006  . CHEST PAIN 10/18/2006  . OBESITY 10/05/2006  . ANXIETY 10/05/2006  . HYPERTENSION 10/05/2006  . GASTROENTERITIS 10/05/2006  . VAGINAL BLEEDING 10/05/2006  . MENORRHAGIA, PERIMENOPAUSAL 10/05/2006  . PATELLO-FEMORAL SYNDROME 10/05/2006    Sumner Boast 02/12/2015, 10:15 AM  [place] [address] [place], [place], [address] Phone: [phone]   Fax:  [phone]

## 2015-02-16 ENCOUNTER — Encounter: Payer: Self-pay | Admitting: Physical Therapy

## 2015-02-16 ENCOUNTER — Ambulatory Visit: Payer: Medicare Other | Admitting: Physical Therapy

## 2015-02-16 DIAGNOSIS — M25562 Pain in left knee: Secondary | ICD-10-CM

## 2015-02-16 DIAGNOSIS — M25662 Stiffness of left knee, not elsewhere classified: Secondary | ICD-10-CM

## 2015-02-16 DIAGNOSIS — Z4789 Encounter for other orthopedic aftercare: Secondary | ICD-10-CM | POA: Diagnosis not present

## 2015-02-16 NOTE — Therapy (Signed)
[place], [place], [address] Phone: [phone]   Fax:  [phone]  Physical Therapy Treatment  Patient Details  Name: Morgan Taylor MRN: [id number] Date of Birth: 1961/07/28 Referring Provider:  Yvette Rack., MD  Encounter Date: 02/16/2015      PT End of Session - 02/16/15 1049    Visit Number 2   PT Start Time 1010   PT Stop Time 1109   PT Time Calculation (min) 59 min   Activity Tolerance Patient limited by pain      Past Medical History  Diagnosis Date  . Diabetes mellitus   . Hypertension   . Arthritis   . Family history of anesthesia complication     Patients son has difficulty waking up after anesthesia  . Hypothyroidism   . Anxiety   . Depression   . Cancer 2009    lumpectomy  . Hyperlipemia   . GERD (gastroesophageal reflux disease)     Past Surgical History  Procedure Laterality Date  . Appendectomy    . Shoulder arthroscopy Left     cleaned out joint  . Cesarean section    . Dilation and curettage of uterus    . Breast lumpectomy Left   . Colonoscopy    . Total knee arthroplasty Right 08/15/2014    Procedure: RIGHT TOTAL KNEE ARTHROPLASTY;  Surgeon: Yvette Rack., MD;  Location: [facility];  Service: Orthopedics;  Laterality: Right;  . Total knee arthroplasty Left 01/23/2015    Procedure: TOTAL KNEE ARTHROPLASTY;  Surgeon: Yvette Rack., MD;  Location: [facility];  Service: Orthopedics;  Laterality: Left;    There were no vitals taken for this visit.  Visit Diagnosis:  No diagnosis found.      Subjective Assessment - 02/16/15 1016    Symptoms Left TKR on 01/23/15, left knee pain and stiffness, reports that she slipped on stairs on Sunday and has been in more pain since   Pertinent History left knee pain for about 5 years prior to the TKR, right TKR August 2015   Limitations Standing;Walking;House hold activities   How long can you sit comfortably? 10 minutes   How long can you stand comfortably? 5 minutes   How long can you walk comfortably? 5 minutes   Currently in Pain? Yes   Pain Score 10-Worst pain ever   Pain Location Knee   Pain Orientation Left   Pain Descriptors / Indicators Aching;Sharp   Pain Type Surgical pain   Pain Onset 1 to 4 weeks ago   Pain Frequency Constant   Aggravating Factors  slipping on Sunday increase the pain   Pain Relieving Factors pain meds, ice helps                    [facility] Adult PT Treatment/Exercise - 02/16/15 0001    Knee/Hip Exercises: Stretches   Gastroc Stretch 20 seconds;3 reps   Soleus Stretch 20 seconds;3 reps   Knee/Hip Exercises: Aerobic   Stationary Bike 5 minutes   Tread Mill Nustep 6 mins, Level 4   Knee/Hip Exercises: Seated   Other Seated Knee Exercises leg extension 5# 3x10, leg flexion 25 # 3x10   Other Seated Knee Exercises leg press 20# 3x10   Cryotherapy   Number Minutes Cryotherapy 15 Minutes   Type of Cryotherapy Other (comment)  vasopneumatic   Electrical Stimulation   Electrical Stimulation Location left knee   Electrical  Stimulation Goals Pain   Manual Therapy   Manual Therapy Passive ROM   Passive ROM PROM of the left knee flexion and extension                  PT Short Term Goals - 02/12/15 0946    PT SHORT TERM GOAL #1   Title independent with HEP   Time 4   Period Weeks   Status New           PT Long Term Goals - 02/12/15 0947    PT LONG TERM GOAL #1   Title decrease pain 50%   Time 8   Period Weeks   Status New   PT LONG TERM GOAL #2   Title increase ROM to 10-110 degrees flexion   Time 8   Period Weeks   Status New   PT LONG TERM GOAL #3   Title independent with HEP   Time 8   Period Weeks   Status New   PT LONG TERM GOAL #4   Title independent with RICE   Time 8   Period Weeks   Status New               Problem List Patient Active Problem List   Diagnosis Date Noted  . Osteoarthritis of left knee  01/23/2015  . Osteoarthritis of right knee 08/15/2014  . Cancer of lower-outer quadrant of female breast 01/23/2012  . SNORING 05/03/2007  . HYPERGLYCEMIA 05/03/2007  . PHARYNGITIS, ACUTE 02/22/2007  . HYPERPLASIA, ENDOMETRIAL NOS 02/22/2007  . DYSMENORRHEA 10/18/2006  . DEGENERATIVE JOINT DISEASE 10/18/2006  . INSOMNIA 10/18/2006  . CHEST PAIN 10/18/2006  . OBESITY 10/05/2006  . ANXIETY 10/05/2006  . HYPERTENSION 10/05/2006  . GASTROENTERITIS 10/05/2006  . VAGINAL BLEEDING 10/05/2006  . MENORRHAGIA, PERIMENOPAUSAL 10/05/2006  . PATELLO-FEMORAL SYNDROME 10/05/2006    Sumner Boast, PT 02/16/2015, 10:50 AM  [place] [address] [place], [place], [address] Phone: [phone]   Fax:  [phone]

## 2015-02-19 ENCOUNTER — Ambulatory Visit: Payer: Medicare Other | Admitting: Physical Therapy

## 2015-02-19 ENCOUNTER — Encounter: Payer: Self-pay | Admitting: Physical Therapy

## 2015-02-19 DIAGNOSIS — M25662 Stiffness of left knee, not elsewhere classified: Secondary | ICD-10-CM

## 2015-02-19 DIAGNOSIS — Z4789 Encounter for other orthopedic aftercare: Secondary | ICD-10-CM | POA: Diagnosis not present

## 2015-02-19 DIAGNOSIS — M25562 Pain in left knee: Secondary | ICD-10-CM

## 2015-02-19 NOTE — Therapy (Signed)
[place], [place], [address] Phone: [phone]   Fax:  [phone]  Physical Therapy Treatment  Patient Details  Name: Morgan Taylor MRN: [id number] Date of Birth: [birth year] Referring Provider:  Yvette Rack., MD  Encounter Date: 02/19/2015      PT End of Session - 02/19/15 1021    Visit Number 3   Number of Visits 24   Date for PT Re-Evaluation 04/13/15   Authorization Type Medicare/medicaid   PT Start Time 0930   PT Stop Time 1031   PT Time Calculation (min) 61 min   Activity Tolerance Patient limited by pain   Behavior During Therapy [facility] for tasks assessed/performed      Past Medical History  Diagnosis Date  . Diabetes mellitus   . Hypertension   . Arthritis   . Family history of anesthesia complication     Patients son has difficulty waking up after anesthesia  . Hypothyroidism   . Anxiety   . Depression   . Cancer 2009    lumpectomy  . Hyperlipemia   . GERD (gastroesophageal reflux disease)     Past Surgical History  Procedure Laterality Date  . Appendectomy    . Shoulder arthroscopy Left     cleaned out joint  . Cesarean section    . Dilation and curettage of uterus    . Breast lumpectomy Left   . Colonoscopy    . Total knee arthroplasty Right 08/15/2014    Procedure: RIGHT TOTAL KNEE ARTHROPLASTY;  Surgeon: Yvette Rack., MD;  Location: [facility];  Service: Orthopedics;  Laterality: Right;  . Total knee arthroplasty Left 01/23/2015    Procedure: TOTAL KNEE ARTHROPLASTY;  Surgeon: Yvette Rack., MD;  Location: [facility];  Service: Orthopedics;  Laterality: Left;    There were no vitals taken for this visit.  Visit Diagnosis:  Left knee pain  Knee stiffness, left  Stiffness of knee joint, left  Knee pain, acute, left      Subjective Assessment - 02/19/15 0931    Symptoms I'm cold and my leg is stiff.   Currently in Pain? Yes   Pain Score 8    Pain Location  Knee   Pain Orientation Left   Pain Descriptors / Indicators Aching;Sharp   Pain Type Acute pain   Pain Onset 1 to 4 weeks ago          [facility] PT Assessment - 02/19/15 0001    ROM / Strength   AROM / PROM / Strength AROM   AROM   AROM Assessment Site Knee   Right/Left Knee Left   Left Knee Extension 8   Left Knee Flexion 93                  [facility] Adult PT Treatment/Exercise - 02/19/15 0001    Exercises   Exercises Knee/Hip   Knee/Hip Exercises: Aerobic   Stationary Bike 6 minutes   Tread Mill nustep  level 5 x 6 min   Knee/Hip Exercises: Standing   Terminal Knee Extension 2 sets;15 reps  ball vs wall   Modalities   Modalities Cryotherapy;Ret[employer] Location left knee   Electrical Stimulation Parameters IFC   Electrical Stimulation Goals Pain   Knee/Hip Exercises: Machines for Strengthening   Cybex Knee Extension 10# 2x15   Cybex Knee Flexion 25# 2x15   Cybex Leg Press 30#  2x15  position 5   Total Gym Leg Press calf press 30#  2x15                  PT Short Term Goals - 02/12/15 0946    PT SHORT TERM GOAL #1   Title independent with HEP   Time 4   Period Weeks   Status New           PT Long Term Goals - 02/19/15 1016    PT LONG TERM GOAL #1   Title decrease pain 50%   Time 8   Period Weeks   Status New   PT LONG TERM GOAL #2   Title increase ROM to 10-110 degrees flexion   Time 8   Period Weeks   Status New   PT LONG TERM GOAL #3   Title independent with HEP   Time 8   Period Weeks   Status New   PT LONG TERM GOAL #4   Title independent with RICE   Time 8   Period Weeks   Status Achieved               Plan - 02/19/15 1014    Clinical Impression Statement Antalgic gait with decreased knee flexion during gait producing circumduction of the hip.  ROM is greatly improved today after exercises.   Pt will benefit from skilled therapeutic intervention in  order to improve on the following deficits Abnormal gait;Decreased activity tolerance;Decreased balance;Decreased mobility;Decreased endurance;Decreased range of motion;Decreased scar mobility;Decreased strength;Increased edema;Difficulty walking;Impaired flexibility   Rehab Potential Good   PT Frequency 3x / week   PT Duration 8 weeks   PT Treatment/Interventions Cryotherapy;Electrical Stimulation;Functional mobility training;Stair training;Gait training;Therapeutic exercise;Balance training;Neuromuscular re-education;Manual techniques;Passive range of motion;Patient/family education   PT Next Visit Plan Continue to strengthen.  Add balance and gait.   PT Home Exercise Plan Currently doing [facility] PT exercises and has CPM at home   Consulted and Agree with Plan of Care Patient        Problem List Patient Active Problem List   Diagnosis Date Noted  . primary Osteoarthritis of left knee 01/23/2015  . Osteoarthritis of right knee 08/15/2014  . Cancer of lower-outer quadrant of female breast 01/23/2012  . SNORING 05/03/2007  . HYPERGLYCEMIA 05/03/2007  . PHARYNGITIS, ACUTE 02/22/2007  . HYPERPLASIA, ENDOMETRIAL NOS 02/22/2007  . DYSMENORRHEA 10/18/2006  . DEGENERATIVE JOINT DISEASE 10/18/2006  . INSOMNIA 10/18/2006  . CHEST PAIN 10/18/2006  . OBESITY 10/05/2006  . ANXIETY 10/05/2006  . HYPERTENSION 10/05/2006  . GASTROENTERITIS 10/05/2006  . VAGINAL BLEEDING 10/05/2006  . MENORRHAGIA, PERIMENOPAUSAL 10/05/2006  . PATELLO-FEMORAL SYNDROME 10/05/2006    Jaela Yepez  PTA 02/19/2015, 10:32 AM  [place] [address] [place], [place], [address] Phone: [phone]   Fax:  [phone]

## 2015-02-25 ENCOUNTER — Other Ambulatory Visit: Payer: Self-pay | Admitting: Physician Assistant

## 2015-02-25 ENCOUNTER — Ambulatory Visit: Payer: Medicare Other | Attending: Orthopedic Surgery | Admitting: Physical Therapy

## 2015-02-25 ENCOUNTER — Encounter: Payer: Self-pay | Admitting: Physical Therapy

## 2015-02-25 DIAGNOSIS — M25562 Pain in left knee: Secondary | ICD-10-CM | POA: Diagnosis not present

## 2015-02-25 DIAGNOSIS — Z4789 Encounter for other orthopedic aftercare: Secondary | ICD-10-CM | POA: Insufficient documentation

## 2015-02-25 DIAGNOSIS — M25662 Stiffness of left knee, not elsewhere classified: Secondary | ICD-10-CM | POA: Diagnosis not present

## 2015-02-25 DIAGNOSIS — Z96652 Presence of left artificial knee joint: Secondary | ICD-10-CM | POA: Insufficient documentation

## 2015-02-25 NOTE — Therapy (Signed)
[place], [place], [address] Phone: [phone]   Fax:  [phone]  Physical Therapy Treatment  Patient Details  Name: Morgan Taylor MRN: [id number] Date of Birth: 03/28/1961 Referring Provider:  Yvette Rack., MD  Encounter Date: 02/25/2015    Past Medical History  Diagnosis Date  . Diabetes mellitus   . Hypertension   . Arthritis   . Family history of anesthesia complication     Patients son has difficulty waking up after anesthesia  . Hypothyroidism   . Anxiety   . Depression   . Cancer 2009    lumpectomy  . Hyperlipemia   . GERD (gastroesophageal reflux disease)     Past Surgical History  Procedure Laterality Date  . Appendectomy    . Shoulder arthroscopy Left     cleaned out joint  . Cesarean section    . Dilation and curettage of uterus    . Breast lumpectomy Left   . Colonoscopy    . Total knee arthroplasty Right 08/15/2014    Procedure: RIGHT TOTAL KNEE ARTHROPLASTY;  Surgeon: Yvette Rack., MD;  Location: [facility];  Service: Orthopedics;  Laterality: Right;  . Total knee arthroplasty Left 01/23/2015    Procedure: TOTAL KNEE ARTHROPLASTY;  Surgeon: Yvette Rack., MD;  Location: [facility];  Service: Orthopedics;  Laterality: Left;    There were no vitals taken for this visit.  Visit Diagnosis:  Left knee pain  Knee stiffness, left  Stiffness of knee joint, left  Knee pain, acute, left      Subjective Assessment - 02/25/15 1107    Symptoms It's stiff and hurts   Currently in Pain? Yes   Pain Score 8    Pain Location Knee   Pain Orientation Left          [facility] PT Assessment - 02/25/15 0001    ROM / Strength   AROM / PROM / Strength AROM   AROM   AROM Assessment Site Knee   Right/Left Knee Left   Left Knee Extension 8   Left Knee Flexion 98                  [facility] Adult PT Treatment/Exercise - 02/25/15 0001    Exercises   Exercises Knee/Hip   Knee/Hip Exercises: Aerobic   Stationary Bike 6 minutes   Tread Mill nustep  level 6 x 6 minutes   Knee/Hip Exercises: Machines for Strengthening   Cybex Knee Extension 10# 2x15   Cybex Knee Flexion 25# 2x15   Cybex Leg Press 40# 2x15   Knee/Hip Exercises: Standing   Walking with Sports Cord 30# pulleys  forward then backward   Other Standing Knee Exercises manual treadmill   push then pull   Knee/Hip Exercises: Seated   Stool Scoot - Round Trips 1   Modalities   Modalities Cryotherapy;Electrical Stimulation   Cryotherapy   Number Minutes Cryotherapy 15 Minutes   Cryotherapy Location Knee   Type of Cryotherapy Other (comment)  vaso   Electrical Stimulation   Electrical Stimulation Location left knee   Electrical Stimulation Parameters IFC   Electrical Stimulation Goals Pain;Edema                  PT Short Term Goals - 02/25/15 1152    PT SHORT TERM GOAL #1   Title independent with HEP   Time 4   Period Weeks   Status Achieved  PT Long Term Goals - 02/25/15 1153    PT LONG TERM GOAL #1   Title decrease pain 50%   Time 8   Period Weeks   Status On-going   PT LONG TERM GOAL #2   Title increase ROM to 10-110 degrees flexion   Time 8   Period Weeks   Status On-going   PT LONG TERM GOAL #3   Title independent with HEP   Time 8   Period Weeks   Status On-going   PT LONG TERM GOAL #4   Title independent with RICE   Time 8   Period Weeks   Status Achieved               Plan - 02/25/15 1155    Clinical Impression Statement Antalgic gait with decreased toe off.     Pt will benefit from skilled therapeutic intervention in order to improve on the following deficits Abnormal gait;Decreased activity tolerance;Decreased balance;Decreased mobility;Decreased endurance;Decreased range of motion;Decreased scar mobility;Decreased strength;Increased edema;Difficulty walking;Impaired flexibility   Rehab Potential Good   PT Frequency 3x / week    PT Duration 8 weeks   PT Treatment/Interventions Cryotherapy;Electrical Stimulation;Functional mobility training;Stair training;Gait training;Therapeutic exercise;Balance training;Neuromuscular re-education;Manual techniques;Passive range of motion;Patient/family education   PT Next Visit Plan Continue to strengthen.    Consulted and Agree with Plan of Care Patient        Problem List Patient Active Problem List   Diagnosis Date Noted  . primary Osteoarthritis of left knee 01/23/2015  . Osteoarthritis of right knee 08/15/2014  . Cancer of lower-outer quadrant of female breast 01/23/2012  . SNORING 05/03/2007  . HYPERGLYCEMIA 05/03/2007  . PHARYNGITIS, ACUTE 02/22/2007  . HYPERPLASIA, ENDOMETRIAL NOS 02/22/2007  . DYSMENORRHEA 10/18/2006  . DEGENERATIVE JOINT DISEASE 10/18/2006  . INSOMNIA 10/18/2006  . CHEST PAIN 10/18/2006  . OBESITY 10/05/2006  . ANXIETY 10/05/2006  . HYPERTENSION 10/05/2006  . GASTROENTERITIS 10/05/2006  . VAGINAL BLEEDING 10/05/2006  . MENORRHAGIA, PERIMENOPAUSAL 10/05/2006  . PATELLO-FEMORAL SYNDROME 10/05/2006    Iysis Germain PTA 02/25/2015, 11:58 AM  [place] [address] [place], [place], [address] Phone: [phone]   Fax:  [phone]

## 2015-02-27 ENCOUNTER — Encounter: Payer: Self-pay | Admitting: Physical Therapy

## 2015-02-27 ENCOUNTER — Ambulatory Visit: Payer: Medicare Other | Admitting: Physical Therapy

## 2015-02-27 DIAGNOSIS — M25562 Pain in left knee: Secondary | ICD-10-CM

## 2015-02-27 DIAGNOSIS — M25662 Stiffness of left knee, not elsewhere classified: Secondary | ICD-10-CM

## 2015-02-27 DIAGNOSIS — Z4789 Encounter for other orthopedic aftercare: Secondary | ICD-10-CM | POA: Diagnosis not present

## 2015-02-27 NOTE — Therapy (Signed)
[place], [place], [address] Phone: [phone]   Fax:  [phone]  Physical Therapy Treatment  Patient Details  Name: Morgan Taylor MRN: [id number] Date of Birth: [birth year] Referring Provider:  Yvette Rack., MD  Encounter Date: 02/27/2015      PT End of Session - 02/27/15 0914    Visit Number 5   PT Start Time 6256   PT Stop Time 0950   PT Time Calculation (min) 55 min      Past Medical History  Diagnosis Date  . Diabetes mellitus   . Hypertension   . Arthritis   . Family history of anesthesia complication     Patients son has difficulty waking up after anesthesia  . Hypothyroidism   . Anxiety   . Depression   . Cancer 2009    lumpectomy  . Hyperlipemia   . GERD (gastroesophageal reflux disease)     Past Surgical History  Procedure Laterality Date  . Appendectomy    . Shoulder arthroscopy Left     cleaned out joint  . Cesarean section    . Dilation and curettage of uterus    . Breast lumpectomy Left   . Colonoscopy    . Total knee arthroplasty Right 08/15/2014    Procedure: RIGHT TOTAL KNEE ARTHROPLASTY;  Surgeon: Yvette Rack., MD;  Location: [facility];  Service: Orthopedics;  Laterality: Right;  . Total knee arthroplasty Left 01/23/2015    Procedure: TOTAL KNEE ARTHROPLASTY;  Surgeon: Yvette Rack., MD;  Location: [facility];  Service: Orthopedics;  Laterality: Left;    There were no vitals taken for this visit.  Visit Diagnosis:  Knee stiffness, left  Left knee pain      Subjective Assessment - 02/27/15 0901    Symptoms feeling pretty good,doing HEP   Currently in Pain? Yes   Pain Score 4    Pain Location Knee   Pain Orientation Left          [facility] PT Assessment - 02/27/15 0001    ROM / Strength   AROM / PROM / Strength AROM                  [facility] Adult PT Treatment/Exercise - 02/27/15 0001    Knee/Hip Exercises: Aerobic   Stationary Bike 6  minutes   Tread Mill Nustep L5 6 min   Knee/Hip Exercises: Machines for Strengthening   Cybex Knee Extension 10# 2x15   Cybex Knee Flexion 25# 2x15   Cybex Leg Press 40# 2x15   Total Gym Leg Press 40# calf raise 2 sets 15  2x15   Knee/Hip Exercises: Standing   Terminal Knee Extension 2 sets;15 reps  ball vs wall   Lateral Step Up 1 set;15 reps  6in   Forward Step Up 1 set;15 reps  6 in   Cryotherapy   Number Minutes Cryotherapy 15 Minutes   Cryotherapy Location Knee   Type of Cryotherapy Other (comment)  VASO   Electrical Stimulation   Electrical Stimulation Location left knee   Electrical Stimulation Parameters IFC   Electrical Stimulation Goals Pain                  PT Short Term Goals - 02/25/15 1152    PT SHORT TERM GOAL #1   Title independent with HEP   Time 4   Period Weeks   Status Achieved  PT Long Term Goals - 02/25/15 1153    PT LONG TERM GOAL #1   Title decrease pain 50%   Time 8   Period Weeks   Status On-going   PT LONG TERM GOAL #2   Title increase ROM to 10-110 degrees flexion   Time 8   Period Weeks   Status On-going   PT LONG TERM GOAL #3   Title independent with HEP   Time 8   Period Weeks   Status On-going   PT LONG TERM GOAL #4   Title independent with RICE   Time 8   Period Weeks   Status Achieved               Plan - 02/27/15 0915    Clinical Impression Statement decreased TKE with quad lag, decreased heel strike. Tolerated ther ex well,focus on quad activation.        Problem List Patient Active Problem List   Diagnosis Date Noted  . primary Osteoarthritis of left knee 01/23/2015  . Osteoarthritis of right knee 08/15/2014  . Cancer of lower-outer quadrant of female breast 01/23/2012  . SNORING 05/03/2007  . HYPERGLYCEMIA 05/03/2007  . PHARYNGITIS, ACUTE 02/22/2007  . HYPERPLASIA, ENDOMETRIAL NOS 02/22/2007  . DYSMENORRHEA 10/18/2006  . DEGENERATIVE JOINT DISEASE 10/18/2006  . INSOMNIA  10/18/2006  . CHEST PAIN 10/18/2006  . OBESITY 10/05/2006  . ANXIETY 10/05/2006  . HYPERTENSION 10/05/2006  . GASTROENTERITIS 10/05/2006  . VAGINAL BLEEDING 10/05/2006  . MENORRHAGIA, PERIMENOPAUSAL 10/05/2006  . PATELLO-FEMORAL SYNDROME 10/05/2006    Anamaria Dusenbury,ANGIE,PTA 02/27/2015, 9:18 AM  [address] [place], [place], [address] Phone: [phone]   Fax:  [phone]

## 2015-03-04 ENCOUNTER — Ambulatory Visit: Payer: Medicare Other | Admitting: Physical Therapy

## 2015-03-04 ENCOUNTER — Encounter: Payer: Self-pay | Admitting: Physical Therapy

## 2015-03-04 DIAGNOSIS — M25562 Pain in left knee: Secondary | ICD-10-CM

## 2015-03-04 DIAGNOSIS — Z4789 Encounter for other orthopedic aftercare: Secondary | ICD-10-CM | POA: Diagnosis not present

## 2015-03-04 DIAGNOSIS — M25662 Stiffness of left knee, not elsewhere classified: Secondary | ICD-10-CM

## 2015-03-04 NOTE — Therapy (Signed)
[place] [address] [address] [place], [place], [address] Phone: [phone]   Fax:  [phone]  Physical Therapy Treatment  Patient Details  Name: Morgan Taylor MRN: [id number] Date of Birth: [birth year] Referring Provider:  Earlie Server, MD  Encounter Date: 03/04/2015      PT End of Session - 03/04/15 1550    Visit Number 6   Number of Visits 24   Date for PT Re-Evaluation 04/13/15   Authorization Type Medicare/medicaid   PT Start Time 1506   PT Stop Time 1546   PT Time Calculation (min) 40 min   Activity Tolerance Patient tolerated treatment well   Behavior During Therapy [facility] for tasks assessed/performed      Past Medical History  Diagnosis Date  . Diabetes mellitus   . Hypertension   . Arthritis   . Family history of anesthesia complication     Patients son has difficulty waking up after anesthesia  . Hypothyroidism   . Anxiety   . Depression   . Cancer 2009    lumpectomy  . Hyperlipemia   . GERD (gastroesophageal reflux disease)     Past Surgical History  Procedure Laterality Date  . Appendectomy    . Shoulder arthroscopy Left     cleaned out joint  . Cesarean section    . Dilation and curettage of uterus    . Breast lumpectomy Left   . Colonoscopy    . Total knee arthroplasty Right 08/15/2014    Procedure: RIGHT TOTAL KNEE ARTHROPLASTY;  Surgeon: Yvette Rack., MD;  Location: [facility];  Service: Orthopedics;  Laterality: Right;  . Total knee arthroplasty Left 01/23/2015    Procedure: TOTAL KNEE ARTHROPLASTY;  Surgeon: Yvette Rack., MD;  Location: [facility];  Service: Orthopedics;  Laterality: Left;    There were no vitals taken for this visit.  Visit Diagnosis:  Knee stiffness, left  Left knee pain  Stiffness of knee joint, left  Knee pain, acute, left      Subjective Assessment - 03/04/15 1510    Symptoms My scar is bothering me.   Pain Score 1    Pain Location Knee   Pain  Orientation Left          [facility] PT Assessment - 03/04/15 0001    ROM / Strength   AROM / PROM / Strength AROM   AROM   AROM Assessment Site Knee   Right/Left Knee Left   Left Knee Extension 3   Left Knee Flexion 101                  [facility] Adult PT Treatment/Exercise - 03/04/15 0001    Balance   Balance Assessed Yes   Static Standing Balance   Single Leg Stance - Left Leg 10  seconds x 3   Dynamic Standing Balance   Dynamic Standing - Balance Support No upper extremity supported   Dynamic Standing - Level of Assistance 5: Stand by assistance   Dynamic Standing - Balance Activities Eyes open;Eyes closed;Head turns;Other (comment)  on foam   Dynamic Standing - Comments tandem walking, braiding, tandem stance(eyes open/closed)   Exercises   Exercises Knee/Hip   Knee/Hip Exercises: Aerobic   Stationary Bike 6 minutes   Elliptical 5 minutes  incline 10; resistance 5   Knee/Hip Exercises: Machines for Strengthening   Cybex Knee Extension 10# 2x10  at end of range   Cybex Knee Flexion 25# 2x10  at end of range   Cybex Leg Press 40# 2x15   Total Gym Leg Press 40# calf raise 2 sets 15                  PT Short Term Goals - 02/25/15 1152    PT SHORT TERM GOAL #1   Title independent with HEP   Time 4   Period Weeks   Status Achieved           PT Long Term Goals - 03/04/15 1526    PT LONG TERM GOAL #1   Title decrease pain 50%   Time 8   Period Weeks   Status Achieved   PT LONG TERM GOAL #2   Title increase ROM to 10-110 degrees flexion   Time 8   Period Weeks   Status On-going   PT LONG TERM GOAL #3   Title independent with HEP   Time 8   Period Weeks   Status On-going   PT LONG TERM GOAL #4   Title independent with RICE   Time 8   Period Weeks   Status Achieved               Plan - 03/04/15 1551    Clinical Impression Statement Improved balance, ROM, and gait.   Pt will benefit from skilled therapeutic intervention in  order to improve on the following deficits Abnormal gait;Decreased activity tolerance;Decreased balance;Decreased mobility;Decreased endurance;Decreased range of motion;Decreased scar mobility;Decreased strength;Increased edema;Difficulty walking;Impaired flexibility   Rehab Potential Good   PT Frequency 3x / week   PT Duration 8 weeks   PT Treatment/Interventions Cryotherapy;Electrical Stimulation;Functional mobility training;Stair training;Gait training;Therapeutic exercise;Balance training;Neuromuscular re-education;Manual techniques;Passive range of motion;Patient/family education   PT Next Visit Plan Continue to strengthen.    Consulted and Agree with Plan of Care Patient        Problem List Patient Active Problem List   Diagnosis Date Noted  . primary Osteoarthritis of left knee 01/23/2015  . Osteoarthritis of right knee 08/15/2014  . Cancer of lower-outer quadrant of female breast 01/23/2012  . SNORING 05/03/2007  . HYPERGLYCEMIA 05/03/2007  . PHARYNGITIS, ACUTE 02/22/2007  . HYPERPLASIA, ENDOMETRIAL NOS 02/22/2007  . DYSMENORRHEA 10/18/2006  . DEGENERATIVE JOINT DISEASE 10/18/2006  . INSOMNIA 10/18/2006  . CHEST PAIN 10/18/2006  . OBESITY 10/05/2006  . ANXIETY 10/05/2006  . HYPERTENSION 10/05/2006  . GASTROENTERITIS 10/05/2006  . VAGINAL BLEEDING 10/05/2006  . MENORRHAGIA, PERIMENOPAUSAL 10/05/2006  . PATELLO-FEMORAL SYNDROME 10/05/2006    Cosby Proby PTA 03/04/2015, 3:52 PM  [place] [address] [place], [place], [address] Phone: [phone]   Fax:  [phone]

## 2015-03-06 ENCOUNTER — Ambulatory Visit: Payer: Medicare Other | Admitting: Physical Therapy

## 2015-03-06 ENCOUNTER — Encounter: Payer: Self-pay | Admitting: Physical Therapy

## 2015-03-06 DIAGNOSIS — M25562 Pain in left knee: Secondary | ICD-10-CM

## 2015-03-06 DIAGNOSIS — M25662 Stiffness of left knee, not elsewhere classified: Secondary | ICD-10-CM

## 2015-03-06 DIAGNOSIS — Z4789 Encounter for other orthopedic aftercare: Secondary | ICD-10-CM | POA: Diagnosis not present

## 2015-03-06 NOTE — Therapy (Signed)
[address] [place], [place], [address] Phone: [phone]   Fax:  [phone]  Physical Therapy Treatment  Patient Details  Name: Morgan Taylor MRN: [id number] Date of Birth: 10/21/1961 Referring Provider:  Earlie Server, MD  Encounter Date: 03/06/2015      PT End of Session - 03/06/15 1142    Visit Number 7   Number of Visits 24   Date for PT Re-Evaluation 04/13/15   PT Start Time 1101   PT Stop Time 1143   PT Time Calculation (min) 42 min   Activity Tolerance Patient tolerated treatment well      Past Medical History  Diagnosis Date  . Diabetes mellitus   . Hypertension   . Arthritis   . Family history of anesthesia complication     Patients son has difficulty waking up after anesthesia  . Hypothyroidism   . Anxiety   . Depression   . Cancer 2009    lumpectomy  . Hyperlipemia   . GERD (gastroesophageal reflux disease)     Past Surgical History  Procedure Laterality Date  . Appendectomy    . Shoulder arthroscopy Left     cleaned out joint  . Cesarean section    . Dilation and curettage of uterus    . Breast lumpectomy Left   . Colonoscopy    . Total knee arthroplasty Right 08/15/2014    Procedure: RIGHT TOTAL KNEE ARTHROPLASTY;  Surgeon: Yvette Rack., MD;  Location: [facility];  Service: Orthopedics;  Laterality: Right;  . Total knee arthroplasty Left 01/23/2015    Procedure: TOTAL KNEE ARTHROPLASTY;  Surgeon: Yvette Rack., MD;  Location: [facility];  Service: Orthopedics;  Laterality: Left;    There were no vitals filed for this visit.  Visit Diagnosis:  Knee stiffness, left  Left knee pain      Subjective Assessment - 03/06/15 1103    Symptoms Left knee is sore, but I am walking better   Pertinent History left knee pain for about 5 years prior to the TKR, right TKR August 2015   Limitations Standing;Walking;House hold activities   How long can you sit comfortably? 10 minutes    How long can you stand comfortably? 5 minutes   How long can you walk comfortably? 5 minutes   Currently in Pain? Yes   Pain Score 2    Pain Location Knee   Pain Orientation Left   Pain Descriptors / Indicators Aching   Pain Type Acute pain   Pain Onset 1 to 4 weeks ago   Pain Frequency Intermittent   Aggravating Factors  being up on it   Pain Relieving Factors rest, pain meds                       [facility] Adult PT Treatment/Exercise - 03/06/15 0001    Ambulation/Gait   Stairs Yes   Stairs Assistance 7: Independent   Stair Management Technique Alternating pattern   Number of Stairs 30   Gait Comments performed resisted gait all directions   Knee/Hip Exercises: Stretches   Gastroc Stretch 20 seconds;3 reps   Soleus Stretch 20 seconds;3 reps   Knee/Hip Exercises: Aerobic   Elliptical 5 minutes   Tread Mill Nu Step Level 5 x 6 minutes   Knee/Hip Exercises: Machines for Strengthening   Cybex Knee Extension 10# 2x10   Cybex Knee Flexion 25# 2x10   Cybex Leg Press 40#  2x15   Knee/Hip Exercises: Standing   Other Standing Knee Exercises 5# hip extension and abduction   Manual Therapy   Manual Therapy Joint mobilization;Passive ROM;Myofascial release   Myofascial Release scar and patellar mobs   Passive ROM PROM of the rleft knee                  PT Short Term Goals - 02/25/15 1152    PT SHORT TERM GOAL #1   Title independent with HEP   Time 4   Period Weeks   Status Achieved           PT Long Term Goals - 03/06/15 1144    PT LONG TERM GOAL #2   Title increase ROM to 10-110 degrees flexion   Status On-going               Plan - 03/06/15 1143    Clinical Impression Statement Able to do stairs step over step ascend and descend today for the "first time in a long time"   Pt will benefit from skilled therapeutic intervention in order to improve on the following deficits Abnormal gait;Decreased activity tolerance;Decreased  balance;Decreased mobility;Decreased endurance;Decreased range of motion;Decreased scar mobility;Decreased strength;Increased edema;Difficulty walking;Impaired flexibility   PT Frequency 3x / week   PT Duration 6 weeks   PT Treatment/Interventions Cryotherapy;Electrical Stimulation;Functional mobility training;Stair training;Gait training;Therapeutic exercise;Balance training;Neuromuscular re-education;Manual techniques;Passive range of motion;Patient/family education   PT Next Visit Plan Continue to strengthen.    Consulted and Agree with Plan of Care Patient        Problem List Patient Active Problem List   Diagnosis Date Noted  . primary Osteoarthritis of left knee 01/23/2015  . Osteoarthritis of right knee 08/15/2014  . Cancer of lower-outer quadrant of female breast 01/23/2012  . SNORING 05/03/2007  . HYPERGLYCEMIA 05/03/2007  . PHARYNGITIS, ACUTE 02/22/2007  . HYPERPLASIA, ENDOMETRIAL NOS 02/22/2007  . DYSMENORRHEA 10/18/2006  . DEGENERATIVE JOINT DISEASE 10/18/2006  . INSOMNIA 10/18/2006  . CHEST PAIN 10/18/2006  . OBESITY 10/05/2006  . ANXIETY 10/05/2006  . HYPERTENSION 10/05/2006  . GASTROENTERITIS 10/05/2006  . VAGINAL BLEEDING 10/05/2006  . MENORRHAGIA, PERIMENOPAUSAL 10/05/2006  . PATELLO-FEMORAL SYNDROME 10/05/2006    Sumner Boast, PT 03/06/2015, 11:45 AM  [place] [address] [place], [place], [address] Phone: [phone]   Fax:  [phone]

## 2015-03-10 ENCOUNTER — Ambulatory Visit: Payer: Medicare Other | Admitting: Physical Therapy

## 2015-03-10 ENCOUNTER — Encounter: Payer: Self-pay | Admitting: Physical Therapy

## 2015-03-10 DIAGNOSIS — M25662 Stiffness of left knee, not elsewhere classified: Secondary | ICD-10-CM

## 2015-03-10 DIAGNOSIS — M25562 Pain in left knee: Secondary | ICD-10-CM

## 2015-03-10 DIAGNOSIS — Z4789 Encounter for other orthopedic aftercare: Secondary | ICD-10-CM | POA: Diagnosis not present

## 2015-03-10 NOTE — Therapy (Signed)
[place], [place], [address] Phone: [phone]   Fax:  [phone]  Physical Therapy Treatment  Patient Details  Name: Morgan Taylor MRN: [id number] Date of Birth: [birth year] Referring Provider:  Earlie Server, MD  Encounter Date: 03/10/2015      PT End of Session - 03/10/15 1103    Visit Number 8   Number of Visits 24   Date for PT Re-Evaluation 04/13/15   PT Start Time 1028   PT Stop Time 1104   PT Time Calculation (min) 36 min   Activity Tolerance Patient tolerated treatment well   Behavior During Therapy [facility] for tasks assessed/performed      Past Medical History  Diagnosis Date  . Diabetes mellitus   . Hypertension   . Arthritis   . Family history of anesthesia complication     Patients son has difficulty waking up after anesthesia  . Hypothyroidism   . Anxiety   . Depression   . Cancer 2009    lumpectomy  . Hyperlipemia   . GERD (gastroesophageal reflux disease)     Past Surgical History  Procedure Laterality Date  . Appendectomy    . Shoulder arthroscopy Left     cleaned out joint  . Cesarean section    . Dilation and curettage of uterus    . Breast lumpectomy Left   . Colonoscopy    . Total knee arthroplasty Right 08/15/2014    Procedure: RIGHT TOTAL KNEE ARTHROPLASTY;  Surgeon: Yvette Rack., MD;  Location: [facility];  Service: Orthopedics;  Laterality: Right;  . Total knee arthroplasty Left 01/23/2015    Procedure: TOTAL KNEE ARTHROPLASTY;  Surgeon: Yvette Rack., MD;  Location: [facility];  Service: Orthopedics;  Laterality: Left;    There were no vitals filed for this visit.  Visit Diagnosis:  Knee stiffness, left  Left knee pain  Stiffness of knee joint, left  Knee pain, acute, left      Subjective Assessment - 03/10/15 1029    Symptoms Doing ok, but I didn't take any pain meds today.   Currently in Pain? Yes   Pain Score 5    Pain Location Knee   Pain  Orientation Left   Pain Onset 1 to 4 weeks ago   Multiple Pain Sites No            [facility] PT Assessment - 03/10/15 0001    ROM / Strength   AROM / PROM / Strength AROM   AROM   Right/Left Knee Left   Left Knee Extension 7   Left Knee Flexion 104                   [facility] Adult PT Treatment/Exercise - 03/10/15 0001    Exercises   Exercises Knee/Hip   Knee/Hip Exercises: Stretches   Active Hamstring Stretch Other (comment)  2 minutes in long sitting   Knee/Hip Exercises: Aerobic   Stationary Bike 6 minutes   Elliptical 6 minutes  50fwd/3bk   Tread Mill manual push/pull  10 each   Knee/Hip Exercises: Machines for Strengthening   Cybex Knee Extension 10# 2x10  at EOR   Cybex Knee Flexion 25# 2x15   Cybex Leg Press 40# 2x15   Knee/Hip Exercises: Seated   Stool Scoot - Round Trips 2                  PT Short Term Goals -  02/25/15 1152    PT SHORT TERM GOAL #1   Title independent with HEP   Time 4   Period Weeks   Status Achieved           PT Long Term Goals - 03/10/15 1037    PT LONG TERM GOAL #1   Title decrease pain 50%   Time 8   Period Weeks   Status Achieved   PT LONG TERM GOAL #3   Title independent with HEP   Time 8   Period Weeks   Status On-going   PT LONG TERM GOAL #4   Title independent with RICE   Time 8   Status Achieved               Problem List Patient Active Problem List   Diagnosis Date Noted  . primary Osteoarthritis of left knee 01/23/2015  . Osteoarthritis of right knee 08/15/2014  . Cancer of lower-outer quadrant of female breast 01/23/2012  . SNORING 05/03/2007  . HYPERGLYCEMIA 05/03/2007  . PHARYNGITIS, ACUTE 02/22/2007  . HYPERPLASIA, ENDOMETRIAL NOS 02/22/2007  . DYSMENORRHEA 10/18/2006  . DEGENERATIVE JOINT DISEASE 10/18/2006  . INSOMNIA 10/18/2006  . CHEST PAIN 10/18/2006  . OBESITY 10/05/2006  . ANXIETY 10/05/2006  . HYPERTENSION 10/05/2006  . GASTROENTERITIS 10/05/2006  . VAGINAL  BLEEDING 10/05/2006  . MENORRHAGIA, PERIMENOPAUSAL 10/05/2006  . PATELLO-FEMORAL SYNDROME 10/05/2006    Cipriano Millikan PTA 03/10/2015, 11:05 AM  [place] [address] [place], [place], [address] Phone: [phone]   Fax:  [phone]

## 2015-03-12 ENCOUNTER — Ambulatory Visit: Payer: Medicare Other | Admitting: Physical Therapy

## 2015-03-17 ENCOUNTER — Ambulatory Visit: Payer: Medicare Other | Admitting: Physical Therapy

## 2015-03-17 ENCOUNTER — Encounter: Payer: Self-pay | Admitting: Physical Therapy

## 2015-03-17 DIAGNOSIS — M25662 Stiffness of left knee, not elsewhere classified: Secondary | ICD-10-CM

## 2015-03-17 DIAGNOSIS — Z4789 Encounter for other orthopedic aftercare: Secondary | ICD-10-CM | POA: Diagnosis not present

## 2015-03-17 DIAGNOSIS — M25562 Pain in left knee: Secondary | ICD-10-CM

## 2015-03-17 NOTE — Therapy (Signed)
[address] [place], [place], [address] Phone: [phone]   Fax:  [phone]  Physical Therapy Treatment  Patient Details  Name: Morgan Taylor MRN: [id number] Date of Birth: June 20, 1961 Referring Provider:  Earlie Server, MD  Encounter Date: 03/17/2015    Past Medical History  Diagnosis Date  . Diabetes mellitus   . Hypertension   . Arthritis   . Family history of anesthesia complication     Patients son has difficulty waking up after anesthesia  . Hypothyroidism   . Anxiety   . Depression   . Cancer 2009    lumpectomy  . Hyperlipemia   . GERD (gastroesophageal reflux disease)     Past Surgical History  Procedure Laterality Date  . Appendectomy    . Shoulder arthroscopy Left     cleaned out joint  . Cesarean section    . Dilation and curettage of uterus    . Breast lumpectomy Left   . Colonoscopy    . Total knee arthroplasty Right 08/15/2014    Procedure: RIGHT TOTAL KNEE ARTHROPLASTY;  Surgeon: Yvette Rack., MD;  Location: [place];  Service: Orthopedics;  Laterality: Right;  . Total knee arthroplasty Left 01/23/2015    Procedure: TOTAL KNEE ARTHROPLASTY;  Surgeon: Yvette Rack., MD;  Location: [facility];  Service: Orthopedics;  Laterality: Left;    There were no vitals filed for this visit.  Visit Diagnosis:  No diagnosis found.      Subjective Assessment - 03/17/15 1014    Symptoms It kinda hurts today.   Currently in Pain? Yes   Pain Score 5             [facility] PT Assessment - 03/17/15 0001    ROM / Strength   AROM / PROM / Strength AROM   AROM   Right/Left Knee Left   Left Knee Extension 5   Left Knee Flexion 97                   [facility] Adult PT Treatment/Exercise - 03/17/15 0001    Exercises   Exercises Knee/Hip   Knee/Hip Exercises: Aerobic   Stationary Bike 6 minutes   Tread Mill 6 minutes  Nustep level 6   Knee/Hip Exercises: Machines for Strengthening    Total Gym Leg Press no wt for ROM   Knee/Hip Exercises: Standing   Wall Squat 2 sets;10 reps  with ball   Knee/Hip Exercises: Seated   Stool Scoot - Round Trips 2   Modalities   Modalities Cryotherapy;Electrical Stimulation   Cryotherapy   Number Minutes Cryotherapy 15 Minutes   Cryotherapy Location Knee   Type of Cryotherapy Other (comment)  vaso   Electrical Stimulation   Electrical Stimulation Location left knee   Electrical Stimulation Parameters IFC   Electrical Stimulation Goals Pain                  PT Short Term Goals - 02/25/15 1152    PT SHORT TERM GOAL #1   Title independent with HEP   Time 4   Period Weeks   Status Achieved           PT Long Term Goals - 03/10/15 1037    PT LONG TERM GOAL #1   Title decrease pain 50%   Time 8   Period Weeks   Status Achieved   PT LONG TERM GOAL #3   Title independent with HEP  Time 8   Period Weeks   Status On-going   PT LONG TERM GOAL #4   Title independent with RICE   Time 8   Status Achieved               Problem List Patient Active Problem List   Diagnosis Date Noted  . primary Osteoarthritis of left knee 01/23/2015  . Osteoarthritis of right knee 08/15/2014  . Cancer of lower-outer quadrant of female breast 01/23/2012  . SNORING 05/03/2007  . HYPERGLYCEMIA 05/03/2007  . PHARYNGITIS, ACUTE 02/22/2007  . HYPERPLASIA, ENDOMETRIAL NOS 02/22/2007  . DYSMENORRHEA 10/18/2006  . DEGENERATIVE JOINT DISEASE 10/18/2006  . INSOMNIA 10/18/2006  . CHEST PAIN 10/18/2006  . OBESITY 10/05/2006  . ANXIETY 10/05/2006  . HYPERTENSION 10/05/2006  . GASTROENTERITIS 10/05/2006  . VAGINAL BLEEDING 10/05/2006  . MENORRHAGIA, PERIMENOPAUSAL 10/05/2006  . PATELLO-FEMORAL SYNDROME 10/05/2006    Chanie Soucek PTA 03/17/2015, 11:02 AM  [address] [place], [place], [address] Phone: [phone]   Fax:  [phone]

## 2015-03-18 ENCOUNTER — Emergency Department (HOSPITAL_COMMUNITY)
Admission: EM | Admit: 2015-03-18 | Discharge: 2015-03-18 | Disposition: A | Discharge status: Home / Self Care | Payer: Medicare Other | Attending: Emergency Medicine | Admitting: Emergency Medicine

## 2015-03-18 ENCOUNTER — Emergency Department (HOSPITAL_COMMUNITY): Payer: Medicare Other

## 2015-03-18 ENCOUNTER — Encounter (HOSPITAL_COMMUNITY): Payer: Self-pay | Admitting: *Deleted

## 2015-03-18 DIAGNOSIS — M546 Pain in thoracic spine: Secondary | ICD-10-CM | POA: Insufficient documentation

## 2015-03-18 DIAGNOSIS — M545 Low back pain: Secondary | ICD-10-CM | POA: Insufficient documentation

## 2015-03-18 DIAGNOSIS — Z859 Personal history of malignant neoplasm, unspecified: Secondary | ICD-10-CM | POA: Insufficient documentation

## 2015-03-18 DIAGNOSIS — R739 Hyperglycemia, unspecified: Secondary | ICD-10-CM

## 2015-03-18 DIAGNOSIS — K219 Gastro-esophageal reflux disease without esophagitis: Secondary | ICD-10-CM | POA: Insufficient documentation

## 2015-03-18 DIAGNOSIS — Z794 Long term (current) use of insulin: Secondary | ICD-10-CM | POA: Insufficient documentation

## 2015-03-18 DIAGNOSIS — F419 Anxiety disorder, unspecified: Secondary | ICD-10-CM | POA: Insufficient documentation

## 2015-03-18 DIAGNOSIS — E785 Hyperlipidemia, unspecified: Secondary | ICD-10-CM | POA: Insufficient documentation

## 2015-03-18 DIAGNOSIS — Z87891 Personal history of nicotine dependence: Secondary | ICD-10-CM | POA: Diagnosis not present

## 2015-03-18 DIAGNOSIS — Z79899 Other long term (current) drug therapy: Secondary | ICD-10-CM | POA: Diagnosis not present

## 2015-03-18 DIAGNOSIS — M79602 Pain in left arm: Secondary | ICD-10-CM | POA: Diagnosis not present

## 2015-03-18 DIAGNOSIS — Z7901 Long term (current) use of anticoagulants: Secondary | ICD-10-CM | POA: Diagnosis not present

## 2015-03-18 DIAGNOSIS — R1030 Lower abdominal pain, unspecified: Secondary | ICD-10-CM | POA: Diagnosis not present

## 2015-03-18 DIAGNOSIS — F329 Major depressive disorder, single episode, unspecified: Secondary | ICD-10-CM | POA: Insufficient documentation

## 2015-03-18 DIAGNOSIS — E1065 Type 1 diabetes mellitus with hyperglycemia: Secondary | ICD-10-CM | POA: Insufficient documentation

## 2015-03-18 DIAGNOSIS — I10 Essential (primary) hypertension: Secondary | ICD-10-CM | POA: Insufficient documentation

## 2015-03-18 DIAGNOSIS — M1990 Unspecified osteoarthritis, unspecified site: Secondary | ICD-10-CM | POA: Insufficient documentation

## 2015-03-18 LAB — URINE MICROSCOPIC-ADD ON

## 2015-03-18 LAB — COMPREHENSIVE METABOLIC PANEL
ALK PHOS: 103 U/L (ref 39–117)
ALT: 18 U/L (ref 0–35)
AST: 21 U/L (ref 0–37)
Albumin: 3.7 g/dL (ref 3.5–5.2)
Anion gap: 7 (ref 5–15)
BILIRUBIN TOTAL: 0.5 mg/dL (ref 0.3–1.2)
BUN: 9 mg/dL (ref 6–23)
CALCIUM: 9.3 mg/dL (ref 8.4–10.5)
CHLORIDE: 107 mmol/L (ref 96–112)
CO2: 27 mmol/L (ref 19–32)
Creatinine, Ser: 0.94 mg/dL (ref 0.50–1.10)
GFR calc Af Amer: 79 mL/min — ABNORMAL LOW (ref >90)
GFR, EST NON AFRICAN AMERICAN: 69 mL/min — AB (ref >90)
Glucose, Bld: 241 mg/dL — ABNORMAL HIGH (ref 70–99)
POTASSIUM: 3.3 mmol/L — AB (ref 3.5–5.1)
SODIUM: 141 mmol/L (ref 135–145)
Total Protein: 7 g/dL (ref 6.0–8.3)

## 2015-03-18 LAB — CBG MONITORING, ED: Glucose-Capillary: 214 mg/dL — ABNORMAL HIGH (ref 70–99)

## 2015-03-18 LAB — TROPONIN I

## 2015-03-18 LAB — CBC
HCT: 40.2 % (ref 36.0–46.0)
Hemoglobin: 12.6 g/dL (ref 12.0–15.0)
MCH: 27.9 pg (ref 26.0–34.0)
MCHC: 31.3 g/dL (ref 30.0–36.0)
MCV: 88.9 fL (ref 78.0–100.0)
Platelets: 240 10*3/uL (ref 150–400)
RBC: 4.52 MIL/uL (ref 3.87–5.11)
RDW: 14.5 % (ref 11.5–15.5)
WBC: 5.8 10*3/uL (ref 4.0–10.5)

## 2015-03-18 LAB — URINALYSIS, ROUTINE W REFLEX MICROSCOPIC
BILIRUBIN URINE: NEGATIVE
Glucose, UA: >1000 mg/dL — AB
HGB URINE DIPSTICK: NEGATIVE
KETONES UR: 15 mg/dL — AB
LEUKOCYTES UA: NEGATIVE
Nitrite: NEGATIVE
Protein, ur: NEGATIVE mg/dL
Specific Gravity, Urine: 1.022 (ref 1.005–1.030)
Urobilinogen, UA: 0.2 mg/dL (ref 0.0–1.0)
pH: 5 (ref 5.0–8.0)

## 2015-03-18 MED ORDER — SODIUM CHLORIDE 0.9 % IV BOLUS (SEPSIS)
1000.0000 mL | Freq: Once | INTRAVENOUS | Status: AC
  Administered 2015-03-18: 1000 mL via INTRAVENOUS

## 2015-03-18 NOTE — Discharge Instructions (Signed)
Back Pain, Adult Low back pain is very common. About 1 in 5 people have back pain.The cause of low back pain is rarely dangerous. The pain often gets better over time.About half of people with a sudden onset of back pain feel better in just 2 weeks. About 8 in 10 people feel better by 6 weeks.  CAUSES Some common causes of back pain include:  Strain of the muscles or ligaments supporting the spine.  Wear and tear (degeneration) of the spinal discs.  Arthritis.  Direct injury to the back. DIAGNOSIS Most of the time, the direct cause of low back pain is not known.However, back pain can be treated effectively even when the exact cause of the pain is unknown.Answering your caregiver's questions about your overall health and symptoms is one of the most accurate ways to make sure the cause of your pain is not dangerous. If your caregiver needs more information, he or she may order lab work or imaging tests (X-rays or MRIs).However, even if imaging tests show changes in your back, this usually does not require surgery. HOME CARE INSTRUCTIONS For many people, back pain returns.Since low back pain is rarely dangerous, it is often a condition that people can learn to [facility] their own.   Remain active. It is stressful on the back to sit or stand in one place. Do not sit, drive, or stand in one place for more than 30 minutes at a time. Take short walks on level surfaces as soon as pain allows.Try to increase the length of time you walk each day.  Do not stay in bed.Resting more than 1 or 2 days can delay your recovery.  Do not avoid exercise or work.Your body is made to move.It is not dangerous to be active, even though your back may hurt.Your back will likely heal faster if you return to being active before your pain is gone.  Pay attention to your body when you bend and lift. Many people have less discomfortwhen lifting if they bend their knees, keep the load close to their bodies,and  avoid twisting. Often, the most comfortable positions are those that put less stress on your recovering back.  Find a comfortable position to sleep. Use a firm mattress and lie on your side with your knees slightly bent. If you lie on your back, put a pillow under your knees.  Only take over-the-counter or prescription medicines as directed by your caregiver. Over-the-counter medicines to reduce pain and inflammation are often the most helpful.Your caregiver may prescribe muscle relaxant drugs.These medicines help dull your pain so you can more quickly return to your normal activities and healthy exercise.  Put ice on the injured area.  Put ice in a plastic bag.  Place a towel between your skin and the bag.  Leave the ice on for 15-20 minutes, 03-04 times a day for the first 2 to 3 days. After that, ice and heat may be alternated to reduce pain and spasms.  Ask your caregiver about trying back exercises and gentle massage. This may be of some benefit.  Avoid feeling anxious or stressed.Stress increases muscle tension and can worsen back pain.It is important to recognize when you are anxious or stressed and learn ways to manage it.Exercise is a great option. SEEK MEDICAL CARE IF:  You have pain that is not relieved with rest or medicine.  You have pain that does not improve in 1 week.  You have new symptoms.  You are generally not feeling well. SEEK  IMMEDIATE MEDICAL CARE IF:   You have pain that radiates from your back into your legs.  You develop new bowel or bladder control problems.  You have unusual weakness or numbness in your arms or legs.  You develop nausea or vomiting.  You develop abdominal pain.  You feel faint. Document Released: 12/12/2005 Document Revised: 06/12/2012 Document Reviewed: 04/15/2014 [facility] Patient Information 2015 [place], [place]. This information is not intended to replace advice given to you by your health care provider. Make sure you  discuss any questions you have with your health care provider.  Hyperglycemia Hyperglycemia occurs when the glucose (sugar) in your blood is too high. Hyperglycemia can happen for many reasons, but it most often happens to people who do not know they have diabetes or are not managing their diabetes properly.  CAUSES  Whether you have diabetes or not, there are other causes of hyperglycemia. Hyperglycemia can occur when you have diabetes, but it can also occur in other situations that you might not be as aware of, such as: Diabetes  If you have diabetes and are having problems controlling your blood glucose, hyperglycemia could occur because of some of the following reasons:  Not following your meal plan.  Not taking your diabetes medications or not taking it properly.  Exercising less or doing less activity than you normally do.  Being sick. Pre-diabetes  This cannot be ignored. Before people develop Type 2 diabetes, they almost always have "pre-diabetes." This is when your blood glucose levels are higher than normal, but not yet high enough to be diagnosed as diabetes. Research has shown that some long-term damage to the body, especially the heart and circulatory system, may already be occurring during pre-diabetes. If you take action to manage your blood glucose when you have pre-diabetes, you may delay or prevent Type 2 diabetes from developing. Stress  If you have diabetes, you may be "diet" controlled or on oral medications or insulin to control your diabetes. However, you may find that your blood glucose is higher than usual in the hospital whether you have diabetes or not. This is often referred to as "stress hyperglycemia." Stress can elevate your blood glucose. This happens because of hormones put out by the body during times of stress. If stress has been the cause of your high blood glucose, it can be followed regularly by your caregiver. That way he/she can make sure your  hyperglycemia does not continue to get worse or progress to diabetes. Steroids  Steroids are medications that act on the infection fighting system (immune system) to block inflammation or infection. One side effect can be a rise in blood glucose. Most people can produce enough extra insulin to allow for this rise, but for those who cannot, steroids make blood glucose levels go even higher. It is not unusual for steroid treatments to "uncover" diabetes that is developing. It is not always possible to determine if the hyperglycemia will go away after the steroids are stopped. A special blood test called an A1c is sometimes done to determine if your blood glucose was elevated before the steroids were started. SYMPTOMS  Thirsty.  Frequent urination.  Dry mouth.  Blurred vision.  Tired or fatigue.  Weakness.  Sleepy.  Tingling in feet or leg. DIAGNOSIS  Diagnosis is made by monitoring blood glucose in one or all of the following ways:  A1c test. This is a chemical found in your blood.  Fingerstick blood glucose monitoring.  Laboratory results. TREATMENT  First, knowing the  cause of the hyperglycemia is important before the hyperglycemia can be treated. Treatment may include, but is not be limited to:  Education.  Change or adjustment in medications.  Change or adjustment in meal plan.  Treatment for an illness, infection, etc.  More frequent blood glucose monitoring.  Change in exercise plan.  Decreasing or stopping steroids.  Lifestyle changes. HOME CARE INSTRUCTIONS   Test your blood glucose as directed.  Exercise regularly. Your caregiver will give you instructions about exercise. Pre-diabetes or diabetes which comes on with stress is helped by exercising.  Eat wholesome, balanced meals. Eat often and at regular, fixed times. Your caregiver or nutritionist will give you a meal plan to guide your sugar intake.  Being at an ideal weight is important. If needed,  losing as little as 10 to 15 pounds may help improve blood glucose levels. SEEK MEDICAL CARE IF:   You have questions about medicine, activity, or diet.  You continue to have symptoms (problems such as increased thirst, urination, or weight gain). SEEK IMMEDIATE MEDICAL CARE IF:   You are vomiting or have diarrhea.  Your breath smells fruity.  You are breathing faster or slower.  You are very sleepy or incoherent.  You have numbness, tingling, or pain in your feet or hands.  You have chest pain.  Your symptoms get worse even though you have been following your caregiver's orders.  If you have any other questions or concerns. Document Released: 06/07/2001 Document Revised: 03/05/2012 Document Reviewed: 04/09/2012 [facility] Patient Information 2015 [place], [place]. This information is not intended to replace advice given to you by your health care provider. Make sure you discuss any questions you have with your health care provider.

## 2015-03-18 NOTE — ED Notes (Signed)
NAD at this time. Pt is stable and leaving with her daughter. 

## 2015-03-18 NOTE — ED Notes (Signed)
Pt left for XRay 

## 2015-03-18 NOTE — ED Notes (Signed)
Pt returned from bathroom. Monitored by pulse ox, bp cuff, and 5-lead.

## 2015-03-18 NOTE — ED Provider Notes (Signed)
CSN: [id number]     Arrival date & time 03/18/15  1154 History   First MD Initiated Contact with Patient 03/18/15 1207     Chief Complaint  Patient presents with  . Hyperglycemia  . Back Pain     (Consider location/radiation/quality/duration/timing/severity/associated sxs/prior Treatment) HPI Comments: Patient is a 54 year old female with history of type 1 diabetes. She presents with complaints of pain in her upper back, left arm, and low back for the past several days. She thought she had a urinary tract infection and went to urgent care yesterday. They told her she had ketones in her urine and possibly a UTI, then discharged her with Bactrim. She returns today with ongoing discomfort. Her sugars at home have been in the 2 to 300s. She denies any frequent urination or excessive thirst. She denies any specific chest pain, shortness of breath, nausea, or diaphoresis.  Patient is a 54 y.o. female presenting with hyperglycemia and back pain. The history is provided by the patient.  Hyperglycemia Severity:  Moderate Onset quality:  Sudden Duration:  3 days Timing:  Constant Progression:  Worsening Chronicity:  New Diabetes status:  Controlled with insulin Context: not change in medication and not new diabetes diagnosis   Relieved by:  Nothing Ineffective treatments:  None tried Back Pain   Past Medical History  Diagnosis Date  . Diabetes mellitus   . Hypertension   . Arthritis   . Family history of anesthesia complication     Patients son has difficulty waking up after anesthesia  . Hypothyroidism   . Anxiety   . Depression   . Cancer 2009    lumpectomy  . Hyperlipemia   . GERD (gastroesophageal reflux disease)    Past Surgical History  Procedure Laterality Date  . Appendectomy    . Shoulder arthroscopy Left     cleaned out joint  . Cesarean section    . Dilation and curettage of uterus    . Breast lumpectomy Left   . Colonoscopy    . Total knee arthroplasty Right  08/15/2014    Procedure: RIGHT TOTAL KNEE ARTHROPLASTY;  Surgeon: Yvette Rack., MD;  Location: [facility];  Service: Orthopedics;  Laterality: Right;  . Total knee arthroplasty Left 01/23/2015    Procedure: TOTAL KNEE ARTHROPLASTY;  Surgeon: Yvette Rack., MD;  Location: [facility];  Service: Orthopedics;  Laterality: Left;   Family History  Problem Relation Age of Onset  . Colon cancer Neg Hx   . Rectal cancer Neg Hx   . Stomach cancer Neg Hx   . Cancer Paternal Grandmother     NOS  . Pancreatic cancer Paternal Aunt   . Cancer Paternal Aunt     NOS   History  Substance Use Topics  . Smoking status: Former Research scientist (life sciences)  . Smokeless tobacco: Never Used  . Alcohol Use: No   OB History    No data available     Review of Systems  Musculoskeletal: Positive for back pain.  All other systems reviewed and are negative.     Allergies  Sulfonamide derivatives  Home Medications   Prior to Admission medications   Medication Sig Start Date End Date Taking? Authorizing Provider  amLODipine (NORVASC) 10 MG tablet Take 10 mg by mouth every morning.     Historical Provider, MD  atorvastatin (LIPITOR) 20 MG tablet Take 20 mg by mouth every morning.    Historical Provider, MD  buPROPion (WELLBUTRIN) 100 MG tablet Take 300 mg by mouth  daily.    Historical Provider, MD  FLUoxetine (PROZAC) 20 MG tablet Take 40 mg by mouth daily.    Historical Provider, MD  insulin glargine (LANTUS) 100 UNIT/ML injection Inject 24 Units into the skin at bedtime.     Historical Provider, MD  Liraglutide (VICTOZA) 18 MG/3ML SOPN Inject 1.8 mg into the skin daily.    Historical Provider, MD  losartan (COZAAR) 100 MG tablet Take 100 mg by mouth every morning.    Historical Provider, MD  metoprolol succinate (TOPROL-XL) 50 MG 24 hr tablet Take 50 mg by mouth every morning. Take with or immediately following a meal.    Historical Provider, MD  omeprazole (PRILOSEC) 20 MG capsule Take 20 mg by mouth every morning.      Historical Provider, MD  Oxycodone HCl 10 MG TABS Take 10 mg by mouth every 6 (six) hours. Dosage to be increased 01/22/14 to 15 mg    Historical Provider, MD  pioglitazone (ACTOS) 15 MG tablet Take 15 mg by mouth every morning.    Historical Provider, MD  rivaroxaban (XARELTO) 10 MG TABS tablet Take 1 tablet (10 mg total) by mouth daily. 01/23/15   Joshua Chadwell, PA-C  topiramate (TOPAMAX) 100 MG tablet Take 100 mg by mouth at bedtime.    Historical Provider, MD   BP 160/80 mmHg  Pulse 74  Temp(Src) 98.5 F (36.9 C) (Oral)  Resp 16  SpO2 99% Physical Exam  Constitutional: She is oriented to person, place, and time. She appears well-developed and well-nourished. No distress.  HENT:  Head: Normocephalic and atraumatic.  Neck: Normal range of motion. Neck supple.  Cardiovascular: Normal rate and regular rhythm.  Exam reveals no gallop and no friction rub.   No murmur heard. Pulmonary/Chest: Effort normal and breath sounds normal. No respiratory distress. She has no wheezes.  Abdominal: Soft. Bowel sounds are normal. She exhibits no distension. There is no tenderness.  Musculoskeletal: Normal range of motion.  Neurological: She is alert and oriented to person, place, and time.  Skin: Skin is warm and dry. She is not diaphoretic.  Nursing note and vitals reviewed.   ED Course  Procedures (including critical care time) Labs Review Labs Reviewed  CBG MONITORING, ED - Abnormal; Notable for the following:    Glucose-Capillary 214 (*)    All other components within normal limits  CBC  COMPREHENSIVE METABOLIC PANEL  URINALYSIS, ROUTINE W REFLEX MICROSCOPIC  TROPONIN I    Imaging Review No results found.   EKG Interpretation   Date/Time:  Wednesday March 18 2015 12:41:59 EDT Ventricular Rate:  73 PR Interval:  137 QRS Duration: 90 QT Interval:  393 QTC Calculation: 433 R Axis:   79 Text Interpretation:  Sinus rhythm Borderline repolarization abnormality  Confirmed by Beau Fanny   MD, Conda Wannamaker ([id number]) on 03/18/2015 2:14:29 PM      MDM   Final diagnoses:  None    Patient is a 54 year old female with history of type 1 diabetes. She presents for evaluation of pain in her upper and lower back, left arm, and lower abdomen. She thought maybe that she had a urinary tract infection and was seen yesterday by her primary Dr. she was given Macrobid but is not feeling better.  Her workup today reveals no evidence for a urinary tract infection, sugar slightly in excess of 200, electrolytes that do not reflect DKA.  She appears clinically well and I believe is appropriate for discharge.     Veryl Speak, MD 03/18/15 1455

## 2015-03-18 NOTE — ED Notes (Signed)
Pt reports not feeling well. Pt has pain to her back and entire left side of her body. Went to [facility] yesterday thinking she had possible UTI, pt was told she has ketones in her urine. cbg 311 pta.

## 2015-03-19 ENCOUNTER — Ambulatory Visit: Payer: Medicare Other | Admitting: Physical Therapy

## 2015-03-19 ENCOUNTER — Encounter: Payer: Self-pay | Admitting: Physical Therapy

## 2015-03-19 DIAGNOSIS — Z4789 Encounter for other orthopedic aftercare: Secondary | ICD-10-CM | POA: Diagnosis not present

## 2015-03-19 DIAGNOSIS — M25662 Stiffness of left knee, not elsewhere classified: Secondary | ICD-10-CM

## 2015-03-19 DIAGNOSIS — M25562 Pain in left knee: Secondary | ICD-10-CM

## 2015-03-19 NOTE — Therapy (Signed)
[address] [place], [place], [address] Phone: [phone]   Fax:  [phone]  Physical Therapy Treatment  Patient Details  Name: Morgan Taylor MRN: [id number] Date of Birth: 17-Jun-1961 Referring Provider:  Earlie Server, MD  Encounter Date: 03/19/2015      PT End of Session - 03/19/15 1124    PT Start Time 1023   PT Stop Time 1122   PT Time Calculation (min) 59 min   Activity Tolerance Patient tolerated treatment well   Behavior During Therapy [facility] for tasks assessed/performed      Past Medical History  Diagnosis Date  . Diabetes mellitus   . Hypertension   . Arthritis   . Family history of anesthesia complication     Patients son has difficulty waking up after anesthesia  . Hypothyroidism   . Anxiety   . Depression   . Cancer 2009    lumpectomy  . Hyperlipemia   . GERD (gastroesophageal reflux disease)     Past Surgical History  Procedure Laterality Date  . Appendectomy    . Shoulder arthroscopy Left     cleaned out joint  . Cesarean section    . Dilation and curettage of uterus    . Breast lumpectomy Left   . Colonoscopy    . Total knee arthroplasty Right 08/15/2014    Procedure: RIGHT TOTAL KNEE ARTHROPLASTY;  Surgeon: Yvette Rack., MD;  Location: [place];  Service: Orthopedics;  Laterality: Right;  . Total knee arthroplasty Left 01/23/2015    Procedure: TOTAL KNEE ARTHROPLASTY;  Surgeon: Yvette Rack., MD;  Location: [place];  Service: Orthopedics;  Laterality: Left;    There were no vitals filed for this visit.  Visit Diagnosis:  Knee stiffness, left  Left knee pain  Stiffness of knee joint, left  Knee pain, acute, left      Subjective Assessment - 03/19/15 1027    Symptoms It kinda hurts today.   Currently in Pain? Yes   Pain Score 5    Multiple Pain Sites No                       [facility] Adult PT Treatment/Exercise - 03/19/15 0001    Balance   Balance Assessed Yes   Static Standing Balance   Static Standing - Balance Support No upper extremity supported   Static Standing - Level of Assistance 5: Stand by assistance   Single Leg Stance - Right Leg 10   Single Leg Stance - Left Leg 10   Tandem Stance - Right Leg 3   Tandem Stance - Left Leg 3   Dynamic Standing Balance   Dynamic Standing - Balance Support No upper extremity supported   Dynamic Standing - Level of Assistance 5: Stand by assistance  SLS with kicks   Dynamic Standing - Balance Activities Ball toss;Head turns;Other (comment)  [facility] stand; ball toss   Exercises   Exercises Knee/Hip   Knee/Hip Exercises: Aerobic   Stationary Bike 6 minutes   Elliptical 6 minutes   Knee/Hip Exercises: Machines for Strengthening   Cybex Knee Extension 15# 2x15   Cybex Knee Flexion 25# 2x15                  PT Short Term Goals - 02/25/15 1152    PT SHORT TERM GOAL #1   Title independent with HEP   Time 4   Period Weeks  Status Achieved           PT Long Term Goals - 03/10/15 1037    PT LONG TERM GOAL #1   Title decrease pain 50%   Time 8   Period Weeks   Status Achieved   PT LONG TERM GOAL #3   Title independent with HEP   Time 8   Period Weeks   Status On-going   PT LONG TERM GOAL #4   Title independent with RICE   Time 8   Status Achieved               Plan - 03/19/15 1126    Clinical Impression Statement Balance is improving.  LOB is easy when initially attempting an activity, but adaptation is becoming more rapid.   Pt will benefit from skilled therapeutic intervention in order to improve on the following deficits Abnormal gait;Decreased activity tolerance;Decreased balance;Decreased mobility;Decreased endurance;Decreased range of motion;Decreased scar mobility;Decreased strength;Increased edema;Difficulty walking;Impaired flexibility   Rehab Potential Good   PT Frequency 3x / week   PT Duration 6 weeks   PT Treatment/Interventions  Cryotherapy;Electrical Stimulation;Functional mobility training;Stair training;Gait training;Therapeutic exercise;Balance training;Neuromuscular re-education;Manual techniques;Passive range of motion;Patient/family education   PT Next Visit Plan Continue to strengthen.    Consulted and Agree with Plan of Care Patient        Problem List Patient Active Problem List   Diagnosis Date Noted  . primary Osteoarthritis of left knee 01/23/2015  . Osteoarthritis of right knee 08/15/2014  . Cancer of lower-outer quadrant of female breast 01/23/2012  . SNORING 05/03/2007  . HYPERGLYCEMIA 05/03/2007  . PHARYNGITIS, ACUTE 02/22/2007  . HYPERPLASIA, ENDOMETRIAL NOS 02/22/2007  . DYSMENORRHEA 10/18/2006  . DEGENERATIVE JOINT DISEASE 10/18/2006  . INSOMNIA 10/18/2006  . CHEST PAIN 10/18/2006  . OBESITY 10/05/2006  . ANXIETY 10/05/2006  . HYPERTENSION 10/05/2006  . GASTROENTERITIS 10/05/2006  . VAGINAL BLEEDING 10/05/2006  . MENORRHAGIA, PERIMENOPAUSAL 10/05/2006  . PATELLO-FEMORAL SYNDROME 10/05/2006    Ashani Pumphrey PTA 03/19/2015, 11:29 AM  [address] [place], [place], [address] Phone: [phone]   Fax:  [phone]

## 2015-03-24 ENCOUNTER — Encounter: Payer: Self-pay | Admitting: Physical Therapy

## 2015-03-24 ENCOUNTER — Ambulatory Visit: Payer: Medicare Other | Admitting: Physical Therapy

## 2015-03-24 DIAGNOSIS — M25662 Stiffness of left knee, not elsewhere classified: Secondary | ICD-10-CM

## 2015-03-24 DIAGNOSIS — Z4789 Encounter for other orthopedic aftercare: Secondary | ICD-10-CM | POA: Diagnosis not present

## 2015-03-24 DIAGNOSIS — M25562 Pain in left knee: Secondary | ICD-10-CM

## 2015-03-24 NOTE — Therapy (Signed)
[address] [place], [place], [address] Phone: [phone]   Fax:  [phone]  Physical Therapy Treatment  Patient Details  Name: Morgan Taylor MRN: [id number] Date of Birth: [birth year] Referring Provider:  Earlie Server, MD  Encounter Date: 03/24/2015      PT End of Session - 03/24/15 1149    Visit Number 10   Number of Visits 24   Date for PT Re-Evaluation 04/13/15   PT Start Time 1101   PT Stop Time 1149   PT Time Calculation (min) 48 min   Activity Tolerance Patient tolerated treatment well   Behavior During Therapy [facility] for tasks assessed/performed      Past Medical History  Diagnosis Date  . Diabetes mellitus   . Hypertension   . Arthritis   . Family history of anesthesia complication     Patients son has difficulty waking up after anesthesia  . Hypothyroidism   . Anxiety   . Depression   . Cancer 2009    lumpectomy  . Hyperlipemia   . GERD (gastroesophageal reflux disease)     Past Surgical History  Procedure Laterality Date  . Appendectomy    . Shoulder arthroscopy Left     cleaned out joint  . Cesarean section    . Dilation and curettage of uterus    . Breast lumpectomy Left   . Colonoscopy    . Total knee arthroplasty Right 08/15/2014    Procedure: RIGHT TOTAL KNEE ARTHROPLASTY;  Surgeon: Yvette Rack., MD;  Location: [place];  Service: Orthopedics;  Laterality: Right;  . Total knee arthroplasty Left 01/23/2015    Procedure: TOTAL KNEE ARTHROPLASTY;  Surgeon: Yvette Rack., MD;  Location: [place];  Service: Orthopedics;  Laterality: Left;    There were no vitals filed for this visit.  Visit Diagnosis:  Knee stiffness, left  Left knee pain  Stiffness of knee joint, left  Knee pain, acute, left      Subjective Assessment - 03/24/15 1102    Symptoms I'm ok.   Currently in Pain? Yes   Pain Score 3             [facility] PT Assessment - 03/24/15 0001    ROM /  Strength   AROM / PROM / Strength AROM   AROM   Right/Left Knee Left   Left Knee Extension 5   Left Knee Flexion 97                   [facility] Adult PT Treatment/Exercise - 03/24/15 0001    Exercises   Exercises Knee/Hip   Knee/Hip Exercises: Aerobic   Stationary Bike 6 minutes   Elliptical 6 minutes  Nustep level 6   Knee/Hip Exercises: Machines for Strengthening   Cybex Knee Extension 15# 2x15   Cybex Knee Flexion 25# 2x15   Manual Therapy   Manual Therapy Passive ROM   Passive ROM PROM of the left knee, hamstring, quad                  PT Short Term Goals - 02/25/15 1152    PT SHORT TERM GOAL #1   Title independent with HEP   Time 4   Period Weeks   Status Achieved           PT Long Term Goals - 03/24/15 1150    PT LONG TERM GOAL #1   Title decrease pain 50%  Time 8   Period Weeks   Status Achieved   PT LONG TERM GOAL #2   Title increase ROM to 10-110 degrees flexion   Time 8   Period Weeks   Status On-going   PT LONG TERM GOAL #3   Title independent with HEP   Time 8   Period Weeks   Status On-going   PT LONG TERM GOAL #4   Title independent with RICE   Time 8   Period Weeks   Status Achieved               Plan - 03/24/15 1148    Clinical Impression Statement Tight in bilateral hamstrings, hip flexors, and quads.   Pt will benefit from skilled therapeutic intervention in order to improve on the following deficits Abnormal gait;Decreased activity tolerance;Decreased balance;Decreased mobility;Decreased endurance;Decreased range of motion;Decreased scar mobility;Decreased strength;Increased edema;Difficulty walking;Impaired flexibility   Rehab Potential Good   PT Frequency 3x / week   PT Duration 6 weeks   PT Treatment/Interventions Cryotherapy;Electrical Stimulation;Functional mobility training;Stair training;Gait training;Therapeutic exercise;Balance training;Neuromuscular re-education;Manual techniques;Passive range of  motion;Patient/family education   PT Next Visit Plan Continue to strengthen and work ROM.   Consulted and Agree with Plan of Care Patient        Problem List Patient Active Problem List   Diagnosis Date Noted  . primary Osteoarthritis of left knee 01/23/2015  . Osteoarthritis of right knee 08/15/2014  . Cancer of lower-outer quadrant of female breast 01/23/2012  . SNORING 05/03/2007  . HYPERGLYCEMIA 05/03/2007  . PHARYNGITIS, ACUTE 02/22/2007  . HYPERPLASIA, ENDOMETRIAL NOS 02/22/2007  . DYSMENORRHEA 10/18/2006  . DEGENERATIVE JOINT DISEASE 10/18/2006  . INSOMNIA 10/18/2006  . CHEST PAIN 10/18/2006  . OBESITY 10/05/2006  . ANXIETY 10/05/2006  . HYPERTENSION 10/05/2006  . GASTROENTERITIS 10/05/2006  . VAGINAL BLEEDING 10/05/2006  . MENORRHAGIA, PERIMENOPAUSAL 10/05/2006  . PATELLO-FEMORAL SYNDROME 10/05/2006    Keziyah Kneale PTA 03/24/2015, 11:52 AM  [address] [place], [place], [address] Phone: [phone]   Fax:  [phone]

## 2015-03-27 ENCOUNTER — Ambulatory Visit: Payer: Medicare Other | Attending: Orthopedic Surgery | Admitting: Physical Therapy

## 2015-03-27 ENCOUNTER — Encounter: Payer: Self-pay | Admitting: Physical Therapy

## 2015-03-27 DIAGNOSIS — M25662 Stiffness of left knee, not elsewhere classified: Secondary | ICD-10-CM | POA: Insufficient documentation

## 2015-03-27 DIAGNOSIS — Z96652 Presence of left artificial knee joint: Secondary | ICD-10-CM | POA: Insufficient documentation

## 2015-03-27 DIAGNOSIS — Z4789 Encounter for other orthopedic aftercare: Secondary | ICD-10-CM | POA: Diagnosis not present

## 2015-03-27 DIAGNOSIS — M25562 Pain in left knee: Secondary | ICD-10-CM | POA: Insufficient documentation

## 2015-03-27 NOTE — Therapy (Signed)
[place] [address] [place], [place], [address] Phone: [phone]   Fax:  [phone]  Physical Therapy Treatment  Patient Details  Name: Morgan Taylor MRN: [id number] Date of Birth: 1961/04/11 Referring Provider:  Earlie Server, MD  Encounter Date: 03/27/2015      PT End of Session - 03/27/15 1142    Visit Number 11   Number of Visits 24   Date for PT Re-Evaluation 04/13/15   PT Start Time 1100   PT Stop Time 1150   PT Time Calculation (min) 50 min   Activity Tolerance Patient tolerated treatment well;Patient limited by pain   Behavior During Therapy [facility] for tasks assessed/performed      Past Medical History  Diagnosis Date  . Diabetes mellitus   . Hypertension   . Arthritis   . Family history of anesthesia complication     Patients son has difficulty waking up after anesthesia  . Hypothyroidism   . Anxiety   . Depression   . Cancer 2009    lumpectomy  . Hyperlipemia   . GERD (gastroesophageal reflux disease)     Past Surgical History  Procedure Laterality Date  . Appendectomy    . Shoulder arthroscopy Left     cleaned out joint  . Cesarean section    . Dilation and curettage of uterus    . Breast lumpectomy Left   . Colonoscopy    . Total knee arthroplasty Right 08/15/2014    Procedure: RIGHT TOTAL KNEE ARTHROPLASTY;  Surgeon: Yvette Rack., MD;  Location: [place];  Service: Orthopedics;  Laterality: Right;  . Total knee arthroplasty Left 01/23/2015    Procedure: TOTAL KNEE ARTHROPLASTY;  Surgeon: Yvette Rack., MD;  Location: [place];  Service: Orthopedics;  Laterality: Left;    There were no vitals filed for this visit.  Visit Diagnosis:  Knee stiffness, left  Left knee pain  Stiffness of knee joint, left  Knee pain, acute, left      Subjective Assessment - 03/27/15 1100    Symptoms It hurts today.   Currently in Pain? Yes   Pain Score 6    Pain Location Knee   Pain  Orientation Left                       [facility] Adult PT Treatment/Exercise - 03/27/15 0001    Exercises   Exercises Knee/Hip   Knee/Hip Exercises: Aerobic   Stationary Bike 6 minutes   Elliptical 4 minutes  43fwd/2bk   Knee/Hip Exercises: Machines for Strengthening   Cybex Leg Press no wt for ROM   Knee/Hip Exercises: Standing   Terminal Knee Extension 2 sets;15 reps   Walking with Sports Cord 30# pulleys   Modalities   Modalities Cryotherapy;Electrical Stimulation   Cryotherapy   Number Minutes Cryotherapy 15 Minutes   Cryotherapy Location Knee   Type of Cryotherapy Ice pack   Electrical Stimulation   Electrical Stimulation Location left knee   Electrical Stimulation Parameters IFC   Electrical Stimulation Goals Pain                  PT Short Term Goals - 02/25/15 1152    PT SHORT TERM GOAL #1   Title independent with HEP   Time 4   Period Weeks   Status Achieved           PT Long Term Goals - 03/24/15 1150  PT LONG TERM GOAL #1   Title decrease pain 50%   Time 8   Period Weeks   Status Achieved   PT LONG TERM GOAL #2   Title increase ROM to 10-110 degrees flexion   Time 8   Period Weeks   Status On-going   PT LONG TERM GOAL #3   Title independent with HEP   Time 8   Period Weeks   Status On-going   PT LONG TERM GOAL #4   Title independent with RICE   Time 8   Period Weeks   Status Achieved               Problem List Patient Active Problem List   Diagnosis Date Noted  . primary Osteoarthritis of left knee 01/23/2015  . Osteoarthritis of right knee 08/15/2014  . Cancer of lower-outer quadrant of female breast 01/23/2012  . SNORING 05/03/2007  . HYPERGLYCEMIA 05/03/2007  . PHARYNGITIS, ACUTE 02/22/2007  . HYPERPLASIA, ENDOMETRIAL NOS 02/22/2007  . DYSMENORRHEA 10/18/2006  . DEGENERATIVE JOINT DISEASE 10/18/2006  . INSOMNIA 10/18/2006  . CHEST PAIN 10/18/2006  . OBESITY 10/05/2006  . ANXIETY 10/05/2006  .  HYPERTENSION 10/05/2006  . GASTROENTERITIS 10/05/2006  . VAGINAL BLEEDING 10/05/2006  . MENORRHAGIA, PERIMENOPAUSAL 10/05/2006  . PATELLO-FEMORAL SYNDROME 10/05/2006    Ardena Gangl PTA 03/27/2015, 12:01 PM  [address] [place], [place], [address] Phone: [phone]   Fax:  [phone]

## 2015-03-31 ENCOUNTER — Ambulatory Visit: Payer: Medicare Other | Admitting: Physical Therapy

## 2015-03-31 ENCOUNTER — Encounter: Payer: Self-pay | Admitting: Physical Therapy

## 2015-03-31 DIAGNOSIS — M25662 Stiffness of left knee, not elsewhere classified: Secondary | ICD-10-CM

## 2015-03-31 DIAGNOSIS — M25562 Pain in left knee: Secondary | ICD-10-CM

## 2015-03-31 DIAGNOSIS — Z4789 Encounter for other orthopedic aftercare: Secondary | ICD-10-CM | POA: Diagnosis not present

## 2015-03-31 NOTE — Therapy (Signed)
[place] [address] [place], [place], [address] Phone: [phone]   Fax:  [phone]  Physical Therapy Treatment  Patient Details  Name: Morgan Taylor MRN: [id number] Date of Birth: 02-14-1961 Referring Provider:  Earlie Server, MD  Encounter Date: 03/31/2015      PT End of Session - 03/31/15 1012    Visit Number 12   Number of Visits 24   Date for PT Re-Evaluation 04/13/15   PT Start Time 0932   PT Stop Time 1012   PT Time Calculation (min) 40 min   Activity Tolerance Patient tolerated treatment well   Behavior During Therapy [facility] for tasks assessed/performed      Past Medical History  Diagnosis Date  . Diabetes mellitus   . Hypertension   . Arthritis   . Family history of anesthesia complication     Patients son has difficulty waking up after anesthesia  . Hypothyroidism   . Anxiety   . Depression   . Cancer 2009    lumpectomy  . Hyperlipemia   . GERD (gastroesophageal reflux disease)     Past Surgical History  Procedure Laterality Date  . Appendectomy    . Shoulder arthroscopy Left     cleaned out joint  . Cesarean section    . Dilation and curettage of uterus    . Breast lumpectomy Left   . Colonoscopy    . Total knee arthroplasty Right 08/15/2014    Procedure: RIGHT TOTAL KNEE ARTHROPLASTY;  Surgeon: Yvette Rack., MD;  Location: [place];  Service: Orthopedics;  Laterality: Right;  . Total knee arthroplasty Left 01/23/2015    Procedure: TOTAL KNEE ARTHROPLASTY;  Surgeon: Yvette Rack., MD;  Location: [place];  Service: Orthopedics;  Laterality: Left;    There were no vitals filed for this visit.  Visit Diagnosis:  Knee stiffness, left  Left knee pain  Stiffness of knee joint, left  Knee pain, acute, left      Subjective Assessment - 03/31/15 0934    Subjective I'm doing ok.   Currently in Pain? Yes   Pain Score 3             [facility] PT Assessment - 03/31/15 0001    ROM  / Strength   AROM / PROM / Strength AROM   AROM   AROM Assessment Site Knee   Right/Left Knee Left   Left Knee Extension 7   Left Knee Flexion 102                   [facility] Adult PT Treatment/Exercise - 03/31/15 0001    Exercises   Exercises Knee/Hip   Knee/Hip Exercises: Stretches   Active Hamstring Stretch 60 seconds;3 reps   Knee/Hip Exercises: Aerobic   Stationary Bike 6 minutes   Elliptical 6 minutes  incline 10; resistance 5   Knee/Hip Exercises: Machines for Strengthening   Cybex Knee Extension 15# 2x15   Cybex Knee Flexion 25# 2x15   Knee/Hip Exercises: Standing   Other Standing Knee Exercises manual treadmill push 2x15                  PT Short Term Goals - 02/25/15 1152    PT SHORT TERM GOAL #1   Title independent with HEP   Time 4   Period Weeks   Status Achieved           PT Long Term Goals - 03/24/15 1150  PT LONG TERM GOAL #1   Title decrease pain 50%   Time 8   Period Weeks   Status Achieved   PT LONG TERM GOAL #2   Title increase ROM to 10-110 degrees flexion   Time 8   Period Weeks   Status On-going   PT LONG TERM GOAL #3   Title independent with HEP   Time 8   Period Weeks   Status On-going   PT LONG TERM GOAL #4   Title independent with RICE   Time 8   Period Weeks   Status Achieved               Problem List Patient Active Problem List   Diagnosis Date Noted  . primary Osteoarthritis of left knee 01/23/2015  . Osteoarthritis of right knee 08/15/2014  . Cancer of lower-outer quadrant of female breast 01/23/2012  . SNORING 05/03/2007  . HYPERGLYCEMIA 05/03/2007  . PHARYNGITIS, ACUTE 02/22/2007  . HYPERPLASIA, ENDOMETRIAL NOS 02/22/2007  . DYSMENORRHEA 10/18/2006  . DEGENERATIVE JOINT DISEASE 10/18/2006  . INSOMNIA 10/18/2006  . CHEST PAIN 10/18/2006  . OBESITY 10/05/2006  . ANXIETY 10/05/2006  . HYPERTENSION 10/05/2006  . GASTROENTERITIS 10/05/2006  . VAGINAL BLEEDING 10/05/2006  .  MENORRHAGIA, PERIMENOPAUSAL 10/05/2006  . PATELLO-FEMORAL SYNDROME 10/05/2006    Zaylah Blecha PTA 03/31/2015, 10:38 AM  [address] [place] [address] [place], [place], [address] Phone: [phone]   Fax:  [phone]

## 2015-04-02 ENCOUNTER — Ambulatory Visit: Payer: Medicare Other | Admitting: Physical Therapy

## 2015-04-07 ENCOUNTER — Encounter: Payer: Self-pay | Admitting: Physical Therapy

## 2015-04-07 ENCOUNTER — Ambulatory Visit: Payer: Medicare Other | Admitting: Physical Therapy

## 2015-04-07 DIAGNOSIS — Z4789 Encounter for other orthopedic aftercare: Secondary | ICD-10-CM | POA: Diagnosis not present

## 2015-04-07 DIAGNOSIS — M25662 Stiffness of left knee, not elsewhere classified: Secondary | ICD-10-CM

## 2015-04-07 NOTE — Therapy (Signed)
[address] [place], [place], [address] Phone: [phone]   Fax:  [phone]  Physical Therapy Treatment  Patient Details  Name: Morgan Taylor MRN: [id number] Date of Birth: May 30, 1961 Referring Provider:  Earlie Server, MD  Encounter Date: 04/07/2015      PT End of Session - 04/07/15 1147    Visit Number 13   Number of Visits 24   Date for PT Re-Evaluation 04/13/15   Authorization Type Medicare/medicaid   PT Start Time 1100   PT Stop Time 1150   PT Time Calculation (min) 50 min      Past Medical History  Diagnosis Date  . Diabetes mellitus   . Hypertension   . Arthritis   . Family history of anesthesia complication     Patients son has difficulty waking up after anesthesia  . Hypothyroidism   . Anxiety   . Depression   . Cancer 2009    lumpectomy  . Hyperlipemia   . GERD (gastroesophageal reflux disease)     Past Surgical History  Procedure Laterality Date  . Appendectomy    . Shoulder arthroscopy Left     cleaned out joint  . Cesarean section    . Dilation and curettage of uterus    . Breast lumpectomy Left   . Colonoscopy    . Total knee arthroplasty Right 08/15/2014    Procedure: RIGHT TOTAL KNEE ARTHROPLASTY;  Surgeon: Yvette Rack., MD;  Location: [place];  Service: Orthopedics;  Laterality: Right;  . Total knee arthroplasty Left 01/23/2015    Procedure: TOTAL KNEE ARTHROPLASTY;  Surgeon: Yvette Rack., MD;  Location: [place];  Service: Orthopedics;  Laterality: Left;    There were no vitals filed for this visit.  Visit Diagnosis:  Knee stiffness, left      Subjective Assessment - 04/07/15 1100    Subjective rain has got me aching more sorry I missed last week I was sick   Currently in Pain? Yes   Pain Score 5    Pain Location Knee   Pain Orientation Left            [facility] PT Assessment - 04/07/15 0001    AROM   AROM Assessment Site Knee   Right/Left Knee Left    Left Knee Extension 4   Left Knee Flexion 108                   [facility] Adult PT Treatment/Exercise - 04/07/15 0001    Knee/Hip Exercises: Aerobic   Stationary Bike 6 minutes   Elliptical 6 minutes  incline 10; resistance 5   Knee/Hip Exercises: Machines for Strengthening   Cybex Knee Extension 15# 2x15   Cybex Knee Flexion 25# 2x15   Knee/Hip Exercises: Standing   Wall Squat 2 sets;10 reps  with ball   Other Standing Knee Exercises manual treadmill push 2x15  and pull   Other Standing Knee Exercises stool scoot  2 laps fwd and backward   Manual Therapy   Manual Therapy Joint mobilization  with belt                  PT Short Term Goals - 02/25/15 1152    PT SHORT TERM GOAL #1   Title independent with HEP   Time 4   Period Weeks   Status Achieved           PT Long Term Goals - 04/07/15  [place] #1   Title decrease pain 50%   Status Achieved   PT LONG TERM GOAL #2   Title increase ROM to 10-110 degrees flexion   Baseline 4-108 improving   Status On-going   PT LONG TERM GOAL #3   Title independent with HEP   Status On-going   PT LONG TERM GOAL #4   Title independent with RICE   Status Achieved               Plan - 04/07/15 1148    Clinical Impression Statement pt with improved ROM after MT and felt better after treatment so declined modalities        Problem List Patient Active Problem List   Diagnosis Date Noted  . primary Osteoarthritis of left knee 01/23/2015  . Osteoarthritis of right knee 08/15/2014  . Cancer of lower-outer quadrant of female breast 01/23/2012  . SNORING 05/03/2007  . HYPERGLYCEMIA 05/03/2007  . PHARYNGITIS, ACUTE 02/22/2007  . HYPERPLASIA, ENDOMETRIAL NOS 02/22/2007  . DYSMENORRHEA 10/18/2006  . DEGENERATIVE JOINT DISEASE 10/18/2006  . INSOMNIA 10/18/2006  . CHEST PAIN 10/18/2006  . OBESITY 10/05/2006  . ANXIETY 10/05/2006  . HYPERTENSION 10/05/2006  . GASTROENTERITIS  10/05/2006  . VAGINAL BLEEDING 10/05/2006  . MENORRHAGIA, PERIMENOPAUSAL 10/05/2006  . PATELLO-FEMORAL SYNDROME 10/05/2006    PAYSEUR,ANGIE PTA  04/07/2015, 11:50 AM  [address] [place], [place], [address] Phone: [phone]   Fax:  [phone]

## 2015-04-09 ENCOUNTER — Ambulatory Visit: Payer: Medicare Other | Admitting: Physical Therapy

## 2015-04-09 ENCOUNTER — Encounter: Payer: Self-pay | Admitting: Physical Therapy

## 2015-04-09 DIAGNOSIS — Z4789 Encounter for other orthopedic aftercare: Secondary | ICD-10-CM | POA: Diagnosis not present

## 2015-04-09 DIAGNOSIS — M25662 Stiffness of left knee, not elsewhere classified: Secondary | ICD-10-CM

## 2015-04-09 NOTE — Therapy (Signed)
[address] [place], [place], [address] Phone: [phone]   Fax:  [phone]  Physical Therapy Treatment  Patient Details  Name: Morgan Taylor MRN: [id number] Date of Birth: 14-Mar-1961 Referring Provider:  Earlie Server, MD  Encounter Date: 04/09/2015      PT End of Session - 04/09/15 1015    Visit Number 14   Number of Visits 24   Authorization Type Medicare/medicaid   PT Start Time 0930   PT Stop Time 1017   PT Time Calculation (min) 47 min      Past Medical History  Diagnosis Date  . Diabetes mellitus   . Hypertension   . Arthritis   . Family history of anesthesia complication     Patients son has difficulty waking up after anesthesia  . Hypothyroidism   . Anxiety   . Depression   . Cancer 2009    lumpectomy  . Hyperlipemia   . GERD (gastroesophageal reflux disease)     Past Surgical History  Procedure Laterality Date  . Appendectomy    . Shoulder arthroscopy Left     cleaned out joint  . Cesarean section    . Dilation and curettage of uterus    . Breast lumpectomy Left   . Colonoscopy    . Total knee arthroplasty Right 08/15/2014    Procedure: RIGHT TOTAL KNEE ARTHROPLASTY;  Surgeon: Yvette Rack., MD;  Location: [facility];  Service: Orthopedics;  Laterality: Right;  . Total knee arthroplasty Left 01/23/2015    Procedure: TOTAL KNEE ARTHROPLASTY;  Surgeon: Yvette Rack., MD;  Location: [facility];  Service: Orthopedics;  Laterality: Left;    There were no vitals filed for this visit.  Visit Diagnosis:  Knee stiffness, left      Subjective Assessment - 04/09/15 0927    Subjective stiffness is biggest complaint,feeling better overall   Currently in Pain? Yes   Pain Score 2    Pain Location Knee   Pain Orientation Left   Pain Descriptors / Indicators Tightness                       [facility] Adult PT Treatment/Exercise - 04/09/15 0001    High Level Balance   High  Level Balance Activities --  airex cone tapping bilateral LE without UE assistance   Knee/Hip Exercises: Aerobic   Stationary Bike 6 minutes   Elliptical 6 minutes  incline 10; resistance 5   Knee/Hip Exercises: Machines for Strengthening   Cybex Knee Extension 15# 2x15   Cybex Knee Flexion 25# 2x15   Cybex Leg Press 30# 3 sets 10  same with calf raises   Knee/Hip Exercises: Standing   Wall Squat 3 sets;10 reps  with ball   Other Standing Knee Exercises step up/over and reverse 6 inch box 2 sets 10 bilaterally   Manual Therapy   Manual Therapy Joint mobilization  with belt for flexion                  PT Short Term Goals - 02/25/15 1152    PT SHORT TERM GOAL #1   Title independent with HEP   Time 4   Period Weeks   Status Achieved           PT Long Term Goals - 04/07/15 1148    PT LONG TERM GOAL #1   Title decrease pain 50%   Status Achieved  PT LONG TERM GOAL #2   Title increase ROM to 10-110 degrees flexion   Baseline 4-108 improving   Status On-going   PT LONG TERM GOAL #3   Title independent with HEP   Status On-going   PT LONG TERM GOAL #4   Title independent with RICE   Status Achieved               Plan - 04/09/15 1015    Clinical Impression Statement pt is getting better with increased func activities, improved strength/func and ROM   PT Next Visit Plan write MD note/recert vs D/C        Problem List Patient Active Problem List   Diagnosis Date Noted  . primary Osteoarthritis of left knee 01/23/2015  . Osteoarthritis of right knee 08/15/2014  . Cancer of lower-outer quadrant of female breast 01/23/2012  . SNORING 05/03/2007  . HYPERGLYCEMIA 05/03/2007  . PHARYNGITIS, ACUTE 02/22/2007  . HYPERPLASIA, ENDOMETRIAL NOS 02/22/2007  . DYSMENORRHEA 10/18/2006  . DEGENERATIVE JOINT DISEASE 10/18/2006  . INSOMNIA 10/18/2006  . CHEST PAIN 10/18/2006  . OBESITY 10/05/2006  . ANXIETY 10/05/2006  . HYPERTENSION 10/05/2006  .  GASTROENTERITIS 10/05/2006  . VAGINAL BLEEDING 10/05/2006  . MENORRHAGIA, PERIMENOPAUSAL 10/05/2006  . PATELLO-FEMORAL SYNDROME 10/05/2006    Jahnessa Vanduyn,ANGIE PTA  04/09/2015, 10:26 AM  [address] [place], [place], [address] Phone: [phone]   Fax:  [phone]

## 2015-04-14 ENCOUNTER — Ambulatory Visit: Payer: Medicare Other | Admitting: Physical Therapy

## 2015-04-14 ENCOUNTER — Encounter: Payer: Self-pay | Admitting: Physical Therapy

## 2015-04-14 DIAGNOSIS — M25562 Pain in left knee: Secondary | ICD-10-CM

## 2015-04-14 DIAGNOSIS — M25662 Stiffness of left knee, not elsewhere classified: Secondary | ICD-10-CM

## 2015-04-14 DIAGNOSIS — Z4789 Encounter for other orthopedic aftercare: Secondary | ICD-10-CM | POA: Diagnosis not present

## 2015-04-14 NOTE — Therapy (Signed)
[address] [place], [place], [address] Phone: [phone]   Fax:  [phone]  Physical Therapy Treatment  Patient Details  Name: Morgan Taylor MRN: [id number] Date of Birth: January 29, 1961 Referring Provider:  Earlie Server, MD  Encounter Date: 04/14/2015      PT End of Session - 04/14/15 1142    Visit Number 15   PT Start Time 1100   PT Stop Time 8937   PT Time Calculation (min) 45 min      Past Medical History  Diagnosis Date  . Diabetes mellitus   . Hypertension   . Arthritis   . Family history of anesthesia complication     Patients son has difficulty waking up after anesthesia  . Hypothyroidism   . Anxiety   . Depression   . Cancer 2009    lumpectomy  . Hyperlipemia   . GERD (gastroesophageal reflux disease)     Past Surgical History  Procedure Laterality Date  . Appendectomy    . Shoulder arthroscopy Left     cleaned out joint  . Cesarean section    . Dilation and curettage of uterus    . Breast lumpectomy Left   . Colonoscopy    . Total knee arthroplasty Right 08/15/2014    Procedure: RIGHT TOTAL KNEE ARTHROPLASTY;  Surgeon: Yvette Rack., MD;  Location: [facility];  Service: Orthopedics;  Laterality: Right;  . Total knee arthroplasty Left 01/23/2015    Procedure: TOTAL KNEE ARTHROPLASTY;  Surgeon: Yvette Rack., MD;  Location: [facility];  Service: Orthopedics;  Laterality: Left;    There were no vitals filed for this visit.  Visit Diagnosis:  Knee stiffness, left  Left knee pain      Subjective Assessment - 04/14/15 1058    Subjective still stiff but over all 90% better   Currently in Pain? Yes   Pain Score 2    Pain Location Knee   Pain Orientation Left   Pain Descriptors / Indicators Tightness            [facility] PT Assessment - 04/14/15 0001    ROM / Strength   AROM / PROM / Strength AROM   AROM   AROM Assessment Site Knee   Right/Left Knee Left   Left Knee  Extension 0   Left Knee Flexion 110   Strength   Overall Strength --  5/5                     [facility] Adult PT Treatment/Exercise - 04/14/15 0001    Ambulation/Gait   Ambulation/Gait --  gait without AD, all terrains mod I no gait deficeits noted   Stairs --  up and down 2 flights of steps without railing step over ste   Knee/Hip Exercises: Aerobic   Stationary Bike 6 minutes   Elliptical 6 minutes  incline 10; resistance 5   Knee/Hip Exercises: Machines for Strengthening   Cybex Knee Extension 15# 2x15   Cybex Knee Flexion 35# 3 set 10   Cybex Leg Press 40# 3 sets 10  same with calf raises   Knee/Hip Exercises: Plyometrics   Other Plyometric Exercises walking lunges   Other Plyometric Exercises SL with ball toss                PT Education - 04/14/15 1116    Education provided Yes   Education Details educated on return to [employer], cardio  for endurance and ROM and NO heavy weights   Person(s) Educated Patient   Methods Explanation;Demonstration   Comprehension Verbalized understanding;Returned demonstration          PT Short Term Goals - 02/25/15 1152    PT SHORT TERM GOAL #1   Title independent with HEP   Time 4   Period Weeks   Status Achieved           PT Long Term Goals - 04/14/15 1118    PT LONG TERM GOAL #1   Title decrease pain 50%   Status Achieved   PT LONG TERM GOAL #2   Title increase ROM to 10-110 degrees flexion   Status Achieved   PT LONG TERM GOAL #3   Title independent with HEP   Status Achieved   PT LONG TERM GOAL #4   Title independent with RICE   Status Achieved               Plan - 04/14/15 1117    Clinical Impression Statement pt doing excellent,all goals met   PT Next Visit Plan Md note sent and rec. D/C next visit        Problem List Patient Active Problem List   Diagnosis Date Noted  . primary Osteoarthritis of left knee 01/23/2015  . Osteoarthritis of right knee 08/15/2014  .  Cancer of lower-outer quadrant of female breast 01/23/2012  . SNORING 05/03/2007  . HYPERGLYCEMIA 05/03/2007  . PHARYNGITIS, ACUTE 02/22/2007  . HYPERPLASIA, ENDOMETRIAL NOS 02/22/2007  . DYSMENORRHEA 10/18/2006  . DEGENERATIVE JOINT DISEASE 10/18/2006  . INSOMNIA 10/18/2006  . CHEST PAIN 10/18/2006  . OBESITY 10/05/2006  . ANXIETY 10/05/2006  . HYPERTENSION 10/05/2006  . GASTROENTERITIS 10/05/2006  . VAGINAL BLEEDING 10/05/2006  . MENORRHAGIA, PERIMENOPAUSAL 10/05/2006  . PATELLO-FEMORAL SYNDROME 10/05/2006    PAYSEUR,ANGIE PTA 04/14/2015, 11:43 AM  [address] [place], [place], [address] Phone: [phone]   Fax:  [phone]

## 2015-04-16 ENCOUNTER — Encounter: Payer: Medicare Other | Admitting: Physical Therapy

## 2015-04-17 ENCOUNTER — Encounter: Payer: Self-pay | Admitting: Physical Therapy

## 2015-04-17 ENCOUNTER — Ambulatory Visit: Payer: Medicare Other | Admitting: Physical Therapy

## 2015-04-17 DIAGNOSIS — Z4789 Encounter for other orthopedic aftercare: Secondary | ICD-10-CM | POA: Diagnosis not present

## 2015-04-17 DIAGNOSIS — M25662 Stiffness of left knee, not elsewhere classified: Secondary | ICD-10-CM

## 2015-04-17 NOTE — Therapy (Signed)
[address] [place], [place], [address] Phone: [phone]   Fax:  [phone]  Physical Therapy Treatment  Patient Details  Name: Morgan Taylor MRN: [id number] Date of Birth: [birth year] Referring Provider:  Earlie Server, MD  Encounter Date: 04/17/2015      PT End of Session - 04/17/15 1014    Visit Number 16   Authorization Type Medicare/medicaid   PT Start Time 1005   PT Stop Time 1040   PT Time Calculation (min) 35 min      Past Medical History  Diagnosis Date  . Diabetes mellitus   . Hypertension   . Arthritis   . Family history of anesthesia complication     Patients son has difficulty waking up after anesthesia  . Hypothyroidism   . Anxiety   . Depression   . Cancer 2009    lumpectomy  . Hyperlipemia   . GERD (gastroesophageal reflux disease)     Past Surgical History  Procedure Laterality Date  . Appendectomy    . Shoulder arthroscopy Left     cleaned out joint  . Cesarean section    . Dilation and curettage of uterus    . Breast lumpectomy Left   . Colonoscopy    . Total knee arthroplasty Right 08/15/2014    Procedure: RIGHT TOTAL KNEE ARTHROPLASTY;  Surgeon: Yvette Rack., MD;  Location: [facility];  Service: Orthopedics;  Laterality: Right;  . Total knee arthroplasty Left 01/23/2015    Procedure: TOTAL KNEE ARTHROPLASTY;  Surgeon: Yvette Rack., MD;  Location: [facility];  Service: Orthopedics;  Laterality: Left;    There were no vitals filed for this visit.  Visit Diagnosis:  Stiffness of knee joint, left      Subjective Assessment - 04/17/15 1013    Subjective MD very pleased,follow up in 3 monthes.Hurting all over today with rain-arthritis   Currently in Pain? Yes   Pain Score 4    Pain Location Knee   Pain Orientation Left   Pain Descriptors / Indicators Aching                         [facility] Adult PT Treatment/Exercise - 04/17/15 0001    Knee/Hip  Exercises: Aerobic   Stationary Bike 6 minutes   Elliptical Nustep 6 min  incline 10; resistance 5   Knee/Hip Exercises: Machines for Strengthening   Cybex Knee Extension 15# 2x15   Cybex Knee Flexion 35# 3 set 10   Cybex Leg Press 40# 3 sets 10  same with calf raises   Total Gym Leg Press no wt for ROM                PT Education - 04/17/15 1014    Education provided Yes   Education Details reviewed gym safety and progression   Person(s) Educated Patient   Methods Explanation   Comprehension Verbalized understanding;Returned demonstration          PT Short Term Goals - 02/25/15 1152    PT SHORT TERM GOAL #1   Title independent with HEP   Time 4   Period Weeks   Status Achieved           PT Long Term Goals - 04/14/15 1118    PT LONG TERM GOAL #1   Title decrease pain 50%   Status Achieved   PT LONG TERM GOAL #2   Title increase  ROM to 10-110 degrees flexion   Status Achieved   PT LONG TERM GOAL #3   Title independent with HEP   Status Achieved   PT LONG TERM GOAL #4   Title independent with RICE   Status Achieved               Plan - 04/17/15 1016    Clinical Impression Statement all goals met, MD pleased. Discharging today from PT and pt will return to independant exercise at [facility]        Problem List Patient Active Problem List   Diagnosis Date Noted  . primary Osteoarthritis of left knee 01/23/2015  . Osteoarthritis of right knee 08/15/2014  . Cancer of lower-outer quadrant of female breast 01/23/2012  . SNORING 05/03/2007  . HYPERGLYCEMIA 05/03/2007  . PHARYNGITIS, ACUTE 02/22/2007  . HYPERPLASIA, ENDOMETRIAL NOS 02/22/2007  . DYSMENORRHEA 10/18/2006  . DEGENERATIVE JOINT DISEASE 10/18/2006  . INSOMNIA 10/18/2006  . CHEST PAIN 10/18/2006  . OBESITY 10/05/2006  . ANXIETY 10/05/2006  . HYPERTENSION 10/05/2006  . GASTROENTERITIS 10/05/2006  . VAGINAL BLEEDING 10/05/2006  . MENORRHAGIA, PERIMENOPAUSAL 10/05/2006  .  PATELLO-FEMORAL SYNDROME 10/05/2006    PAYSEUR,ANGIE PTA 04/17/2015, 10:31 AM  [address] [place], [place], [address] Phone: [phone]   Fax:  [phone]

## 2015-04-21 ENCOUNTER — Telehealth: Payer: Self-pay | Admitting: *Deleted

## 2015-04-21 NOTE — Telephone Encounter (Signed)
Call received that she has "lumps to my head that are painful and bleed at times then scab over.  The lumps are getting worse and are now in five different places.  Stared two weeks ago.  I'm uncomfortable.  It feels like something is sitting on my head.  Afraid I'm not covered from cancer because I've been off arimidex since last year.  I did dye my hair before this started."   Denies any new hair products.  Reports no new medications just dose changes.  Admits to scratching her scalp so it does itch.  Unable to describe how lumps look just that they are lumps.    Will notify provider.  Seen by Charlestine Massed with next F/U on 10-15-2015 with newly assigned oncologist Dr. Lindi Adie.  Guy Franco to contact PCP, go to an Urgent Care or contact a dermatologist.

## 2015-04-21 NOTE — Telephone Encounter (Signed)
Completed.  Advised to see PCP with initial call.

## 2015-04-21 NOTE — Telephone Encounter (Signed)
Please have her see her primary care physician

## 2015-07-23 ENCOUNTER — Ambulatory Visit: Payer: Medicare Other | Attending: Orthopedic Surgery | Admitting: Physical Therapy

## 2015-07-23 DIAGNOSIS — M25511 Pain in right shoulder: Secondary | ICD-10-CM

## 2015-07-23 DIAGNOSIS — M6281 Muscle weakness (generalized): Secondary | ICD-10-CM | POA: Diagnosis present

## 2015-07-23 DIAGNOSIS — M25611 Stiffness of right shoulder, not elsewhere classified: Secondary | ICD-10-CM | POA: Insufficient documentation

## 2015-07-23 NOTE — Patient Instructions (Signed)
SHOULDER: External Rotation - Supine (Cane)   Hold cane with both hands. Rotate arm away from body. Keep elbow on floor and next to body. 10___ reps per set, _2-3__ sets per day, ___ days per week Add towel to keep elbow at side.  Wall Slide With Cervical Neutral / Upward Scapular Rotation: Standing   Elbows bent, little fingers on wall, thumbs out. Slide up, shrugging shoulders. Keep head and pelvis aligned. Slide only as far as possible without pain. Do _10__ times, __2_ times per day. In pain free range. http://ss.exer.us/225   Copyright  VHI. All rights reserved.    Scapular Retraction (Standing)   With arms at sides, pinch shoulder blades together. Repeat 10____ times per set. Do ____ sets per session. Do _2___ sessions per day.  http://orth.exer.us/944    Madelyn Flavors, PT 07/23/2015 4:15 PM [facility] [address], [place], [place] [address] Phone # [phone] Fax [phone]

## 2015-07-23 NOTE — Therapy (Signed)
[facility] [address] [place], [place], [address] Phone: [phone]   Fax:  [phone]  Physical Therapy Evaluation  Patient Details  Name: Morgan Taylor MRN: [id number] Date of Birth: Mar 08, 1961 Referring Provider:  Earlie Server, MD  Encounter Date: 07/23/2015      PT End of Session - 07/23/15 1540    Visit Number 1   Date for PT Re-Evaluation 09/17/15   Authorization Type Medicare/medicaid   PT Start Time 1541   PT Stop Time 1616   PT Time Calculation (min) 35 min   Activity Tolerance Patient tolerated treatment well   Behavior During Therapy [facility] for tasks assessed/performed      Past Medical History  Diagnosis Date  . Diabetes mellitus   . Hypertension   . Arthritis   . Family history of anesthesia complication     Patients son has difficulty waking up after anesthesia  . Hypothyroidism   . Anxiety   . Depression   . Cancer 2009    lumpectomy  . Hyperlipemia   . GERD (gastroesophageal reflux disease)     Past Surgical History  Procedure Laterality Date  . Appendectomy    . Shoulder arthroscopy Left     cleaned out joint  . Cesarean section    . Dilation and curettage of uterus    . Breast lumpectomy Left   . Colonoscopy    . Total knee arthroplasty Right 08/15/2014    Procedure: RIGHT TOTAL KNEE ARTHROPLASTY;  Surgeon: Yvette Rack., MD;  Location: [facility];  Service: Orthopedics;  Laterality: Right;  . Total knee arthroplasty Left 01/23/2015    Procedure: TOTAL KNEE ARTHROPLASTY;  Surgeon: Yvette Rack., MD;  Location: [facility];  Service: Orthopedics;  Laterality: Left;    There were no vitals filed for this visit.  Visit Diagnosis:  Right shoulder pain - Plan: PT plan of care cert/re-cert  Stiffness of right shoulder joint - Plan: PT plan of care cert/re-cert  Generalized muscle weakness - Plan: PT plan of care cert/re-cert      Subjective Assessment - 07/23/15 1545    Subjective Patient was having a lot of pain in the right shoulder due to chipped bone spurs. She had surgery 07/15/15. She has a partial thickness tear of the superior surface of the cuff per MD report. This was not repaired at this time.   Pertinent History bil TKR, arthritis, scope Left shoulder   Patient Stated Goals decrease pain in right shoulder   Currently in Pain? Yes   Pain Score 8    Pain Location Shoulder   Pain Orientation Right   Pain Descriptors / Indicators Aching   Pain Type Surgical pain   Pain Onset 1 to 4 weeks ago   Pain Frequency Constant   Aggravating Factors  carrying things (not lifting yet with it); reaching behind  her   Pain Relieving Factors meds, propping it up   Effect of Pain on Daily Activities limiting all ADLS since right hand dominant.            [facility] PT Assessment - 07/23/15 0001    Assessment   Medical Diagnosis s/p Rt shoulder debridement, SAD, acromioplasty   Onset Date/Surgical Date 07/15/15   Hand Dominance Right   Next MD Visit none scheduled   Precautions   Precaution Comments no lifting    Required Braces or Orthoses Sling  weaning out of sling this week per MD.   Balance  Screen   Has the patient fallen in the past 6 months No   Has the patient had a decrease in activity level because of a fear of falling?  No   Is the patient reluctant to leave their home because of a fear of falling?  No   Home Environment   Living Arrangements --  Lives with daughter, but moving soon to new home   Prior Function   Level of Independence Independent with basic ADLs   ROM / Strength   AROM / PROM / Strength AROM;Strength   AROM   AROM Assessment Site Shoulder   Right/Left Shoulder Right   Right Shoulder Flexion 146 Degrees  164 passive   Right Shoulder ABduction 157 Degrees  166 passive   Right Shoulder Internal Rotation 64 Degrees  thumb can reach bra strap   Right Shoulder External Rotation 40 Degrees  42 passive   Strength   Overall  Strength Comments Rt shoulder not tested but at least 3/5   Palpation   Palpation comment tender of ant/post cuff and in right axilla                           PT Education - 2015-08-03 1726    Education provided Yes   Education Details HEP: supine cane IR/ER; wall slides flexion; scapular retraction   Person(s) Educated Patient   Methods Explanation;Demonstration;Handout   Comprehension Verbalized understanding;Returned demonstration          PT Short Term Goals - 2015-08-03 1735    PT SHORT TERM GOAL #1   Title independent with HEP   Time 2   Period Weeks   Status New           PT Long Term Goals - 03-Aug-2015 1735    PT LONG TERM GOAL #1   Title decrease pain 50% or more with ADLs   Time 6   Period Weeks   Status New   PT LONG TERM GOAL #2   Title increase ROM to [facility] to perform normal ADLS   Time 6   Period Weeks   Status New   PT LONG TERM GOAL #3   Title independent with HEP   Time 6   Period Weeks   Status New   PT LONG TERM GOAL #4   Title demo 4+/5 right shoulder strength   Time 6   Period Weeks   Status New               Plan - August 03, 2015 1727    Clinical Impression Statement Patient presents s/p Right shoulder SAD, debridement and acromioplasty on 07/15/15. She also has a partial thickness tear of superior surface of cuff per MD report. She demos decreased ROM and strength affecting ADLS as she is right hand dominant. She also has constant pain. Patient will benefit from PT to address these deficits and return function.   Pt will benefit from skilled therapeutic intervention in order to improve on the following deficits Decreased range of motion;Impaired UE functional use;Pain;Decreased strength   Rehab Potential Excellent   PT Frequency 2x / week   PT Duration 6 weeks   PT Treatment/Interventions Electrical Stimulation;Cryotherapy;Ultrasound;Patient/family education;Therapeutic exercise;Manual techniques;Passive range of  motion;Vasopneumatic Device   PT Next Visit Plan ROM, pain control, scapular stab   Consulted and Agree with Plan of Care Patient          G-Codes - Aug 03, 2015 1738    Functional Assessment Tool  Used clinical judgement   Functional Limitation Carrying, moving and handling objects   Carrying, Moving and Handling Objects Current Status [phone]) At least 60 percent but less than 80 percent impaired, limited or restricted   Carrying, Moving and Handling Objects Goal Status ([id number]) At least 20 percent but less than 40 percent impaired, limited or restricted       Problem List Patient Active Problem List   Diagnosis Date Noted  . primary Osteoarthritis of left knee 01/23/2015  . Osteoarthritis of right knee 08/15/2014  . Cancer of lower-outer quadrant of female breast 01/23/2012  . SNORING 05/03/2007  . HYPERGLYCEMIA 05/03/2007  . PHARYNGITIS, ACUTE 02/22/2007  . HYPERPLASIA, ENDOMETRIAL NOS 02/22/2007  . DYSMENORRHEA 10/18/2006  . DEGENERATIVE JOINT DISEASE 10/18/2006  . INSOMNIA 10/18/2006  . CHEST PAIN 10/18/2006  . OBESITY 10/05/2006  . ANXIETY 10/05/2006  . HYPERTENSION 10/05/2006  . GASTROENTERITIS 10/05/2006  . VAGINAL BLEEDING 10/05/2006  . MENORRHAGIA, PERIMENOPAUSAL 10/05/2006  . PATELLO-FEMORAL SYNDROME 10/05/2006    Madelyn Flavors PT  07/23/2015, 5:42 PM  [facility] [address], [place], [place], [address] Phone: [phone]   Fax:  [phone]

## 2015-07-28 ENCOUNTER — Ambulatory Visit: Payer: Medicare Other | Attending: Orthopedic Surgery | Admitting: Physical Therapy

## 2015-07-28 DIAGNOSIS — M25511 Pain in right shoulder: Secondary | ICD-10-CM | POA: Insufficient documentation

## 2015-07-28 DIAGNOSIS — M25611 Stiffness of right shoulder, not elsewhere classified: Secondary | ICD-10-CM | POA: Insufficient documentation

## 2015-07-28 DIAGNOSIS — M6281 Muscle weakness (generalized): Secondary | ICD-10-CM | POA: Insufficient documentation

## 2015-08-07 ENCOUNTER — Ambulatory Visit: Payer: Medicare Other

## 2015-08-07 DIAGNOSIS — M25511 Pain in right shoulder: Secondary | ICD-10-CM | POA: Diagnosis not present

## 2015-08-07 DIAGNOSIS — M6281 Muscle weakness (generalized): Secondary | ICD-10-CM | POA: Diagnosis present

## 2015-08-07 DIAGNOSIS — M25611 Stiffness of right shoulder, not elsewhere classified: Secondary | ICD-10-CM

## 2015-08-07 NOTE — Therapy (Signed)
[place], [place], [address] Phone: [phone]   Fax:  [phone]  Physical Therapy Treatment  Patient Details  Name: Morgan [place] MRN: [id number] Date of Birth: March 29, 1961 Referring Provider:  Bufford Buttner, MD  Encounter Date: 08/07/2015      PT End of Session - 08/07/15 0743    Visit Number 2   Number of Visits 16   Date for PT Re-Evaluation 09/17/15   PT Start Time 0700   PT Stop Time 0800   PT Time Calculation (min) 60 min   Activity Tolerance Patient tolerated treatment well   Behavior During Therapy [facility] for tasks assessed/performed      Past Medical History  Diagnosis Date  . Diabetes mellitus   . Hypertension   . Arthritis   . Family history of anesthesia complication     Patients son has difficulty waking up after anesthesia  . Hypothyroidism   . Anxiety   . Depression   . Cancer 2009    lumpectomy  . Hyperlipemia   . GERD (gastroesophageal reflux disease)     Past Surgical History  Procedure Laterality Date  . Appendectomy    . Shoulder arthroscopy Left     cleaned out joint  . Cesarean section    . Dilation and curettage of uterus    . Breast lumpectomy Left   . Colonoscopy    . Total knee arthroplasty Right 08/15/2014    Procedure: RIGHT TOTAL KNEE ARTHROPLASTY;  Surgeon: Yvette Rack., MD;  Location: [place];  Service: Orthopedics;  Laterality: Right;  . Total knee arthroplasty Left 01/23/2015    Procedure: TOTAL KNEE ARTHROPLASTY;  Surgeon: Yvette Rack., MD;  Location: [place];  Service: Orthopedics;  Laterality: Left;    There were no vitals filed for this visit.  Visit Diagnosis:  Right shoulder pain  Stiffness of right shoulder joint  Generalized muscle weakness      Subjective Assessment - 08/07/15 0702    Subjective She reports nothing different with anterior RT shoulder pain with stretching quickm gets sharp pain. Kind of OK. Using arm due to moving,  rain may not have helped. MD said cont Pt .    Currently in Pain? Yes   Pain Score 6    Pain Location Shoulder   Pain Orientation Right;Anterior   Pain Descriptors / Indicators Aching   Pain Type Surgical pain   Pain Onset 1 to 4 weeks ago   Pain Frequency Constant   Aggravating Factors  using arm, quickj movements   Pain Relieving Factors meds, heat   Multiple Pain Sites No            [facility] PT Assessment - 08/07/15 0705    AROM   Right Shoulder Flexion 150 Degrees   Right Shoulder ABduction 158 Degrees   Right Shoulder Internal Rotation 42 Degrees   Right Shoulder External Rotation 70 Degrees                     [facility] Adult PT Treatment/Exercise - 08/07/15 0709    Exercises   Exercises Shoulder   Neck Exercises: Machines for Strengthening   UBE (Upper Arm Bike) 120 RPM 4 min pt own pace   Modalities   Modalities Moist Heat   Moist Heat Therapy   Number Minutes Moist Heat 15 Minutes   Moist Heat Location Shoulder  RT   Manual Therapy   Manual Therapy Soft tissue mobilization  Soft tissue mobilization With use of rock blade silver I worked anterior RT shoulder all they way to wrist in fscial plane with varoious angles and depth  with oscillation nad slow stretching       All HEP reviewed and she was able to do these an reports doing 4-5x/day but 2-3 reps each           PT Short Term Goals - 08/07/15 0745    PT SHORT TERM GOAL #1   Title independent with HEP   Status Achieved           PT Long Term Goals - 07/23/15 1735    PT LONG TERM GOAL #1   Title decrease pain 50% or more with ADLs   Time 6   Period Weeks   Status New   PT LONG TERM GOAL #2   Title increase ROM to [facility] to perform normal ADLS   Time 6   Period Weeks   Status New   PT LONG TERM GOAL #3   Title independent with HEP   Time 6   Period Weeks   Status New   PT LONG TERM GOAL #4   Title demo 4+/5 right shoulder strength   Time 6   Period Weeks   Status New                Plan - 08/07/15 0743    Clinical Impression Statement Range slightly improved, pain still limiting activity but report of lifting items and moving may not allow the pain to ease. Will continue manual and wirk band exercise if tolerated next visit   PT Next Visit Plan [place] treatment , heat or cold, band exercises   Consulted and Agree with Plan of Care Patient        Problem List Patient Active Problem List   Diagnosis Date Noted  . primary Osteoarthritis of left knee 01/23/2015  . Osteoarthritis of right knee 08/15/2014  . Cancer of lower-outer quadrant of female breast 01/23/2012  . SNORING 05/03/2007  . HYPERGLYCEMIA 05/03/2007  . PHARYNGITIS, ACUTE 02/22/2007  . HYPERPLASIA, ENDOMETRIAL NOS 02/22/2007  . DYSMENORRHEA 10/18/2006  . DEGENERATIVE JOINT DISEASE 10/18/2006  . INSOMNIA 10/18/2006  . CHEST PAIN 10/18/2006  . OBESITY 10/05/2006  . ANXIETY 10/05/2006  . HYPERTENSION 10/05/2006  . GASTROENTERITIS 10/05/2006  . VAGINAL BLEEDING 10/05/2006  . MENORRHAGIA, PERIMENOPAUSAL 10/05/2006  . PATELLO-FEMORAL SYNDROME 10/05/2006    Darrel Hoover PT 08/07/2015, 7:46 AM  [facility] [address] [place], [place], [address] Phone: [phone]   Fax:  [phone]

## 2015-08-10 ENCOUNTER — Ambulatory Visit: Payer: Medicare Other

## 2015-08-10 DIAGNOSIS — M25511 Pain in right shoulder: Secondary | ICD-10-CM | POA: Diagnosis not present

## 2015-08-10 DIAGNOSIS — M25611 Stiffness of right shoulder, not elsewhere classified: Secondary | ICD-10-CM

## 2015-08-10 DIAGNOSIS — M6281 Muscle weakness (generalized): Secondary | ICD-10-CM

## 2015-08-10 NOTE — Therapy (Signed)
[place], [place], [address] Phone: [phone]   Fax:  [phone]  Physical Therapy Treatment  Patient Details  Name: Morgan [place] MRN: [id number] Date of Birth: 21-Nov-1961 Referring Provider:  Bufford Buttner, MD  Encounter Date: 08/10/2015      PT End of Session - 08/10/15 1501    Visit Number 3   Number of Visits 16   Date for PT Re-Evaluation 09/17/15   PT Start Time 0215   PT Stop Time 0320   PT Time Calculation (min) 65 min   Activity Tolerance Patient tolerated treatment well   Behavior During Therapy [facility] for tasks assessed/performed      Past Medical History  Diagnosis Date  . Diabetes mellitus   . Hypertension   . Arthritis   . Family history of anesthesia complication     Patients son has difficulty waking up after anesthesia  . Hypothyroidism   . Anxiety   . Depression   . Cancer 2009    lumpectomy  . Hyperlipemia   . GERD (gastroesophageal reflux disease)     Past Surgical History  Procedure Laterality Date  . Appendectomy    . Shoulder arthroscopy Left     cleaned out joint  . Cesarean section    . Dilation and curettage of uterus    . Breast lumpectomy Left   . Colonoscopy    . Total knee arthroplasty Right 08/15/2014    Procedure: RIGHT TOTAL KNEE ARTHROPLASTY;  Surgeon: Yvette Rack., MD;  Location: [place];  Service: Orthopedics;  Laterality: Right;  . Total knee arthroplasty Left 01/23/2015    Procedure: TOTAL KNEE ARTHROPLASTY;  Surgeon: Yvette Rack., MD;  Location: [place];  Service: Orthopedics;  Laterality: Left;    There were no vitals filed for this visit.  Visit Diagnosis:  Right shoulder pain  Stiffness of right shoulder joint  Generalized muscle weakness      Subjective Assessment - 08/10/15 1425    Subjective Severe pain last 3 days. Not sure why   Currently in Pain? Yes   Pain Score 8    Pain Location Shoulder   Pain Orientation  Right;Anterior   Pain Descriptors / Indicators Aching   Pain Type Surgical pain   Pain Onset 1 to 4 weeks ago   Pain Frequency Constant   Aggravating Factors  Nothing. It just hurts   Pain Relieving Factors mes rest   Multiple Pain Sites No                         [facility] Adult PT Treatment/Exercise - 08/10/15 1426    Neck Exercises: Machines for Strengthening   UBE (Upper Arm Bike) 120 RPM 5 min pt own pace   Moist Heat Therapy   Number Minutes Moist Heat 15 Minutes   Moist Heat Location Shoulder   Electrical Stimulation   Electrical Stimulation Location RT shoulder with heat   Electrical Stimulation Parameters IFC  L10   Electrical Stimulation Goals Pain   Manual Therapy   Manual Therapy Taping   Soft tissue mobilization With use of rock blade silver and hands I worked anterior RT shoulder all they way to wrist in fscial plane with varoious angles and depth  with oscillation nad slow stretching. Also manual distractionsn pulls with inf glides.    Kinesiotex --  pain releif with Y on dletoid I over AC and upper trap  Gentle activew range all planes              PT Short Term Goals - 08/07/15 0745    PT SHORT TERM GOAL #1   Title independent with HEP   Status Achieved           PT Long Term Goals - 07/23/15 1735    PT LONG TERM GOAL #1   Title decrease pain 50% or more with ADLs   Time 6   Period Weeks   Status New   PT LONG TERM GOAL #2   Title increase ROM to [facility] to perform normal ADLS   Time 6   Period Weeks   Status New   PT LONG TERM GOAL #3   Title independent with HEP   Time 6   Period Weeks   Status New   PT LONG TERM GOAL #4   Title demo 4+/5 right shoulder strength   Time 6   Period Weeks   Status New               Plan - 08/10/15 1501    Clinical Impression Statement Still tneder anterior shoulder so geared treatment for decreased pain . She was some better after treatment.  Asked her to rest arm more   PT  Next Visit Plan [place] treatment , heat or cold, band exercises if pain down  gentle mobs and tape if helpful   Consulted and Agree with Plan of Care Patient        Problem List Patient Active Problem List   Diagnosis Date Noted  . primary Osteoarthritis of left knee 01/23/2015  . Osteoarthritis of right knee 08/15/2014  . Cancer of lower-outer quadrant of female breast 01/23/2012  . SNORING 05/03/2007  . HYPERGLYCEMIA 05/03/2007  . PHARYNGITIS, ACUTE 02/22/2007  . HYPERPLASIA, ENDOMETRIAL NOS 02/22/2007  . DYSMENORRHEA 10/18/2006  . DEGENERATIVE JOINT DISEASE 10/18/2006  . INSOMNIA 10/18/2006  . CHEST PAIN 10/18/2006  . OBESITY 10/05/2006  . ANXIETY 10/05/2006  . HYPERTENSION 10/05/2006  . GASTROENTERITIS 10/05/2006  . VAGINAL BLEEDING 10/05/2006  . MENORRHAGIA, PERIMENOPAUSAL 10/05/2006  . PATELLO-FEMORAL SYNDROME 10/05/2006    Darrel Hoover PT 08/10/2015, 3:03 PM  [facility] [address] [place], [place], [address] Phone: [phone]   Fax:  [phone]

## 2015-08-12 ENCOUNTER — Ambulatory Visit: Payer: Medicare Other

## 2015-08-17 ENCOUNTER — Ambulatory Visit: Payer: Medicare Other

## 2015-08-19 ENCOUNTER — Ambulatory Visit: Payer: Medicare Other

## 2015-08-19 DIAGNOSIS — M6281 Muscle weakness (generalized): Secondary | ICD-10-CM

## 2015-08-19 DIAGNOSIS — M25511 Pain in right shoulder: Secondary | ICD-10-CM | POA: Diagnosis not present

## 2015-08-19 DIAGNOSIS — M25611 Stiffness of right shoulder, not elsewhere classified: Secondary | ICD-10-CM

## 2015-08-19 NOTE — Therapy (Signed)
[place], [place], [address] Phone: [phone]   Fax:  [phone]  Physical Therapy Treatment  Patient Details  Name: Morgan [place] MRN: [id number] Date of Birth: [birth year] Referring Provider:  Bufford Buttner, MD  Encounter Date: 08/19/2015      PT End of Session - 08/19/15 0929    Visit Number 4   Number of Visits 16   Date for PT Re-Evaluation 09/17/15   PT Start Time 0847   PT Stop Time 0930   PT Time Calculation (min) 43 min   Activity Tolerance Patient tolerated treatment well   Behavior During Therapy [facility] for tasks assessed/performed      Past Medical History  Diagnosis Date  . Diabetes mellitus   . Hypertension   . Arthritis   . Family history of anesthesia complication     Patients son has difficulty waking up after anesthesia  . Hypothyroidism   . Anxiety   . Depression   . Cancer 2009    lumpectomy  . Hyperlipemia   . GERD (gastroesophageal reflux disease)     Past Surgical History  Procedure Laterality Date  . Appendectomy    . Shoulder arthroscopy Left     cleaned out joint  . Cesarean section    . Dilation and curettage of uterus    . Breast lumpectomy Left   . Colonoscopy    . Total knee arthroplasty Right 08/15/2014    Procedure: RIGHT TOTAL KNEE ARTHROPLASTY;  Surgeon: Yvette Rack., MD;  Location: [place];  Service: Orthopedics;  Laterality: Right;  . Total knee arthroplasty Left 01/23/2015    Procedure: TOTAL KNEE ARTHROPLASTY;  Surgeon: Yvette Rack., MD;  Location: [place];  Service: Orthopedics;  Laterality: Left;    There were no vitals filed for this visit.  Visit Diagnosis:  Right shoulder pain  Stiffness of right shoulder joint  Generalized muscle weakness      Subjective Assessment - 08/19/15 0853    Subjective Had an illness so did feel well in past week. Question if ate something bad.  Shoulder    Currently in Pain? Yes   Pain Score 5    Pain Location Shoulder   Pain Orientation Right;Anterior   Pain Descriptors / Indicators Aching   Pain Type Surgical pain   Pain Onset More than a month ago   Pain Frequency Constant   Aggravating Factors  Nothing   Pain Relieving Factors Meds , rest   Multiple Pain Sites No            [facility] PT Assessment - 08/19/15 0859    AROM   Right Shoulder Flexion 152 Degrees   Right Shoulder ABduction 162 Degrees   Right Shoulder External Rotation 69 Degrees   Strength   Overall Strength Comments RT shoulder flexion 3+/5 with pain, abduction 4/5 with pain, ER 4/5 with min pain. IR /extenison 4+/5                     [facility] Adult PT Treatment/Exercise - 08/19/15 0855    Neck Exercises: Machines for Strengthening   UBE (Upper Arm Bike) L1 scifit 5 min   Shoulder Exercises: Standing   External Rotation Strengthening;Right;12 reps   Theraband Level (Shoulder External Rotation) Level 2 (Red)   Internal Rotation Strengthening;Right;12 reps   Theraband Level (Shoulder Internal Rotation) Level 2 (Red)   Row Strengthening;Right;12 reps   Theraband Level (Shoulder Row) Level 2 (Red)  Other Standing Exercises punches red band x12   Manual Therapy   Soft tissue mobilization With use of rock blade black and hands I worked anterior RT shoulder all they way to wrist in fscial plane with varoious angles and depth  with oscillation nad slow stretching. Also manual distractionsn pulls with inf glides.    Kinesiotex --  AS per 8/15 as she felt this helped last time                  PT Short Term Goals - 08/19/15 0931    PT SHORT TERM GOAL #1   Title independent with HEP   Status Achieved           PT Long Term Goals - 08/19/15 0932    PT LONG TERM GOAL #1   Title decrease pain 50% or more with ADLs   Status On-going   PT LONG TERM GOAL #2   Title increase ROM to [facility] to perform normal ADLS   Status Achieved   PT LONG TERM GOAL #3   Title independent with HEP    Status On-going   PT LONG TERM GOAL #4   Title demo 4+/5 right shoulder strength   Status On-going               Plan - 08/19/15 0929    Clinical Impression Statement She continues to be tender anterior RT shoulder but less today and most tender  pec minor . She declined heat as she was doing well and would use pad at home if needed. Range continue to be good and it is not likely this will improve more as LT shoulder range decrreased due to OA   PT Next Visit Plan Continue strength, manual gentle range and modalities for pain   PT Home Exercise Plan Add [place] to home   Consulted and Agree with Plan of Care Patient        Problem List Patient Active Problem List   Diagnosis Date Noted  . primary Osteoarthritis of left knee 01/23/2015  . Osteoarthritis of right knee 08/15/2014  . Cancer of lower-outer quadrant of female breast 01/23/2012  . SNORING 05/03/2007  . HYPERGLYCEMIA 05/03/2007  . PHARYNGITIS, ACUTE 02/22/2007  . HYPERPLASIA, ENDOMETRIAL NOS 02/22/2007  . DYSMENORRHEA 10/18/2006  . DEGENERATIVE JOINT DISEASE 10/18/2006  . INSOMNIA 10/18/2006  . CHEST PAIN 10/18/2006  . OBESITY 10/05/2006  . ANXIETY 10/05/2006  . HYPERTENSION 10/05/2006  . GASTROENTERITIS 10/05/2006  . VAGINAL BLEEDING 10/05/2006  . MENORRHAGIA, PERIMENOPAUSAL 10/05/2006  . PATELLO-FEMORAL SYNDROME 10/05/2006    Darrel Hoover PT 08/19/2015, 9:34 AM  [facility] [address] [place], [place], [address] Phone: [phone]   Fax:  [phone]

## 2015-08-25 ENCOUNTER — Ambulatory Visit: Payer: Medicare Other | Admitting: Physical Therapy

## 2015-08-25 DIAGNOSIS — M25511 Pain in right shoulder: Secondary | ICD-10-CM | POA: Diagnosis not present

## 2015-08-25 NOTE — Patient Instructions (Addendum)
Call MD about the fall. Use ice at home to help pain.

## 2015-08-25 NOTE — Therapy (Signed)
[place], [place], [address] Phone: [phone]   Fax:  [phone]  Physical Therapy Treatment  Patient Details  Name: Morgan Taylor MRN: [id number] Date of Birth: 1961/06/27 Referring Provider:  Bufford Buttner, MD  Encounter Date: 08/25/2015      PT End of Session - 08/25/15 1413    Visit Number 5   Number of Visits 16   Date for PT Re-Evaluation 09/17/15   PT Start Time 1330   PT Stop Time 1400   PT Time Calculation (min) 30 min   Activity Tolerance Patient tolerated treatment well;No increased pain   Behavior During Therapy [facility] for tasks assessed/performed      Past Medical History  Diagnosis Date  . Diabetes mellitus   . Hypertension   . Arthritis   . Family history of anesthesia complication     Patients son has difficulty waking up after anesthesia  . Hypothyroidism   . Anxiety   . Depression   . Cancer 2009    lumpectomy  . Hyperlipemia   . GERD (gastroesophageal reflux disease)     Past Surgical History  Procedure Laterality Date  . Appendectomy    . Shoulder arthroscopy Left     cleaned out joint  . Cesarean section    . Dilation and curettage of uterus    . Breast lumpectomy Left   . Colonoscopy    . Total knee arthroplasty Right 08/15/2014    Procedure: RIGHT TOTAL KNEE ARTHROPLASTY;  Surgeon: Morgan Taylor., MD;  Location: [place];  Service: Orthopedics;  Laterality: Right;  . Total knee arthroplasty Left 01/23/2015    Procedure: TOTAL KNEE ARTHROPLASTY;  Surgeon: Morgan Taylor., MD;  Location: [place];  Service: Orthopedics;  Laterality: Left;    There were no vitals filed for this visit.  Visit Diagnosis:  Right shoulder pain      Subjective Assessment - 08/25/15 1335    Subjective     Currently in Pain? Yes   Pain Score 5    Pain Location Shoulder   Pain Descriptors / Indicators Dull;Aching   Pain Radiating Towards miid arm  started sunday.   Aggravating  Factors  carry heavy itemds to church    Pain Relieving Factors tape,                         [facility] Adult PT Treatment/Exercise - 08/25/15 0001    Electrical Stimulation   Electrical Stimulation Location RT shoulder   Electrical Stimulation Parameters 125 volts, High volt   Electrical Stimulation Goals Pain   Manual Therapy   Manual therapy comments 3 Y's for posture correction, deltiod and supraspinatus inhibition.  Pain improved with tape.                  PT Short Term Goals - 08/19/15 0931    PT SHORT TERM GOAL #1   Title independent with HEP   Status Achieved           PT Long Term Goals - 08/25/15 1447    PT LONG TERM GOAL #1   Title decrease pain 50% or more with ADLs   Baseline pain flare post fall 6/10   Time 6   Period Weeks   Status On-going   PT LONG TERM GOAL #2   Title increase ROM to [facility] to perform normal ADLS   Time 6   Period Weeks  Status On-going   PT LONG TERM GOAL #3   Title independent with HEP   Time 6   Period Weeks   Status On-going   PT LONG TERM GOAL #4   Title demo 4+/5 right shoulder strength   Time 6   Period Weeks   Status Unable to assess               Plan - 08/25/15 1352    Clinical Impression Statement Morgan Taylor off her inflatable bed and landed on the floor on her elbow and right knee.  E-Stim helpful.  Tape helpful.  PT Morgan Taylor said to treat for pain only and have patient call her MD.     PT Next Visit Plan See what MD sais after fall today at home.   Consulted and Agree with Plan of Care Patient        Problem List Patient Active Problem List   Diagnosis Date Noted  . primary Osteoarthritis of left knee 01/23/2015  . Osteoarthritis of right knee 08/15/2014  . Cancer of lower-outer quadrant of female breast 01/23/2012  . SNORING 05/03/2007  . HYPERGLYCEMIA 05/03/2007  . PHARYNGITIS, ACUTE 02/22/2007  . HYPERPLASIA, ENDOMETRIAL NOS 02/22/2007  . DYSMENORRHEA 10/18/2006  .  DEGENERATIVE JOINT DISEASE 10/18/2006  . INSOMNIA 10/18/2006  . CHEST PAIN 10/18/2006  . OBESITY 10/05/2006  . ANXIETY 10/05/2006  . HYPERTENSION 10/05/2006  . GASTROENTERITIS 10/05/2006  . VAGINAL BLEEDING 10/05/2006  . MENORRHAGIA, PERIMENOPAUSAL 10/05/2006  . PATELLO-FEMORAL SYNDROME 10/05/2006    Morgan Taylor 08/25/2015, 2:49 PM  [facility] [address] [place], [place], [address] Phone: [phone]   Fax:  [phone]     Melvenia Needles, PTA 08/25/2015 2:49 PM Phone: [phone] Fax: [phone]

## 2015-08-27 ENCOUNTER — Ambulatory Visit: Payer: Medicare Other | Attending: Orthopedic Surgery

## 2015-08-27 DIAGNOSIS — M25511 Pain in right shoulder: Secondary | ICD-10-CM | POA: Diagnosis present

## 2015-08-27 DIAGNOSIS — M6281 Muscle weakness (generalized): Secondary | ICD-10-CM | POA: Diagnosis present

## 2015-08-27 DIAGNOSIS — M25662 Stiffness of left knee, not elsewhere classified: Secondary | ICD-10-CM | POA: Insufficient documentation

## 2015-08-27 DIAGNOSIS — M25611 Stiffness of right shoulder, not elsewhere classified: Secondary | ICD-10-CM | POA: Diagnosis present

## 2015-08-27 DIAGNOSIS — M25562 Pain in left knee: Secondary | ICD-10-CM | POA: Insufficient documentation

## 2015-08-27 NOTE — Therapy (Signed)
[place], [place], [address] Phone: [phone]   Fax:  [phone]  Physical Therapy Treatment  Patient Details  Name: Morgan Taylor MRN: [id number] Date of Birth: 1961-09-04 Referring Provider:  Bufford Buttner, MD  Encounter Date: 08/27/2015      PT End of Session - 08/27/15 1229    Visit Number 6   Number of Visits 16   Date for PT Re-Evaluation 09/17/15   PT Start Time 1151   PT Stop Time 1247   PT Time Calculation (min) 56 min   Activity Tolerance Patient tolerated treatment well;Patient limited by pain   Behavior During Therapy [facility] for tasks assessed/performed      Past Medical History  Diagnosis Date  . Diabetes mellitus   . Hypertension   . Arthritis   . Family history of anesthesia complication     Patients son has difficulty waking up after anesthesia  . Hypothyroidism   . Anxiety   . Depression   . Cancer 2009    lumpectomy  . Hyperlipemia   . GERD (gastroesophageal reflux disease)     Past Surgical History  Procedure Laterality Date  . Appendectomy    . Shoulder arthroscopy Left     cleaned out joint  . Cesarean section    . Dilation and curettage of uterus    . Breast lumpectomy Left   . Colonoscopy    . Total knee arthroplasty Right 08/15/2014    Procedure: RIGHT TOTAL KNEE ARTHROPLASTY;  Surgeon: Yvette Rack., MD;  Location: [place];  Service: Orthopedics;  Laterality: Right;  . Total knee arthroplasty Left 01/23/2015    Procedure: TOTAL KNEE ARTHROPLASTY;  Surgeon: Yvette Rack., MD;  Location: [place];  Service: Orthopedics;  Laterality: Left;    There were no vitals filed for this visit.  Visit Diagnosis:  Right shoulder pain  Stiffness of right shoulder joint  Generalized muscle weakness      Subjective Assessment - 08/27/15 1155    Subjective MD next week, Shoulder better with tape and sleeping on regular mattress past few days.    Currently in Pain? Yes    Pain Score 3    Pain Location Shoulder   Pain Orientation Right;Anterior   Pain Descriptors / Indicators Aching   Pain Type Surgical pain   Pain Onset --  acute with fall   Pain Frequency Constant   Aggravating Factors  using arm   Pain Relieving Factors tape   Multiple Pain Sites No            [facility] PT Assessment - 08/27/15 0001    AROM   Right Shoulder Flexion 154 Degrees   Right Shoulder ABduction 160 Degrees   Right Shoulder External Rotation 70 Degrees                     [facility] Adult PT Treatment/Exercise - 08/27/15 1157    Neck Exercises: Machines for Strengthening   UBE (Upper Arm Bike) 120 rpm  5 min forward.    Shoulder Exercises: Seated   Other Seated Exercises Reaching over head 2 pounds x 10 with cues for max range the LT sidelye ER 2 pounds x 12 ,3 pounds x 12, Then abdcution 3 pounds to 90 degrees x15, Then LT sidlye arm openings 2x8 reps. Then supine protraction 3 pounds x 15   Moist Heat Therapy   Number Minutes Moist Heat 15 Minutes   Moist Heat  Location Shoulder   Electrical Stimulation   Electrical Stimulation Location RT shoulder   Electrical Stimulation Parameters IFC L12   Electrical Stimulation Goals Pain   Manual Therapy   Manual Therapy Passive ROM   Passive ROM gentle PROM to tolerance. rotation /flexion/ abduction / hor abduction                  PT Short Term Goals - 08/19/15 0931    PT SHORT TERM GOAL #1   Title independent with HEP   Status Achieved           PT Long Term Goals - 08/25/15 1447    PT LONG TERM GOAL #1   Title decrease pain 50% or more with ADLs   Baseline pain flare post fall 6/10   Time 6   Period Weeks   Status On-going   PT LONG TERM GOAL #2   Title increase ROM to [facility] to perform normal ADLS   Time 6   Period Weeks   Status On-going   PT LONG TERM GOAL #3   Title independent with HEP   Time 6   Period Weeks   Status On-going   PT LONG TERM GOAL #4   Title demo 4+/5 right  shoulder strength   Time 6   Period Weeks   Status Unable to assess               Plan - 08/27/15 1230    Clinical Impression Statement Pain iumproved and she was able to do the exercises with pain but manageable. pain eased with modalities. She wil continue to wear tape and remove in next 2-3 days. Progress exercixes if able next visit   PT Next Visit Plan Continue exercise as tolerated. modalities as needed   PT Home Exercise Plan Add rockwood to home   Consulted and Agree with Plan of Care Patient        Problem List Patient Active Problem List   Diagnosis Date Noted  . primary Osteoarthritis of left knee 01/23/2015  . Osteoarthritis of right knee 08/15/2014  . Cancer of lower-outer quadrant of female breast 01/23/2012  . SNORING 05/03/2007  . HYPERGLYCEMIA 05/03/2007  . PHARYNGITIS, ACUTE 02/22/2007  . HYPERPLASIA, ENDOMETRIAL NOS 02/22/2007  . DYSMENORRHEA 10/18/2006  . DEGENERATIVE JOINT DISEASE 10/18/2006  . INSOMNIA 10/18/2006  . CHEST PAIN 10/18/2006  . OBESITY 10/05/2006  . ANXIETY 10/05/2006  . HYPERTENSION 10/05/2006  . GASTROENTERITIS 10/05/2006  . VAGINAL BLEEDING 10/05/2006  . MENORRHAGIA, PERIMENOPAUSAL 10/05/2006  . PATELLO-FEMORAL SYNDROME 10/05/2006    Darrel Hoover PT 08/27/2015, 12:32 PM  [place] [facility] [address] [place], [place], [address] Phone: [phone]   Fax:  [phone]

## 2015-09-02 ENCOUNTER — Ambulatory Visit: Payer: Medicare Other | Admitting: Physical Therapy

## 2015-09-03 ENCOUNTER — Encounter: Payer: Medicare Other | Admitting: Physical Therapy

## 2015-09-07 ENCOUNTER — Ambulatory Visit: Payer: Medicare Other | Admitting: Physical Therapy

## 2015-09-07 ENCOUNTER — Encounter: Payer: Medicare Other | Admitting: Physical Therapy

## 2015-09-07 DIAGNOSIS — M6281 Muscle weakness (generalized): Secondary | ICD-10-CM

## 2015-09-07 DIAGNOSIS — M25662 Stiffness of left knee, not elsewhere classified: Secondary | ICD-10-CM

## 2015-09-07 DIAGNOSIS — M25511 Pain in right shoulder: Secondary | ICD-10-CM

## 2015-09-07 DIAGNOSIS — M25562 Pain in left knee: Secondary | ICD-10-CM

## 2015-09-07 DIAGNOSIS — M25611 Stiffness of right shoulder, not elsewhere classified: Secondary | ICD-10-CM

## 2015-09-07 NOTE — Patient Instructions (Signed)
EXTENSION: Standing - Resistance Band: Stable (Active)   Stand, right arm at side. Against yellow resistance band, draw arm backward, as far as possible, keeping elbow straight. Complete __2_ sets of __10_ repetitions. Perform __2_ sessions per day.   Resistive Band Rowing   With resistive band anchored in door, grasp both ends. Keeping elbows bent, pull back, squeezing shoulder blades together. Hold __5__ seconds. Repeat __15__ times. 2 sets  Do ___2_ sessions per day.  http://gt2.exer.us/97   Copyright  VHI. All rights reserved.  Strengthening: Resisted Internal Rotation   Hold tubing in left hand, elbow at side and forearm out. Rotate forearm in across body. Repeat _15___ times per set. Do _2___ sets per session. Do _2___ sessions per day.  http://orth.exer.us/830   Copyright  VHI. All rights reserved.  Strengthening: Resisted External Rotation   Hold tubing in right hand, elbow at side and forearm across body. Rotate forearm out. Repeat __15__ times per set. Do ___2_ sets per session. Do _2___ sessions per day.  http://orth.exer.us/828   ALSO PUNCH 2 sets 15 2 sessions per day

## 2015-09-07 NOTE — Therapy (Signed)
[place], [place], [address] Phone: [phone]   Fax:  [phone]  Physical Therapy Treatment  Patient Details  Name: Morgan Taylor MRN: [id number] Date of Birth: [birth year] Referring Provider:  Bufford Buttner, MD  Encounter Date: 09/07/2015      PT End of Session - 09/07/15 0852    Visit Number 7   Number of Visits 16   Date for PT Re-Evaluation 09/17/15   Authorization Type Medicare/medicaid   PT Start Time 0847      Past Medical History  Diagnosis Date  . Diabetes mellitus   . Hypertension   . Arthritis   . Family history of anesthesia complication     Patients son has difficulty waking up after anesthesia  . Hypothyroidism   . Anxiety   . Depression   . Cancer 2009    lumpectomy  . Hyperlipemia   . GERD (gastroesophageal reflux disease)     Past Surgical History  Procedure Laterality Date  . Appendectomy    . Shoulder arthroscopy Left     cleaned out joint  . Cesarean section    . Dilation and curettage of uterus    . Breast lumpectomy Left   . Colonoscopy    . Total knee arthroplasty Right 08/15/2014    Procedure: RIGHT TOTAL KNEE ARTHROPLASTY;  Surgeon: Yvette Rack., MD;  Location: [place];  Service: Orthopedics;  Laterality: Right;  . Total knee arthroplasty Left 01/23/2015    Procedure: TOTAL KNEE ARTHROPLASTY;  Surgeon: Yvette Rack., MD;  Location: [place];  Service: Orthopedics;  Laterality: Left;    There were no vitals filed for this visit.  Visit Diagnosis:  Right shoulder pain  Stiffness of right shoulder joint  Generalized muscle weakness  Stiffness of knee joint, left  Knee stiffness, left  Left knee pain  Knee pain, acute, left      Subjective Assessment - 09/07/15 0850    Subjective Md says I am doing good. I am hurting this morning. I have difficulty using UEs to fix hair.    Currently in Pain? Yes   Pain Score 6    Pain Location Shoulder   Pain Orientation Right;Left   Pain Descriptors / Indicators Aching   Aggravating Factors  when its going to rain   Pain Relieving Factors tape                         [facility] Adult PT Treatment/Exercise - 09/07/15 0857    Shoulder Exercises: Supine   Horizontal ABduction Both;15 reps;Theraband   Theraband Level (Shoulder Horizontal ABduction) Level 1 (Yellow)   Flexion Strengthening;Both;10 reps;Theraband   Theraband Level (Shoulder Flexion) Level 1 (Yellow)   Flexion Limitations more dificult on right   Shoulder Exercises: Standing   External Rotation Strengthening;Right;15 reps   Theraband Level (Shoulder External Rotation) Level 2 (Red)   Internal Rotation Strengthening;Right;15 reps   Theraband Level (Shoulder Internal Rotation) Level 2 (Red)   Extension Strengthening;Both;20 reps;Theraband   Theraband Level (Shoulder Extension) Level 2 (Red)   Row Strengthening;Right;15 reps   Theraband Level (Shoulder Row) Level 2 (Red)   Other Standing Exercises punches red band x15   Shoulder Exercises: ROM/Strengthening   UBE (Upper Arm Bike) Level 1.5 3 min for 3 min back   Manual Therapy   Manual Therapy Taping   Manual therapy comments 3 Y's for posture correction, deltiod and supraspinatus inhibition.  Pain improved  with tape.                PT Education - 09/07/15 0925    Education provided Yes   Education Details ROCKWOOD and scap rows, extensions red/yellow bands   Person(s) Educated Patient   Methods Explanation;Handout   Comprehension Verbalized understanding          PT Short Term Goals - 08/19/15 0931    PT SHORT TERM GOAL #1   Title independent with HEP   Status Achieved           PT Long Term Goals - 08/25/15 1447    PT LONG TERM GOAL #1   Title decrease pain 50% or more with ADLs   Baseline pain flare post fall 6/10   Time 6   Period Weeks   Status On-going   PT LONG TERM GOAL #2   Title increase ROM to [facility] to perform normal  ADLS   Time 6   Period Weeks   Status On-going   PT LONG TERM GOAL #3   Title independent with HEP   Time 6   Period Weeks   Status On-going   PT LONG TERM GOAL #4   Title demo 4+/5 right shoulder strength   Time 6   Period Weeks   Status Unable to assess               Plan - 09/07/15 0849    Clinical Impression Statement Both shoulders are hurting. MD states pt can have injections in her shoulders again soon. Instructed pt in Rockwood and scap pulls for HEP. Repeatd Kinesiotape. No increased pain post session.   PT Next Visit Plan Continue exercise as tolerated. modalities as needed; review rockwood.         Problem List Patient Active Problem List   Diagnosis Date Noted  . primary Osteoarthritis of left knee 01/23/2015  . Osteoarthritis of right knee 08/15/2014  . Cancer of lower-outer quadrant of female breast 01/23/2012  . SNORING 05/03/2007  . HYPERGLYCEMIA 05/03/2007  . PHARYNGITIS, ACUTE 02/22/2007  . HYPERPLASIA, ENDOMETRIAL NOS 02/22/2007  . DYSMENORRHEA 10/18/2006  . DEGENERATIVE JOINT DISEASE 10/18/2006  . INSOMNIA 10/18/2006  . CHEST PAIN 10/18/2006  . OBESITY 10/05/2006  . ANXIETY 10/05/2006  . HYPERTENSION 10/05/2006  . GASTROENTERITIS 10/05/2006  . VAGINAL BLEEDING 10/05/2006  . MENORRHAGIA, PERIMENOPAUSAL 10/05/2006  . PATELLO-FEMORAL SYNDROME 10/05/2006    Dorene Ar, PTA 09/07/2015, 9:30 AM  [place], [place], [address] Phone: [phone]   Fax:  [phone]

## 2015-09-09 ENCOUNTER — Ambulatory Visit: Payer: Medicare Other

## 2015-09-09 DIAGNOSIS — M25511 Pain in right shoulder: Secondary | ICD-10-CM

## 2015-09-09 DIAGNOSIS — M6281 Muscle weakness (generalized): Secondary | ICD-10-CM

## 2015-09-09 DIAGNOSIS — M25611 Stiffness of right shoulder, not elsewhere classified: Secondary | ICD-10-CM

## 2015-09-09 NOTE — Therapy (Signed)
[place], [place], [address] Phone: [phone]   Fax:  [phone]  Physical Therapy Treatment  Patient Details  Name: Morgan Taylor MRN: [id number] Date of Birth: Mar 09, 1961 Referring Provider:  Bufford Buttner, MD  Encounter Date: 09/09/2015      PT End of Session - 09/09/15 1218    Visit Number 8   Date for PT Re-Evaluation 09/17/15   PT Start Time 1150   PT Stop Time 1220   PT Time Calculation (min) 30 min   Activity Tolerance Patient tolerated treatment well;Patient limited by pain   Behavior During Therapy [facility] for tasks assessed/performed      Past Medical History  Diagnosis Date  . Diabetes mellitus   . Hypertension   . Arthritis   . Family history of anesthesia complication     Patients son has difficulty waking up after anesthesia  . Hypothyroidism   . Anxiety   . Depression   . Cancer 2009    lumpectomy  . Hyperlipemia   . GERD (gastroesophageal reflux disease)     Past Surgical History  Procedure Laterality Date  . Appendectomy    . Shoulder arthroscopy Left     cleaned out joint  . Cesarean section    . Dilation and curettage of uterus    . Breast lumpectomy Left   . Colonoscopy    . Total knee arthroplasty Right 08/15/2014    Procedure: RIGHT TOTAL KNEE ARTHROPLASTY;  Surgeon: Yvette Rack., MD;  Location: [place];  Service: Orthopedics;  Laterality: Right;  . Total knee arthroplasty Left 01/23/2015    Procedure: TOTAL KNEE ARTHROPLASTY;  Surgeon: Yvette Rack., MD;  Location: [place];  Service: Orthopedics;  Laterality: Left;    There were no vitals filed for this visit.  Visit Diagnosis:  Right shoulder pain  Stiffness of right shoulder joint  Generalized muscle weakness      Subjective Assessment - 09/09/15 1200    Subjective Doing better . Pain at 4/10   Currently in Pain? Yes   Pain Score 4    Pain Location Shoulder   Pain Orientation Right   Pain  Descriptors / Indicators Aching   Pain Type Surgical pain   Pain Onset --  sub acute now   Pain Frequency Constant   Aggravating Factors  rain , using arm   Pain Relieving Factors tape   Multiple Pain Sites No            [facility] PT Assessment - 09/09/15 1215    AROM   Right Shoulder Flexion 150 Degrees   Right Shoulder ABduction 165 Degrees   Right Shoulder External Rotation 64 Degrees                     [facility] Adult PT Treatment/Exercise - 09/09/15 1201    Neck Exercises: Machines for Strengthening   UBE (Upper Arm Bike) L1 3 min forwrd and 3 back   Shoulder Exercises: Supine   Protraction Strengthening;Right;12 reps   Protraction Weight (lbs) 4   Other Supine Exercises stabilization with small circles  x 25, hor adduction and abduction short range 2 pounds x 15   Shoulder Exercises: Seated   Other Seated Exercises biceps curls 4 pounds x 15, x10   Shoulder Exercises: Sidelying   ABduction Strengthening;Right;12 reps   ABduction Weight (lbs) 2   Shoulder Exercises: Standing   Other Standing Exercises reaching to cabinet 2 pounds to  second shelf x 10   Other Standing Exercises red band x15 4 way  rockwood                  PT Short Term Goals - 09/09/15 1222    PT SHORT TERM GOAL #1   Title independent with HEP   Status Achieved           PT Long Term Goals - 09/09/15 1222    PT LONG TERM GOAL #1   Title decrease pain 50% or more with ADLs   Status On-going   PT LONG TERM GOAL #2   Title increase ROM to [facility] to perform normal ADLS   Status Achieved   PT LONG TERM GOAL #3   Title independent with HEP   Status On-going   PT LONG TERM GOAL #4   Title demo 4+/5 right shoulder strength   Status On-going               Plan - 09/09/15 1219    Clinical Impression Statement She needed to be home to meet worker at home so tretment shortened. She did well with all exercise but limited by pain. will contkinue to push as able. LT shoulder  sore from UBE so may go back to cybex UBE   PT Next Visit Plan Continue exercise as tolerated. modalities as needed;   Consulted and Agree with Plan of Care Patient        Problem List Patient Active Problem List   Diagnosis Date Noted  . primary Osteoarthritis of left knee 01/23/2015  . Osteoarthritis of right knee 08/15/2014  . Cancer of lower-outer quadrant of female breast 01/23/2012  . SNORING 05/03/2007  . HYPERGLYCEMIA 05/03/2007  . PHARYNGITIS, ACUTE 02/22/2007  . HYPERPLASIA, ENDOMETRIAL NOS 02/22/2007  . DYSMENORRHEA 10/18/2006  . DEGENERATIVE JOINT DISEASE 10/18/2006  . INSOMNIA 10/18/2006  . CHEST PAIN 10/18/2006  . OBESITY 10/05/2006  . ANXIETY 10/05/2006  . HYPERTENSION 10/05/2006  . GASTROENTERITIS 10/05/2006  . VAGINAL BLEEDING 10/05/2006  . MENORRHAGIA, PERIMENOPAUSAL 10/05/2006  . PATELLO-FEMORAL SYNDROME 10/05/2006    Darrel Hoover PT 09/09/2015, 12:23 PM  [place] [facility] [address] [place], [place], [address] Phone: [phone]   Fax:  [phone]

## 2015-09-14 ENCOUNTER — Ambulatory Visit: Payer: Medicare Other

## 2015-09-14 DIAGNOSIS — M25511 Pain in right shoulder: Secondary | ICD-10-CM | POA: Diagnosis not present

## 2015-09-14 DIAGNOSIS — M6281 Muscle weakness (generalized): Secondary | ICD-10-CM

## 2015-09-14 NOTE — Therapy (Signed)
[place], [place], [address] Phone: [phone]   Fax:  [phone]  Physical Therapy Treatment  Patient Details  Name: Morgan Taylor MRN: [id number] Date of Birth: 1961/03/05 Referring Provider:  Bufford Buttner, MD  Encounter Date: 09/14/2015      PT End of Session - 09/14/15 1600    Visit Number 9   Number of Visits 16   Date for PT Re-Evaluation 10/09/15   PT Start Time 0400  late 15 min   PT Stop Time 0430   PT Time Calculation (min) 30 min   Activity Tolerance Patient tolerated treatment well   Behavior During Therapy [facility] for tasks assessed/performed      Past Medical History  Diagnosis Date  . Diabetes mellitus   . Hypertension   . Arthritis   . Family history of anesthesia complication     Patients son has difficulty waking up after anesthesia  . Hypothyroidism   . Anxiety   . Depression   . Cancer 2009    lumpectomy  . Hyperlipemia   . GERD (gastroesophageal reflux disease)     Past Surgical History  Procedure Laterality Date  . Appendectomy    . Shoulder arthroscopy Left     cleaned out joint  . Cesarean section    . Dilation and curettage of uterus    . Breast lumpectomy Left   . Colonoscopy    . Total knee arthroplasty Right 08/15/2014    Procedure: RIGHT TOTAL KNEE ARTHROPLASTY;  Surgeon: Yvette Rack., MD;  Location: [place];  Service: Orthopedics;  Laterality: Right;  . Total knee arthroplasty Left 01/23/2015    Procedure: TOTAL KNEE ARTHROPLASTY;  Surgeon: Yvette Rack., MD;  Location: [place];  Service: Orthopedics;  Laterality: Left;    There were no vitals filed for this visit.  Visit Diagnosis:  Right shoulder pain - Plan: PT plan of care cert/re-cert  Generalized muscle weakness - Plan: PT plan of care cert/re-cert      Subjective Assessment - 09/14/15 1558    Subjective Doing ok pain 4/10   Currently in Pain? Yes   Pain Score 4    Pain Location  Shoulder   Pain Orientation Right   Pain Descriptors / Indicators Aching   Pain Type Surgical pain   Pain Frequency Constant   Multiple Pain Sites No            [facility] PT Assessment - 09/14/15 1815    AROM   Right Shoulder Flexion 150 Degrees   Right Shoulder ABduction 165 Degrees   Right Shoulder Internal Rotation 42 Degrees   Right Shoulder External Rotation 64 Degrees   Strength   Overall Strength Comments 4/5 RT shoulder except abduciton 3+/5 all with pain more with abduciton and end range over head                     [facility] Adult PT Treatment/Exercise - 09/14/15 1559    Neck Exercises: Machines for Strengthening   UBE (Upper Arm Bike) L1 3 min forwrd and 3 back   Shoulder Exercises: Standing   Other Standing Exercises red band x15 4 way  rockwood, followed by 5 pound 2x15 reps icep curls, followed by lightly asssisted ovehead press  3 pounds 2 x 10.     Manual Therapy   Manual therapy comments 3 Y's for posture correction, deltiod and supraspinatus inhibition.  Pain improved with tape.  PT Short Term Goals - 09/09/15 1222    PT SHORT TERM GOAL #1   Title independent with HEP   Status Achieved           PT Long Term Goals - Sep 15, 2015 1601    PT LONG TERM GOAL #1   Title decrease pain 50% or more with ADLs   Status On-going   PT LONG TERM GOAL #2   Title increase ROM to [facility] to perform normal ADLS   Status Achieved   PT LONG TERM GOAL #3   Title independent with HEP   Status On-going   PT LONG TERM GOAL #4   Title demo 4+/5 right shoulder strength   Baseline due to pain   Status On-going               Plan - Sep 15, 2015 1601    Clinical Impression Statement Continue with strengthening and tape as benficial.    PT Next Visit Plan Continue exercise as tolerated. modalities as needed; tape   Consulted and Agree with Plan of Care Patient          G-Codes - 09/15/2015 1620    Functional Assessment Tool Used  clinical judgement   Functional Limitation Carrying, moving and handling objects   Carrying, Moving and Handling Objects Current Status [phone]) At least 40 percent but less than 60 percent impaired, limited or restricted   Carrying, Moving and Handling Objects Goal Status (U7253) At least 20 percent but less than 40 percent impaired, limited or restricted      Problem List Patient Active Problem List   Diagnosis Date Noted  . primary Osteoarthritis of left knee 01/23/2015  . Osteoarthritis of right knee 08/15/2014  . Cancer of lower-outer quadrant of female breast 01/23/2012  . SNORING 05/03/2007  . HYPERGLYCEMIA 05/03/2007  . PHARYNGITIS, ACUTE 02/22/2007  . HYPERPLASIA, ENDOMETRIAL NOS 02/22/2007  . DYSMENORRHEA 10/18/2006  . DEGENERATIVE JOINT DISEASE 10/18/2006  . INSOMNIA 10/18/2006  . CHEST PAIN 10/18/2006  . OBESITY 10/05/2006  . ANXIETY 10/05/2006  . HYPERTENSION 10/05/2006  . GASTROENTERITIS 10/05/2006  . VAGINAL BLEEDING 10/05/2006  . MENORRHAGIA, PERIMENOPAUSAL 10/05/2006  . PATELLO-FEMORAL SYNDROME 10/05/2006    Darrel Hoover PT 09/15/2015, 6:18 PM  [place] [facility] [address] [place], [place], [address] Phone: [phone]   Fax:  [phone]

## 2015-09-21 ENCOUNTER — Ambulatory Visit: Payer: Medicare Other

## 2015-09-23 ENCOUNTER — Ambulatory Visit: Payer: Medicare Other | Admitting: Physical Therapy

## 2015-09-23 DIAGNOSIS — M25611 Stiffness of right shoulder, not elsewhere classified: Secondary | ICD-10-CM

## 2015-09-23 DIAGNOSIS — M6281 Muscle weakness (generalized): Secondary | ICD-10-CM

## 2015-09-23 DIAGNOSIS — M25511 Pain in right shoulder: Secondary | ICD-10-CM | POA: Diagnosis not present

## 2015-09-23 NOTE — Therapy (Addendum)
[place], [place], [address] Phone: [phone]   Fax:  [phone]  Physical Therapy Treatment  Patient Details  Name: Morgan Taylor MRN: [id number] Date of Birth: 1961-08-23 Referring Provider:  Bufford Buttner, MD  Encounter Date: 09/23/2015      PT End of Session - 09/23/15 0927    Visit Number 10   Number of Visits 16   Date for PT Re-Evaluation 10/09/15   PT Start Time 0845   PT Stop Time 0928   PT Time Calculation (min) 43 min   Activity Tolerance Patient tolerated treatment well   Behavior During Therapy [facility] for tasks assessed/performed      Past Medical History  Diagnosis Date  . Diabetes mellitus   . Hypertension   . Arthritis   . Family history of anesthesia complication     Patients son has difficulty waking up after anesthesia  . Hypothyroidism   . Anxiety   . Depression   . Cancer 2009    lumpectomy  . Hyperlipemia   . GERD (gastroesophageal reflux disease)     Past Surgical History  Procedure Laterality Date  . Appendectomy    . Shoulder arthroscopy Left     cleaned out joint  . Cesarean section    . Dilation and curettage of uterus    . Breast lumpectomy Left   . Colonoscopy    . Total knee arthroplasty Right 08/15/2014    Procedure: RIGHT TOTAL KNEE ARTHROPLASTY;  Surgeon: Yvette Rack., MD;  Location: [place];  Service: Orthopedics;  Laterality: Right;  . Total knee arthroplasty Left 01/23/2015    Procedure: TOTAL KNEE ARTHROPLASTY;  Surgeon: Yvette Rack., MD;  Location: [place];  Service: Orthopedics;  Laterality: Left;    There were no vitals filed for this visit.  Visit Diagnosis:  Right shoulder pain  Stiffness of right shoulder joint  Generalized muscle weakness      Subjective Assessment - 09/23/15 0845    Subjective Lt 8/10  RT sore   Currently in Pain? No/denies   Pain Orientation Right   Pain Descriptors / Indicators Sore   Aggravating Factors   clamy weather   Pain Relieving Factors good weather tape   Multiple Pain Sites --  LT shoulder 8/11                         [facility] Adult PT Treatment/Exercise - 09/23/15 0859    Shoulder Exercises: Supine   Other Supine Exercises Stabilization 2 exercises 5 way 10 reps  with 3 LBS  serratus punch 3 LBS triceps and biceps 10 X each.  Small movements initially.   Shoulder Exercises: Prone   Extension 10 reps  3 LBS,  also rows 10 reos cues   Shoulder Exercises: Standing   Other Standing Exercises red band 4 way rockwood 10 X each.   Shoulder Exercises: ROM/Strengthening   UBE (Upper Arm Bike) Level 1.1 6 minutes   Shoulder Exercises: Stretch   Corner Stretch 3 reps;30 seconds  at doorway                  PT Short Term Goals - 09/09/15 1222    PT SHORT TERM GOAL #1   Title independent with HEP   Status Achieved           PT Long Term Goals - 09/23/15 0930    PT LONG TERM GOAL #1  Title decrease pain 50% or more with ADLs   Baseline 0-2 pain   Time 6   Status On-going   PT LONG TERM GOAL #2   Title increase ROM to [facility] to perform normal ADLS   Status Achieved   PT LONG TERM GOAL #3   Title independent with HEP   Status On-going   PT LONG TERM GOAL #4   Title demo 4+/5 right shoulder strength   Baseline using light weights in clinic   Time 6   Period Weeks   Status On-going               Plan - 09/23/15 9038    Clinical Impression Statement 2/10 Rt shoulder pain post session.  Declined ice or heat.  Patient's Mother is dying so less exercise at home.  She uses it more with home tasks so feels it gets a lot of exercise especially when LT shoulder is so painful.. RT is her good arm.   PT Next Visit Plan Continue exercise as tolerated. modalities as needed; tape   Consulted and Agree with Plan of Care Patient        Problem List Patient Active Problem List   Diagnosis Date Noted  . primary Osteoarthritis of left knee 01/23/2015   . Osteoarthritis of right knee 08/15/2014  . Cancer of lower-outer quadrant of female breast 01/23/2012  . SNORING 05/03/2007  . HYPERGLYCEMIA 05/03/2007  . PHARYNGITIS, ACUTE 02/22/2007  . HYPERPLASIA, ENDOMETRIAL NOS 02/22/2007  . DYSMENORRHEA 10/18/2006  . DEGENERATIVE JOINT DISEASE 10/18/2006  . INSOMNIA 10/18/2006  . CHEST PAIN 10/18/2006  . OBESITY 10/05/2006  . ANXIETY 10/05/2006  . HYPERTENSION 10/05/2006  . GASTROENTERITIS 10/05/2006  . VAGINAL BLEEDING 10/05/2006  . MENORRHAGIA, PERIMENOPAUSAL 10/05/2006  . PATELLO-FEMORAL SYNDROME 10/05/2006    Morgan Taylor 09/23/2015, 9:31 AM  [facility] [address] [place], [place], [address] Phone: [phone]   Fax:  [phone]     Melvenia Needles, PTA 09/23/2015 9:31 AM Phone: [phone] Fax: [phone] PHYSICAL THERAPY DISCHARGE SUMMARY  Visits from Start of Care: 12  Current functional level related to goals / functional outcomes: Unknown . See above for level at last visit   Remaining deficits: Unknown   Education / Equipment: HEP  Plan:                                                    Patient goals were partially met. Patient is being discharged due to not returning since the last visit.  ?????    Morgan Taylor 11/10/15    2:46PM

## 2015-09-28 ENCOUNTER — Ambulatory Visit: Payer: Medicare Other | Attending: Orthopedic Surgery

## 2015-09-28 DIAGNOSIS — M25611 Stiffness of right shoulder, not elsewhere classified: Secondary | ICD-10-CM | POA: Insufficient documentation

## 2015-09-28 DIAGNOSIS — M6281 Muscle weakness (generalized): Secondary | ICD-10-CM | POA: Insufficient documentation

## 2015-09-28 DIAGNOSIS — M25511 Pain in right shoulder: Secondary | ICD-10-CM | POA: Insufficient documentation

## 2015-09-30 ENCOUNTER — Other Ambulatory Visit: Payer: Self-pay | Admitting: Oncology

## 2015-09-30 ENCOUNTER — Ambulatory Visit: Payer: Medicare Other

## 2015-10-01 ENCOUNTER — Other Ambulatory Visit: Payer: Self-pay | Admitting: Hematology and Oncology

## 2015-10-01 ENCOUNTER — Telehealth: Payer: Self-pay | Admitting: *Deleted

## 2015-10-01 DIAGNOSIS — Z853 Personal history of malignant neoplasm of breast: Secondary | ICD-10-CM

## 2015-10-01 DIAGNOSIS — Z9889 Other specified postprocedural states: Secondary | ICD-10-CM

## 2015-10-01 NOTE — Telephone Encounter (Signed)
Called pt after received referral for her mom to see Dr. Lindi Adie to coordinate her f/u appt on same date and time as her mom.  Confirmed 10/07/15 appt w/ pt.

## 2015-10-05 ENCOUNTER — Ambulatory Visit: Payer: Medicare Other | Admitting: Physical Therapy

## 2015-10-05 DIAGNOSIS — M25511 Pain in right shoulder: Secondary | ICD-10-CM

## 2015-10-05 DIAGNOSIS — M25611 Stiffness of right shoulder, not elsewhere classified: Secondary | ICD-10-CM | POA: Diagnosis present

## 2015-10-05 DIAGNOSIS — M6281 Muscle weakness (generalized): Secondary | ICD-10-CM

## 2015-10-05 NOTE — Therapy (Signed)
[place], [place], [address] Phone: [phone]   Fax:  [phone]  Physical Therapy Treatment  Patient Details  Name: Morgan Taylor MRN: [id number] Date of Birth: [birth year] Referring Provider:  Bufford Buttner, MD  Encounter Date: 10/05/2015      PT End of Session - 10/05/15 0929    Visit Number 11   Number of Visits 16   Date for PT Re-Evaluation 10/09/15   PT Start Time 0850   PT Stop Time 0926   PT Time Calculation (min) 36 min   Activity Tolerance Patient tolerated treatment well;No increased pain   Behavior During Therapy [facility] for tasks assessed/performed      Past Medical History  Diagnosis Date  . Diabetes mellitus   . Hypertension   . Arthritis   . Family history of anesthesia complication     Patients son has difficulty waking up after anesthesia  . Hypothyroidism   . Anxiety   . Depression   . Cancer 2009    lumpectomy  . Hyperlipemia   . GERD (gastroesophageal reflux disease)     Past Surgical History  Procedure Laterality Date  . Appendectomy    . Shoulder arthroscopy Left     cleaned out joint  . Cesarean section    . Dilation and curettage of uterus    . Breast lumpectomy Left   . Colonoscopy    . Total knee arthroplasty Right 08/15/2014    Procedure: RIGHT TOTAL KNEE ARTHROPLASTY;  Surgeon: Yvette Rack., MD;  Location: [place];  Service: Orthopedics;  Laterality: Right;  . Total knee arthroplasty Left 01/23/2015    Procedure: TOTAL KNEE ARTHROPLASTY;  Surgeon: Yvette Rack., MD;  Location: [place];  Service: Orthopedics;  Laterality: Left;    There were no vitals filed for this visit.  Visit Diagnosis:  Right shoulder pain  Stiffness of right shoulder joint  Generalized muscle weakness      Subjective Assessment - 10/05/15 0854    Subjective Stiff.  Has a little problem lifting dishes int cabinrts.   Pain Score 3    Pain Location Shoulder   Pain  Orientation Right;Left   Pain Descriptors / Indicators Aching   Pain Frequency Constant   Aggravating Factors  moving, arthritis pain, bad weather   Pain Relieving Factors better weather   Multiple Pain Sites --  both shoulders                         [facility] Adult PT Treatment/Exercise - 10/05/15 0859    Shoulder Exercises: Prone   Extension 10 reps  3 LBS 2 sets.  10 X, leaning over counter.   Other Prone Exercises rows 4 LBS 4 LBS 10 X 2   Shoulder Exercises: Sidelying   External Rotation 10 reps  2 sets 4 LBS, with towel roll   ABduction Strengthening   ABduction Weight (lbs) 0 LBS 2 sets 10   Other Sidelying Exercises fllexion. 10 X 2 sets 0 LBS   Shoulder Exercises: ROM/Strengthening   UBE (Upper Arm Bike) L 2 6 minutes total reversing directions.   Shoulder Exercises: Stretch   Corner Stretch 3 reps;30 seconds   Other Shoulder Stretches pulleys                  PT Short Term Goals - 09/09/15 1222    PT SHORT TERM GOAL #1   Title independent with HEP  Status Achieved           PT Long Term Goals - 10/05/15 1322    PT LONG TERM GOAL #1   Title decrease pain 50% or more with ADLs   Baseline 4-3/10   Time 6   Period Weeks   Status On-going   PT LONG TERM GOAL #2   Title increase ROM to [facility] to perform normal ADLS   Status Achieved   PT LONG TERM GOAL #3   Title independent with HEP   Time 6   Period Weeks   Status On-going   PT LONG TERM GOAL #4   Title demo 4+/5 right shoulder strength   Baseline using light weights in clinic   Time 6   Period Weeks   Status On-going               Plan - 10/05/15 0930    Clinical Impression Statement Short session.  patient arrived late and left early.  Still has trouble lifting plates into cabinets.  Uses her good arm to help boost.  ERO next visit.   PT Next Visit Plan ERO        Problem List Patient Active Problem List   Diagnosis Date Noted  . primary Osteoarthritis of  left knee 01/23/2015  . Osteoarthritis of right knee 08/15/2014  . Cancer of lower-outer quadrant of female breast ([place]) 01/23/2012  . SNORING 05/03/2007  . HYPERGLYCEMIA 05/03/2007  . PHARYNGITIS, ACUTE 02/22/2007  . HYPERPLASIA, ENDOMETRIAL NOS 02/22/2007  . DYSMENORRHEA 10/18/2006  . DEGENERATIVE JOINT DISEASE 10/18/2006  . INSOMNIA 10/18/2006  . CHEST PAIN 10/18/2006  . OBESITY 10/05/2006  . ANXIETY 10/05/2006  . HYPERTENSION 10/05/2006  . GASTROENTERITIS 10/05/2006  . VAGINAL BLEEDING 10/05/2006  . MENORRHAGIA, PERIMENOPAUSAL 10/05/2006  . PATELLO-FEMORAL SYNDROME 10/05/2006    Ane Conerly 10/05/2015, 1:24 PM  [place], [place], [address] Phone: [phone]   Fax:  [phone]     Melvenia Needles, PTA 10/05/2015 1:24 PM Phone: [phone] Fax: [phone]

## 2015-10-06 ENCOUNTER — Other Ambulatory Visit: Payer: Self-pay

## 2015-10-06 DIAGNOSIS — C50519 Malignant neoplasm of lower-outer quadrant of unspecified female breast: Secondary | ICD-10-CM

## 2015-10-06 NOTE — Assessment & Plan Note (Addendum)
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 The patient is taking Arimidex daily and tolerating it well. She did have a bone scan that was negative for metastatic disease.  Arimidex toxicities: Patient does not have any side effects from Arimidex. I discussed with her that the role of extended adjuvant therapy with Arimidex is not very clear.   The results of MA 17 were discussed in detail The extension of treatment with an adjuvant aromatase inhibitor to 10 years resulted in significantly higher rates of disease-free survival and a lower incidence of contralateral breast cancer than those with placebo, but the rate of overall survival was not higher with the aromatase inhibitor than with placebo.  Breast Cancer Surveillance: 1. Breast exam 10/07/2015: Normal 2. Mammogram to be done 10/13/2015  RTC in 1 year

## 2015-10-07 ENCOUNTER — Other Ambulatory Visit (HOSPITAL_BASED_OUTPATIENT_CLINIC_OR_DEPARTMENT_OTHER): Payer: Medicare Other

## 2015-10-07 ENCOUNTER — Ambulatory Visit (HOSPITAL_BASED_OUTPATIENT_CLINIC_OR_DEPARTMENT_OTHER): Payer: Medicare Other | Admitting: Hematology and Oncology

## 2015-10-07 ENCOUNTER — Encounter: Payer: Self-pay | Admitting: Hematology and Oncology

## 2015-10-07 ENCOUNTER — Ambulatory Visit: Payer: Medicare Other

## 2015-10-07 VITALS — BP 138/67 | HR 92 | Temp 98.3°F | Resp 18 | Ht 62.0 in | Wt 191.1 lb

## 2015-10-07 DIAGNOSIS — M25511 Pain in right shoulder: Secondary | ICD-10-CM | POA: Diagnosis not present

## 2015-10-07 DIAGNOSIS — Z853 Personal history of malignant neoplasm of breast: Secondary | ICD-10-CM

## 2015-10-07 DIAGNOSIS — C50512 Malignant neoplasm of lower-outer quadrant of left female breast: Secondary | ICD-10-CM

## 2015-10-07 DIAGNOSIS — M25611 Stiffness of right shoulder, not elsewhere classified: Secondary | ICD-10-CM

## 2015-10-07 DIAGNOSIS — M6281 Muscle weakness (generalized): Secondary | ICD-10-CM

## 2015-10-07 DIAGNOSIS — C50519 Malignant neoplasm of lower-outer quadrant of unspecified female breast: Secondary | ICD-10-CM

## 2015-10-07 LAB — COMPREHENSIVE METABOLIC PANEL (CC13)
ALBUMIN: 3.7 g/dL (ref 3.5–5.0)
ALK PHOS: 96 U/L (ref 40–150)
ALT: 25 U/L (ref 0–55)
AST: 18 U/L (ref 5–34)
Anion Gap: 8 mEq/L (ref 3–11)
BUN: 13.1 mg/dL (ref 7.0–26.0)
CALCIUM: 9.4 mg/dL (ref 8.4–10.4)
CO2: 29 mEq/L (ref 22–29)
CREATININE: 0.8 mg/dL (ref 0.6–1.1)
Chloride: 106 mEq/L (ref 98–109)
EGFR: 88 mL/min/{1.73_m2} — ABNORMAL LOW (ref >90)
Glucose: 198 mg/dl — ABNORMAL HIGH (ref 70–140)
Potassium: 3.4 mEq/L — ABNORMAL LOW (ref 3.5–5.1)
Sodium: 143 mEq/L (ref 136–145)
Total Bilirubin: 0.45 mg/dL (ref 0.20–1.20)
Total Protein: 7.1 g/dL (ref 6.4–8.3)

## 2015-10-07 LAB — CBC WITH DIFFERENTIAL/PLATELET
BASO%: 0.2 % (ref 0.0–2.0)
BASOS ABS: 0 10*3/uL (ref 0.0–0.1)
EOS%: 2.1 % (ref 0.0–7.0)
Eosinophils Absolute: 0.1 10*3/uL (ref 0.0–0.5)
HEMATOCRIT: 38.5 % (ref 34.8–46.6)
HEMOGLOBIN: 12.7 g/dL (ref 11.6–15.9)
LYMPH#: 2.2 10*3/uL (ref 0.9–3.3)
LYMPH%: 33.7 % (ref 14.0–49.7)
MCH: 28.8 pg (ref 25.1–34.0)
MCHC: 33 g/dL (ref 31.5–36.0)
MCV: 87.3 fL (ref 79.5–101.0)
MONO#: 0.5 10*3/uL (ref 0.1–0.9)
MONO%: 7.7 % (ref 0.0–14.0)
NEUT#: 3.7 10*3/uL (ref 1.5–6.5)
NEUT%: 56.3 % (ref 38.4–76.8)
PLATELETS: 272 10*3/uL (ref 145–400)
RBC: 4.41 10*6/uL (ref 3.70–5.45)
RDW: 13.5 % (ref 11.2–14.5)
WBC: 6.5 10*3/uL (ref 3.9–10.3)

## 2015-10-07 NOTE — Progress Notes (Signed)
Patient Care Team: Bufford Buttner, MD as PCP - General (Family Medicine)  DIAGNOSIS: No matching staging information was found for the patient.  SUMMARY OF ONCOLOGIC HISTORY:   Primary cancer of lower outer quadrant of left female breast ([place])   01/23/2008 Initial Diagnosis Left breast invasive ductal carcinoma stage I, treated with lumpectomy followed by radiation in [name]   05/06/2008 - 10/06/2014 Anti-estrogen oral therapy Arimidex 1 mg daily    CHIEF COMPLIANT: Annual follow-up of left breast cancer  INTERVAL HISTORY: Morgan Taylor is a 54 year old with above-mentioned history of left breast cancer who completed 5 years of antiestrogen therapy with Arimidex completed in 2015. She is here for annual follow-up and surveillance. She reports no major problems or concerns. She noted a small hair follicular nodularity under the left axilla. Beyond that she feels well. Denies any major problems or concerns. However she is not exercising and doing any physical activity. She is helping take care of her grandchildren as well as her 41 year old son.  REVIEW OF SYSTEMS:   Constitutional: Denies fevers, chills or abnormal weight loss Eyes: Denies blurriness of vision Ears, nose, mouth, throat, and face: Denies mucositis or sore throat Respiratory: Denies cough, dyspnea or wheezes Cardiovascular: Denies palpitation, chest discomfort or lower extremity swelling Gastrointestinal:  Denies nausea, heartburn or change in bowel habits Skin: Denies abnormal skin rashes Lymphatics: Denies new lymphadenopathy or easy bruising Neurological:Denies numbness, tingling or new weaknesses Behavioral/Psych: Mood is stable, no new changes  Breast:  denies any pain or lumps or nodules in either breasts All other systems were reviewed with the patient and are negative.  I have reviewed the past medical history, past surgical history, social history and family history with the patient and they are unchanged from  previous note.  ALLERGIES:  is allergic to sulfonamide derivatives.  MEDICATIONS:  Current Outpatient Prescriptions  Medication Sig Dispense Refill  . amLODipine (NORVASC) 10 MG tablet Take 10 mg by mouth every morning.     Marland Kitchen atorvastatin (LIPITOR) 20 MG tablet Take 20 mg by mouth every morning.    Marland Kitchen buPROPion (WELLBUTRIN) 100 MG tablet Take 300 mg by mouth daily.    Marland Kitchen FLUoxetine (PROZAC) 20 MG tablet Take 40 mg by mouth daily.    . insulin glargine (LANTUS) 100 UNIT/ML injection Inject 24 Units into the skin at bedtime.     . Liraglutide (VICTOZA) 18 MG/3ML SOPN Inject 1.8 mg into the skin daily.    Marland Kitchen losartan (COZAAR) 100 MG tablet Take 100 mg by mouth every morning.    . metoprolol succinate (TOPROL-XL) 50 MG 24 hr tablet Take 50 mg by mouth every morning. Take with or immediately following a meal.    . omeprazole (PRILOSEC) 20 MG capsule Take 20 mg by mouth every morning.     . Oxycodone HCl 10 MG TABS Take 10 mg by mouth every 6 (six) hours. Dosage to be increased 01/22/14 to 15 mg    . oxyCODONE-acetaminophen (PERCOCET) 10-325 MG per tablet Take 1 tablet by mouth every 4 (four) hours as needed for pain.    . pioglitazone (ACTOS) 15 MG tablet Take 15 mg by mouth every morning.    . rivaroxaban (XARELTO) 10 MG TABS tablet Take 1 tablet (10 mg total) by mouth daily. 12 tablet 0  . topiramate (TOPAMAX) 100 MG tablet Take 100 mg by mouth at bedtime.     No current facility-administered medications for this visit.    PHYSICAL EXAMINATION: ECOG PERFORMANCE STATUS: 0 -  Asymptomatic  Filed Vitals:   10/07/15 1559  BP: 138/67  Pulse: 92  Temp: 98.3 F (36.8 C)  Resp: 18   Filed Weights   10/07/15 1559  Weight: 191 lb 1.6 oz (86.682 kg)    GENERAL:alert, no distress and comfortable SKIN: skin color, texture, turgor are normal, no rashes or significant lesions EYES: normal, Conjunctiva are pink and non-injected, sclera clear OROPHARYNX:no exudate, no erythema and lips, buccal  mucosa, and tongue normal  NECK: supple, thyroid normal size, non-tender, without nodularity LYMPH:  no palpable lymphadenopathy in the cervical, axillary or inguinal LUNGS: clear to auscultation and percussion with normal breathing effort HEART: regular rate & rhythm and no murmurs and no lower extremity edema ABDOMEN:abdomen soft, non-tender and normal bowel sounds Musculoskeletal:no cyanosis of digits and no clubbing  NEURO: alert & oriented x 3 with fluent speech, no focal motor/sensory deficits BREAST: No palpable masses or nodules in either right or left breasts. No palpable axillary supraclavicular or infraclavicular adenopathy no breast tenderness or nipple discharge. (exam performed in the presence of a chaperone)  LABORATORY DATA:  I have reviewed the data as listed   Chemistry      Component Value Date/Time   NA 143 10/07/2015 1540   NA 141 03/18/2015 1206   K 3.4* 10/07/2015 1540   K 3.3* 03/18/2015 1206   CL 107 03/18/2015 1206   CO2 29 10/07/2015 1540   CO2 27 03/18/2015 1206   BUN 13.1 10/07/2015 1540   BUN 9 03/18/2015 1206   CREATININE 0.8 10/07/2015 1540   CREATININE 0.94 03/18/2015 1206      Component Value Date/Time   CALCIUM 9.4 10/07/2015 1540   CALCIUM 9.3 03/18/2015 1206   ALKPHOS 96 10/07/2015 1540   ALKPHOS 103 03/18/2015 1206   AST 18 10/07/2015 1540   AST 21 03/18/2015 1206   ALT 25 10/07/2015 1540   ALT 18 03/18/2015 1206   BILITOT 0.45 10/07/2015 1540   BILITOT 0.5 03/18/2015 1206       Lab Results  Component Value Date   WBC 6.5 10/07/2015   HGB 12.7 10/07/2015   HCT 38.5 10/07/2015   MCV 87.3 10/07/2015   PLT 272 10/07/2015   NEUTROABS 3.7 10/07/2015   ASSESSMENT & PLAN:  Primary cancer of lower outer quadrant of left female breast ([place]) 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 The patient is taking Arimidex daily and tolerating it well. She did have a bone scan that was negative for  metastatic disease.  Arimidex toxicities: Patient completed Arimidex therapy in 2015.   Breast Cancer Surveillance: 1. Breast exam 10/07/2015: Normal small area under the left axilla subcutaneous nodule most likely follicular inflammation. 2. Mammogram to be done 10/13/2015  RTC in 1 year with survivorship nurse practitioner and alternate with me.   Orders Placed This Encounter  Procedures  . Amb Referral to Survivorship Long term    Referral Priority:  Routine    Referral Type:  Consultation    Number of Visits Requested:  1   The patient has a good understanding of the overall plan. she agrees with it. she will call with any problems that may develop before the next visit here.   Rulon Eisenmenger, MD 10/07/2015

## 2015-10-07 NOTE — Therapy (Addendum)
[place], [place], [address] Phone: [phone]   Fax:  [phone]  Physical Therapy Treatment/Discharge  Patient Details  Name: Morgan Taylor MRN: [id number] Date of Birth: 1961-07-07 Referring Provider:  Bufford Buttner, MD  Encounter Date: 10/07/2015      PT End of Session - 10/07/15 0923    Visit Number 12   Number of Visits 16   Date for PT Re-Evaluation 10/23/15   PT Start Time 0845   PT Stop Time 0925   PT Time Calculation (min) 40 min   Activity Tolerance Patient tolerated treatment well   Behavior During Therapy [facility] for tasks assessed/performed      Past Medical History  Diagnosis Date  . Diabetes mellitus   . Hypertension   . Arthritis   . Family history of anesthesia complication     Patients son has difficulty waking up after anesthesia  . Hypothyroidism   . Anxiety   . Depression   . Cancer 2009    lumpectomy  . Hyperlipemia   . GERD (gastroesophageal reflux disease)     Past Surgical History  Procedure Laterality Date  . Appendectomy    . Shoulder arthroscopy Left     cleaned out joint  . Cesarean section    . Dilation and curettage of uterus    . Breast lumpectomy Left   . Colonoscopy    . Total knee arthroplasty Right 08/15/2014    Procedure: RIGHT TOTAL KNEE ARTHROPLASTY;  Surgeon: Yvette Rack., MD;  Location: [place];  Service: Orthopedics;  Laterality: Right;  . Total knee arthroplasty Left 01/23/2015    Procedure: TOTAL KNEE ARTHROPLASTY;  Surgeon: Yvette Rack., MD;  Location: [place];  Service: Orthopedics;  Laterality: Left;    There were no vitals filed for this visit.  Visit Diagnosis:  Right shoulder pain - Plan: PT plan of care cert/re-cert  Stiffness of right shoulder joint - Plan: PT plan of care cert/re-cert  Generalized muscle weakness - Plan: PT plan of care cert/re-cert      Subjective Assessment - 10/07/15 0855    Subjective A little pain    Currently in Pain? Yes   Pain Score 2             [facility] PT Assessment - 10/07/15 0858    AROM   Right Shoulder Flexion 150 Degrees   Right Shoulder ABduction 160 Degrees   Right Shoulder Internal Rotation 40 Degrees   Right Shoulder External Rotation 56 Degrees                     [facility] Adult PT Treatment/Exercise - 10/07/15 0856    Neck Exercises: Machines for Strengthening   UBE (Upper Arm Bike) L1 3 min forwrd and 3 back   Shoulder Exercises: Supine   Protraction Limitations RT arm bench press 5 pounds 2x10   Flexion Strengthening;Right;20 reps   Shoulder Flexion Weight (lbs) 3   Shoulder Exercises: Sidelying   External Rotation Strengthening;20 reps;Right   External Rotation Weight (lbs) 3   ABduction Strengthening;Right;20 reps   ABduction Weight (lbs) 3   Shoulder Exercises: Standing   External Rotation Both;20 reps   Theraband Level (Shoulder External Rotation) Level 3 (Green)   Internal Rotation Strengthening;Both;20 reps   Theraband Level (Shoulder Internal Rotation) Level 3 (Green)   Extension AAROM;Both;20 reps   Theraband Level (Shoulder Extension) Level 3 (Green)  PT Short Term Goals - 09/09/15 1222    PT SHORT TERM GOAL #1   Title independent with HEP   Status Achieved           PT Long Term Goals - 10/07/15 0903    PT LONG TERM GOAL #1   Title decrease pain 50% or more with ADLs   Baseline 80%. Constant pain from prior to surgery is gone   Status Achieved   PT LONG TERM GOAL #2   Title increase ROM to [facility] to perform normal ADLS   Status Achieved   PT LONG TERM GOAL #4   Title demo 4+/5 right shoulder strength   Baseline 4+/5 or better when tested below 45 degrees   Status Achieved               Plan - 10/07/15 0928    Clinical Impression Statement [name] continues wiht limited motion but her LT shouder is also stiff so OA may be limiting factor. Pain is  much improved since eval  but  still limits use of RT arm. Strength is improved also now 4+/5 minimally. She is able to complete session without need for ice pack.    PT Frequency 2x / week   PT Duration 2 weeks   PT Next Visit Plan Continue strength until end of month and return to MD   Consulted and Agree with Plan of Care Patient        Problem List Patient Active Problem List   Diagnosis Date Noted  . primary Osteoarthritis of left knee 01/23/2015  . Osteoarthritis of right knee 08/15/2014  . Primary cancer of lower outer quadrant of left female breast ([place]) 01/23/2012  . SNORING 05/03/2007  . HYPERGLYCEMIA 05/03/2007  . PHARYNGITIS, ACUTE 02/22/2007  . HYPERPLASIA, ENDOMETRIAL NOS 02/22/2007  . DYSMENORRHEA 10/18/2006  . DEGENERATIVE JOINT DISEASE 10/18/2006  . INSOMNIA 10/18/2006  . CHEST PAIN 10/18/2006  . OBESITY 10/05/2006  . ANXIETY 10/05/2006  . HYPERTENSION 10/05/2006  . GASTROENTERITIS 10/05/2006  . VAGINAL BLEEDING 10/05/2006  . MENORRHAGIA, PERIMENOPAUSAL 10/05/2006  . PATELLO-FEMORAL SYNDROME 10/05/2006    Darrel Hoover PT 10/07/2015, 9:34 AM  [facility] [address] [place], [place], [address] Phone: [phone]   Fax:  [phone]     PHYSICAL THERAPY DISCHARGE SUMMARY  Visits from Start of Care: 12  Current functional level related to goals / functional outcomes: Unknown but she has been seen in Cancer rehab since this visit   Remaining deficits: Unknown   Education / Equipment: HEP  Plan:                                                    Patient goals were met. Patient is being discharged due to not returning since the last visit.  ?????    Darrel Hoover, PT  03/22/15    9:28 AM

## 2015-10-08 ENCOUNTER — Telehealth: Payer: Self-pay | Admitting: Hematology and Oncology

## 2015-10-08 NOTE — Telephone Encounter (Signed)
Appointments made and avs pritned and mailed to patient

## 2015-10-12 ENCOUNTER — Ambulatory Visit: Payer: Medicare Other

## 2015-10-13 ENCOUNTER — Ambulatory Visit
Admission: RE | Admit: 2015-10-13 | Discharge: 2015-10-13 | Disposition: A | Discharge status: Home / Self Care | Payer: Medicare Other | Source: Ambulatory Visit | Attending: Hematology and Oncology | Admitting: Hematology and Oncology

## 2015-10-13 DIAGNOSIS — Z853 Personal history of malignant neoplasm of breast: Secondary | ICD-10-CM

## 2015-10-13 DIAGNOSIS — Z9889 Other specified postprocedural states: Secondary | ICD-10-CM

## 2015-10-14 ENCOUNTER — Ambulatory Visit: Payer: Medicare Other

## 2015-10-15 ENCOUNTER — Ambulatory Visit: Payer: Medicare Other | Admitting: Adult Health

## 2015-10-15 ENCOUNTER — Other Ambulatory Visit: Payer: Medicare Other

## 2015-10-15 ENCOUNTER — Ambulatory Visit: Payer: Medicare Other | Admitting: Hematology and Oncology

## 2015-11-09 ENCOUNTER — Telehealth: Payer: Self-pay | Admitting: Hematology and Oncology

## 2015-11-09 ENCOUNTER — Other Ambulatory Visit: Payer: Self-pay

## 2015-11-09 DIAGNOSIS — C50512 Malignant neoplasm of lower-outer quadrant of left female breast: Secondary | ICD-10-CM

## 2015-11-09 NOTE — Telephone Encounter (Signed)
Called and left a message with genetics appointment °

## 2015-11-25 ENCOUNTER — Ambulatory Visit (HOSPITAL_BASED_OUTPATIENT_CLINIC_OR_DEPARTMENT_OTHER): Payer: Medicare Other | Admitting: Genetic Counselor

## 2015-11-25 ENCOUNTER — Other Ambulatory Visit: Payer: Medicare Other

## 2015-11-25 ENCOUNTER — Encounter: Payer: Self-pay | Admitting: Genetic Counselor

## 2015-11-25 DIAGNOSIS — Z853 Personal history of malignant neoplasm of breast: Secondary | ICD-10-CM | POA: Diagnosis present

## 2015-11-25 DIAGNOSIS — Z803 Family history of malignant neoplasm of breast: Secondary | ICD-10-CM

## 2015-11-25 DIAGNOSIS — Z315 Encounter for genetic counseling: Secondary | ICD-10-CM

## 2015-11-25 DIAGNOSIS — Z800 Family history of malignant neoplasm of digestive organs: Secondary | ICD-10-CM

## 2015-11-25 DIAGNOSIS — C50512 Malignant neoplasm of lower-outer quadrant of left female breast: Secondary | ICD-10-CM

## 2015-11-25 NOTE — Progress Notes (Signed)
REFERRING PROVIDER: Bufford Buttner, MD [place], [place] [address]   Morgan Lose, MD  PRIMARY PROVIDER:  Bufford Buttner, MD  PRIMARY REASON FOR VISIT:  1. 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 Family history of breast cancer   3. Family history of pancreatic cancer      HISTORY OF PRESENT ILLNESS:   Morgan Taylor, a 54 y.o. female, was seen for a [facility] cancer genetics consultation at the request of Morgan Taylor due to a personal and family history of cancer.  Morgan Taylor presents to clinic today to discuss the possibility of a hereditary predisposition to cancer, genetic testing, and to further clarify her future cancer risks, as well as potential cancer risks for family members.   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 This was treated with surgery, chemo and radiation.  I had seen her last year and she did not meet medical criteria for genetic testing. In the mean time, her mother was diagnosed with breast cancer resulting in Morgan Taylor meeting Medicare criteria for testing.   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  First live birth at age 62.  OCP use for approximately 10 years.  Ovaries intact: yes.  Hysterectomy: no.  Menopausal status: perimenopausal.  HRT use: 0 years. Colonoscopy: yes; not examined. Mammogram within the last year: yes. Number of breast biopsies: 1. Up to date with pelvic exams:  yes. Any excessive radiation exposure in the past:  no  Past Medical History  Diagnosis Date  . Diabetes mellitus   . Hypertension   . Arthritis   .  Family history of anesthesia complication     Patients son has difficulty waking up after anesthesia  . Hypothyroidism   . Anxiety   . Depression   . Cancer [facility]) 2009    lumpectomy  . Hyperlipemia   . GERD (gastroesophageal reflux disease)   . Family history of breast cancer   . Family history of pancreatic cancer     Past Surgical History  Procedure Laterality Date  . Appendectomy    . Shoulder arthroscopy Left     cleaned out joint  . Cesarean section    . Dilation and curettage of uterus    . Breast lumpectomy Left   . Colonoscopy    . Total knee arthroplasty Right 08/15/2014    Procedure: RIGHT TOTAL KNEE ARTHROPLASTY;  Surgeon: Yvette Rack., MD;  Location: [facility];  Service: Orthopedics;  Laterality: Right;  . Total knee arthroplasty Left 01/23/2015    Procedure: TOTAL KNEE ARTHROPLASTY;  Surgeon: Yvette Rack., MD;  Location: [place];  Service: Orthopedics;  Laterality: Left;    Social History   Social History  . Marital Status: Divorced    Spouse Name: N/A  . Number of Children: 5  . Years of Education: N/A   Social History Main Topics  . Smoking status: Former Research scientist (life sciences)  . Smokeless tobacco: Never Used  . Alcohol Use: No  . Drug Use: No  . Sexual Activity: Not Currently   Other Topics Concern  .  None   Social History Narrative     FAMILY HISTORY:  We obtained a detailed, 4-generation family history.  Significant diagnoses are listed below: Family History  Problem Relation Age of Onset  . Colon cancer Neg Hx   . Rectal cancer Neg Hx   . Stomach cancer Neg Hx   . Cancer Paternal Grandmother     NOS  . Pancreatic cancer Paternal Aunt   . Cancer Paternal Aunt     NOS  . Breast cancer Mother 21    Stage 3   The patient has four daughters and one son.  Her oldest daughter started having mammograms this year due to the family history.  The patient has one brother and one sister who are cancer free.  Her mother was diagnosed with breast cancer at age 11.   Her mother had two sisters who are cancer free, and there is no other reported family history on this side of the family other than a remote history of cancer in her mother's maternal first cousin.  The patient's father is alive and cancer free. He has four brothers and five sisters.  One sister had pancreatic cancer and another had an unknown form of cancer.  His mother also had an unknown form of cancer.  Patient's maternal ancestors are of [place] descent, and paternal ancestors are of [place] descent. There is no reported Ashkenazi Jewish ancestry. There is no known consanguinity.  GENETIC COUNSELING ASSESSMENT: Morgan Taylor is a 54 y.o. female with a personal and family history of breast cancer which somewhat suggestive of a hereditary cancer syndrome and predisposition to cancer. We, therefore, discussed and recommended the following at today's visit.   DISCUSSION: We discussed that about 5-10% of breast cancer is hereditary and most of these are due to BRCA mutations. Other genes we see outside of BRCA mutations would be CHEK2, ATM and PALB2 mutations.  We reviewed the characteristics, features and inheritance patterns of hereditary cancer syndromes. We also discussed genetic testing, including the appropriate family members to test, the process of testing, insurance coverage and turn-around-time for results. We discussed the implications of a negative, positive and/or variant of uncertain significant result. We recommended Ms. Rise pursue genetic testing for the Breast/Ovarian cancer gene panel. The Breast/Ovarian gene panel offered by [facility] includes sequencing and rearrangement analysis for the following 20 genes:  ATM, BARD1, BRCA1, BRCA2, BRIP1, CDH1, CHEK2, EPCAM, FANCC, MLH1, MSH2, MSH6, NBN, PALB2, PMS2, PTEN, RAD51C, RAD51D, TP53, and XRCC2.     Based on Morgan Taylor's personal and family history of cancer, she meets medical criteria for genetic testing. Despite that she  meets criteria, she may still have an out of pocket cost. We discussed that if her out of pocket cost for testing is over $100, the laboratory will call and confirm whether she wants to proceed with testing.  If the out of pocket cost of testing is less than $100 she will be billed by the genetic testing laboratory.   PLAN: After considering the risks, benefits, and limitations, Ms. Narain  provided informed consent to pursue genetic testing and the blood sample was sent to [facility] for analysis of the Breast/Ovarian cancer syndrome. Results should be available within approximately 2-3 weeks' time, at which point they will be disclosed by telephone to Ms. Obriant, as will any additional recommendations warranted by these results. Ms. Whiting will receive a summary of her genetic counseling visit and a copy of her results once available.  This information will also be available in Epic. We encouraged Ms. Luther to remain in contact with cancer genetics annually so that we can continuously update the family history and inform her of any changes in cancer genetics and testing that may be of benefit for her family. Ms. Petrich questions were answered to her satisfaction today. Our contact information was provided should additional questions or concerns arise.  Lastly, we encouraged Ms. Ridings to remain in contact with cancer genetics annually so that we can continuously update the family history and inform her of any changes in cancer genetics and testing that may be of benefit for this family.   Ms.  Penafiel questions were answered to her satisfaction today. Our contact information was provided should additional questions or concerns arise. Thank you for the referral and allowing [place] to share in the care of your patient.   Morgan Taylor P. Florene Glen, [place], [facility] Certified Genetic Counselor [email or web address] phone: [phone]  The patient was seen for a total of 30 minutes in face-to-face genetic counseling.   This patient was discussed with Drs. Magrinat, Lindi Adie and/or Burr Medico who agrees with the above.    _______________________________________________________________________ For Office Staff:  Number of people involved in session: 3 Was an Intern/ student involved with case: no

## 2015-12-11 ENCOUNTER — Encounter: Payer: Self-pay | Admitting: Genetic Counselor

## 2015-12-11 DIAGNOSIS — Z1379 Encounter for other screening for genetic and chromosomal anomalies: Secondary | ICD-10-CM | POA: Insufficient documentation

## 2015-12-14 ENCOUNTER — Telehealth: Payer: Self-pay | Admitting: Genetic Counselor

## 2015-12-14 NOTE — Telephone Encounter (Signed)
Revealed that two VUS were found in ATM and MSH6 but that the test was otherwise negative.

## 2015-12-22 ENCOUNTER — Ambulatory Visit: Payer: Self-pay | Admitting: Genetic Counselor

## 2015-12-22 DIAGNOSIS — C50512 Malignant neoplasm of lower-outer quadrant of left female breast: Secondary | ICD-10-CM

## 2015-12-22 DIAGNOSIS — Z803 Family history of malignant neoplasm of breast: Secondary | ICD-10-CM

## 2015-12-22 DIAGNOSIS — Z800 Family history of malignant neoplasm of digestive organs: Secondary | ICD-10-CM

## 2015-12-22 DIAGNOSIS — Z1379 Encounter for other screening for genetic and chromosomal anomalies: Secondary | ICD-10-CM

## 2015-12-22 NOTE — Progress Notes (Addendum)
HPI: Ms. Whisnant was previously seen in the [place] clinic due to a personal and family history of cancer and concerns regarding a hereditary predisposition to cancer. Please refer to our prior cancer genetics clinic note for more information regarding Ms. Brozowski's medical, social and family histories, and our assessment and recommendations, at the time. Ms. Ludemann recent genetic test results were disclosed to her, as were recommendations warranted by these results. These results and recommendations are discussed in more detail below.  FAMILY HISTORY:  We obtained a detailed, 4-generation family history.  Significant diagnoses are listed below: Family History  Problem Relation Age of Onset  . Colon cancer Neg Hx   . Rectal cancer Neg Hx   . Stomach cancer Neg Hx   . Cancer Paternal Grandmother     NOS  . Pancreatic cancer Paternal Aunt   . Cancer Paternal Aunt     NOS  . Breast cancer Mother 78    Stage 3    The patient has four daughters and one son. Her oldest daughter started having mammograms this year due to the family history. The patient has one brother and one sister who are cancer free. Her mother was diagnosed with breast cancer at age 2. Her mother had two sisters who are cancer free, and there is no other reported family history on this side of the family other than a remote history of cancer in her mother's maternal first cousin. The patient's father is alive and cancer free. He has four brothers and five sisters. One sister had pancreatic cancer and another had an unknown form of cancer. His mother also had an unknown form of cancer. Patient's maternal ancestors are of [place] descent, and paternal ancestors are of [place] descent. There is no reported Ashkenazi Jewish ancestry. There is no known consanguinity.  GENETIC TEST RESULTS: At the time of Ms. Wale's visit, we recommended she pursue genetic testing of the Breast/Ovarian  cancer gene panel. The Breast/Ovarian gene panel offered by [facility] includes sequencing and rearrangement analysis for the following 20 genes:  ATM, BARD1, BRCA1, BRCA2, BRIP1, CDH1, CHEK2, EPCAM, FANCC, MLH1, MSH2, MSH6, NBN, PALB2, PMS2, PTEN, RAD51C, RAD51D, TP53, and XRCC2.   The report date is December 11, 2015.  Genetic testing was normal, and did not reveal a deleterious mutation in these genes. The test report has been scanned into EPIC and is located under the Molecular Pathology section of the Results Review tab.   We discussed with Ms. Engh that since the current genetic testing is not perfect, it is possible there may be a gene mutation in one of these genes that current testing cannot detect, but that chance is small. We also discussed, that it is possible that another gene that has not yet been discovered, or that we have not yet tested, is responsible for the cancer diagnoses in the family, and it is, therefore, important to remain in touch with cancer genetics in the future so that we can continue to offer Ms. Illingworth the most up to date genetic testing.   Genetic testing did detect two Variants of Unknown Significance - the first is in the ATM gene called c.4424A>G and the second is in the MSH6 gene called c.1019T>C. At this time, it is unknown if these variants are associated with increased cancer risk or if this is a normal finding, but most variants such as this get reclassified to being inconsequential. It should not be used to make medical  management decisions. With time, we suspect the lab will determine the significance of this variant, if any. If we do learn more about it, we will try to contact Ms. Klett to discuss it further. However, it is important to stay in touch with [place] periodically and keep the address and phone number up to date.  ATM c.4424A>G VUS has been reclassified to likely benign based on a combination of sources, e.g. Internal data, published literature, population  databases and in silico models.  The report date is Apr 25, 2017.   CANCER SCREENING RECOMMENDATIONS: This result is reassuring and indicates that Ms. Quakenbush likely does not have an increased risk for a future cancer due to a mutation in one of these genes. This normal test also suggests that Ms. Esty's cancer was most likely not due to an inherited predisposition associated with one of these genes.  Most cancers happen by chance and this negative test suggests that her cancer falls into this category.  We, therefore, recommended she continue to follow the cancer management and screening guidelines provided by her oncology and primary healthcare provider.   RECOMMENDATIONS FOR FAMILY MEMBERS: Women in this family might be at some increased risk of developing cancer, over the general population risk, simply due to the family history of cancer. We recommended women in this family have a yearly mammogram beginning at age 73, or 82 years younger than the earliest onset of cancer, an an annual clinical breast exam, and perform monthly breast self-exams. Women in this family should also have a gynecological exam as recommended by their primary provider. All family members should have a colonoscopy by age 39.  FOLLOW-UP: Lastly, we discussed with Ms. Chern that cancer genetics is a rapidly advancing field and it is possible that new genetic tests will be appropriate for her and/or her family members in the future. We encouraged her to remain in contact with cancer genetics on an annual basis so we can update her personal and family histories and let her know of advances in cancer genetics that may benefit this family.   Our contact number was provided. Ms. Mizner questions were answered to her satisfaction, and she knows she is welcome to call us at anytime with additional questions or concerns.   Roma Kayser, MS, [facility] Certified Genetic Counselor [email or web address]

## 2016-01-22 ENCOUNTER — Encounter (HOSPITAL_COMMUNITY): Payer: Self-pay

## 2016-02-03 ENCOUNTER — Other Ambulatory Visit: Payer: Self-pay | Admitting: *Deleted

## 2016-02-03 ENCOUNTER — Other Ambulatory Visit: Payer: Self-pay | Admitting: Orthopedic Surgery

## 2016-02-03 ENCOUNTER — Telehealth: Payer: Self-pay | Admitting: Hematology and Oncology

## 2016-02-03 ENCOUNTER — Telehealth: Payer: Self-pay

## 2016-02-03 DIAGNOSIS — M25511 Pain in right shoulder: Secondary | ICD-10-CM

## 2016-02-03 DIAGNOSIS — M25512 Pain in left shoulder: Principal | ICD-10-CM

## 2016-02-03 NOTE — Telephone Encounter (Signed)
Left breast tender, skin around nipple is stiff. Whole breast is tender 4/10, achey, burning sensation. This has been about 1.5 weeks, now is intensified. No drainage at present. Has had lymphedema in her breast in past where it needed draining.

## 2016-02-03 NOTE — Telephone Encounter (Signed)
pof sent for office visit tomorrow, patient aware of date/time

## 2016-02-03 NOTE — Telephone Encounter (Signed)
Made 2/9 appointment per 2/8 pof. Patient aware per pof

## 2016-02-04 ENCOUNTER — Ambulatory Visit (HOSPITAL_BASED_OUTPATIENT_CLINIC_OR_DEPARTMENT_OTHER): Payer: Medicare Other | Admitting: Hematology and Oncology

## 2016-02-04 ENCOUNTER — Other Ambulatory Visit: Payer: Self-pay | Admitting: Hematology and Oncology

## 2016-02-04 ENCOUNTER — Encounter: Payer: Self-pay | Admitting: Hematology and Oncology

## 2016-02-04 VITALS — BP 156/79 | HR 105 | Temp 98.1°F | Resp 18 | Ht 62.0 in | Wt 199.0 lb

## 2016-02-04 DIAGNOSIS — Z853 Personal history of malignant neoplasm of breast: Secondary | ICD-10-CM | POA: Diagnosis not present

## 2016-02-04 DIAGNOSIS — I890 Lymphedema, not elsewhere classified: Secondary | ICD-10-CM

## 2016-02-04 DIAGNOSIS — C50512 Malignant neoplasm of lower-outer quadrant of left female breast: Secondary | ICD-10-CM

## 2016-02-04 NOTE — Assessment & Plan Note (Signed)
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 The patient is taking Arimidex daily and tolerating it well. She did have a bone scan that was negative for metastatic disease. Arimidex toxicities: Patient completed Arimidex therapy in 2015.   Breast Cancer Surveillance: 1. Breast exam 10/07/2015:  Tenderness of the left breast with burning sensation. Breast lymphedema 2. Mammogram to be done 10/13/2015  RTC in 1 year with survivorship

## 2016-02-04 NOTE — Progress Notes (Signed)
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 In 2023/01/13 her mother passed away after battling breast cancer for a long time. A day later her sister passed away on the couch possibly from an overdose of medications. She has been through a lot of difficult time during these days. Last week she started noticing increasing tenderness and discomfort in the left breast. It is more of a burning sensation accompanied by pain. She called and wanted to see [place] urgently for further evaluation. It is constant in nature. There are no aggravating or relieving factors.  REVIEW OF SYSTEMS:   Constitutional: Denies fevers, chills or abnormal weight loss Eyes: Denies blurriness of vision Ears, nose, mouth, throat, and face: Denies mucositis or sore throat Respiratory: Denies cough, dyspnea or wheezes Cardiovascular: Denies palpitation, chest discomfort Gastrointestinal:  Denies nausea, heartburn or change in bowel habits Skin: Denies abnormal skin rashes Lymphatics: Denies new lymphadenopathy or easy bruising Neurological:Denies numbness, tingling or new weaknesses Behavioral/Psych: Mood is stable, no new changes  Extremities: No lower extremity edema Breast:pain and discomfort in the left breast with breast lymphedema. All other systems were reviewed with the patient and are negative.  I have reviewed the  past medical history, past surgical history, social history and family history with the patient and they are unchanged from previous note.  ALLERGIES:  is allergic to sulfonamide derivatives.  MEDICATIONS:  Current Outpatient Prescriptions  Medication Sig Dispense Refill  . amLODipine (NORVASC) 10 MG tablet Take 10 mg by mouth every morning.     Marland Kitchen atorvastatin (LIPITOR) 20 MG tablet Take 20 mg by mouth every morning.    Marland Kitchen buPROPion (WELLBUTRIN) 100 MG tablet Take 300 mg by mouth daily.    Marland Kitchen FLUoxetine (PROZAC) 20 MG tablet Take 40 mg by mouth daily.    . insulin glargine (LANTUS) 100 UNIT/ML injection Inject 24 Units into the skin at bedtime.     . Liraglutide (VICTOZA) 18 MG/3ML SOPN Inject 1.8 mg into the skin daily.    Marland Kitchen losartan (COZAAR) 100 MG tablet Take 100 mg by mouth every morning.    . metoprolol succinate (TOPROL-XL) 50 MG 24 hr tablet Take 50 mg by mouth every morning. Take with or immediately following a meal.    . omeprazole (PRILOSEC) 20 MG capsule Take 20 mg by mouth every morning.     . Oxycodone HCl 10 MG TABS Take 10 mg by mouth every 6 (six) hours. Dosage to be increased 01/22/14 to 15 mg    . oxyCODONE-acetaminophen (PERCOCET) 10-325 MG per tablet Take 1 tablet by mouth every 4 (four) hours as needed for pain.    . pioglitazone (ACTOS) 15 MG tablet Take 15 mg by mouth every morning.    . rivaroxaban (XARELTO) 10 MG TABS tablet Take 1 tablet (10 mg total) by mouth daily. 12 tablet 0  . topiramate (TOPAMAX) 100 MG tablet Take 100 mg by mouth at bedtime.  No current facility-administered medications for this visit.    PHYSICAL EXAMINATION: ECOG PERFORMANCE STATUS: 1 - Symptomatic but completely ambulatory  Filed Vitals:   02/04/16 0957  BP: 156/79  Pulse: 105  Temp: 98.1 F (36.7 C)  Resp: 18   Filed Weights   02/04/16 0957  Weight: 199 lb (90.266 kg)    GENERAL:alert, no distress and comfortable SKIN: skin color, texture, turgor are normal, no rashes or  significant lesions EYES: normal, Conjunctiva are pink and non-injected, sclera clear OROPHARYNX:no exudate, no erythema and lips, buccal mucosa, and tongue normal  NECK: supple, thyroid normal size, non-tender, without nodularity LYMPH:  no palpable lymphadenopathy in the cervical, axillary or inguinal LUNGS: clear to auscultation and percussion with normal breathing effort HEART: regular rate & rhythm and no murmurs and no lower extremity edema ABDOMEN:abdomen soft, non-tender and normal bowel sounds MUSCULOSKELETAL:no cyanosis of digits and no clubbing  NEURO: alert & oriented x 3 with fluent speech, no focal motor/sensory deficits EXTREMITIES: No lower extremity edema BREAST:left breast mild lymphedema around the nipple and areola. Scar tissue on the lower inner quadrant from prior surgery. Left breast is tender to palpation but mostly uncomfortable. (exam performed in the presence of a chaperone)  LABORATORY DATA:  I have reviewed the data as listed   Chemistry      Component Value Date/Time   NA 143 10/07/2015 1540   NA 141 03/18/2015 1206   K 3.4* 10/07/2015 1540   K 3.3* 03/18/2015 1206   CL 107 03/18/2015 1206   CO2 29 10/07/2015 1540   CO2 27 03/18/2015 1206   BUN 13.1 10/07/2015 1540   BUN 9 03/18/2015 1206   CREATININE 0.8 10/07/2015 1540   CREATININE 0.94 03/18/2015 1206      Component Value Date/Time   CALCIUM 9.4 10/07/2015 1540   CALCIUM 9.3 03/18/2015 1206   ALKPHOS 96 10/07/2015 1540   ALKPHOS 103 03/18/2015 1206   AST 18 10/07/2015 1540   AST 21 03/18/2015 1206   ALT 25 10/07/2015 1540   ALT 18 03/18/2015 1206   BILITOT 0.45 10/07/2015 1540   BILITOT 0.5 03/18/2015 1206       Lab Results  Component Value Date   WBC 6.5 10/07/2015   HGB 12.7 10/07/2015   HCT 38.5 10/07/2015   MCV 87.3 10/07/2015   PLT 272 10/07/2015   NEUTROABS 3.7 10/07/2015   ASSESSMENT & PLAN:  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 The patient is taking Arimidex daily and tolerating it well. She did have a bone scan that was negative for metastatic disease. Arimidex toxicities: Patient completed Arimidex therapy in 2015.   Breast Cancer Surveillance: 1. Breast exam 10/07/2015:  Tenderness of the left breast with burning sensation. Breast lymphedema.  I recommended obtaining a left breast diagnostic mammogram to evaluate the cause of her discomfort. Provider if prescription for [facility] Referral to PT/OT for breast lymphedema management  2. Mammogram on both breasts to be done 10/13/2015  RTC as scheduled with survivorship unless there are any problems or concerns.   Orders Placed This Encounter  Procedures  . MM Digital Diagnostic Unilat L    Standing Status: Future     Number of Occurrences:      Standing Expiration Date: 02/03/2017    Order Specific Question:  Reason for Exam (SYMPTOM  OR DIAGNOSIS REQUIRED)    Answer:  Left breast  swelling and pain    Order Specific Question:  Is the patient pregnant?    Answer:  No    Order Specific Question:  Preferred imaging location?    Answer:  [facility]   The patient has a good understanding of the overall plan. she agrees with it. she will call with any problems that may develop before the next visit here.   Rulon Eisenmenger, MD 02/04/2016

## 2016-02-05 ENCOUNTER — Ambulatory Visit
Admission: RE | Admit: 2016-02-05 | Discharge: 2016-02-05 | Disposition: A | Discharge status: Home / Self Care | Payer: Medicare Other | Source: Ambulatory Visit | Attending: Orthopedic Surgery | Admitting: Orthopedic Surgery

## 2016-02-05 DIAGNOSIS — M25512 Pain in left shoulder: Principal | ICD-10-CM

## 2016-02-05 DIAGNOSIS — M25511 Pain in right shoulder: Secondary | ICD-10-CM

## 2016-02-08 ENCOUNTER — Other Ambulatory Visit: Payer: Self-pay | Admitting: Orthopedic Surgery

## 2016-02-10 ENCOUNTER — Ambulatory Visit
Admission: RE | Admit: 2016-02-10 | Discharge: 2016-02-10 | Disposition: A | Discharge status: Home / Self Care | Payer: Medicare Other | Source: Ambulatory Visit | Attending: Hematology and Oncology | Admitting: Hematology and Oncology

## 2016-02-10 ENCOUNTER — Encounter (HOSPITAL_COMMUNITY): Payer: Self-pay

## 2016-02-10 ENCOUNTER — Other Ambulatory Visit: Payer: Self-pay | Admitting: Hematology and Oncology

## 2016-02-10 ENCOUNTER — Encounter (HOSPITAL_COMMUNITY)
Admission: RE | Admit: 2016-02-10 | Discharge: 2016-02-10 | Disposition: A | Discharge status: Home / Self Care | Payer: Medicare Other | Source: Ambulatory Visit | Attending: Orthopedic Surgery | Admitting: Orthopedic Surgery

## 2016-02-10 DIAGNOSIS — Z01812 Encounter for preprocedural laboratory examination: Secondary | ICD-10-CM | POA: Insufficient documentation

## 2016-02-10 DIAGNOSIS — M19012 Primary osteoarthritis, left shoulder: Secondary | ICD-10-CM | POA: Diagnosis not present

## 2016-02-10 DIAGNOSIS — C50512 Malignant neoplasm of lower-outer quadrant of left female breast: Secondary | ICD-10-CM

## 2016-02-10 LAB — SURGICAL PCR SCREEN
MRSA, PCR: NEGATIVE
STAPHYLOCOCCUS AUREUS: POSITIVE — AB

## 2016-02-10 LAB — BASIC METABOLIC PANEL
ANION GAP: 10 (ref 5–15)
BUN: 9 mg/dL (ref 6–20)
CHLORIDE: 105 mmol/L (ref 101–111)
CO2: 27 mmol/L (ref 22–32)
Calcium: 8.9 mg/dL (ref 8.9–10.3)
Creatinine, Ser: 0.61 mg/dL (ref 0.44–1.00)
GFR calc non Af Amer: >60 mL/min (ref >60)
Glucose, Bld: 188 mg/dL — ABNORMAL HIGH (ref 65–99)
Potassium: 3.5 mmol/L (ref 3.5–5.1)
Sodium: 142 mmol/L (ref 135–145)

## 2016-02-10 LAB — CBC
HEMATOCRIT: 40 % (ref 36.0–46.0)
HEMOGLOBIN: 12.5 g/dL (ref 12.0–15.0)
MCH: 28.3 pg (ref 26.0–34.0)
MCHC: 31.3 g/dL (ref 30.0–36.0)
MCV: 90.5 fL (ref 78.0–100.0)
Platelets: 252 10*3/uL (ref 150–400)
RBC: 4.42 MIL/uL (ref 3.87–5.11)
RDW: 13.6 % (ref 11.5–15.5)
WBC: 5.3 10*3/uL (ref 4.0–10.5)

## 2016-02-10 LAB — GLUCOSE, CAPILLARY: Glucose-Capillary: 190 mg/dL — ABNORMAL HIGH (ref 65–99)

## 2016-02-10 NOTE — Pre-Procedure Instructions (Signed)
Morgan Taylor  02/10/2016      [id number] - [place], [place] - [address] AT [place] [address] Phone: [phone] Fax: [phone]    Your procedure is scheduled on Friday  02/19/16  Report to [facility] Admitting at  1020A.M.  Call this number if you have problems the morning of surgery:  [phone]   Remember:  Do not eat food or drink liquids after midnight.  Take these medicines the morning of surgery with A SIP OF WATER  AMLODIPINE (NORVASC), LEVOTHYROXINE, RITALIN, OMEPRAZOLE (PRILOSEC), OXYCODONE IF NEEDED       (STOP ANY ASPIRIN PRODUCTS, IBUPROFEN/ ADVIL/ MOTRIN, ALEVE, VITAMINS, HERBAL MEDICINES) How to Manage Your Diabetes Before Surgery   Why is it important to control my blood sugar before and after surgery?   Improving blood sugar levels before and after surgery helps healing and can limit problems.  A way of improving blood sugar control is eating a healthy diet by:  - Eating less sugar and carbohydrates  - Increasing activity/exercise  - Talk with your doctor about reaching your blood sugar goals  High blood sugars (greater than 180 mg/dL) can raise your risk of infections and slow down your recovery so you will need to focus on controlling your diabetes during the weeks before surgery.  Make sure that the doctor who takes care of your diabetes knows about your planned surgery including the date and location.  How do I manage my blood sugars before surgery?   Check your blood sugar at least 4 times a day, 2 days before surgery to make sure that they are not too high or low.   Check your blood sugar the morning of your surgery when you wake up and every 2               hours until you get to the Short-Stay unit.  If your blood sugar is less than 70 mg/dL, you will need to treat for low blood sugar by:  Treat a low blood sugar (less than 70 mg/dL) with 1/2 cup of  clear juice (cranberry or apple), 4 glucose tablets, OR glucose gel.  Recheck blood sugar in 15 minutes after treatment (to make sure it is greater than 70 mg/dL).  If blood sugar is not greater than 70 mg/dL on re-check, call [phone] for further instructions.   Report your blood sugar to the Short-Stay nurse when you get to Short-Stay.  References:  [facility], 2007 "How to Manage your Diabetes Before and After Surgery".  What do I do about my diabetes medications?   Do not take oral diabetes medicines (pills) the morning of surgery.  THE NIGHT BEFORE SURGERY, take  24 units of  lantus  Insulin.    THE MORNING OF SURGERY, take no insulin     Do not take other diabetes injectables the day of surgery including Byetta, Victoza, Bydureon, and Trulicity.    If your CBG is greater than 220 mg/dL, you may take 1/2 of your sliding scale (correction) dose of insulin.   For patients with "Insulin Pumps":  Contact your diabetes doctor for specific instructions before surgery.   Decrease basal insulin rates by 20% at midnight the night before surgery.  Note that if your surgery is planned to be longer than 2 hours, your insulin pump will be removed and intravenous (IV) insulin will be  started and managed by the nurses and anesthesiologist.  You will be able to restart your insulin pump once you are awake and able to manage it.  Make sure to bring insulin pump supplies to the hospital with you in case your site needs to be changed.        Do not wear jewelry, make-up or nail polish.  Do not wear lotions, powders, or perfumes.  You may wear deodorant.  Do not shave 48 hours prior to surgery.  Men may shave face and neck.  Do not bring valuables to the hospital.  [facility] is not responsible for any belongings or valuables.  Contacts, dentures or bridgework may not be worn into surgery.  Leave your suitcase in the car.  After surgery it may be  brought to your room.  For patients admitted to the hospital, discharge time will be determined by your treatment team.  Patients discharged the day of surgery will not be allowed to drive home.   Name and phone number of your driver:   Special instructions:  [place] - Preparing for Surgery  Before surgery, you can play an important role.  Because skin is not sterile, your skin needs to be as free of germs as possible.  You can reduce the number of germs on you skin by washing with CHG (chlorahexidine gluconate) soap before surgery.  CHG is an antiseptic cleaner which kills germs and bonds with the skin to continue killing germs even after washing.  Please DO NOT use if you have an allergy to CHG or antibacterial soaps.  If your skin becomes reddened/irritated stop using the CHG and inform your nurse when you arrive at Short Stay.  Do not shave (including legs and underarms) for at least 48 hours prior to the first CHG shower.  You may shave your face.  Please follow these instructions carefully:   1.  Shower with CHG Soap the night before surgery and the                                morning of Surgery.  2.  If you choose to wash your hair, wash your hair first as usual with your       normal shampoo.  3.  After you shampoo, rinse your hair and body thoroughly to remove the                      Shampoo.  4.  Use CHG as you would any other liquid soap.  You can apply chg directly       to the skin and wash gently with scrungie or a clean washcloth.  5.  Apply the CHG Soap to your body ONLY FROM THE NECK DOWN.        Do not use on open wounds or open sores.  Avoid contact with your eyes,       ears, mouth and genitals (private parts).  Wash genitals (private parts)       with your normal soap.  6.  Wash thoroughly, paying special attention to the area where your surgery        will be performed.  7.  Thoroughly rinse your body with warm water from the neck down.  8.  DO NOT shower/wash  with your normal soap after using and rinsing off       the CHG Soap.  9.  Pat yourself dry with a clean towel.            10.  Wear clean pajamas.            11.  Place clean sheets on your bed the night of your first shower and do not        sleep with pets.  Day of Surgery  Do not apply any lotions/deoderants the morning of surgery.  Please wear clean clothes to the hospital/surgery center.    Please read over the following fact sheets that you were given. Pain Booklet, Coughing and Deep Breathing, MRSA Information and Surgical Site Infection Prevention

## 2016-02-11 LAB — HEMOGLOBIN A1C
Hgb A1c MFr Bld: 7.5 % — ABNORMAL HIGH (ref 4.8–5.6)
Mean Plasma Glucose: 169 mg/dL

## 2016-02-13 ENCOUNTER — Encounter (HOSPITAL_COMMUNITY): Payer: Self-pay

## 2016-02-17 ENCOUNTER — Ambulatory Visit: Payer: Medicare Other | Attending: Hematology and Oncology | Admitting: Physical Therapy

## 2016-02-17 DIAGNOSIS — I890 Lymphedema, not elsewhere classified: Secondary | ICD-10-CM | POA: Insufficient documentation

## 2016-02-17 NOTE — Therapy (Signed)
[place], [place], [address] Phone: [phone]   Fax:  [phone]  Physical Therapy Evaluation  Patient Details  Name: Morgan Taylor MRN: [id number] Date of Birth: 05-11-1961 Referring Provider: Dr. Lindi Adie   Encounter Date: 02/17/2016      PT End of Session - 02/17/16 1320    Visit Number 1   Number of Visits 5   Date for PT Re-Evaluation 03/16/16   PT Start Time 0845   PT Stop Time 0930   PT Time Calculation (min) 45 min   Activity Tolerance Patient tolerated treatment well   Behavior During Therapy [facility] for tasks assessed/performed      Past Medical History  Diagnosis Date  . Diabetes mellitus   . Hypertension   . Arthritis   . Family history of anesthesia complication     Patients son has difficulty waking up after anesthesia  . Hypothyroidism   . Anxiety   . Depression   . Cancer [facility]) 2009    lumpectomy  . Hyperlipemia   . GERD (gastroesophageal reflux disease)   . Family history of breast cancer   . Family history of pancreatic cancer     Past Surgical History  Procedure Laterality Date  . Appendectomy    . Shoulder arthroscopy Left     cleaned out joint  . Cesarean section    . Dilation and curettage of uterus    . Breast lumpectomy Left   . Colonoscopy    . Total knee arthroplasty Right 08/15/2014    Procedure: RIGHT TOTAL KNEE ARTHROPLASTY;  Surgeon: Yvette Rack., MD;  Location: [place];  Service: Orthopedics;  Laterality: Right;  . Total knee arthroplasty Left 01/23/2015    Procedure: TOTAL KNEE ARTHROPLASTY;  Surgeon: Yvette Rack., MD;  Location: [place];  Service: Orthopedics;  Laterality: Left;    There were no vitals filed for this visit.  Visit Diagnosis:  Lymphedema - Plan: PT plan of care cert/re-cert      Subjective Assessment - 02/17/16 1328    Subjective I have some buring and swelling in my breast.    Pertinent History Breast cancer in April 2009 with  lumpectomy with 4 lymph nodes removed followed by radiation She has arthritis in both shoulder and has had  both knees replaced.  Planned left total surgery surgery on Feb 24. ( same side affected by breast cancer)   Patient Stated Goals control the lymphedema in her breast and control the tenderness    Currently in Pain? Yes   Pain Score 2    Pain Location Breast   Pain Orientation Left   Pain Descriptors / Indicators Burning;Tender   Pain Radiating Towards to hand but that may be from arthritis    Pain Onset 1 to 4 weeks ago   Pain Frequency Intermittent   Aggravating Factors  rain makes pain worse    Pain Relieving Factors heating pad   Effect of Pain on Daily Activities problems from shoulder pain            [facility] PT Assessment - 02/17/16 0001    Assessment   Medical Diagnosis breast cancer    Referring Provider Dr. Lindi Adie    Onset Date/Surgical Date 03/26/08   Hand Dominance Right   Precautions   Precautions Other (comment)   Precaution Comments previous lymph node dissection and radiation    Restrictions   Weight Bearing Restrictions No   Balance Screen  Has the patient fallen in the past 6 months Yes   How many times? 1  slipped on rocks, will not be addressed this session   Has the patient had a decrease in activity level because of a fear of falling?  No   Is the patient reluctant to leave their home because of a fear of falling?  No   Home Environment   Living Environment Private residence   Living Arrangements Children   Available Help at Discharge Available PRN/intermittently   Prior Function   Level of Independence Independent   Cognition   Overall Cognitive Status Within Functional Limits for tasks assessed   Observation/Other Assessments   Observations pt with pink areas on left breast above nipple that is not warm to touch. She has contraction of scar at infrerior portion of left breast with some puffy areas around it.  mottled scarring under both axilla  from chronic use of heating pad    Skin Integrity intact    Sensation   Light Touch Appears Intact   Other:   Other/ Comments lymphedema life impact scale: 16 indicating a 24% impairment   Posture/Postural Control   Posture/Postural Control Postural limitations   Postural Limitations Rounded Shoulders;Forward head   AROM   Right Shoulder Flexion 90 Degrees   Right Shoulder ABduction 64 Degrees   Left Shoulder Flexion 115 Degrees   Left Shoulder ABduction 74 Degrees   Strength   Overall Strength Comments appears to have generalized muscle atrophy    Palpation   Palpation comment lymphostatic fibrosis at inferior aspect of left breast with decreased scar mobility  The axillay scar is soft and moveable.    Transfers   Transfers Independent with all Transfers   Ambulation/Gait   Ambulation/Gait No           LYMPHEDEMA/ONCOLOGY QUESTIONNAIRE - 02/17/16 0917    Right Upper Extremity Lymphedema   15 cm Proximal to Olecranon Process 36 cm   10 cm Proximal to Olecranon Process 33.5 cm   Olecranon Process 29 cm   15 cm Proximal to Ulnar Styloid Process 29 cm   10 cm Proximal to Ulnar Styloid Process 26.7 cm   Just Proximal to Ulnar Styloid Process 17 cm   Across Hand at [employer] 19 cm   At [place] of 2nd Digit 6 cm   Left Upper Extremity Lymphedema   15 cm Proximal to Olecranon Process 36.5 cm   10 cm Proximal to Olecranon Process 34 cm   Olecranon Process 29 cm   15 cm Proximal to Ulnar Styloid Process 29 cm   10 cm Proximal to Ulnar Styloid Process 26.5 cm   Just Proximal to Ulnar Styloid Process 16.6 cm   Across Hand at [employer] 18.5 cm   At [place] of 2nd Digit 6 cm                [facility] Adult PT Treatment/Exercise - 02/17/16 0001    Self-Care   Self-Care Other Self-Care Comments   Other Self-Care Comments  provided golds gym band with velcro extender for patient to wear for brease compression as she may not be able to wear a compression bra after surgery  due to left shoulder immobilization also provided a chip pack for patinet to wear at inferior breast around scar inside bra.  He current bra fit loosly and does not provide much compression encouraged patient to go to [employer] for a compression bra  PT Education - 03-02-2016 1319    Education provided Yes   Education Details use band around breast to provide some compression for symptomatic relief from breast pain do not allow extra swelling to accumulate above band                 Long Term Clinic Goals - March 02, 2016 1338    CC Long Term Goal  #1   Title Pt will be independent in self manua lymph drainage and use of compressinon for self management of left breast lymphedema    Time 4   Period Weeks   Status New            Plan - 03-02-16 1321    Clinical Impression Statement Ms. Melka has lymphedema in left breast especially at inferior portion around lumpectomy scar. She is preparing for a left total shoulder replacement in 2 days and anticipates she may have increased swelling in left upper quadrant following surgery  She was given temporary options for compression and would benefit from instruction in self manual lymph drainage prior to surgery.  She may benefit from manual lymph drainage for post op edema control  before she starts her post total shoulder rehab.   Rehab Potential Good   Clinical Impairments Affecting Rehab Potential previous radiaion.  arthritis in both shoulders    PT Frequency --  see one time  before surgery, then if ok'd by Dr. Mardelle Matte post op    PT Duration 4 weeks  left open in case ok'd to return post op    PT Treatment/Interventions ADLs/Self Care Home Management;Patient/family education;Manual lymph drainage;Compression bandaging;Taping   PT Next Visit Plan instruct in manual lymph draiange to left breast    Consulted and Agree with Plan of Care Patient          G-Codes - Mar 02, 2016 1339    Functional Assessment  Tool Used Lymphedema Life Impact Scale score of 16 or 24% impaired    Functional Limitation Self care   Self Care Current Status [id number]) At least 20 percent but less than 40 percent impaired, limited or restricted   Self Care Goal Status [id number]) At least 1 percent but less than 20 percent impaired, limited or restricted       Problem List Patient Active Problem List   Diagnosis Date Noted  . Genetic testing 12/11/2015  . Family history of breast cancer   . Family history of pancreatic cancer   . primary Osteoarthritis of left knee 01/23/2015  . Osteoarthritis of right knee 08/15/2014  . 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 SNORING 05/03/2007  . HYPERGLYCEMIA 05/03/2007  . PHARYNGITIS, ACUTE 02/22/2007  . HYPERPLASIA, ENDOMETRIAL NOS 02/22/2007  . DYSMENORRHEA 10/18/2006  . DEGENERATIVE JOINT DISEASE 10/18/2006  . INSOMNIA 10/18/2006  . CHEST PAIN 10/18/2006  . OBESITY 10/05/2006  . ANXIETY 10/05/2006  . HYPERTENSION 10/05/2006  . GASTROENTERITIS 10/05/2006  . VAGINAL BLEEDING 10/05/2006  . MENORRHAGIA, PERIMENOPAUSAL 10/05/2006  . PATELLO-FEMORAL SYNDROME 10/05/2006   Donato Heinz. Owens Shark, PT  March 02, 2016, 1:42 PM  [place], [place], [address] Phone: [phone]   Fax:  [phone]  Name: CAPTOLIA LANGSTAFF MRN: [id number] Date of Birth: October 12, 1961

## 2016-02-18 ENCOUNTER — Ambulatory Visit: Payer: Medicare Other

## 2016-02-18 DIAGNOSIS — I890 Lymphedema, not elsewhere classified: Secondary | ICD-10-CM

## 2016-02-18 MED ORDER — CEFAZOLIN SODIUM-DEXTROSE 2-3 GM-% IV SOLR
2.0000 g | INTRAVENOUS | Status: AC
  Administered 2016-02-19: 2 g via INTRAVENOUS
  Filled 2016-02-18: qty 50

## 2016-02-18 NOTE — Therapy (Signed)
[place], [place], [address] Phone: [phone]   Fax:  [phone]  Physical Therapy Treatment  Patient Details  Name: Morgan Taylor MRN: [id number] Date of Birth: [birth year] Referring Provider: Dr. Lindi Adie   Encounter Date: 02/18/2016      PT End of Session - 02/18/16 0959    Visit Number 2   Number of Visits 5   Date for PT Re-Evaluation 03/16/16   PT Start Time 0900  Pt arrived to appt late due to transportation   PT Stop Time 0938   PT Time Calculation (min) 38 min   Activity Tolerance Patient tolerated treatment well   Behavior During Therapy [facility] for tasks assessed/performed      Past Medical History  Diagnosis Date  . Diabetes mellitus   . Hypertension   . Arthritis   . Family history of anesthesia complication     Patients son has difficulty waking up after anesthesia  . Hypothyroidism   . Anxiety   . Depression   . Cancer [facility]) 2009    lumpectomy  . Hyperlipemia   . GERD (gastroesophageal reflux disease)   . Family history of breast cancer   . Family history of pancreatic cancer     Past Surgical History  Procedure Laterality Date  . Appendectomy    . Shoulder arthroscopy Left     cleaned out joint  . Cesarean section    . Dilation and curettage of uterus    . Breast lumpectomy Left   . Colonoscopy    . Total knee arthroplasty Right 08/15/2014    Procedure: RIGHT TOTAL KNEE ARTHROPLASTY;  Surgeon: Yvette Rack., MD;  Location: [place];  Service: Orthopedics;  Laterality: Right;  . Total knee arthroplasty Left 01/23/2015    Procedure: TOTAL KNEE ARTHROPLASTY;  Surgeon: Yvette Rack., MD;  Location: [place];  Service: Orthopedics;  Laterality: Left;    There were no vitals filed for this visit.  Visit Diagnosis:  Lymphedema      Subjective Assessment - 02/18/16 1049    Subjective I slept in that binder she gave me yesterday and that really helped my swelling. It feels  softer today and the burning is better.    Pertinent History Breast cancer in April 2009 with lumpectomy with 4 lymph nodes removed followed by radiation She has arthritis in both shoulder and has had  both knees replaced.  Planned left total surgery surgery on Feb 24. ( same side affected by breast cancer)   Patient Stated Goals control the lymphedema in her breast and control the tenderness    Currently in Pain? No/denies               LYMPHEDEMA/ONCOLOGY QUESTIONNAIRE - 02/17/16 0917    Right Upper Extremity Lymphedema   15 cm Proximal to Olecranon Process 36 cm   10 cm Proximal to Olecranon Process 33.5 cm   Olecranon Process 29 cm   15 cm Proximal to Ulnar Styloid Process 29 cm   10 cm Proximal to Ulnar Styloid Process 26.7 cm   Just Proximal to Ulnar Styloid Process 17 cm   Across Hand at [employer] 19 cm   At [place] of 2nd Digit 6 cm   Left Upper Extremity Lymphedema   15 cm Proximal to Olecranon Process 36.5 cm   10 cm Proximal to Olecranon Process 34 cm   Olecranon Process 29 cm   15 cm Proximal to Ulnar Styloid  Process 29 cm   10 cm Proximal to Ulnar Styloid Process 26.5 cm   Just Proximal to Ulnar Styloid Process 16.6 cm   Across Hand at [employer] 18.5 cm   At [place] of 2nd Digit 6 cm                  [facility] Adult PT Treatment/Exercise - 02/18/16 0001    Manual Therapy   Manual Lymphatic Drainage (MLD) In Supine instructing pt throughout session today and having her perform intermittently: Short neck, 5 diaphragmatic breaths with overpressure at umbilicus by pt; Lt inguinal nodes, Lt axillo-inguinal anastomosis, then Rt axillary nodes and anterior inter-axillary anastomosis (also instructed in posterior in sitting at end of session for her daughter to possibly perform after surgery for pt), and then Lt breast redirecting along anastomosis and focusing on inferior breast at and around incision.                PT Education - 02/18/16 [phone]     Education provided Yes   Education Details Self manual lymph drainage to Lt breast   Person(s) Educated Patient   Methods Explanation;Demonstration;Handout;Tactile cues;Verbal cues   Comprehension Verbalized understanding;Returned demonstration;Verbal cues required;Tactile cues required;Need further instruction                [facility] Goals - 01-Mar-2016 1338    CC Long Term Goal  #1   Title Pt will be independent in self manua lymph drainage and use of compressinon for self management of left breast lymphedema    Time 4   Period Weeks   Status New            Plan - 02/18/16 1001    Clinical Impression Statement Pt did very well with instruction of self manual lymph drainage today and returned good demonstration. She asked good, appropriate questions and also did well with understanding how to perform after her shoulder surgery tomorrow. Her daughter will be with her at home so instructed pt also that daughter can do posterior inter-axillary anastomosis . Pt verbalized good understanding of all. Pt also to bring [employer] gym trunk binder to hospital to see if she can wear it after surgery as this really helped decresae her swelling from yesterday per her report.   Rehab Potential Good   Clinical Impairments Affecting Rehab Potential previous radiaion.  arthritis in both shoulders    PT Duration 4 weeks  left open in case okayed to return post op   PT Treatment/Interventions ADLs/Self Care Home Management;Patient/family education;Manual lymph drainage;Compression bandaging;Taping   PT Next Visit Plan Cont and review manual lymph drainage to left breast; instruct daughter if she is able to come    PT Home Exercise Plan see education section   Consulted and Agree with Plan of Care Patient          G-Codes - 2016/03/01 1339    Functional Assessment Tool Used Lymphedema Life Impact Scale score of 16 or 24% impaired    Functional Limitation Self care   Self Care Current  Status [id number]) At least 20 percent but less than 40 percent impaired, limited or restricted   Self Care Goal Status [id number]) At least 1 percent but less than 20 percent impaired, limited or restricted      Problem List Patient Active Problem List   Diagnosis Date Noted  . Genetic testing 12/11/2015  . Family history of breast cancer   . Family history of pancreatic cancer   .  primary Osteoarthritis of left knee 01/23/2015  . Osteoarthritis of right knee 08/15/2014  . 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 SNORING 05/03/2007  . HYPERGLYCEMIA 05/03/2007  . PHARYNGITIS, ACUTE 02/22/2007  . HYPERPLASIA, ENDOMETRIAL NOS 02/22/2007  . DYSMENORRHEA 10/18/2006  . DEGENERATIVE JOINT DISEASE 10/18/2006  . INSOMNIA 10/18/2006  . CHEST PAIN 10/18/2006  . OBESITY 10/05/2006  . ANXIETY 10/05/2006  . HYPERTENSION 10/05/2006  . GASTROENTERITIS 10/05/2006  . VAGINAL BLEEDING 10/05/2006  . MENORRHAGIA, PERIMENOPAUSAL 10/05/2006  . PATELLO-FEMORAL SYNDROME 10/05/2006    Otelia Limes, PTA 02/18/2016, 10:55 AM  [place], [place], [address] Phone: [phone]   Fax:  [phone]  Name: Morgan Taylor MRN: [id number] Date of Birth: 02/22/1961

## 2016-02-18 NOTE — Patient Instructions (Signed)
Self manual lymph drainage:   [facility] [id number]  Circles above collar bone 5 reps  Perform this sequence once a day.  Only give enough pressure to make your skin stretch, don't slide on the skin.  Diaphragmatic - Supine   Inhale through nose making navel move out toward hands. Exhale through puckered lips, hands follow navel in. Repeat _5__ times. Rest _10__ seconds between repeats.   Copyright  VHI. All rights reserved.  Hug yourself.  Do circles at your neck just above your collarbones.  Repeat this 10 times.  Axilla - RIGHT SIDE ONLY   Using full weight of flat hand and fingers at center of uninvolved armpit, make _10__ in-place circles. Then "build highway" across chest, can also do this across the back after surgery with daughters help right below the should blade line.  Copyright  VHI. All rights reserved.  LEG: Inguinal Nodes Stimulation   With small finger side of hand against hip crease on involved side, gently perform circles at the crease. Repeat __10_ times.   Copyright  VHI. All rights reserved.  1) Axilla to Inguinal Nodes - Sweep   On involved side, sweep _4__ times from armpit along side of trunk to hip crease.  Now gently stretch skin from the involved side to the uninvolved side across the chest at the shoulder line.  Repeat that 4 times.  Draw an imaginary diagonal line from upper outer breast through the nipple area toward lower inner breast.  Direct fluid upward and inward from this line toward the pathway across your upper chest .  Do this in three rows to treat all of the upper inner breast tissue, and do each row 3-4x.      Direct fluid to treat all of lower outer breast tissue downward and outward toward      pathway that is aimed at the left groin.  Finish by doing the pathways as described above going from your involved armpit to the same side groin and going across your upper chest from the involved shoulder to the uninvolved  shoulder.  Repeat the steps above where you do circles in your left groin and right armpit. Copyright  VHI. All rights reserved.

## 2016-02-19 ENCOUNTER — Inpatient Hospital Stay (HOSPITAL_BASED_OUTPATIENT_CLINIC_OR_DEPARTMENT_OTHER)
Admission: AD | Admit: 2016-02-19 | Discharge: 2016-02-21 | DRG: 483 | Disposition: A | Discharge status: Home / Self Care | Payer: Medicare Other | Source: Ambulatory Visit | Attending: Orthopedic Surgery | Admitting: Orthopedic Surgery

## 2016-02-19 ENCOUNTER — Encounter (HOSPITAL_COMMUNITY): Payer: Self-pay | Admitting: Anesthesiology

## 2016-02-19 ENCOUNTER — Inpatient Hospital Stay (HOSPITAL_COMMUNITY): Payer: Medicare Other | Admitting: Anesthesiology

## 2016-02-19 ENCOUNTER — Encounter (HOSPITAL_COMMUNITY)
Admission: AD | Disposition: A | Discharge status: Home / Self Care | Payer: Self-pay | Source: Ambulatory Visit | Attending: Orthopedic Surgery

## 2016-02-19 ENCOUNTER — Inpatient Hospital Stay (HOSPITAL_COMMUNITY): Payer: Medicare Other

## 2016-02-19 DIAGNOSIS — I10 Essential (primary) hypertension: Secondary | ICD-10-CM | POA: Diagnosis present

## 2016-02-19 DIAGNOSIS — Z803 Family history of malignant neoplasm of breast: Secondary | ICD-10-CM

## 2016-02-19 DIAGNOSIS — M19012 Primary osteoarthritis, left shoulder: Principal | ICD-10-CM | POA: Diagnosis present

## 2016-02-19 DIAGNOSIS — E785 Hyperlipidemia, unspecified: Secondary | ICD-10-CM | POA: Diagnosis present

## 2016-02-19 DIAGNOSIS — Z79899 Other long term (current) drug therapy: Secondary | ICD-10-CM

## 2016-02-19 DIAGNOSIS — Z853 Personal history of malignant neoplasm of breast: Secondary | ICD-10-CM

## 2016-02-19 DIAGNOSIS — E039 Hypothyroidism, unspecified: Secondary | ICD-10-CM | POA: Diagnosis present

## 2016-02-19 DIAGNOSIS — Z794 Long term (current) use of insulin: Secondary | ICD-10-CM | POA: Diagnosis not present

## 2016-02-19 DIAGNOSIS — Z923 Personal history of irradiation: Secondary | ICD-10-CM | POA: Diagnosis not present

## 2016-02-19 DIAGNOSIS — Z96653 Presence of artificial knee joint, bilateral: Secondary | ICD-10-CM | POA: Diagnosis present

## 2016-02-19 DIAGNOSIS — Z87891 Personal history of nicotine dependence: Secondary | ICD-10-CM | POA: Diagnosis not present

## 2016-02-19 DIAGNOSIS — Z6836 Body mass index (BMI) 36.0-36.9, adult: Secondary | ICD-10-CM

## 2016-02-19 DIAGNOSIS — Z96612 Presence of left artificial shoulder joint: Secondary | ICD-10-CM

## 2016-02-19 DIAGNOSIS — F329 Major depressive disorder, single episode, unspecified: Secondary | ICD-10-CM | POA: Diagnosis present

## 2016-02-19 DIAGNOSIS — F419 Anxiety disorder, unspecified: Secondary | ICD-10-CM | POA: Diagnosis present

## 2016-02-19 DIAGNOSIS — M25711 Osteophyte, right shoulder: Secondary | ICD-10-CM | POA: Diagnosis present

## 2016-02-19 DIAGNOSIS — K219 Gastro-esophageal reflux disease without esophagitis: Secondary | ICD-10-CM | POA: Diagnosis present

## 2016-02-19 DIAGNOSIS — Z96619 Presence of unspecified artificial shoulder joint: Secondary | ICD-10-CM

## 2016-02-19 DIAGNOSIS — Z888 Allergy status to other drugs, medicaments and biological substances status: Secondary | ICD-10-CM

## 2016-02-19 DIAGNOSIS — E119 Type 2 diabetes mellitus without complications: Secondary | ICD-10-CM | POA: Diagnosis present

## 2016-02-19 DIAGNOSIS — Z882 Allergy status to sulfonamides status: Secondary | ICD-10-CM

## 2016-02-19 DIAGNOSIS — Z800 Family history of malignant neoplasm of digestive organs: Secondary | ICD-10-CM

## 2016-02-19 HISTORY — PX: TOTAL SHOULDER ARTHROPLASTY: SHX126

## 2016-02-19 LAB — GLUCOSE, CAPILLARY
GLUCOSE-CAPILLARY: 118 mg/dL — AB (ref 65–99)
GLUCOSE-CAPILLARY: 156 mg/dL — AB (ref 65–99)
GLUCOSE-CAPILLARY: 158 mg/dL — AB (ref 65–99)
GLUCOSE-CAPILLARY: 141 mg/dL — AB (ref 65–99)
GLUCOSE-CAPILLARY: 332 mg/dL — AB (ref 65–99)
Glucose-Capillary: 129 mg/dL — ABNORMAL HIGH (ref 65–99)

## 2016-02-19 SURGERY — ARTHROPLASTY, SHOULDER, TOTAL
Anesthesia: Regional | Site: Shoulder | Laterality: Left

## 2016-02-19 MED ORDER — LOSARTAN POTASSIUM 50 MG PO TABS
100.0000 mg | ORAL_TABLET | Freq: Every morning | ORAL | Status: DC
  Administered 2016-02-20 – 2016-02-21 (×2): 100 mg via ORAL
  Filled 2016-02-19 (×2): qty 2

## 2016-02-19 MED ORDER — LIDOCAINE HCL (CARDIAC) 20 MG/ML IV SOLN
INTRAVENOUS | Status: DC | PRN
  Administered 2016-02-19: 60 mg via INTRAVENOUS

## 2016-02-19 MED ORDER — MIDAZOLAM HCL 2 MG/2ML IJ SOLN
INTRAMUSCULAR | Status: AC
  Administered 2016-02-19: 2 mg via INTRAVENOUS
  Filled 2016-02-19: qty 2

## 2016-02-19 MED ORDER — ONDANSETRON HCL 4 MG/2ML IJ SOLN
INTRAMUSCULAR | Status: DC | PRN
  Administered 2016-02-19: 4 mg via INTRAVENOUS

## 2016-02-19 MED ORDER — LACTATED RINGERS IV SOLN
INTRAVENOUS | Status: DC
  Administered 2016-02-19 (×2): via INTRAVENOUS

## 2016-02-19 MED ORDER — SODIUM CHLORIDE 0.9 % IR SOLN
Status: DC | PRN
  Administered 2016-02-19: 1000 mL

## 2016-02-19 MED ORDER — ALUM & MAG HYDROXIDE-SIMETH 200-200-20 MG/5ML PO SUSP: 30.0000 mL | ORAL | Status: DC | PRN

## 2016-02-19 MED ORDER — MIRTAZAPINE 15 MG PO TABS
15.0000 mg | ORAL_TABLET | Freq: Every day | ORAL | Status: DC
  Administered 2016-02-19 – 2016-02-20 (×3): 15 mg via ORAL
  Filled 2016-02-19 (×3): qty 1

## 2016-02-19 MED ORDER — VANCOMYCIN HCL IN DEXTROSE 1-5 GM/200ML-% IV SOLN
1000.0000 mg | Freq: Two times a day (BID) | INTRAVENOUS | Status: AC
  Administered 2016-02-19: 1000 mg via INTRAVENOUS
  Filled 2016-02-19: qty 200

## 2016-02-19 MED ORDER — OXYCODONE HCL 5 MG PO TABS
5.0000 mg | ORAL_TABLET | ORAL | Status: DC | PRN
  Administered 2016-02-19 – 2016-02-21 (×9): 10 mg via ORAL
  Filled 2016-02-19 (×9): qty 2

## 2016-02-19 MED ORDER — LIRAGLUTIDE 18 MG/3ML ~~LOC~~ SOPN: 1.8000 mg | PEN_INJECTOR | Freq: Every day | SUBCUTANEOUS | Status: DC

## 2016-02-19 MED ORDER — VANCOMYCIN HCL 1000 MG IV SOLR
1000.0000 mg | INTRAVENOUS | Status: DC | PRN
  Administered 2016-02-19: 1000 mg via INTRAVENOUS

## 2016-02-19 MED ORDER — METHOCARBAMOL 500 MG PO TABS
ORAL_TABLET | ORAL | Status: AC
  Filled 2016-02-19: qty 1

## 2016-02-19 MED ORDER — ATORVASTATIN CALCIUM 20 MG PO TABS
20.0000 mg | ORAL_TABLET | Freq: Every morning | ORAL | Status: DC
  Administered 2016-02-20 – 2016-02-21 (×2): 20 mg via ORAL
  Filled 2016-02-19 (×2): qty 1

## 2016-02-19 MED ORDER — MENTHOL 3 MG MT LOZG: 1.0000 | LOZENGE | OROMUCOSAL | Status: DC | PRN

## 2016-02-19 MED ORDER — PIOGLITAZONE HCL 15 MG PO TABS
15.0000 mg | ORAL_TABLET | Freq: Every morning | ORAL | Status: DC
  Administered 2016-02-20 – 2016-02-21 (×2): 15 mg via ORAL
  Filled 2016-02-19 (×2): qty 1

## 2016-02-19 MED ORDER — HYDROMORPHONE HCL 1 MG/ML IJ SOLN
INTRAMUSCULAR | Status: AC
  Administered 2016-02-19: 0.5 mg via INTRAVENOUS
  Filled 2016-02-19: qty 1

## 2016-02-19 MED ORDER — PANTOPRAZOLE SODIUM 40 MG PO TBEC
80.0000 mg | DELAYED_RELEASE_TABLET | Freq: Every day | ORAL | Status: DC
  Administered 2016-02-19 – 2016-02-21 (×3): 80 mg via ORAL
  Filled 2016-02-19 (×3): qty 2

## 2016-02-19 MED ORDER — FENTANYL CITRATE (PF) 250 MCG/5ML IJ SOLN
INTRAMUSCULAR | Status: AC
  Filled 2016-02-19: qty 5

## 2016-02-19 MED ORDER — ACETAMINOPHEN 325 MG PO TABS
ORAL_TABLET | ORAL | Status: AC
  Filled 2016-02-19: qty 2

## 2016-02-19 MED ORDER — ONDANSETRON HCL 4 MG PO TABS: 4.0000 mg | ORAL_TABLET | Freq: Four times a day (QID) | ORAL | Status: DC | PRN

## 2016-02-19 MED ORDER — BACLOFEN 10 MG PO TABS: 10.0000 mg | ORAL_TABLET | Freq: Three times a day (TID) | ORAL | Status: DC

## 2016-02-19 MED ORDER — NEOSTIGMINE METHYLSULFATE 10 MG/10ML IV SOLN
INTRAVENOUS | Status: AC
  Filled 2016-02-19: qty 1

## 2016-02-19 MED ORDER — OXYCODONE HCL ER 10 MG PO T12A: 10.0000 mg | EXTENDED_RELEASE_TABLET | Freq: Two times a day (BID) | ORAL | Status: DC

## 2016-02-19 MED ORDER — ROCURONIUM BROMIDE 100 MG/10ML IV SOLN
INTRAVENOUS | Status: DC | PRN
  Administered 2016-02-19: 50 mg via INTRAVENOUS

## 2016-02-19 MED ORDER — POTASSIUM CHLORIDE IN NACL 20-0.45 MEQ/L-% IV SOLN
INTRAVENOUS | Status: DC
  Administered 2016-02-19: 75 mL via INTRAVENOUS
  Filled 2016-02-19 (×5): qty 1000

## 2016-02-19 MED ORDER — MIDAZOLAM HCL 2 MG/2ML IJ SOLN
INTRAMUSCULAR | Status: AC
  Filled 2016-02-19: qty 2

## 2016-02-19 MED ORDER — PROMETHAZINE HCL 25 MG PO TABS
25.0000 mg | ORAL_TABLET | Freq: Four times a day (QID) | ORAL | Status: DC | PRN
  Administered 2016-02-19: 25 mg via ORAL
  Filled 2016-02-19: qty 1

## 2016-02-19 MED ORDER — NEOSTIGMINE METHYLSULFATE 10 MG/10ML IV SOLN
INTRAVENOUS | Status: DC | PRN
  Administered 2016-02-19: 4 mg via INTRAVENOUS

## 2016-02-19 MED ORDER — PROMETHAZINE HCL 25 MG/ML IJ SOLN
25.0000 mg | Freq: Four times a day (QID) | INTRAMUSCULAR | Status: DC | PRN
  Administered 2016-02-19: 25 mg via INTRAVENOUS
  Filled 2016-02-19 (×2): qty 1

## 2016-02-19 MED ORDER — HYDROCODONE-ACETAMINOPHEN 10-325 MG PO TABS: 1.0000 | ORAL_TABLET | Freq: Four times a day (QID) | ORAL | Status: DC | PRN

## 2016-02-19 MED ORDER — METHOCARBAMOL 1000 MG/10ML IJ SOLN
500.0000 mg | Freq: Four times a day (QID) | INTRAMUSCULAR | Status: DC | PRN
  Filled 2016-02-19: qty 5

## 2016-02-19 MED ORDER — BUPIVACAINE HCL (PF) 0.25 % IJ SOLN
INTRAMUSCULAR | Status: AC
  Filled 2016-02-19: qty 30

## 2016-02-19 MED ORDER — PHENYLEPHRINE HCL 10 MG/ML IJ SOLN
10.0000 mg | INTRAVENOUS | Status: DC | PRN
  Administered 2016-02-19: 40 ug/min via INTRAVENOUS
  Administered 2016-02-19: 60 ug/min via INTRAVENOUS

## 2016-02-19 MED ORDER — SENNA 8.6 MG PO TABS
1.0000 | ORAL_TABLET | Freq: Two times a day (BID) | ORAL | Status: DC
  Administered 2016-02-19 – 2016-02-21 (×4): 8.6 mg via ORAL
  Filled 2016-02-19 (×4): qty 1

## 2016-02-19 MED ORDER — OXYCODONE HCL 5 MG PO TABS
ORAL_TABLET | ORAL | Status: AC
  Filled 2016-02-19: qty 2

## 2016-02-19 MED ORDER — PHENYLEPHRINE HCL 10 MG/ML IJ SOLN
INTRAMUSCULAR | Status: DC | PRN
  Administered 2016-02-19: 80 ug via INTRAVENOUS

## 2016-02-19 MED ORDER — PHENOL 1.4 % MT LIQD: 1.0000 | OROMUCOSAL | Status: DC | PRN

## 2016-02-19 MED ORDER — GLYCOPYRROLATE 0.2 MG/ML IJ SOLN
INTRAMUSCULAR | Status: DC | PRN
  Administered 2016-02-19: 0.6 mg via INTRAVENOUS

## 2016-02-19 MED ORDER — LEVOTHYROXINE SODIUM 75 MCG PO TABS
75.0000 ug | ORAL_TABLET | Freq: Every day | ORAL | Status: DC
  Administered 2016-02-20 – 2016-02-21 (×2): 75 ug via ORAL
  Filled 2016-02-19 (×2): qty 1

## 2016-02-19 MED ORDER — CEFAZOLIN SODIUM-DEXTROSE 2-3 GM-% IV SOLR
2.0000 g | Freq: Three times a day (TID) | INTRAVENOUS | Status: AC
  Administered 2016-02-19 – 2016-02-20 (×2): 2 g via INTRAVENOUS
  Filled 2016-02-19 (×2): qty 50

## 2016-02-19 MED ORDER — ONDANSETRON HCL 4 MG/2ML IJ SOLN
INTRAMUSCULAR | Status: AC
  Filled 2016-02-19: qty 2

## 2016-02-19 MED ORDER — GLYCOPYRROLATE 0.2 MG/ML IJ SOLN
INTRAMUSCULAR | Status: AC
  Filled 2016-02-19: qty 3

## 2016-02-19 MED ORDER — METOCLOPRAMIDE HCL 5 MG/ML IJ SOLN
INTRAMUSCULAR | Status: AC
  Administered 2016-02-19: 10 mg via INTRAVENOUS
  Filled 2016-02-19: qty 2

## 2016-02-19 MED ORDER — DOCUSATE SODIUM 100 MG PO CAPS
100.0000 mg | ORAL_CAPSULE | Freq: Two times a day (BID) | ORAL | Status: DC
  Administered 2016-02-19 – 2016-02-21 (×4): 100 mg via ORAL
  Filled 2016-02-19 (×4): qty 1

## 2016-02-19 MED ORDER — 0.9 % SODIUM CHLORIDE (POUR BTL) OPTIME
TOPICAL | Status: DC | PRN
  Administered 2016-02-19: 1000 mL

## 2016-02-19 MED ORDER — ACETAMINOPHEN 325 MG PO TABS
650.0000 mg | ORAL_TABLET | Freq: Four times a day (QID) | ORAL | Status: DC | PRN
  Administered 2016-02-19 – 2016-02-20 (×2): 650 mg via ORAL
  Filled 2016-02-19 (×2): qty 2

## 2016-02-19 MED ORDER — OXYCODONE HCL ER 10 MG PO T12A
10.0000 mg | EXTENDED_RELEASE_TABLET | Freq: Two times a day (BID) | ORAL | Status: DC
  Administered 2016-02-19 – 2016-02-21 (×4): 10 mg via ORAL
  Filled 2016-02-19 (×4): qty 1

## 2016-02-19 MED ORDER — HYDROMORPHONE HCL 1 MG/ML IJ SOLN
1.0000 mg | INTRAMUSCULAR | Status: DC | PRN
  Administered 2016-02-19 – 2016-02-20 (×5): 1 mg via INTRAVENOUS
  Filled 2016-02-19 (×5): qty 1

## 2016-02-19 MED ORDER — SENNA-DOCUSATE SODIUM 8.6-50 MG PO TABS: 2.0000 | ORAL_TABLET | Freq: Every day | ORAL | Status: DC

## 2016-02-19 MED ORDER — METHOCARBAMOL 500 MG PO TABS
500.0000 mg | ORAL_TABLET | Freq: Four times a day (QID) | ORAL | Status: DC | PRN
  Administered 2016-02-19 – 2016-02-21 (×5): 500 mg via ORAL
  Filled 2016-02-19 (×4): qty 1

## 2016-02-19 MED ORDER — PHENYLEPHRINE 40 MCG/ML (10ML) SYRINGE FOR IV PUSH (FOR BLOOD PRESSURE SUPPORT)
PREFILLED_SYRINGE | INTRAVENOUS | Status: AC
  Filled 2016-02-19: qty 10

## 2016-02-19 MED ORDER — PROPOFOL 10 MG/ML IV BOLUS
INTRAVENOUS | Status: DC | PRN
  Administered 2016-02-19: 150 mg via INTRAVENOUS
  Administered 2016-02-19: 50 mg via INTRAVENOUS

## 2016-02-19 MED ORDER — BUPIVACAINE HCL (PF) 0.25 % IJ SOLN
INTRAMUSCULAR | Status: DC | PRN
  Administered 2016-02-19: 10 mL

## 2016-02-19 MED ORDER — METOCLOPRAMIDE HCL 5 MG/ML IJ SOLN
5.0000 mg | Freq: Three times a day (TID) | INTRAMUSCULAR | Status: DC | PRN
  Administered 2016-02-19: 10 mg via INTRAVENOUS

## 2016-02-19 MED ORDER — ONDANSETRON HCL 4 MG PO TABS: 4.0000 mg | ORAL_TABLET | Freq: Three times a day (TID) | ORAL | Status: DC | PRN

## 2016-02-19 MED ORDER — MIDAZOLAM HCL 5 MG/ML IJ SOLN: 2.0000 mg | Freq: Once | INTRAMUSCULAR | Status: DC

## 2016-02-19 MED ORDER — ACETAMINOPHEN 650 MG RE SUPP: 650.0000 mg | Freq: Four times a day (QID) | RECTAL | Status: DC | PRN

## 2016-02-19 MED ORDER — ONDANSETRON HCL 4 MG/2ML IJ SOLN: 4.0000 mg | Freq: Four times a day (QID) | INTRAMUSCULAR | Status: DC | PRN

## 2016-02-19 MED ORDER — ROCURONIUM BROMIDE 50 MG/5ML IV SOLN
INTRAVENOUS | Status: AC
  Filled 2016-02-19: qty 1

## 2016-02-19 MED ORDER — MAGNESIUM CITRATE PO SOLN: 1.0000 | Freq: Once | ORAL | Status: DC | PRN

## 2016-02-19 MED ORDER — FENTANYL CITRATE (PF) 100 MCG/2ML IJ SOLN
INTRAMUSCULAR | Status: DC | PRN
  Administered 2016-02-19: 100 ug via INTRAVENOUS
  Administered 2016-02-19: 50 ug via INTRAVENOUS

## 2016-02-19 MED ORDER — VANCOMYCIN HCL IN DEXTROSE 1-5 GM/200ML-% IV SOLN
INTRAVENOUS | Status: AC
  Filled 2016-02-19: qty 200

## 2016-02-19 MED ORDER — LIDOCAINE HCL (CARDIAC) 20 MG/ML IV SOLN
INTRAVENOUS | Status: AC
  Filled 2016-02-19: qty 5

## 2016-02-19 MED ORDER — AMLODIPINE BESYLATE 10 MG PO TABS
10.0000 mg | ORAL_TABLET | Freq: Every morning | ORAL | Status: DC
  Administered 2016-02-20 – 2016-02-21 (×2): 10 mg via ORAL
  Filled 2016-02-19 (×2): qty 1

## 2016-02-19 MED ORDER — HYDROMORPHONE HCL 1 MG/ML IJ SOLN
0.2500 mg | INTRAMUSCULAR | Status: DC | PRN
  Administered 2016-02-19 (×4): 0.5 mg via INTRAVENOUS

## 2016-02-19 MED ORDER — BUPIVACAINE-EPINEPHRINE (PF) 0.5% -1:200000 IJ SOLN
INTRAMUSCULAR | Status: DC | PRN
  Administered 2016-02-19: 30 mL via PERINEURAL

## 2016-02-19 MED ORDER — POLYETHYLENE GLYCOL 3350 17 G PO PACK: 17.0000 g | PACK | Freq: Every day | ORAL | Status: DC | PRN

## 2016-02-19 MED ORDER — FENTANYL CITRATE (PF) 100 MCG/2ML IJ SOLN
INTRAMUSCULAR | Status: AC
  Filled 2016-02-19: qty 2

## 2016-02-19 MED ORDER — INSULIN GLARGINE 100 UNIT/ML ~~LOC~~ SOLN
30.0000 [IU] | Freq: Every day | SUBCUTANEOUS | Status: DC
  Administered 2016-02-19 – 2016-02-20 (×2): 30 [IU] via SUBCUTANEOUS
  Filled 2016-02-19 (×3): qty 0.3

## 2016-02-19 MED ORDER — METHYLPHENIDATE HCL 5 MG PO TABS
20.0000 mg | ORAL_TABLET | ORAL | Status: DC
  Administered 2016-02-20 – 2016-02-21 (×3): 20 mg via ORAL
  Filled 2016-02-19 (×3): qty 4

## 2016-02-19 MED ORDER — BISACODYL 10 MG RE SUPP: 10.0000 mg | Freq: Every day | RECTAL | Status: DC | PRN

## 2016-02-19 MED ORDER — METOCLOPRAMIDE HCL 5 MG PO TABS: 5.0000 mg | ORAL_TABLET | Freq: Three times a day (TID) | ORAL | Status: DC | PRN

## 2016-02-19 MED ORDER — FENTANYL CITRATE (PF) 100 MCG/2ML IJ SOLN
100.0000 ug | Freq: Once | INTRAMUSCULAR | Status: AC
  Administered 2016-02-19: 100 ug via INTRAVENOUS
  Filled 2016-02-19: qty 2

## 2016-02-19 MED ORDER — DIPHENHYDRAMINE HCL 12.5 MG/5ML PO ELIX
12.5000 mg | ORAL_SOLUTION | ORAL | Status: DC | PRN
  Administered 2016-02-19 – 2016-02-21 (×6): 25 mg via ORAL
  Filled 2016-02-19 (×9): qty 10

## 2016-02-19 SURGICAL SUPPLY — 72 items
BIT DRILL 5/64X5 DISP (BIT) ×3 IMPLANT
BLADE SAW SGTL MED 73X18.5 STR (BLADE) ×3 IMPLANT
BRUSH FEMORAL CANAL (MISCELLANEOUS) IMPLANT
CAPT SHLDR TOTAL 2 ×2 IMPLANT
CEMENT BONE DEPUY (Cement) ×3 IMPLANT
CLOSURE STERI-STRIP 1/2X4 (GAUZE/BANDAGES/DRESSINGS) ×1
CLSR STERI-STRIP ANTIMIC 1/2X4 (GAUZE/BANDAGES/DRESSINGS) ×2 IMPLANT
COVER SURGICAL LIGHT HANDLE (MISCELLANEOUS) ×3 IMPLANT
COVER TABLE BACK 60X90 (DRAPES) IMPLANT
DRAPE ORTHO SPLIT 77X108 STRL (DRAPES) ×6
DRAPE PROXIMA HALF (DRAPES) ×3 IMPLANT
DRAPE SURG 17X23 STRL (DRAPES) ×2 IMPLANT
DRAPE SURG ORHT 6 SPLT 77X108 (DRAPES) ×2 IMPLANT
DRAPE U-SHAPE 47X51 STRL (DRAPES) ×3 IMPLANT
DRSG MEPILEX BORDER 4X8 (GAUZE/BANDAGES/DRESSINGS) ×3 IMPLANT
DURAPREP 26ML APPLICATOR (WOUND CARE) ×3 IMPLANT
ELECT CAUTERY BLADE 6.4 (BLADE) ×2 IMPLANT
ELECT REM PT RETURN 9FT ADLT (ELECTROSURGICAL) ×3
ELECTRODE REM PT RTRN 9FT ADLT (ELECTROSURGICAL) ×1 IMPLANT
EVACUATOR 1/8 PVC DRAIN (DRAIN) IMPLANT
FACESHIELD WRAPAROUND (MASK) ×3 IMPLANT
FACESHIELD WRAPAROUND OR TEAM (MASK) ×1 IMPLANT
GLOVE BIOGEL PI IND STRL 7.0 (GLOVE) IMPLANT
GLOVE BIOGEL PI IND STRL 7.5 (GLOVE) IMPLANT
GLOVE BIOGEL PI IND STRL 8 (GLOVE) ×1 IMPLANT
GLOVE BIOGEL PI INDICATOR 7.0 (GLOVE) ×2
GLOVE BIOGEL PI INDICATOR 7.5 (GLOVE) ×2
GLOVE BIOGEL PI INDICATOR 8 (GLOVE) ×2
GLOVE BIOGEL PI ORTHO PRO SZ8 (GLOVE) ×2
GLOVE ORTHO TXT STRL SZ7.5 (GLOVE) ×3 IMPLANT
GLOVE PI ORTHO PRO STRL SZ8 (GLOVE) ×1 IMPLANT
GLOVE SURG ORTHO 8.0 STRL STRW (GLOVE) ×6 IMPLANT
GLOVE SURG SS PI 6.5 STRL IVOR (GLOVE) ×6 IMPLANT
GOWN STRL REUS W/ TWL LRG LVL3 (GOWN DISPOSABLE) ×1 IMPLANT
GOWN STRL REUS W/ TWL XL LVL3 (GOWN DISPOSABLE) ×1 IMPLANT
GOWN STRL REUS W/TWL 2XL LVL3 (GOWN DISPOSABLE) ×3 IMPLANT
GOWN STRL REUS W/TWL LRG LVL3 (GOWN DISPOSABLE) ×6
GOWN STRL REUS W/TWL XL LVL3 (GOWN DISPOSABLE) ×3
HANDPIECE INTERPULSE COAX TIP (DISPOSABLE) ×3
HOOD PEEL AWAY FACE SHEILD DIS (HOOD) ×5 IMPLANT
KIT BASIN OR (CUSTOM PROCEDURE TRAY) ×3 IMPLANT
KIT ROOM TURNOVER OR (KITS) ×3 IMPLANT
MANIFOLD NEPTUNE II (INSTRUMENTS) ×3 IMPLANT
NDL 1/2 CIR CATGUT .05X1.09 (NEEDLE) IMPLANT
NDL HYPO 25GX1X1/2 BEV (NEEDLE) IMPLANT
NEEDLE 1/2 CIR CATGUT .05X1.09 (NEEDLE) ×3 IMPLANT
NEEDLE HYPO 25GX1X1/2 BEV (NEEDLE) ×3 IMPLANT
NS IRRIG 1000ML POUR BTL (IV SOLUTION) ×3 IMPLANT
PACK SHOULDER (CUSTOM PROCEDURE TRAY) ×3 IMPLANT
PAD ARMBOARD 7.5X6 YLW CONV (MISCELLANEOUS) ×6 IMPLANT
SET HNDPC FAN SPRY TIP SCT (DISPOSABLE) ×1 IMPLANT
SLING ARM IMMOBILIZER LRG (SOFTGOODS) IMPLANT
SLING ARM IMMOBILIZER MED (SOFTGOODS) ×2 IMPLANT
SMARTMIX MINI TOWER (MISCELLANEOUS) ×3
SPONGE LAP 18X18 X RAY DECT (DISPOSABLE) ×3 IMPLANT
SUCTION FRAZIER HANDLE 10FR (MISCELLANEOUS) ×2
SUCTION TUBE FRAZIER 10FR DISP (MISCELLANEOUS) ×1 IMPLANT
SUPPORT WRAP ARM LG (MISCELLANEOUS) ×3 IMPLANT
SUT FIBERWIRE #2 38 REV NDL BL (SUTURE) ×6
SUT MAXBRAID (SUTURE) ×4 IMPLANT
SUT MNCRL AB 4-0 PS2 18 (SUTURE) ×2 IMPLANT
SUT VIC AB 0 CT1 27 (SUTURE) ×3
SUT VIC AB 0 CT1 27XBRD ANBCTR (SUTURE) ×1 IMPLANT
SUT VIC AB 2-0 CT1 27 (SUTURE) ×3
SUT VIC AB 2-0 CT1 TAPERPNT 27 (SUTURE) IMPLANT
SUT VIC AB 3-0 SH 8-18 (SUTURE) ×3 IMPLANT
SUTURE FIBERWR#2 38 REV NDL BL (SUTURE) IMPLANT
SYR CONTROL 10ML LL (SYRINGE) IMPLANT
TOWEL OR 17X24 6PK STRL BLUE (TOWEL DISPOSABLE) ×3 IMPLANT
TOWEL OR 17X26 10 PK STRL BLUE (TOWEL DISPOSABLE) ×3 IMPLANT
TOWER SMARTMIX MINI (MISCELLANEOUS) ×1 IMPLANT
WATER STERILE IRR 1000ML POUR (IV SOLUTION) ×3 IMPLANT

## 2016-02-19 NOTE — Transfer of Care (Signed)
Immediate Anesthesia Transfer of Care Note  Patient: Morgan Taylor  Procedure(s) Performed: Procedure(s): LEFT TOTAL SHOULDER ARTHROPLASTY (Left)  Patient Location: PACU  Anesthesia Type:General and intrascalene block  Level of Consciousness: awake, alert , oriented and patient cooperative  Airway & Oxygen Therapy: Patient Spontanous Breathing and Patient connected to nasal cannula oxygen  Post-op Assessment: Report given to RN, Post -op Vital signs reviewed and stable and Patient moving all extremities  Post vital signs: Reviewed and stable  Last Vitals:  Filed Vitals:   02/19/16 0955 02/19/16 1320  BP: 143/90   Pulse: 80 92  Temp:  36.5 C  Resp: 11 16    Complications: No apparent anesthesia complications

## 2016-02-19 NOTE — Anesthesia Preprocedure Evaluation (Addendum)
Anesthesia Evaluation  Patient identified by MRN, date of birth, ID band Patient awake    Reviewed: Allergy & Precautions, H&P , NPO status , Patient's Chart, lab work & pertinent test results  Airway Mallampati: II  TM Distance: >3 FB Neck ROM: Full    Dental no notable dental hx. (+) Teeth Intact, Dental Advisory Given, Chipped   Pulmonary neg pulmonary ROS, former smoker,    Pulmonary exam normal breath sounds clear to auscultation       Cardiovascular hypertension, Pt. on medications  Rhythm:Regular Rate:Normal     Neuro/Psych Anxiety Depression negative neurological ROS     GI/Hepatic Neg liver ROS, GERD  Medicated and Controlled,  Endo/Other  diabetes, Type 1, Insulin DependentHypothyroidism Morbid obesity  Renal/GU negative Renal ROS  negative genitourinary   Musculoskeletal  (+) Arthritis , Osteoarthritis,    Abdominal   Peds  Hematology negative hematology ROS (+)   Anesthesia Other Findings Upper left incisor chipped.  Nothing loose.  Reproductive/Obstetrics negative OB ROS                           Anesthesia Physical Anesthesia Plan  ASA: III  Anesthesia Plan: General and Regional   Post-op Pain Management: MAC Combined w/ Regional for Post-op pain   Induction: Intravenous  Airway Management Planned: Oral ETT  Additional Equipment:   Intra-op Plan:   Post-operative Plan: Extubation in OR  Informed Consent: I have reviewed the patients History and Physical, chart, labs and discussed the procedure including the risks, benefits and alternatives for the proposed anesthesia with the patient or authorized representative who has indicated his/her understanding and acceptance.   Dental advisory given  Plan Discussed with: CRNA  Anesthesia Plan Comments:         Anesthesia Quick Evaluation

## 2016-02-19 NOTE — Discharge Instructions (Signed)

## 2016-02-19 NOTE — Progress Notes (Signed)
Utilization review completed.  

## 2016-02-19 NOTE — Anesthesia Postprocedure Evaluation (Signed)
Anesthesia Post Note  Patient: Morgan Taylor  Procedure(s) Performed: Procedure(s) (LRB): LEFT TOTAL SHOULDER ARTHROPLASTY (Left)  Patient location during evaluation: PACU Anesthesia Type: General and Regional Level of consciousness: awake and alert Pain management: pain level controlled Vital Signs Assessment: post-procedure vital signs reviewed and stable Respiratory status: spontaneous breathing, nonlabored ventilation, respiratory function stable and patient connected to nasal cannula oxygen Cardiovascular status: blood pressure returned to baseline and stable Postop Assessment: no signs of nausea or vomiting Anesthetic complications: no    Last Vitals:  Filed Vitals:   02/19/16 1330 02/19/16 1345  BP: 119/65   Pulse: 87 72  Temp:    Resp: 17 12    Last Pain: There were no vitals filed for this visit.               Alan Riles,W. EDMOND

## 2016-02-19 NOTE — Anesthesia Procedure Notes (Addendum)
Anesthesia Regional Block:  Interscalene brachial plexus block  Pre-Anesthetic Checklist: ,, timeout performed, Correct Patient, Correct Site, Correct Laterality, Correct Procedure, Correct Position, site marked, Risks and benefits discussed, pre-op evaluation,  At surgeon's request and post-op pain management  Laterality: Left  Prep: Maximum Sterile Barrier Precautions used and chloraprep       Needles:  Injection technique: Single-shot  Needle Type: Echogenic Stimulator Needle     Needle Length: 5cm 5 cm Needle Gauge: 22 and 22 G    Additional Needles:  Procedures: ultrasound guided (picture in chart) and nerve stimulator Interscalene brachial plexus block  Nerve Stimulator or Paresthesia:  Response: Biceps response,   Additional Responses:   Narrative:  Start time: 02/19/2016 10:00 AM End time: 02/19/2016 10:09 AM Injection made incrementally with aspirations every 5 mL. Anesthesiologist: Roderic Palau  Additional Notes: 2% Lidocaine skin wheel.    Procedure Name: Intubation Date/Time: 02/19/2016 10:38 AM Performed by: Terrill Mohr Pre-anesthesia Checklist: Patient identified, Emergency Drugs available, Suction available and Patient being monitored Patient Re-evaluated:Patient Re-evaluated prior to inductionOxygen Delivery Method: Circle system utilized Preoxygenation: Pre-oxygenation with 100% oxygen Intubation Type: IV induction Ventilation: Mask ventilation without difficulty Laryngoscope Size: Mac and 3 Grade View: Grade I Tube type: Oral Tube size: 7.5 mm Number of attempts: 1 Airway Equipment and Method: Stylet Placement Confirmation: ETT inserted through vocal cords under direct vision,  breath sounds checked- equal and bilateral and positive ETCO2 Secured at: 21 (cm at teeth) cm Tube secured with: Tape Dental Injury: Teeth and Oropharynx as per pre-operative assessment

## 2016-02-19 NOTE — Op Note (Signed)
02/19/2016  12:38 PM  PATIENT:  Morgan Taylor    PRE-OPERATIVE DIAGNOSIS:  PRIMARY OSTEOARTHRITIS LEFT SHOULDER   POST-OPERATIVE DIAGNOSIS:  Same  PROCEDURE:  LEFT TOTAL SHOULDER ARTHROPLASTY  SURGEON:  Johnny Bridge, MD  PHYSICIAN ASSISTANT: Joya Gaskins, OPA-C, present and scrubbed throughout the case, critical for completion in a timely fashion, and for retraction, instrumentation, and closure.  ANESTHESIA:   General  PREOPERATIVE INDICATIONS:  Morgan Taylor is a  55 y.o. female with a diagnosis of PRIMARY OSTEOARTHRITIS LEFT SHOULDER  who failed conservative measures and elected for surgical management.    The risks benefits and alternatives were discussed with the patient preoperatively including but not limited to the risks of infection, bleeding, nerve injury, cardiopulmonary complications, the need for revision surgery, dislocation, loosening, incomplete relief of pain, among others, and the patient was willing to proceed.   OPERATIVE IMPLANTS: Biomet size 9 mini press-fit humeral stem, size 42+18 Versa-dial humeral head, set in the E position with increased coverage superiorly, with a small cemented glenoid polyethylene 3 peg implant with a central regenerex noncemented post.   OPERATIVE FINDINGS: Advanced glenohumeral osteoarthritis involving the glenoid and the humeral head with substantial osteophyte formation inferiorly. The glenohumeral joint was extremely tight, and access was very difficult. I had to make the humeral cut twice, and ended up slightly lower than is typical, in order to gain access to the glenoid. The rotator cuff was still intact.   OPERATIVE PROCEDURE: The patient was brought to the operating room and placed in the supine position. General anesthesia was administered. IV antibiotics were given.  The upper extremity was prepped and draped in usual sterile fashion. The patient was in a beachchair position with all bony prominences padded.   Time out was  performed and a deltopectoral approach was carried out. The biceps tendon was tenodesed to the pectoralis tendon. The subscapularis was released, tagging it with a #2 MaxBraid, leaving a cuff of tendon for repair.   The inferior osteophyte was removed, and release of the capsule off of the humeral side was completed. The head was dislocated, and I reamed sequentially. I placed the humeral cutting guide at 30 of retroversion, and then pinned this into place, and made my humeral neck cut. This was at the appropriate level.   I then placed deep retractors and exposed the glenoid. Initially I did not have good affix S, and had to go back and I recut the humerus for improved exposure. I excised the labrum circumferentially, taking care to protect the axillary nerve inferiorly.   I then placed a guidewire into the center position, controlling appropriate version and inclination. I then reamed over the guidewire with the small reamer, and was satisfied with the preparation. I preserved the subchondral bone in order to maximize the strength and minimize the risk for subsequent subsidence.   I then drilled the central hole for the regenerex peg, and then placed the guide, and then drilled the 3 peripheral peg holes. I had excellent bony circumferential contact. I had unicortical purchase on all of the holes with the exception of the superior hole, which was partially exiting superiorly. I removed more anterior bone and then posterior bone, correcting the posterior wear.  I then cleaned the glenoid, irrigated it copiously, and then dried it and cemented the prosthesis into place. Excellent seating was achieved. I had full exposure. The cement cured, and then I turned my attention to the humeral side.   I sequentially broached, up to  the selected size, with the broach set at 30 of retroversion. Before I had made the second humeral cut I had reamed to an 8, however after making the second cut, when I broached up  to the eighth 8 did not have adequate purchase so I went back and reamed to a 9, and then was able to secure the implant into position and had good press-fit fill.  I then placed the real stem. I trialed with multiple heads, and the above-named component was selected. Increased superior coverage improved the coverage. The soft tissue tension was appropriate.   I then impacted the real humeral head into place, reduced the head, and irrigated copiously. Excellent stability and range of motion was achieved. I repaired the subscapularis with 4 #2 MaxBraid, as well as the rotator interval, and irrigated copiously once more. The subcutaneous tissue was closed with Vicryl including the deltopectoral fascia.   The skin was closed with Steri-Strips and sterile gauze was applied. She had a preoperative nerve block. She tolerated the procedure well and there were no complications.

## 2016-02-19 NOTE — H&P (Signed)
PREOPERATIVE H&P  Chief Complaint: PRIMARY OSTEOARTHRITIS LEFT SHOULDER   HPI: Morgan Taylor is a 55 y.o. female who presents for preoperative history and physical with a diagnosis of PRIMARY OSTEOARTHRITIS LEFT SHOULDER . Symptoms are rated as moderate to severe, and have been worsening.  This is significantly impairing activities of daily living.  She has elected for surgical management. She's had symptoms for approximately a year, and her pain is up to 9/10 depending on activities. She gets bilateral pain, but the left is worse. She's had previous shoulder arthroscopy done by Dr. French Ana, which did not improve her symptoms. She's failed injections activity modification and previous arthroscopic intervention.  Past Medical History  Diagnosis Date  . Diabetes mellitus   . Hypertension   . Arthritis   . Family history of anesthesia complication     Patients son has difficulty waking up after anesthesia  . Hypothyroidism   . Anxiety   . Depression   . Cancer [facility]) 2009    lumpectomy  . Hyperlipemia   . GERD (gastroesophageal reflux disease)   . Family history of breast cancer   . Family history of pancreatic cancer    Past Surgical History  Procedure Laterality Date  . Appendectomy    . Shoulder arthroscopy Left     cleaned out joint  . Cesarean section    . Dilation and curettage of uterus    . Breast lumpectomy Left   . Colonoscopy    . Total knee arthroplasty Right 08/15/2014    Procedure: RIGHT TOTAL KNEE ARTHROPLASTY;  Surgeon: Yvette Rack., MD;  Location: [place];  Service: Orthopedics;  Laterality: Right;  . Total knee arthroplasty Left 01/23/2015    Procedure: TOTAL KNEE ARTHROPLASTY;  Surgeon: Yvette Rack., MD;  Location: [place];  Service: Orthopedics;  Laterality: Left;   Social History   Social History  . Marital Status: Divorced    Spouse Name: N/A  . Number of Children: 5  . Years of Education: N/A   Social History Main Topics  . Smoking status: Former  Research scientist (life sciences)  . Smokeless tobacco: Never Used  . Alcohol Use: No  . Drug Use: No  . Sexual Activity: Not Currently   Other Topics Concern  . None   Social History Narrative   Family History  Problem Relation Age of Onset  . Colon cancer Neg Hx   . Rectal cancer Neg Hx   . Stomach cancer Neg Hx   . Cancer Paternal Grandmother     NOS  . Pancreatic cancer Paternal Aunt   . Cancer Paternal Aunt     NOS  . Breast cancer Mother 20    Stage 3   Allergies  Allergen Reactions  . Lyrica [Pregabalin] Swelling    Hands, legs, feet  . Sulfonamide Derivatives Hives   Prior to Admission medications   Medication Sig Start Date End Date Taking? Authorizing Provider  amLODipine (NORVASC) 10 MG tablet Take 10 mg by mouth every morning.    Yes Historical Provider, MD  atorvastatin (LIPITOR) 20 MG tablet Take 20 mg by mouth every morning.   Yes Historical Provider, MD  insulin glargine (LANTUS) 100 UNIT/ML injection Inject 30 Units into the skin at bedtime.    Yes Historical Provider, MD  levothyroxine (SYNTHROID, LEVOTHROID) 75 MCG tablet 75 mcg. 01/05/16  Yes Historical Provider, MD  Liraglutide (VICTOZA) 18 MG/3ML SOPN Inject 1.8 mg into the skin daily.   Yes Historical Provider, MD  losartan (COZAAR)  100 MG tablet Take 100 mg by mouth every morning.   Yes Historical Provider, MD  methylphenidate (RITALIN) 10 MG tablet Take 20 mg by mouth 2 (two) times daily. Every morning and at 12 Noon 12/08/15  Yes Historical Provider, MD  mirtazapine (REMERON) 15 MG tablet Take 15 mg by mouth at bedtime. 12/08/15  Yes Historical Provider, MD  omeprazole (PRILOSEC) 20 MG capsule Take 20 mg by mouth every morning.    Yes Historical Provider, MD  Oxycodone HCl 10 MG TABS Take 5 mg by mouth daily as needed (for pain). Dosage to be increased 01/22/14 to 15 mg   Yes Historical Provider, MD  pioglitazone (ACTOS) 15 MG tablet Take 15 mg by mouth every morning.   Yes Historical Provider, MD  oxyCODONE-acetaminophen  (PERCOCET) 10-325 MG per tablet Take 1 tablet by mouth every 4 (four) hours as needed for pain.    Historical Provider, MD     Positive ROS: All other systems have been reviewed and were otherwise negative with the exception of those mentioned in the HPI and as above.  Physical Exam: General: Alert, no acute distress Cardiovascular: No pedal edema Respiratory: No cyanosis, no use of accessory musculature GI: No organomegaly, abdomen is soft and non-tender Skin: No lesions in the area of chief complaint Neurologic: Sensation intact distally Psychiatric: Patient is competent for consent with normal mood and affect Lymphatic: No axillary or cervical lymphadenopathy  MUSCULOSKELETAL: Left shoulder active motion is 0-140, but above 90 she gets severe pain. External rotation is to 30. Internal rotation is to the lower lumbar level.  Assessment: PRIMARY OSTEOARTHRITIS LEFT SHOULDER    Plan: Plan for Procedure(s): LEFT TOTAL SHOULDER ARTHROPLASTY  The risks benefits and alternatives were discussed with the patient including but not limited to the risks of nonoperative treatment, versus surgical intervention including infection, bleeding, nerve injury,  blood clots, cardiopulmonary complications, morbidity, mortality, among others, and they were willing to proceed. We have also discussed the risk of persistent shoulder pain, incomplete relief of symptoms, stiffness, loss of function, need for future revision surgery particular given her young age.  Johnny Bridge, MD Cell [phone]   02/19/2016 9:56 AM

## 2016-02-20 LAB — CBC
HCT: 37.1 % (ref 36.0–46.0)
Hemoglobin: 11.6 g/dL — ABNORMAL LOW (ref 12.0–15.0)
MCH: 28.6 pg (ref 26.0–34.0)
MCHC: 31.3 g/dL (ref 30.0–36.0)
MCV: 91.4 fL (ref 78.0–100.0)
Platelets: 249 10*3/uL (ref 150–400)
RBC: 4.06 MIL/uL (ref 3.87–5.11)
RDW: 13.7 % (ref 11.5–15.5)
WBC: 7.7 10*3/uL (ref 4.0–10.5)

## 2016-02-20 LAB — BASIC METABOLIC PANEL
ANION GAP: 6 (ref 5–15)
BUN: 9 mg/dL (ref 6–20)
CHLORIDE: 105 mmol/L (ref 101–111)
CO2: 26 mmol/L (ref 22–32)
CREATININE: 0.87 mg/dL (ref 0.44–1.00)
Calcium: 8.5 mg/dL — ABNORMAL LOW (ref 8.9–10.3)
GFR calc non Af Amer: >60 mL/min (ref >60)
Glucose, Bld: 149 mg/dL — ABNORMAL HIGH (ref 65–99)
POTASSIUM: 3.7 mmol/L (ref 3.5–5.1)
SODIUM: 137 mmol/L (ref 135–145)

## 2016-02-20 LAB — GLUCOSE, CAPILLARY
GLUCOSE-CAPILLARY: 213 mg/dL — AB (ref 65–99)
Glucose-Capillary: 170 mg/dL — ABNORMAL HIGH (ref 65–99)
Glucose-Capillary: 166 mg/dL — ABNORMAL HIGH (ref 65–99)
Glucose-Capillary: 175 mg/dL — ABNORMAL HIGH (ref 65–99)

## 2016-02-20 MED ORDER — HYDROMORPHONE HCL 1 MG/ML IJ SOLN
1.0000 mg | INTRAMUSCULAR | Status: DC | PRN
  Administered 2016-02-20 – 2016-02-21 (×2): 1 mg via INTRAVENOUS
  Filled 2016-02-20 (×2): qty 1

## 2016-02-20 NOTE — Evaluation (Signed)
Occupational Therapy Evaluation Patient Details Name: Morgan Taylor MRN: [id number] DOB: 01/20/1961 Today's Date: 02/20/2016    History of Present Illness Pt is a 55 y.o. female s/p L TSA. PMHx: DM, HTN, Arithritis, Hypothyroidism, Anxiety, Depression, Cancer, Bil TKA.     Clinical Impression   Pt reports she was managing ADLs independently and was independent with mobility PTA. Currently pt is overall supervision for safety with functional mobility and min assist for ADLs. Pt tolerating ROM exercises to L UE (elbow, wrist, hand). Educated on shoulder precautions, safety, and managing ADLs. Pt planning to d/c home with 24/7 supervision from family. Pt would benefit from continued skilled OT to address established goals.     Follow Up Recommendations  Supervision - Intermittent;Other (comment) (follow up per MD)    Equipment Recommendations  None recommended by OT    Recommendations for Other Services       Precautions / Restrictions Precautions Precautions: Shoulder Type of Shoulder Precautions: Conservative Protocol: NO AROM/PROM shoulder. AROM elbow, wrist, hand OK. Shoulder Interventions: Shoulder sling/immobilizer;At all times;Off for dressing/bathing/exercises Precaution Booklet Issued: Yes (comment) Precaution Comments: Educated pt and daughter on all precautions. Required Braces or Orthoses: Sling Restrictions Weight Bearing Restrictions: Yes LUE Weight Bearing: Non weight bearing      Mobility Bed Mobility               General bed mobility comments: Pt OOB in chair upon arrival.  Transfers Overall transfer level: Needs assistance Equipment used: None Transfers: Sit to/from Stand Sit to Stand: Supervision         General transfer comment: Supervision for safety. VCs for backing up to chair with legs touching before sitting down. Good hand placement and technique. Sit to stand from chair x 1, toilet x 1.    Balance Overall balance assessment: No  apparent balance deficits (not formally assessed)                                          ADL Overall ADL's : Needs assistance/impaired Eating/Feeding: Set up;Sitting   Grooming: Supervision/safety;Wash/dry hands;Standing   Upper Body Bathing: Minimal assitance;Sitting Upper Body Bathing Details (indicate cue type and reason): Educated on shower seat for tub to sit down when bathing for safety. Educated on UB bathing technique. Discussed sponge bathing until cleared by MD to shower. Lower Body Bathing: Supervison/ safety;Sit to/from stand   Upper Body Dressing : Minimal assistance;Sitting Upper Body Dressing Details (indicate cue type and reason): Educated on UB dressing technique. Mod assist at this time to don sling. Lower Body Dressing: Minimal assistance;Sit to/from stand Lower Body Dressing Details (indicate cue type and reason): For socks and shoes. Pt reports family can assist as needed. Toilet Transfer: Supervision/safety;Ambulation;Comfort height toilet Toilet Transfer Details (indicate cue type and reason): Discussed use of 3 in 1 over toilet at home since it is low to the ground. Toileting- Clothing Manipulation and Hygiene: Supervision/safety;Sit to/from Nurse, children's Details (indicate cue type and reason): Educated on safety with tub transfer; having someone provide supervision or hand held support if needed. Shower chair to sit in tub. Functional mobility during ADLs: Supervision/safety General ADL Comments: Pts daughter present for OT eval. Educated on LUE positioning in bed and chair, sling management and wear schedule, ice for edema and pain, LUE elbow/wrist/hand exercises; pt verbalized understanding.     Vision  Perception     Praxis      Pertinent Vitals/Pain Pain Assessment: 0-10 Pain Score: 10-Worst pain ever Pain Location: L shoulder Pain Descriptors / Indicators: Aching;Grimacing;Operative site guarding;Guarding Pain  Intervention(s): Limited activity within patient's tolerance;Monitored during session;Premedicated before session;Repositioned;Ice applied     Hand Dominance Right   Extremity/Trunk Assessment Upper Extremity Assessment Upper Extremity Assessment: LUE deficits/detail LUE Deficits / Details: wrist, hand AROM WLF. Elbow AROM limited secondary to edema (2/4).  LUE: Unable to fully assess due to pain;Unable to fully assess due to immobilization   Lower Extremity Assessment Lower Extremity Assessment: Overall WFL for tasks assessed   Cervical / Trunk Assessment Cervical / Trunk Assessment: Normal   Communication Communication Communication: No difficulties   Cognition Arousal/Alertness: Awake/alert Behavior During Therapy: WFL for tasks assessed/performed Overall Cognitive Status: Within Functional Limits for tasks assessed                     General Comments       Exercises Exercises: Shoulder     Shoulder Instructions Shoulder Instructions Donning/doffing shirt without moving shoulder: Minimal assistance (educated) Method for sponge bathing under operated UE: Minimal assistance (educated) Donning/doffing sling/immobilizer: Moderate assistance (educated) Correct positioning of sling/immobilizer: Minimal assistance (educated) ROM for elbow, wrist and digits of operated UE: Supervision/safety (educated) Sling wearing schedule (on at all times/off for ADL's): Modified independent (educated) Proper positioning of operated UE when showering: Supervision/safety (educated) Positioning of UE while sleeping: Supervision/safety (educated)    Home Living Family/patient expects to be discharged to:: Private residence Living Arrangements: [place] at Discharge: Family;Available 24 hours/day Type of Home: House Home Access: Stairs to enter [employer] of Steps: 4-5 Entrance Stairs-Rails: Left Home Layout: One level     Bathroom Shower/Tub:  Tub/shower unit Shower/tub characteristics: Architectural technologist: Standard Bathroom Accessibility: Yes How Accessible: Accessible via walker Home Equipment: Eva - 2 wheels;Bedside commode;Shower seat          Prior Functioning/Environment Level of Independence: Independent             OT Diagnosis: Generalized weakness;Acute pain   OT Problem List: Decreased strength;Decreased range of motion;Decreased knowledge of use of DME or AE;Decreased knowledge of precautions;Pain   OT Treatment/Interventions: Self-care/ADL training;Therapeutic exercise;Energy conservation;DME and/or AE instruction;Patient/family education    OT Goals(Current goals can be found in the care plan section) Acute Rehab OT Goals Patient Stated Goal: get pain under control OT Goal Formulation: With patient Time For Goal Achievement: 03/05/16 Potential to Achieve Goals: Good ADL Goals Pt Will Perform Upper Body Bathing: with supervision;sitting Pt Will Perform Upper Body Dressing: with supervision;sitting Pt Will Perform Tub/Shower Transfer: Tub transfer;with supervision;ambulating;shower seat Pt/caregiver will Perform Home Exercise Program: Increased ROM;Left upper extremity;Independently;With written HEP provided Additional ADL Goal #1: Pt will independently don/doff sling for increased safety with ADLs and functional mobility.  OT Frequency: Min 2X/week   Barriers to D/C:            Co-evaluation              End of Session Equipment Utilized During Treatment: Gait belt;Other (comment) (sling) Nurse Communication: Other (comment) (IV beeping)  Activity Tolerance: Patient tolerated treatment well Patient left: in chair;with call bell/phone within reach;with family/visitor present   Time: [id number] OT Time Calculation (min): 29 min Charges:  OT General Charges $OT Visit: 1 Procedure OT Evaluation $OT Eval Moderate Complexity: 1 Procedure OT Treatments $Self Care/Home Management :  8-22 mins G-Codes:  Binnie Kand M.S., OTR/L Pager: [phone]  02/20/2016, 9:10 AM

## 2016-02-20 NOTE — Progress Notes (Signed)
PT Cancellation Note  Patient Details Name: Morgan Taylor MRN: [id number] DOB: 10/11/1961   Cancelled Treatment:    Reason Eval/Treat Not Completed: PT screened, no needs identified, will sign off (pt seen by OT and stated no needs)   Lanetta Inch Beth 02/20/2016, 8:55 AM Elwyn Reach, [place]

## 2016-02-20 NOTE — Progress Notes (Addendum)
     Subjective:  POD#1 L total shoulder arthroplasty. Patient reports pain as moderate.  Up to a chair this morning. Will see how the patient does on oral pain medication this morning.  If pain controlled will plan for discharge to home later today.   Objective:   VITALS:   Filed Vitals:   02/19/16 1605 02/19/16 2002 02/20/16 0020 02/20/16 0603  BP: 114/69 126/61 119/58 139/65  Pulse: 78 88 83 98  Temp: 98.1 F (36.7 C) 98.6 F (37 C) 98.8 F (37.1 C) 98.6 F (37 C)  TempSrc: Oral Oral Oral Oral  Resp: 18 18 16 18   Height:      Weight:      SpO2: 99% 96% 90% 93%    Neurologically intact ABD soft Neurovascular intact Sensation intact distally Intact pulses distally Incision: dressing C/D/I Sling to the L arm  Lab Results  Component Value Date   WBC 7.7 02/20/2016   HGB 11.6* 02/20/2016   HCT 37.1 02/20/2016   MCV 91.4 02/20/2016   PLT 249 02/20/2016   BMET    Component Value Date/Time   NA 137 02/20/2016 0345   NA 143 10/07/2015 1540   K 3.7 02/20/2016 0345   K 3.4* 10/07/2015 1540   CL 105 02/20/2016 0345   CO2 26 02/20/2016 0345   CO2 29 10/07/2015 1540   GLUCOSE 149* 02/20/2016 0345   GLUCOSE 198* 10/07/2015 1540   BUN 9 02/20/2016 0345   BUN 13.1 10/07/2015 1540   CREATININE 0.87 02/20/2016 0345   CREATININE 0.8 10/07/2015 1540   CALCIUM 8.5* 02/20/2016 0345   CALCIUM 9.4 10/07/2015 1540   GFRNONAA >60 02/20/2016 0345   GFRAA >60 02/20/2016 0345     Assessment/Plan: 1 Day Post-Op   Active Problems:   S/P shoulder replacement   Up with therapy Sling at all times, no motion of the L shoulder   Morgan Taylor 02/20/2016, 7:08 AM Cell [id number]

## 2016-02-20 NOTE — Discharge Summary (Signed)
Physician Discharge Summary  Patient ID: Morgan Taylor MRN: [id number] DOB/AGE: 54-Feb-1962 54 y.o.  Admit date: 02/19/2016 Discharge date: 02/20/2016  Admission Diagnoses:  <principal problem not specified>  Discharge Diagnoses:  Active Problems:   S/P shoulder replacement   Past Medical History  Diagnosis Date  . Diabetes mellitus   . Hypertension   . Arthritis   . Family history of anesthesia complication     Patients son has difficulty waking up after anesthesia  . Hypothyroidism   . Anxiety   . Depression   . Cancer [facility]) 2009    lumpectomy  . Hyperlipemia   . GERD (gastroesophageal reflux disease)   . Family history of breast cancer   . Family history of pancreatic cancer     Surgeries: Procedure(s): LEFT TOTAL SHOULDER ARTHROPLASTY on 02/19/2016   Consultants (if any):    Discharged Condition: Improved  Hospital Course: Morgan Taylor is an 55 y.o. female who was admitted 02/19/2016 with a diagnosis of <principal problem not specified> and went to the operating room on 02/19/2016 and underwent the above named procedures.    She was given perioperative antibiotics:  Anti-infectives    Start     Dose/Rate Route Frequency Ordered Stop   02/19/16 2230  vancomycin (VANCOCIN) IVPB 1000 mg/200 mL premix     1,000 mg 200 mL/hr over 60 Minutes Intravenous Every 12 hours 02/19/16 1614 02/19/16 2123   02/19/16 1830  ceFAZolin (ANCEF) IVPB 2 g/50 mL premix     2 g 100 mL/hr over 30 Minutes Intravenous 3 times per day 02/19/16 1614 02/20/16 0653   02/19/16 0600  ceFAZolin (ANCEF) IVPB 2 g/50 mL premix     2 g 100 mL/hr over 30 Minutes Intravenous To [facility] 02/18/16 1107 02/19/16 1055    .  She was given sequential compression devices, early ambulation, for DVT prophylaxis.  She benefited maximally from the hospital stay and there were no complications.    Recent vital signs:  Filed Vitals:   02/20/16 0020 02/20/16 0603  BP: 119/58 139/65  Pulse: 83 98   Temp: 98.8 F (37.1 C) 98.6 F (37 C)  Resp: 16 18    Recent laboratory studies:  Lab Results  Component Value Date   HGB 11.6* 02/20/2016   HGB 12.5 02/10/2016   HGB 12.7 10/07/2015   Lab Results  Component Value Date   WBC 7.7 02/20/2016   PLT 249 02/20/2016   Lab Results  Component Value Date   INR 1.07 01/09/2015   Lab Results  Component Value Date   NA 137 02/20/2016   K 3.7 02/20/2016   CL 105 02/20/2016   CO2 26 02/20/2016   BUN 9 02/20/2016   CREATININE 0.87 02/20/2016   GLUCOSE 149* 02/20/2016    Discharge Medications:     Medication List    STOP taking these medications        Oxycodone HCl 10 MG Tabs  Replaced by:  oxyCODONE 10 mg 12 hr tablet     oxyCODONE-acetaminophen 10-325 MG tablet  Commonly known as:  PERCOCET      TAKE these medications        amLODipine 10 MG tablet  Commonly known as:  NORVASC  Take 10 mg by mouth every morning.     atorvastatin 20 MG tablet  Commonly known as:  LIPITOR  Take 20 mg by mouth every morning.     baclofen 10 MG tablet  Commonly known as:  LIORESAL  Take 1  tablet (10 mg total) by mouth 3 (three) times daily. As needed for muscle spasm     HYDROcodone-acetaminophen 10-325 MG tablet  Commonly known as:  NORCO  Take 1-2 tablets by mouth every 6 (six) hours as needed.     insulin glargine 100 UNIT/ML injection  Commonly known as:  LANTUS  Inject 30 Units into the skin at bedtime.     levothyroxine 75 MCG tablet  Commonly known as:  SYNTHROID, LEVOTHROID  75 mcg.     losartan 100 MG tablet  Commonly known as:  COZAAR  Take 100 mg by mouth every morning.     methylphenidate 10 MG tablet  Commonly known as:  RITALIN  Take 20 mg by mouth 2 (two) times daily. Every morning and at 12 Noon     mirtazapine 15 MG tablet  Commonly known as:  REMERON  Take 15 mg by mouth at bedtime.     omeprazole 20 MG capsule  Commonly known as:  PRILOSEC  Take 20 mg by mouth every morning.     ondansetron 4  MG tablet  Commonly known as:  ZOFRAN  Take 1 tablet (4 mg total) by mouth every 8 (eight) hours as needed for nausea or vomiting.     oxyCODONE 10 mg 12 hr tablet  Commonly known as:  OXYCONTIN  Take 1 tablet (10 mg total) by mouth every 12 (twelve) hours.     pioglitazone 15 MG tablet  Commonly known as:  ACTOS  Take 15 mg by mouth every morning.     sennosides-docusate sodium 8.6-50 MG tablet  Commonly known as:  SENOKOT-S  Take 2 tablets by mouth daily.     VICTOZA 18 MG/3ML Sopn  Generic drug:  Liraglutide  Inject 1.8 mg into the skin daily.        Diagnostic Studies: Ct Shoulder Left Wo Contrast  02/05/2016  CLINICAL DATA:  Bilateral shoulder pain for several months. No acute injury or prior relevant surgery. History of breast cancer with left lumpectomy and radiation therapy in 2009. EXAM: CT OF THE LEFT SHOULDER WITHOUT CONTRAST; CT OF THE RIGHT SHOULDER WITHOUT CONTRAST TECHNIQUE: Multidetector CT imaging was performed according to the standard protocol. Multiplanar CT image reconstructions were also generated. COMPARISON:  Right shoulder MRI 07/07/2015. Chest radiographs 03/18/2015. FINDINGS: On the left, there are severe glenohumeral degenerative changes with chondral thinning and prominent osteophytes of the humeral head and glenoid. There is a possible 8 mm loose body superior to the humeral head, best seen on coronal image 53. The subacromial space is preserved. The acromion is type 2. There are minimal acromioclavicular degenerative changes. No significant shoulder joint effusion or rotator cuff muscular atrophy is identified. On the right, there are lesser glenohumeral degenerative changes with small osteophytes. There is mild subchondral cyst formation posteriorly in the glenoid. No joint effusion or loose body observed. Probable interval distal clavicle resection. No significant rotator cuff muscular atrophy identified. The visualized lungs are clear. There is no axillary  adenopathy on either side. IMPRESSION: 1. Glenohumeral degenerative changes, left greater than right. Possible small loose body on the left. 2. No acute osseous findings or evidence of metastatic disease. 3. Interval distal clavicle resection on the right. 4. No significant rotator cuff muscular atrophy identified. Electronically Signed   By: Richardean Sale M.D.   On: 02/05/2016 16:50   Ct Shoulder Right Wo Contrast  02/05/2016  CLINICAL DATA:  Bilateral shoulder pain for several months. No acute injury or prior relevant surgery.  History of breast cancer with left lumpectomy and radiation therapy in 2009. EXAM: CT OF THE LEFT SHOULDER WITHOUT CONTRAST; CT OF THE RIGHT SHOULDER WITHOUT CONTRAST TECHNIQUE: Multidetector CT imaging was performed according to the standard protocol. Multiplanar CT image reconstructions were also generated. COMPARISON:  Right shoulder MRI 07/07/2015. Chest radiographs 03/18/2015. FINDINGS: On the left, there are severe glenohumeral degenerative changes with chondral thinning and prominent osteophytes of the humeral head and glenoid. There is a possible 8 mm loose body superior to the humeral head, best seen on coronal image 53. The subacromial space is preserved. The acromion is type 2. There are minimal acromioclavicular degenerative changes. No significant shoulder joint effusion or rotator cuff muscular atrophy is identified. On the right, there are lesser glenohumeral degenerative changes with small osteophytes. There is mild subchondral cyst formation posteriorly in the glenoid. No joint effusion or loose body observed. Probable interval distal clavicle resection. No significant rotator cuff muscular atrophy identified. The visualized lungs are clear. There is no axillary adenopathy on either side. IMPRESSION: 1. Glenohumeral degenerative changes, left greater than right. Possible small loose body on the left. 2. No acute osseous findings or evidence of metastatic disease. 3.  Interval distal clavicle resection on the right. 4. No significant rotator cuff muscular atrophy identified. Electronically Signed   By: Richardean Sale M.D.   On: 02/05/2016 16:50   Dg Shoulder Left Port  02/19/2016  CLINICAL DATA:  Status post LEFT shoulder replacement. EXAM: LEFT SHOULDER - 1 VIEW COMPARISON:  CT 02/05/2016 FINDINGS: LEFT total shoulder arthroplasty. Satisfactory position and alignment. IMPRESSION: As above. Electronically Signed   By: Staci Righter M.D.   On: 02/19/2016 14:55   US Breast Ltd Uni Left Inc Axilla  02/10/2016  CLINICAL DATA:  Left lumpectomy 2009 with radiation therapy and pain 6 o'clock periareolar area currently EXAM: DIGITAL DIAGNOSTIC LEFT MAMMOGRAM WITH 3D TOMOSYNTHESIS WITH CAD ULTRASOUND LEFT BREAST COMPARISON:  Previous exam(s). ACR Breast Density Category b: There are scattered areas of fibroglandular density. FINDINGS: Stable postsurgical scar central left breast. No suspicious findings. Mammographic images were processed with CAD. On physical exam, there is infra-areolar scarring. There are no suspicious findings. Targeted ultrasound is performed, showing no abnormalities in the area of pain. IMPRESSION: No suspicious findings.  Anticipated postoperative appearance. RECOMMENDATION: Remain on schedule for annual mammography which will be due October 2017 I have discussed the findings and recommendations with the patient. Results were also provided in writing at the conclusion of the visit. If applicable, a reminder letter will be sent to the patient regarding the next appointment. BI-RADS CATEGORY  2: Benign. Electronically Signed   By: Skipper Cliche M.D.   On: 02/10/2016 08:50   Mm Diag Breast Tomo Uni Left  02/10/2016  CLINICAL DATA:  Left lumpectomy 2009 with radiation therapy and pain 6 o'clock periareolar area currently EXAM: DIGITAL DIAGNOSTIC LEFT MAMMOGRAM WITH 3D TOMOSYNTHESIS WITH CAD ULTRASOUND LEFT BREAST COMPARISON:  Previous exam(s). ACR Breast  Density Category b: There are scattered areas of fibroglandular density. FINDINGS: Stable postsurgical scar central left breast. No suspicious findings. Mammographic images were processed with CAD. On physical exam, there is infra-areolar scarring. There are no suspicious findings. Targeted ultrasound is performed, showing no abnormalities in the area of pain. IMPRESSION: No suspicious findings.  Anticipated postoperative appearance. RECOMMENDATION: Remain on schedule for annual mammography which will be due October 2017 I have discussed the findings and recommendations with the patient. Results were also provided in writing at the conclusion of the  visit. If applicable, a reminder letter will be sent to the patient regarding the next appointment. BI-RADS CATEGORY  2: Benign. Electronically Signed   By: Skipper Cliche M.D.   On: 02/10/2016 08:50    Disposition: 01-Home or Self Care        Follow-up Information    Follow up with Johnny Bridge, MD. Schedule an appointment as soon as possible for a visit in 2 weeks.   Specialty:  Orthopedic Surgery   Contact information:   [place] [id number] [phone]        Signed: Johnny Bridge 02/20/2016, 11:00 AM

## 2016-02-21 LAB — GLUCOSE, CAPILLARY: Glucose-Capillary: 186 mg/dL — ABNORMAL HIGH (ref 65–99)

## 2016-02-21 NOTE — Progress Notes (Signed)
Occupational Therapy Treatment Patient Details Name: Morgan Taylor MRN: [id number] DOB: Nov 13, 1961 Today's Date: 02/21/2016    History of present illness Pt is a 55 y.o. female s/p L TSA. PMHx: DM, HTN, Arithritis, Hypothyroidism, Anxiety, Depression, Cancer, Bil TKA.     OT comments  Provided continued education with don/doff sling along with elbow, wrist, and digit rom. Pt. Able to verbalize and demonstrate safe techniques.  D/c home later today.    Follow Up Recommendations  Supervision - Intermittent;Other (comment)    Equipment Recommendations  None recommended by OT    Recommendations for Other Services      Precautions / Restrictions Precautions Precautions: Shoulder Type of Shoulder Precautions: Conservative Protocol: NO AROM/PROM shoulder. AROM elbow, wrist, hand OK. Shoulder Interventions: Shoulder sling/immobilizer;At all times;Off for dressing/bathing/exercises Restrictions Weight Bearing Restrictions: No LUE Weight Bearing: Non weight bearing       Mobility Bed Mobility                  Transfers                      Balance                                   ADL                                                Vision                     Perception     Praxis      Cognition                             Extremity/Trunk Assessment               Exercises Shoulder Exercises Elbow Flexion: Self ROM;AAROM;Left;10 reps;Seated Wrist Flexion: AROM;Left;10 reps;Seated Digit Composite Flexion: AROM;Left;10 reps;Seated Donning/doffing sling/immobilizer: Moderate assistance ROM for elbow, wrist and digits of operated UE: Supervision/safety Sling wearing schedule (on at all times/off for ADL's): Modified independent   Shoulder Instructions Shoulder Instructions Donning/doffing sling/immobilizer: Moderate assistance ROM for elbow, wrist and digits of operated UE:  Supervision/safety Sling wearing schedule (on at all times/off for ADL's): Modified independent     General Comments      Pertinent Vitals/ Pain          Home Living                                          Prior Functioning/Environment              Frequency Min 2X/week     Progress Toward Goals  OT Goals(current goals can now be found in the care plan section)  Progress towards OT goals: Progressing toward goals     Plan Discharge plan remains appropriate    Co-evaluation                 End of Session     Activity Tolerance Patient tolerated treatment well   Patient Left in chair;with call bell/phone within reach   Nurse Communication Other (comment) (pt.  requests fresh ice and assistance with b/d when ready to go home later today)        Time: [id number] OT Time Calculation (min): 9 min  Charges: OT General Charges $OT Visit: 1 Procedure OT Treatments $Therapeutic Exercise: 8-22 mins  Janice Coffin, COTA/L 02/21/2016, 11:33 AM

## 2016-02-21 NOTE — Progress Notes (Signed)
     Subjective:  POD#2 L total shoulder arthroplasty.  Patient reports pain as moderate.  Pain is much better controlled today.  Up to a chair this morning.    Objective:   VITALS:   Filed Vitals:   02/20/16 0603 02/20/16 1500 02/20/16 2004 02/21/16 0601  BP: 139/65 112/66 134/77 124/62  Pulse: 98 104 107 104  Temp: 98.6 F (37 C) 98.6 F (37 C) 98.9 F (37.2 C) 98.6 F (37 C)  TempSrc: Oral Oral Oral Oral  Resp: 18 18 18 18   Height:      Weight:      SpO2: 93% 94% 95% 90%    Neurologically intact ABD soft Neurovascular intact Sensation intact distally Intact pulses distally Incision: dressing C/D/I Sling to the L arm  Lab Results  Component Value Date   WBC 7.7 02/20/2016   HGB 11.6* 02/20/2016   HCT 37.1 02/20/2016   MCV 91.4 02/20/2016   PLT 249 02/20/2016   BMET    Component Value Date/Time   NA 137 02/20/2016 0345   NA 143 10/07/2015 1540   K 3.7 02/20/2016 0345   K 3.4* 10/07/2015 1540   CL 105 02/20/2016 0345   CO2 26 02/20/2016 0345   CO2 29 10/07/2015 1540   GLUCOSE 149* 02/20/2016 0345   GLUCOSE 198* 10/07/2015 1540   BUN 9 02/20/2016 0345   BUN 13.1 10/07/2015 1540   CREATININE 0.87 02/20/2016 0345   CREATININE 0.8 10/07/2015 1540   CALCIUM 8.5* 02/20/2016 0345   CALCIUM 9.4 10/07/2015 1540   GFRNONAA >60 02/20/2016 0345   GFRAA >60 02/20/2016 0345     Assessment/Plan: 2 Days Post-Op   Active Problems:   S/P shoulder replacement   Up with therapy [facility] in the LUE in the sling full time when out of bed Appropriate for discharge to home today.  Will follow up as outpatient.    Brandilyn Nanninga Lelan Pons 02/21/2016, 11:01 AM Cell [phone]

## 2016-02-23 ENCOUNTER — Encounter (HOSPITAL_COMMUNITY): Payer: Self-pay | Admitting: Orthopedic Surgery

## 2016-05-09 ENCOUNTER — Encounter: Payer: Medicare Other | Attending: Endocrinology | Admitting: Skilled Nursing Facility1

## 2016-05-09 ENCOUNTER — Encounter: Payer: Self-pay | Admitting: Skilled Nursing Facility1

## 2016-05-09 VITALS — Ht 62.0 in | Wt 196.0 lb

## 2016-05-09 DIAGNOSIS — E119 Type 2 diabetes mellitus without complications: Secondary | ICD-10-CM | POA: Diagnosis present

## 2016-05-09 NOTE — Progress Notes (Signed)
Diabetes Self-Management Education  Visit Type: First/Initial  Appt. Start Time: 9:00 Appt. End Time: 10:30  05/09/2016  [name], identified by name and date of birth, is a 55 y.o. female with a diagnosis of Diabetes: Type 2.   ASSESSMENT  Height 5\' 2"  (1.575 m), weight 196 lb (88.905 kg). Body mass index is 35.84 kg/(m^2). Pt states her biggest problem is eating out. Pt states her son is 66 years old and weighs 171 pounds. Pt states she does not have an appetite.      Diabetes Self-Management Education - 05/09/16 0923    Visit Information   Visit Type First/Initial   Initial Visit   Diabetes Type Type 2   Are you currently following a meal plan? No   Are you taking your medications as prescribed? Yes   Date Diagnosed 2009   Health Coping   How would you rate your overall health? Good   Psychosocial Assessment   Patient Belief/Attitude about Diabetes Motivated to manage diabetes   Self-care barriers None   Self-management support Friends   Preferred Learning Style Auditory   Pre-Education Assessment   Patient understands the diabetes disease and treatment process. Needs Instruction   Patient understands incorporating nutritional management into lifestyle. Needs Instruction   Patient undertands incorporating physical activity into lifestyle. Needs Instruction   Patient understands using medications safely. Needs Instruction   Patient understands monitoring blood glucose, interpreting and using results Needs Instruction   Patient understands prevention, detection, and treatment of acute complications. Needs Instruction   Patient understands prevention, detection, and treatment of chronic complications. Needs Instruction   Patient understands how to develop strategies to address psychosocial issues. Needs Instruction   Patient understands how to develop strategies to promote health/change behavior. Needs Instruction   Complications   Last HgB A1C per patient/outside  source 7.5 %   How often do you check your blood sugar? 1-2 times/day   Fasting Blood glucose range (mg/dL) 130-179   Postprandial Blood glucose range (mg/dL) 130-179   Have you had a dilated eye exam in the past 12 months? Yes   Have you had a dental exam in the past 12 months? No   Are you checking your feet? Yes   How many days per week are you checking your feet? 7   Dietary Intake   Breakfast biscuit---doughnut-----none   Lunch peanut butter sandwich----banana-------fast food   Dinner toastadaa----mexican food-----hamburger helper with corn----none   Beverage(s) water   Exercise   Exercise Type Light (walking / raking leaves)   How many days per week to you exercise? 3   How many minutes per day do you exercise? 45   Total minutes per week of exercise 135   Patient Education   Previous Diabetes Education Yes (please comment)  2009   Disease state  Definition of diabetes, type 1 and 2, and the diagnosis of diabetes;Factors that contribute to the development of diabetes   Nutrition management  Role of diet in the treatment of diabetes and the relationship between the three main macronutrients and blood glucose level;Food label reading, portion sizes and measuring food.;Carbohydrate counting;Reviewed blood glucose goals for pre and post meals and how to evaluate the patients' food intake on their blood glucose level.;Meal timing in regards to the patients' current diabetes medication.;Information on hints to eating out and maintain blood glucose control.   Physical activity and exercise  Role of exercise on diabetes management, blood pressure control and cardiac health.;Identified with patient nutritional and/or medication  changes necessary with exercise.   Monitoring Purpose and frequency of SMBG.;Yearly dilated eye exam;Daily foot exams;Identified appropriate SMBG and/or A1C goals.   Acute complications Taught treatment of hypoglycemia - the 15 rule.   Chronic complications Dental  care;Lipid levels, blood glucose control and heart disease;Assessed and discussed foot care and prevention of foot problems;Retinopathy and reason for yearly dilated eye exams   Psychosocial adjustment Worked with patient to identify barriers to care and solutions;Role of stress on diabetes;Helped patient identify a support system for diabetes management;Brainstormed with patient on coping mechanisms for social situations, getting support from significant others, dealing with feelings about diabetes   Individualized Goals (developed by patient)   Nutrition Follow meal plan discussed;General guidelines for healthy choices and portions discussed;Adjust meds/carbs with exercise as discussed   Physical Activity Exercise 3-5 times per week;45 minutes per day   Medications take my medication as prescribed   Monitoring  test blood glucose pre and post meals as discussed  and before working out   Reducing Risk do foot checks daily;increase portions of nuts and seeds;treat hypoglycemia with 15 grams of carbs if blood glucose less than 70mg /dL;increase portions of healthy fats   Post-Education Assessment   Patient understands the diabetes disease and treatment process. Demonstrates understanding / competency   Patient understands incorporating nutritional management into lifestyle. Demonstrates understanding / competency   Patient undertands incorporating physical activity into lifestyle. Demonstrates understanding / competency   Patient understands using medications safely. Demonstrates understanding / competency   Patient understands monitoring blood glucose, interpreting and using results Demonstrates understanding / competency   Patient understands prevention, detection, and treatment of acute complications. Demonstrates understanding / competency   Patient understands prevention, detection, and treatment of chronic complications. Demonstrates understanding / competency   Patient understands how to  develop strategies to address psychosocial issues. Demonstrates understanding / competency   Patient understands how to develop strategies to promote health/change behavior. Demonstrates understanding / competency   Outcomes   Expected Outcomes Demonstrated interest in learning. Expect positive outcomes   Future DMSE PRN   Program Status Completed      Individualized Plan for Diabetes Self-Management Training:   Learning Objective:  Patient will have a greater understanding of diabetes self-management. Patient education plan is to attend individual and/or group sessions per assessed needs and concerns.   Plan:   There are no Patient Instructions on file for this visit.  Expected Outcomes:  Demonstrated interest in learning. Expect positive outcomes  Education material provided: Living Well with Diabetes, Meal plan card and Snack sheet  If problems or questions, patient to contact team via:  Phone  Future DSME appointment: PRN

## 2016-05-26 ENCOUNTER — Telehealth: Payer: Self-pay | Admitting: Genetic Counselor

## 2016-05-26 NOTE — Telephone Encounter (Signed)
Checked with [facility] to see if there were any updates to Morgan Taylor's previous genetic test result (11/2015) which showed two uncertain changes (VUSes)--one in the ATM gene, one in the MSH6 gene.  [facility] have not yet updated either of these VUSes.  Advised her to check back in a year or two.  We will also attempt to let her know as soon as possible if/when these get reclassified by the lab.

## 2016-09-09 LAB — GLUCOSE, POCT (MANUAL RESULT ENTRY): POC Glucose: 62 mg/dl — AB (ref 70–99)

## 2016-10-06 ENCOUNTER — Other Ambulatory Visit: Payer: Medicare Other

## 2016-10-06 ENCOUNTER — Encounter: Payer: Medicare Other | Admitting: Nurse Practitioner

## 2016-10-19 NOTE — Progress Notes (Signed)
CLINIC:  Survivorship   REASON FOR VISIT:  Routine follow-up for history of breast cancer.   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 Kubilus [facility] follow-up for her history of breast cancer.  Overall, she reports feeling quite well. The left breast burning/pain that she was feeling at her last visit when she saw Dr. Lindi Adie in 01/2016 has now resolved. She does have chronic arthritis, for which she takes diclofenac and baclofen as needed. Her joint pain has not worsened since her last visit and she has no new bone pain.  She tells me she recently had a viral illness, that caused her to have some upper respiratory symptoms & muscle aches, but these are slowly resolving. She has a history of anxiety and depression, which are well controlled by her psychiatrist Dr. Philis Fendt. Her hyperthyroidism is managed by her endocrinologist, Dr. Chalmers Cater.  Overall, her appetite and energy levels have been great. She remains active by doing water aerobics 5 days a week at [facility], which she really enjoys.  She sees her PCP, Dr. Nancy Fetter as directed. She did get her flu vaccine for this year. For fun, she enjoys being involved in her church, going to the gym, and going to her grandchildren's activities like their baseball games.   REVIEW OF SYSTEMS:  Review of Systems  Constitutional: Negative.  Negative for appetite change, fatigue and fever.  HENT:  Negative.   Eyes: Negative.   Respiratory: Negative.   Cardiovascular: Negative.   Gastrointestinal: Negative.   Endocrine: Negative.   Genitourinary: Negative.    Musculoskeletal: Positive for myalgias.  Skin:  Negative.   Neurological: Negative.   Hematological: Negative.   Psychiatric/Behavioral: Positive for depression. The patient is nervous/anxious.        Stable and well-controlled per patient  GU: Denies vaginal bleeding.  Breast: Denies any new nodularity, masses, tenderness, nipple changes, or nipple discharge.    A 14-point review of systems was completed and was negative, except as noted above.    PAST MEDICAL/SURGICAL HISTORY:  Past Medical History:  Diagnosis Date  . Anxiety   . Arthritis   . Cancer [facility]) 2009   lumpectomy  . Depression   . Diabetes mellitus   . Family history of anesthesia complication    Patients son has difficulty waking up after anesthesia  . Family history of breast cancer   . Family history of pancreatic cancer   . GERD (gastroesophageal reflux disease)   . Hyperlipemia   . Hypertension   . Hypothyroidism    Past Surgical History:  Procedure Laterality Date  . APPENDECTOMY    . BREAST LUMPECTOMY Left   . CESAREAN SECTION    . COLONOSCOPY    . DILATION AND CURETTAGE OF UTERUS    . SHOULDER ARTHROSCOPY Left    cleaned out joint  . TOTAL KNEE ARTHROPLASTY Right 08/15/2014   Procedure: RIGHT TOTAL KNEE ARTHROPLASTY;  Surgeon: Yvette Rack., MD;  Location: [facility];  Service: Orthopedics;  Laterality: Right;  . TOTAL KNEE ARTHROPLASTY Left 01/23/2015   Procedure: TOTAL KNEE ARTHROPLASTY;  Surgeon: Lockie Pares  Brooke Bonito., MD;  Location: [facility];  Service: Orthopedics;  Laterality: Left;  . TOTAL SHOULDER ARTHROPLASTY Left 02/19/2016   Procedure: LEFT TOTAL SHOULDER ARTHROPLASTY;  Surgeon: Marchia Bond, MD;  Location: [facility];  Service: Orthopedics;  Laterality: Left;     ALLERGIES:  Allergies  Allergen Reactions  . Lyrica [Pregabalin] Swelling    Hands, legs, feet  . Sulfonamide Derivatives Hives     CURRENT MEDICATIONS:  Outpatient Encounter Prescriptions as of 10/20/2016  Medication Sig Note  . amLODipine (NORVASC) 10 MG tablet Take 10 mg by  mouth every morning.    Marland Kitchen atorvastatin (LIPITOR) 20 MG tablet Take 20 mg by mouth every morning.   . baclofen (LIORESAL) 10 MG tablet Take 1 tablet (10 mg total) by mouth 3 (three) times daily. As needed for muscle spasm   . HYDROcodone-acetaminophen (NORCO) 10-325 MG tablet Take 1-2 tablets by mouth every 6 (six) hours as needed.   . insulin glargine (LANTUS) 100 UNIT/ML injection Inject 30 Units into the skin at bedtime.  02/19/2016: lantus 30 units pm 02-18-2016  . levothyroxine (SYNTHROID, LEVOTHROID) 75 MCG tablet 75 mcg.   . Liraglutide (VICTOZA) 18 MG/3ML SOPN Inject 1.8 mg into the skin daily.   Marland Kitchen losartan (COZAAR) 100 MG tablet Take 100 mg by mouth every morning.   . methylphenidate (RITALIN) 10 MG tablet Take 20 mg by mouth 2 (two) times daily. Every morning and at 12 Noon   . mirtazapine (REMERON) 15 MG tablet Take 15 mg by mouth at bedtime.   Marland Kitchen omeprazole (PRILOSEC) 20 MG capsule Take 20 mg by mouth every morning.    . ondansetron (ZOFRAN) 4 MG tablet Take 1 tablet (4 mg total) by mouth every 8 (eight) hours as needed for nausea or vomiting.   Marland Kitchen oxyCODONE (OXYCONTIN) 10 mg 12 hr tablet Take 1 tablet (10 mg total) by mouth every 12 (twelve) hours.   . pioglitazone (ACTOS) 15 MG tablet Take 15 mg by mouth every morning.   . sennosides-docusate sodium (SENOKOT-S) 8.6-50 MG tablet Take 2 tablets by mouth daily.    No facility-administered encounter medications on file as of 10/20/2016.      ONCOLOGIC FAMILY HISTORY:  Family History  Problem Relation Age of Onset  . Colon cancer Neg Hx   . Rectal cancer Neg Hx   . Stomach cancer Neg Hx   . Cancer Paternal Grandmother     NOS  . Pancreatic cancer Paternal Aunt   . Cancer Paternal Aunt     NOS  . Breast cancer Mother 37    Stage 3    GENETIC COUNSELING/TESTING: No records available for review.   HEALTH MAINTENANCE:  -Last physical with PCP:  2017 with Dr. Nancy Fetter at [place] colonoscopy: 2015 or 2016 per  patient -Last pap smear: She thinks she is due; encouraged her to talk with her PCP about getting a pap smear at her next visit (she does not have an established gynecologist) -Vaccinations: Received annual flu vaccine (2017)    SOCIAL HISTORY:  SAHIRAH CHAUMONT is single and lives in [place], [place]. She has 4 daughters and 1 son (ranging in ages from 12-37). She has 10 grandchildren, ages 71-17. She currently does not work and is disabled, but she stays busy as she cares for 7 of her 10 grandchildren daily. She denies any current tobacco, alcohol, or illicit drug use.  For fun, she enjoys being involved in her church, going to the gym, and going to her  grandchildren's activities like their baseball games.   PHYSICAL EXAMINATION:  Vital Signs: Vitals:   10/20/16 1120  BP: 128/68  Pulse: 88  Temp: 98.1 F (36.7 C)   Filed Weights   10/20/16 1120  Weight: 187 lb 12.8 oz (85.2 kg)   General: Well-nourished, well-appearing female in no acute distress.  She is here today with her 14-year-old granddaughter.   HEENT: Head is normocephalic.  Pupils equal and reactive to light. Conjunctivae clear without exudate.  Sclerae anicteric. Oral mucosa is pink, moist.  Oropharynx is pink without lesions or erythema.  Lymph: No cervical, supraclavicular, or infraclavicular lymphadenopathy noted on palpation.  Cardiovascular: Regular rate and rhythm.Marland Kitchen Respiratory: Clear to auscultation bilaterally. Chest expansion symmetric; breathing non-labored.  Breast Exam:  -Left breast: No appreciable masses on palpation. Palpable scar tissue at previous lumpectomy site.  No skin redness, thickening, or peau d'orange appearance; no nipple retraction or nipple discharge; mild distortion in symmetry at previous lumpectomy site; healed scar without erythema.  -Right breast: No appreciable masses on palpation. No skin redness, thickening, or peau d'orange appearance; no nipple retraction or nipple discharge. -Axilla: No  axillary adenopathy bilaterally.  GI: Abdomen soft and round; non-tender, non-distended. Bowel sounds normoactive. No hepatosplenomegaly.   GU: Deferred.  Neuro: No focal deficits. Steady gait.  Psych: Mood and affect normal and appropriate for situation.  Extremities: No edema. Skin: Warm and dry.  LABORATORY DATA:  None for this visit.   DIAGNOSTIC IMAGING:  Most recent mammogram: Left breast diagnostic mammo (02/10/16)   Annual mammogram: 10/13/15    ASSESSMENT AND PLAN:  Ms.. Iida is a pleasant 55 y.o. 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  [facility] for surveillance and routine follow-up.   1. History of Stage I left breast cancer:  Ms. Weems is currently clinically without evidence of disease or recurrence of breast cancer. She is due for her annual mammogram; orders placed today. We discussed that since she is now over 8 years out from her initial breast cancer diagnosis and treatment, she has the option to "graduate" from follow-up here at the cancer center. She maintains adequate follow-up with her PCP. I encouraged her to ensure that her PCP does annual clinical breast exams She should continue to receive annual  screening mammograms as well.  She agreed with this plan.  I congratulated her on this milestone of "graduating" from follow-up, and gave her a survivorship certificate, as well as a pink graduation tassel to commemorate her graduation today. Of course, we are available should there be issues in the future. I encouraged her to call me with any questions or concerns and we would be happy to see her again, as needed. She voiced understanding & appreciation and agreed with this plan.   2. Bone health:  Given Ms. Cantrell's age, history of breast  cancer, and her previous anti-estrogen therapy with Arimidex, she is at risk for bone demineralization. Her last DEXA scan was on 10/27/14 and was normal.   given that she will graduate from follow-up here, I will defer any future bone mineral density testing to her PCP, as clinically indicated. I encouraged her to keep up the good work with her aerobic exercise, as this will help maintain her bone strength over time.   3. Cancer screening:  Due to Ms. Gendreau's history  and her age, she should receive screening for skin cancers, colon cancer, and gynecologic cancers. She was encouraged to follow-up with her PCP for appropriate cancer screenings.   4. Health maintenance and wellness promotion: Ms. Clendenning was encouraged to consume 5-7 servings of fruits and vegetables per day. She was also encouraged to engage in moderate to vigorous exercise for 30 minutes per day most days of the week. She was instructed to limit her alcohol consumption and continue to abstain from tobacco use.    Dispo:  -Annual mammogram due; orders placed today for her to get this done at the [facility].  -Return to cancer center as needed. Patient "graduated" from follow-up today.     A total of 30 minutes of face-to-face time was spent with this patient with greater than 50% of that time in counseling and care-coordination.   Mike Craze, NP Survivorship Program [facility] [phone]   Note: PRIMARY CARE PROVIDER Armanda Heritage, [place] [phone]

## 2016-10-20 ENCOUNTER — Encounter: Payer: Self-pay | Admitting: Adult Health

## 2016-10-20 ENCOUNTER — Other Ambulatory Visit: Payer: Medicare HMO

## 2016-10-20 ENCOUNTER — Ambulatory Visit (HOSPITAL_BASED_OUTPATIENT_CLINIC_OR_DEPARTMENT_OTHER): Payer: Medicare HMO | Admitting: Adult Health

## 2016-10-20 VITALS — BP 128/68 | HR 88 | Temp 98.1°F | Wt 187.8 lb

## 2016-10-20 DIAGNOSIS — Z853 Personal history of malignant neoplasm of breast: Secondary | ICD-10-CM

## 2016-10-20 DIAGNOSIS — C50512 Malignant neoplasm of lower-outer quadrant of left female breast: Secondary | ICD-10-CM

## 2016-10-20 DIAGNOSIS — M2550 Pain in unspecified joint: Secondary | ICD-10-CM | POA: Diagnosis not present

## 2016-10-20 DIAGNOSIS — Z1231 Encounter for screening mammogram for malignant neoplasm of breast: Secondary | ICD-10-CM

## 2017-01-05 ENCOUNTER — Ambulatory Visit
Admission: RE | Admit: 2017-01-05 | Discharge: 2017-01-05 | Disposition: A | Discharge status: Home / Self Care | Payer: Medicare HMO | Source: Ambulatory Visit | Attending: Adult Health | Admitting: Adult Health

## 2017-01-05 DIAGNOSIS — Z1231 Encounter for screening mammogram for malignant neoplasm of breast: Secondary | ICD-10-CM

## 2017-01-30 ENCOUNTER — Other Ambulatory Visit: Payer: Self-pay | Admitting: Orthopedic Surgery

## 2017-01-30 DIAGNOSIS — M25511 Pain in right shoulder: Secondary | ICD-10-CM

## 2017-02-08 ENCOUNTER — Ambulatory Visit
Admission: RE | Admit: 2017-02-08 | Discharge: 2017-02-08 | Disposition: A | Discharge status: Home / Self Care | Payer: Medicare HMO | Source: Ambulatory Visit | Attending: Orthopedic Surgery | Admitting: Orthopedic Surgery

## 2017-02-08 ENCOUNTER — Other Ambulatory Visit: Payer: Self-pay | Admitting: Orthopedic Surgery

## 2017-02-08 ENCOUNTER — Ambulatory Visit
Admission: RE | Admit: 2017-02-08 | Discharge: 2017-02-08 | Disposition: A | Discharge status: Home / Self Care | Payer: Self-pay | Source: Ambulatory Visit | Attending: Orthopedic Surgery | Admitting: Orthopedic Surgery

## 2017-02-08 DIAGNOSIS — M25511 Pain in right shoulder: Secondary | ICD-10-CM

## 2017-03-03 ENCOUNTER — Other Ambulatory Visit: Payer: Self-pay | Admitting: Orthopedic Surgery

## 2017-03-10 ENCOUNTER — Encounter (HOSPITAL_COMMUNITY)
Admission: RE | Admit: 2017-03-10 | Discharge: 2017-03-10 | Disposition: A | Discharge status: Home / Self Care | Payer: Medicare HMO | Source: Ambulatory Visit | Attending: Orthopedic Surgery | Admitting: Orthopedic Surgery

## 2017-03-10 ENCOUNTER — Encounter (HOSPITAL_COMMUNITY): Payer: Self-pay

## 2017-03-10 DIAGNOSIS — M19011 Primary osteoarthritis, right shoulder: Secondary | ICD-10-CM | POA: Insufficient documentation

## 2017-03-10 DIAGNOSIS — Z0183 Encounter for blood typing: Secondary | ICD-10-CM | POA: Insufficient documentation

## 2017-03-10 DIAGNOSIS — Z01812 Encounter for preprocedural laboratory examination: Secondary | ICD-10-CM | POA: Diagnosis not present

## 2017-03-10 HISTORY — DX: Anesthesia of skin: R20.0

## 2017-03-10 HISTORY — DX: Cervicalgia: M54.2

## 2017-03-10 LAB — CBC
HEMATOCRIT: 42.7 % (ref 36.0–46.0)
HEMOGLOBIN: 13.6 g/dL (ref 12.0–15.0)
MCH: 28.7 pg (ref 26.0–34.0)
MCHC: 31.9 g/dL (ref 30.0–36.0)
MCV: 90.1 fL (ref 78.0–100.0)
Platelets: 259 10*3/uL (ref 150–400)
RBC: 4.74 MIL/uL (ref 3.87–5.11)
RDW: 13.6 % (ref 11.5–15.5)
WBC: 7.7 10*3/uL (ref 4.0–10.5)

## 2017-03-10 LAB — BASIC METABOLIC PANEL
ANION GAP: 7 (ref 5–15)
BUN: 9 mg/dL (ref 6–20)
CO2: 28 mmol/L (ref 22–32)
Calcium: 9.1 mg/dL (ref 8.9–10.3)
Chloride: 107 mmol/L (ref 101–111)
Creatinine, Ser: 0.56 mg/dL (ref 0.44–1.00)
GFR calc Af Amer: >60 mL/min (ref >60)
GFR calc non Af Amer: >60 mL/min (ref >60)
GLUCOSE: 102 mg/dL — AB (ref 65–99)
POTASSIUM: 4 mmol/L (ref 3.5–5.1)
Sodium: 142 mmol/L (ref 135–145)

## 2017-03-10 LAB — SURGICAL PCR SCREEN
MRSA, PCR: NEGATIVE
Staphylococcus aureus: NEGATIVE

## 2017-03-10 LAB — GLUCOSE, CAPILLARY: Glucose-Capillary: 94 mg/dL (ref 65–99)

## 2017-03-10 NOTE — Progress Notes (Signed)
Cardiologist denies  Medical Md is Dr.Sun  Echo report in epic from 2005 and thinks maybe again in past 5 yrs-to request from [facility] test > 5 yrs ago  Heart cath denies  EKG to be requested from [facility] denies in past yr

## 2017-03-10 NOTE — Pre-Procedure Instructions (Signed)
VERLE BRILLHART  03/10/2017      [facility] [place], [place] AT [place] [address] Phone: [phone] Fax: [phone]  [id number] - [place], [place] [address] [place] [address] Phone: [phone] Fax: [phone]    Your procedure is scheduled on Tues, Mar 27 @ 7:30 AM  Report to [facility] Admitting at 5:30 AM  Call this number if you have problems the morning of surgery:  [phone]   Remember:  Do not eat food or drink liquids after midnight.  Take these medicines the morning of surgery with A SIP OF WATER Amlodipine(Norvasc),Synthroid(Levothyroxine),Omeprazole(Prilosec),and Pain Pill(if needed)     How to Manage Your Diabetes Before and After Surgery  Why is it important to control my blood sugar before and after surgery? . Improving blood sugar levels before and after surgery helps healing and can limit problems. . A way of improving blood sugar control is eating a healthy diet by: o  Eating less sugar and carbohydrates o  Increasing activity/exercise o  Talking with your doctor about reaching your blood sugar goals . High blood sugars (greater than 180 mg/dL) can raise your risk of infections and slow your recovery, so you will need to focus on controlling your diabetes during the weeks before surgery. . Make sure that the doctor who takes care of your diabetes knows about your planned surgery including the date and location.  How do I manage my blood sugar before surgery? . Check your blood sugar at least 4 times a day, starting 2 days before surgery, to make sure that the level is not too high or low. o Check your blood sugar the morning of your surgery when you wake up and every 2 hours until you get to the Short Stay unit. . If your blood sugar is less than 70 mg/dL, you will  need to treat for low blood sugar: o Do not take insulin. o Treat a low blood sugar (less than 70 mg/dL) with  cup of clear juice (cranberry or apple), 4 glucose tablets, OR glucose gel. o Recheck blood sugar in 15 minutes after treatment (to make sure it is greater than 70 mg/dL). If your blood sugar is not greater than 70 mg/dL on recheck, call [phone] for further instructions. . Report your blood sugar to the short stay nurse when you get to Short Stay.  . If you are admitted to the hospital after surgery: o Your blood sugar will be checked by the staff and you will probably be given insulin after surgery (instead of oral diabetes medicines) to make sure you have good blood sugar levels. o The goal for blood sugar control after surgery is 80-180 mg/dL.              WHAT DO I DO ABOUT MY DIABETES MEDICATION?   Marland Kitchen Do not take oral diabetes medicines (pills) the morning of surgery.  . THE NIGHT BEFORE SURGERY, take _15___ units of  Lantus___insulin.         . The day of surgery, do not take other diabetes injectables, including Byetta (exenatide), Bydureon (exenatide ER), Victoza (liraglutide), or Trulicity (dulaglutide).  . If your CBG is greater than 220 mg/dL, you may take  of your sliding scale (correction) dose of insulin.    Reviewed and Endorsed  by [facility] Patient Education Committee, August 2015     Do not wear jewelry, make-up or nail polish.  Do not wear lotions, powders,perfumes, or deoderant.  Do not shave 48 hours prior to surgery.    Do not bring valuables to the hospital.  [facility] is not responsible for any belongings or valuables.  Contacts, dentures or bridgework may not be worn into surgery.  Leave your suitcase in the car.  After surgery it may be brought to your room.  For patients admitted to the hospital, discharge time will be determined by your treatment team.  Patients discharged the day of surgery will not be allowed to drive  home.    [facility]  Before surgery, you can play an important role.  Because skin is not sterile, your skin needs to be as free of germs as possible.  You can reduce the number of germs on you skin by washing with CHG (chlorahexidine gluconate) soap before surgery.  CHG is an antiseptic cleaner which kills germs and bonds with the skin to continue killing germs even after washing.  Please DO NOT use if you have an allergy to CHG or antibacterial soaps.  If your skin becomes reddened/irritated stop using the CHG and inform your nurse when you arrive at Short Stay.  Do not shave (including legs and underarms) for at least 48 hours prior to the first CHG shower.  You may shave your face.  Please follow these instructions carefully:   1.  Shower with CHG Soap the night before surgery and the                                morning of Surgery.  2.  If you choose to wash your hair, wash your hair first as usual with your       normal shampoo.  3.  After you shampoo, rinse your hair and body thoroughly to remove the                      Shampoo.  4.  Use CHG as you would any other liquid soap.  You can apply chg directly       to the skin and wash gently with scrungie or a clean washcloth.  5.  Apply the CHG Soap to your body ONLY FROM THE NECK DOWN.        Do not use on open wounds or open sores.  Avoid contact with your eyes,       ears, mouth and genitals (private parts).  Wash genitals (private parts)       with your normal soap.  6.  Wash thoroughly, paying special attention to the area where your surgery        will be performed.  7.  Thoroughly rinse your body with warm water from the neck down.  8.  DO NOT shower/wash with your normal soap after using and rinsing off       the CHG Soap.  9.  Pat yourself dry with a clean towel.            10.  Wear clean pajamas.            11.  Place clean sheets on your bed the night of your first shower and do not         sleep with pets.  Day  of Surgery  Do not apply any lotions/deoderants the morning of surgery.  Please wear clean clothes to the hospital/surgery center.    Please read over the following fact sheets that you were given. Pain Booklet, Coughing and Deep Breathing, MRSA Information and Surgical Site Infection Prevention

## 2017-03-11 LAB — HEMOGLOBIN A1C
Hgb A1c MFr Bld: 6 % — ABNORMAL HIGH (ref 4.8–5.6)
Mean Plasma Glucose: 126 mg/dL

## 2017-03-20 MED ORDER — CEFAZOLIN SODIUM-DEXTROSE 2-4 GM/100ML-% IV SOLN
2.0000 g | INTRAVENOUS | Status: AC
  Administered 2017-03-21: 2 g via INTRAVENOUS
  Filled 2017-03-20: qty 100

## 2017-03-21 ENCOUNTER — Inpatient Hospital Stay (HOSPITAL_COMMUNITY): Payer: Medicare HMO | Admitting: Anesthesiology

## 2017-03-21 ENCOUNTER — Inpatient Hospital Stay (HOSPITAL_COMMUNITY)
Admission: RE | Admit: 2017-03-21 | Discharge: 2017-03-22 | DRG: 483 | Disposition: A | Discharge status: Home / Self Care | Payer: Medicare HMO | Source: Ambulatory Visit | Attending: Orthopedic Surgery | Admitting: Orthopedic Surgery

## 2017-03-21 ENCOUNTER — Encounter (HOSPITAL_COMMUNITY): Payer: Self-pay | Admitting: Urology

## 2017-03-21 ENCOUNTER — Encounter (HOSPITAL_COMMUNITY)
Admission: RE | Disposition: A | Discharge status: Home / Self Care | Payer: Self-pay | Source: Ambulatory Visit | Attending: Orthopedic Surgery

## 2017-03-21 ENCOUNTER — Inpatient Hospital Stay (HOSPITAL_COMMUNITY): Payer: Medicare HMO

## 2017-03-21 DIAGNOSIS — Z794 Long term (current) use of insulin: Secondary | ICD-10-CM

## 2017-03-21 DIAGNOSIS — E039 Hypothyroidism, unspecified: Secondary | ICD-10-CM | POA: Diagnosis present

## 2017-03-21 DIAGNOSIS — Z79899 Other long term (current) drug therapy: Secondary | ICD-10-CM

## 2017-03-21 DIAGNOSIS — K219 Gastro-esophageal reflux disease without esophagitis: Secondary | ICD-10-CM | POA: Diagnosis present

## 2017-03-21 DIAGNOSIS — E119 Type 2 diabetes mellitus without complications: Secondary | ICD-10-CM | POA: Diagnosis present

## 2017-03-21 DIAGNOSIS — I10 Essential (primary) hypertension: Secondary | ICD-10-CM | POA: Diagnosis present

## 2017-03-21 DIAGNOSIS — F419 Anxiety disorder, unspecified: Secondary | ICD-10-CM | POA: Diagnosis present

## 2017-03-21 DIAGNOSIS — M19011 Primary osteoarthritis, right shoulder: Principal | ICD-10-CM | POA: Diagnosis present

## 2017-03-21 DIAGNOSIS — Z96612 Presence of left artificial shoulder joint: Secondary | ICD-10-CM | POA: Diagnosis present

## 2017-03-21 DIAGNOSIS — Z87891 Personal history of nicotine dependence: Secondary | ICD-10-CM | POA: Diagnosis not present

## 2017-03-21 DIAGNOSIS — M25511 Pain in right shoulder: Secondary | ICD-10-CM | POA: Diagnosis present

## 2017-03-21 DIAGNOSIS — E785 Hyperlipidemia, unspecified: Secondary | ICD-10-CM | POA: Diagnosis present

## 2017-03-21 DIAGNOSIS — F329 Major depressive disorder, single episode, unspecified: Secondary | ICD-10-CM | POA: Diagnosis present

## 2017-03-21 DIAGNOSIS — Z96653 Presence of artificial knee joint, bilateral: Secondary | ICD-10-CM | POA: Diagnosis present

## 2017-03-21 DIAGNOSIS — Z96619 Presence of unspecified artificial shoulder joint: Secondary | ICD-10-CM

## 2017-03-21 HISTORY — DX: Primary osteoarthritis, right shoulder: M19.011

## 2017-03-21 HISTORY — PX: TOTAL SHOULDER ARTHROPLASTY: SHX126

## 2017-03-21 LAB — GLUCOSE, CAPILLARY
GLUCOSE-CAPILLARY: 223 mg/dL — AB (ref 65–99)
GLUCOSE-CAPILLARY: 170 mg/dL — AB (ref 65–99)
Glucose-Capillary: 135 mg/dL — ABNORMAL HIGH (ref 65–99)
Glucose-Capillary: 140 mg/dL — ABNORMAL HIGH (ref 65–99)

## 2017-03-21 SURGERY — ARTHROPLASTY, SHOULDER, TOTAL
Anesthesia: Regional | Site: Shoulder | Laterality: Right

## 2017-03-21 MED ORDER — OXYCODONE HCL 5 MG PO TABS
ORAL_TABLET | ORAL | Status: AC
  Filled 2017-03-21: qty 2

## 2017-03-21 MED ORDER — BACLOFEN 10 MG PO TABS: 10.0000 mg | ORAL_TABLET | Freq: Three times a day (TID) | ORAL | 0 refills | Status: DC

## 2017-03-21 MED ORDER — POLYETHYLENE GLYCOL 3350 17 G PO PACK: 17.0000 g | PACK | Freq: Every day | ORAL | Status: DC | PRN

## 2017-03-21 MED ORDER — ONDANSETRON HCL 4 MG/2ML IJ SOLN: 4.0000 mg | Freq: Four times a day (QID) | INTRAMUSCULAR | Status: DC | PRN

## 2017-03-21 MED ORDER — HYDROMORPHONE HCL 1 MG/ML IJ SOLN
INTRAMUSCULAR | Status: AC
  Administered 2017-03-21: 0.5 mg via INTRAVENOUS
  Filled 2017-03-21: qty 1

## 2017-03-21 MED ORDER — DIPHENHYDRAMINE HCL 12.5 MG/5ML PO ELIX
ORAL_SOLUTION | ORAL | Status: AC
  Filled 2017-03-21: qty 10

## 2017-03-21 MED ORDER — ATORVASTATIN CALCIUM 20 MG PO TABS
20.0000 mg | ORAL_TABLET | Freq: Every day | ORAL | Status: DC
  Administered 2017-03-22: 20 mg via ORAL
  Filled 2017-03-21: qty 1

## 2017-03-21 MED ORDER — PROPOFOL 10 MG/ML IV BOLUS
INTRAVENOUS | Status: DC | PRN
  Administered 2017-03-21: 150 mg via INTRAVENOUS

## 2017-03-21 MED ORDER — LIDOCAINE HCL (CARDIAC) 20 MG/ML IV SOLN
INTRAVENOUS | Status: DC | PRN
  Administered 2017-03-21: 100 mg via INTRAVENOUS

## 2017-03-21 MED ORDER — SUGAMMADEX SODIUM 200 MG/2ML IV SOLN
INTRAVENOUS | Status: DC | PRN
  Administered 2017-03-21: 170 mg via INTRAVENOUS

## 2017-03-21 MED ORDER — METOCLOPRAMIDE HCL 5 MG PO TABS: 5.0000 mg | ORAL_TABLET | Freq: Three times a day (TID) | ORAL | Status: DC | PRN

## 2017-03-21 MED ORDER — HYDROMORPHONE HCL 1 MG/ML IJ SOLN
INTRAMUSCULAR | Status: AC
  Administered 2017-03-21: 0.5 mg via INTRAVENOUS
  Filled 2017-03-21: qty 0.5

## 2017-03-21 MED ORDER — CEFAZOLIN IN D5W 1 GM/50ML IV SOLN
1.0000 g | Freq: Four times a day (QID) | INTRAVENOUS | Status: AC
  Administered 2017-03-21 – 2017-03-22 (×3): 1 g via INTRAVENOUS
  Filled 2017-03-21 (×3): qty 50

## 2017-03-21 MED ORDER — ONDANSETRON HCL 4 MG PO TABS: 4.0000 mg | ORAL_TABLET | Freq: Three times a day (TID) | ORAL | 0 refills | Status: DC | PRN

## 2017-03-21 MED ORDER — INSULIN ASPART 100 UNIT/ML ~~LOC~~ SOLN
0.0000 [IU] | Freq: Three times a day (TID) | SUBCUTANEOUS | Status: DC
  Administered 2017-03-21: 5 [IU] via SUBCUTANEOUS

## 2017-03-21 MED ORDER — AMLODIPINE BESYLATE 10 MG PO TABS
10.0000 mg | ORAL_TABLET | Freq: Every day | ORAL | Status: DC
  Administered 2017-03-22: 10 mg via ORAL
  Filled 2017-03-21: qty 1

## 2017-03-21 MED ORDER — OXYCODONE HCL 5 MG PO TABS
5.0000 mg | ORAL_TABLET | ORAL | Status: DC | PRN
  Administered 2017-03-21 – 2017-03-22 (×4): 15 mg via ORAL
  Filled 2017-03-21 (×4): qty 3

## 2017-03-21 MED ORDER — PROMETHAZINE HCL 25 MG/ML IJ SOLN
6.2500 mg | INTRAMUSCULAR | Status: DC | PRN
  Administered 2017-03-21: 6.25 mg via INTRAVENOUS

## 2017-03-21 MED ORDER — LIRAGLUTIDE 18 MG/3ML ~~LOC~~ SOPN
1.8000 mg | PEN_INJECTOR | Freq: Every day | SUBCUTANEOUS | Status: DC
Start: 2017-03-21 — End: 2017-03-22

## 2017-03-21 MED ORDER — LEVOTHYROXINE SODIUM 75 MCG PO TABS
75.0000 ug | ORAL_TABLET | Freq: Every day | ORAL | Status: DC
  Administered 2017-03-22: 75 ug via ORAL
  Filled 2017-03-21: qty 1

## 2017-03-21 MED ORDER — PROMETHAZINE HCL 25 MG/ML IJ SOLN
INTRAMUSCULAR | Status: AC
  Filled 2017-03-21: qty 1

## 2017-03-21 MED ORDER — ADULT MULTIVITAMIN W/MINERALS CH
1.0000 | ORAL_TABLET | Freq: Every day | ORAL | Status: DC
  Administered 2017-03-22: 1 via ORAL
  Filled 2017-03-21: qty 1

## 2017-03-21 MED ORDER — HYDROMORPHONE HCL 1 MG/ML IJ SOLN
INTRAMUSCULAR | Status: AC
  Filled 2017-03-21: qty 0.5

## 2017-03-21 MED ORDER — FENTANYL CITRATE (PF) 100 MCG/2ML IJ SOLN
INTRAMUSCULAR | Status: AC
  Filled 2017-03-21: qty 2

## 2017-03-21 MED ORDER — MIDAZOLAM HCL 2 MG/2ML IJ SOLN
INTRAMUSCULAR | Status: AC
  Filled 2017-03-21: qty 2

## 2017-03-21 MED ORDER — ACETAMINOPHEN 650 MG RE SUPP: 650.0000 mg | Freq: Four times a day (QID) | RECTAL | Status: DC | PRN

## 2017-03-21 MED ORDER — ROCURONIUM BROMIDE 50 MG/5ML IV SOSY
PREFILLED_SYRINGE | INTRAVENOUS | Status: AC
  Filled 2017-03-21: qty 5

## 2017-03-21 MED ORDER — ACETAMINOPHEN 325 MG PO TABS
650.0000 mg | ORAL_TABLET | Freq: Four times a day (QID) | ORAL | Status: DC | PRN
  Administered 2017-03-21: 650 mg via ORAL

## 2017-03-21 MED ORDER — POTASSIUM CHLORIDE IN NACL 20-0.45 MEQ/L-% IV SOLN
INTRAVENOUS | Status: DC
  Administered 2017-03-21: 75 mL/h via INTRAVENOUS
  Filled 2017-03-21 (×2): qty 1000

## 2017-03-21 MED ORDER — BISACODYL 10 MG RE SUPP: 10.0000 mg | Freq: Every day | RECTAL | Status: DC | PRN

## 2017-03-21 MED ORDER — METHOCARBAMOL 1000 MG/10ML IJ SOLN: 500.0000 mg | Freq: Four times a day (QID) | INTRAVENOUS | Status: DC | PRN

## 2017-03-21 MED ORDER — ONDANSETRON HCL 4 MG/2ML IJ SOLN
INTRAMUSCULAR | Status: DC | PRN
  Administered 2017-03-21: 4 mg via INTRAVENOUS

## 2017-03-21 MED ORDER — BIOTIN 10000 MCG PO TABS: 10000.0000 ug | ORAL_TABLET | Freq: Every day | ORAL | Status: DC

## 2017-03-21 MED ORDER — DOCUSATE SODIUM 100 MG PO CAPS
100.0000 mg | ORAL_CAPSULE | Freq: Two times a day (BID) | ORAL | Status: DC
  Administered 2017-03-21 – 2017-03-22 (×3): 100 mg via ORAL
  Filled 2017-03-21 (×3): qty 1

## 2017-03-21 MED ORDER — OXYCODONE HCL 5 MG PO TABS
5.0000 mg | ORAL_TABLET | ORAL | Status: DC | PRN
  Administered 2017-03-21 (×3): 10 mg via ORAL
  Filled 2017-03-21 (×3): qty 2

## 2017-03-21 MED ORDER — LOSARTAN POTASSIUM 50 MG PO TABS
100.0000 mg | ORAL_TABLET | Freq: Every day | ORAL | Status: DC
  Administered 2017-03-22: 100 mg via ORAL
  Filled 2017-03-21: qty 2

## 2017-03-21 MED ORDER — DIPHENHYDRAMINE HCL 12.5 MG/5ML PO ELIX
12.5000 mg | ORAL_SOLUTION | ORAL | Status: DC | PRN
  Administered 2017-03-21 – 2017-03-22 (×4): 25 mg via ORAL
  Filled 2017-03-21 (×3): qty 10

## 2017-03-21 MED ORDER — METOCLOPRAMIDE HCL 5 MG/ML IJ SOLN: 5.0000 mg | Freq: Three times a day (TID) | INTRAMUSCULAR | Status: DC | PRN

## 2017-03-21 MED ORDER — ROCURONIUM BROMIDE 100 MG/10ML IV SOLN
INTRAVENOUS | Status: DC | PRN
  Administered 2017-03-21: 40 mg via INTRAVENOUS

## 2017-03-21 MED ORDER — PROPOFOL 10 MG/ML IV BOLUS
INTRAVENOUS | Status: AC
  Filled 2017-03-21: qty 20

## 2017-03-21 MED ORDER — MIRTAZAPINE 15 MG PO TABS
30.0000 mg | ORAL_TABLET | Freq: Every day | ORAL | Status: DC
  Administered 2017-03-21: 30 mg via ORAL
  Filled 2017-03-21: qty 2

## 2017-03-21 MED ORDER — HYDROMORPHONE HCL 2 MG/ML IJ SOLN
0.5000 mg | INTRAMUSCULAR | Status: DC | PRN
  Administered 2017-03-21 – 2017-03-22 (×5): 0.5 mg via INTRAVENOUS
  Filled 2017-03-21 (×6): qty 1

## 2017-03-21 MED ORDER — OXYCODONE HCL ER 10 MG PO T12A
10.0000 mg | EXTENDED_RELEASE_TABLET | Freq: Two times a day (BID) | ORAL | Status: DC
  Administered 2017-03-21: 10 mg via ORAL
  Filled 2017-03-21: qty 1

## 2017-03-21 MED ORDER — ONDANSETRON HCL 4 MG/2ML IJ SOLN
INTRAMUSCULAR | Status: AC
  Filled 2017-03-21: qty 2

## 2017-03-21 MED ORDER — HYDROMORPHONE HCL 1 MG/ML IJ SOLN
0.2500 mg | INTRAMUSCULAR | Status: DC | PRN
  Administered 2017-03-21 (×3): 0.5 mg via INTRAVENOUS

## 2017-03-21 MED ORDER — PHENOL 1.4 % MT LIQD: 1.0000 | OROMUCOSAL | Status: DC | PRN

## 2017-03-21 MED ORDER — OXYCODONE-ACETAMINOPHEN 10-325 MG PO TABS: 1.0000 | ORAL_TABLET | Freq: Four times a day (QID) | ORAL | 0 refills | Status: DC | PRN

## 2017-03-21 MED ORDER — OXYCODONE HCL 5 MG PO TABS
5.0000 mg | ORAL_TABLET | Freq: Once | ORAL | Status: AC
  Administered 2017-03-21: 5 mg via ORAL
  Filled 2017-03-21: qty 1

## 2017-03-21 MED ORDER — METHOCARBAMOL 500 MG PO TABS
500.0000 mg | ORAL_TABLET | Freq: Four times a day (QID) | ORAL | Status: DC | PRN
  Administered 2017-03-21 – 2017-03-22 (×3): 500 mg via ORAL
  Filled 2017-03-21 (×2): qty 1

## 2017-03-21 MED ORDER — 0.9 % SODIUM CHLORIDE (POUR BTL) OPTIME
TOPICAL | Status: DC | PRN
  Administered 2017-03-21: 1000 mL

## 2017-03-21 MED ORDER — ONDANSETRON HCL 4 MG PO TABS: 4.0000 mg | ORAL_TABLET | Freq: Four times a day (QID) | ORAL | Status: DC | PRN

## 2017-03-21 MED ORDER — HM MULTIVITAMIN ADULT GUMMY PO CHEW: CHEWABLE_TABLET | Freq: Every day | ORAL | Status: DC

## 2017-03-21 MED ORDER — MIDAZOLAM HCL 2 MG/2ML IJ SOLN
INTRAMUSCULAR | Status: DC | PRN
  Administered 2017-03-21: 2 mg via INTRAVENOUS

## 2017-03-21 MED ORDER — SENNA 8.6 MG PO TABS
1.0000 | ORAL_TABLET | Freq: Two times a day (BID) | ORAL | Status: DC
  Administered 2017-03-21 – 2017-03-22 (×3): 8.6 mg via ORAL
  Filled 2017-03-21 (×3): qty 1

## 2017-03-21 MED ORDER — SODIUM CHLORIDE 0.9 % IR SOLN
Status: DC | PRN
  Administered 2017-03-21: 3000 mL

## 2017-03-21 MED ORDER — INSULIN GLARGINE 100 UNIT/ML ~~LOC~~ SOLN
40.0000 [IU] | Freq: Every day | SUBCUTANEOUS | Status: DC
Start: 2017-03-21 — End: 2017-03-22
  Administered 2017-03-21: 40 [IU] via SUBCUTANEOUS
  Filled 2017-03-21: qty 0.4

## 2017-03-21 MED ORDER — HYDROMORPHONE HCL 1 MG/ML IJ SOLN
0.5000 mg | Freq: Once | INTRAMUSCULAR | Status: DC
  Administered 2017-03-21: 0.5 mg via INTRAVENOUS

## 2017-03-21 MED ORDER — MAGNESIUM CITRATE PO SOLN: 1.0000 | Freq: Once | ORAL | Status: DC | PRN

## 2017-03-21 MED ORDER — PANTOPRAZOLE SODIUM 40 MG PO TBEC
80.0000 mg | DELAYED_RELEASE_TABLET | Freq: Every day | ORAL | Status: DC
  Administered 2017-03-22: 80 mg via ORAL
  Filled 2017-03-21: qty 2

## 2017-03-21 MED ORDER — OXYCODONE HCL 5 MG PO TABS: 5.0000 mg | ORAL_TABLET | ORAL | Status: DC | PRN

## 2017-03-21 MED ORDER — SENNA-DOCUSATE SODIUM 8.6-50 MG PO TABS: 2.0000 | ORAL_TABLET | Freq: Every day | ORAL | 1 refills | Status: DC

## 2017-03-21 MED ORDER — ACETAMINOPHEN 325 MG PO TABS
ORAL_TABLET | ORAL | Status: AC
  Filled 2017-03-21: qty 2

## 2017-03-21 MED ORDER — PHENYLEPHRINE HCL 10 MG/ML IJ SOLN
INTRAMUSCULAR | Status: DC | PRN
  Administered 2017-03-21: 20 ug/min via INTRAVENOUS

## 2017-03-21 MED ORDER — METHOCARBAMOL 500 MG PO TABS
ORAL_TABLET | ORAL | Status: AC
  Filled 2017-03-21: qty 1

## 2017-03-21 MED ORDER — HYDROMORPHONE HCL 1 MG/ML IJ SOLN
0.5000 mg | INTRAMUSCULAR | Status: DC | PRN
  Administered 2017-03-21: 0.5 mg via INTRAVENOUS

## 2017-03-21 MED ORDER — ALUM & MAG HYDROXIDE-SIMETH 200-200-20 MG/5ML PO SUSP: 30.0000 mL | ORAL | Status: DC | PRN

## 2017-03-21 MED ORDER — MEPERIDINE HCL 25 MG/ML IJ SOLN: 6.2500 mg | INTRAMUSCULAR | Status: DC | PRN

## 2017-03-21 MED ORDER — LIDOCAINE 2% (20 MG/ML) 5 ML SYRINGE
INTRAMUSCULAR | Status: AC
  Filled 2017-03-21: qty 5

## 2017-03-21 MED ORDER — FENTANYL CITRATE (PF) 100 MCG/2ML IJ SOLN
INTRAMUSCULAR | Status: DC | PRN
  Administered 2017-03-21 (×2): 50 ug via INTRAVENOUS

## 2017-03-21 MED ORDER — LACTATED RINGERS IV SOLN
INTRAVENOUS | Status: DC | PRN
  Administered 2017-03-21 (×2): via INTRAVENOUS

## 2017-03-21 MED ORDER — ZOLPIDEM TARTRATE 5 MG PO TABS
5.0000 mg | ORAL_TABLET | Freq: Every evening | ORAL | Status: DC | PRN
  Administered 2017-03-21: 5 mg via ORAL
  Filled 2017-03-21: qty 1

## 2017-03-21 MED ORDER — MENTHOL 3 MG MT LOZG: 1.0000 | LOZENGE | OROMUCOSAL | Status: DC | PRN

## 2017-03-21 SURGICAL SUPPLY — 51 items
BLADE SAW SGTL MED 73X18.5 STR (BLADE) ×3 IMPLANT
CAPT SHLDR TOTAL 2 ×2 IMPLANT
CEMENT HV SMART SET (Cement) ×2 IMPLANT
CLOSURE STERI-STRIP 1/2X4 (GAUZE/BANDAGES/DRESSINGS) ×1
CLSR STERI-STRIP ANTIMIC 1/2X4 (GAUZE/BANDAGES/DRESSINGS) ×2 IMPLANT
COVER SURGICAL LIGHT HANDLE (MISCELLANEOUS) ×3 IMPLANT
DRAPE ORTHO SPLIT 77X108 STRL (DRAPES) ×6
DRAPE PROXIMA HALF (DRAPES) ×3 IMPLANT
DRAPE SURG ORHT 6 SPLT 77X108 (DRAPES) ×2 IMPLANT
DRAPE U-SHAPE 47X51 STRL (DRAPES) ×3 IMPLANT
DRSG MEPILEX BORDER 4X8 (GAUZE/BANDAGES/DRESSINGS) ×3 IMPLANT
DURAPREP 26ML APPLICATOR (WOUND CARE) ×3 IMPLANT
ELECT REM PT RETURN 9FT ADLT (ELECTROSURGICAL) ×3
ELECTRODE REM PT RTRN 9FT ADLT (ELECTROSURGICAL) ×1 IMPLANT
GLOVE BIOGEL PI ORTHO PRO SZ8 (GLOVE) ×4
GLOVE ORTHO TXT STRL SZ7.5 (GLOVE) ×3 IMPLANT
GLOVE PI ORTHO PRO STRL SZ8 (GLOVE) ×2 IMPLANT
GLOVE SURG ORTHO 8.0 STRL STRW (GLOVE) ×3 IMPLANT
GOWN STRL REUS W/ TWL XL LVL3 (GOWN DISPOSABLE) ×1 IMPLANT
GOWN STRL REUS W/TWL 2XL LVL3 (GOWN DISPOSABLE) ×3 IMPLANT
GOWN STRL REUS W/TWL XL LVL3 (GOWN DISPOSABLE) ×3
HANDPIECE INTERPULSE COAX TIP (DISPOSABLE) ×3
HOOD PEEL AWAY FACE SHEILD DIS (HOOD) ×6 IMPLANT
KIT BASIN OR (CUSTOM PROCEDURE TRAY) ×3 IMPLANT
KIT ROOM TURNOVER OR (KITS) ×3 IMPLANT
MANIFOLD NEPTUNE II (INSTRUMENTS) ×3 IMPLANT
NS IRRIG 1000ML POUR BTL (IV SOLUTION) ×3 IMPLANT
PACK SHOULDER (CUSTOM PROCEDURE TRAY) ×3 IMPLANT
PAD ARMBOARD 7.5X6 YLW CONV (MISCELLANEOUS) ×6 IMPLANT
SET HNDPC FAN SPRY TIP SCT (DISPOSABLE) ×1 IMPLANT
SLING ARM IMMOBILIZER LRG (SOFTGOODS) IMPLANT
SLING ARM IMMOBILIZER MED (SOFTGOODS) ×2 IMPLANT
SMARTMIX MINI TOWER (MISCELLANEOUS)
SPONGE LAP 18X18 X RAY DECT (DISPOSABLE) ×2 IMPLANT
SUCTION FRAZIER HANDLE 10FR (MISCELLANEOUS) ×2
SUCTION TUBE FRAZIER 10FR DISP (MISCELLANEOUS) ×1 IMPLANT
SUPPORT WRAP ARM LG (MISCELLANEOUS) ×3 IMPLANT
SUT FIBERWIRE #2 38 REV NDL BL (SUTURE) ×18
SUT MNCRL AB 4-0 PS2 18 (SUTURE) IMPLANT
SUT VIC AB 0 CT1 27 (SUTURE) ×3
SUT VIC AB 0 CT1 27XBRD ANBCTR (SUTURE) ×1 IMPLANT
SUT VIC AB 2-0 CT1 27 (SUTURE) ×3
SUT VIC AB 2-0 CT1 TAPERPNT 27 (SUTURE) ×1 IMPLANT
SUT VIC AB 3-0 SH 8-18 (SUTURE) ×3 IMPLANT
SUTURE FIBERWR#2 38 REV NDL BL (SUTURE) ×6 IMPLANT
TOWEL OR 17X24 6PK STRL BLUE (TOWEL DISPOSABLE) ×3 IMPLANT
TOWEL OR 17X26 10 PK STRL BLUE (TOWEL DISPOSABLE) ×3 IMPLANT
TOWER SMARTMIX MINI (MISCELLANEOUS) IMPLANT
TUBE CONNECTING 12'X1/4 (SUCTIONS)
TUBE CONNECTING 12X1/4 (SUCTIONS) IMPLANT
YANKAUER SUCT BULB TIP NO VENT (SUCTIONS) ×3 IMPLANT

## 2017-03-21 NOTE — Anesthesia Preprocedure Evaluation (Addendum)
Anesthesia Evaluation  Patient identified by MRN, date of birth, ID band Patient awake    Reviewed: Allergy & Precautions, H&P , NPO status , Patient's Chart, lab work & pertinent test results  Airway Mallampati: II  TM Distance: >3 FB Neck ROM: Full    Dental no notable dental hx. (+) Teeth Intact, Dental Advisory Given, Chipped, Missing   Pulmonary neg pulmonary ROS, former smoker,    Pulmonary exam normal breath sounds clear to auscultation       Cardiovascular hypertension, Pt. on medications  Rhythm:Regular Rate:Normal     Neuro/Psych Anxiety Depression negative neurological ROS     GI/Hepatic Neg liver ROS, GERD  Medicated and Controlled,  Endo/Other  diabetes, Type 1, Insulin DependentHypothyroidism Morbid obesity  Renal/GU negative Renal ROS  negative genitourinary   Musculoskeletal  (+) Arthritis , Osteoarthritis,    Abdominal   Peds  Hematology negative hematology ROS (+)   Anesthesia Other Findings Upper left incisor chipped.  Nothing loose.  Reproductive/Obstetrics negative OB ROS                            Anesthesia Physical  Anesthesia Plan  ASA: III  Anesthesia Plan: General and Regional   Post-op Pain Management: GA combined w/ Regional for post-op pain   Induction: Intravenous  Airway Management Planned: Oral ETT  Additional Equipment:   Intra-op Plan:   Post-operative Plan: Extubation in OR  Informed Consent: I have reviewed the patients History and Physical, chart, labs and discussed the procedure including the risks, benefits and alternatives for the proposed anesthesia with the patient or authorized representative who has indicated his/her understanding and acceptance.   Dental advisory given  Plan Discussed with: CRNA  Anesthesia Plan Comments:         Anesthesia Quick Evaluation

## 2017-03-21 NOTE — Op Note (Signed)
03/21/2017  9:50 AM  PATIENT:  Morgan Taylor    PRE-OPERATIVE DIAGNOSIS:  Right shoulder primary localized osteoarthritis  POST-OPERATIVE DIAGNOSIS:  Same  PROCEDURE:  Right TOTAL SHOULDER ARTHROPLASTY  SURGEON:  Johnny Bridge, MD  PHYSICIAN ASSISTANT: Joya Gaskins, OPA-C, present and scrubbed throughout the case, critical for completion in a timely fashion, and for retraction, instrumentation, and closure.  ANESTHESIA:   General  ESTIMATED BLOOD LOSS: 250 mL  PREOPERATIVE INDICATIONS:  Morgan Taylor is a  56 y.o. female with a diagnosis of OA RT SHOULDER who failed conservative measures and elected for surgical management.  She's had a previous contralateral total shoulder replacement with excellent results and elected for the same operation.  The risks benefits and alternatives were discussed with the patient preoperatively including but not limited to the risks of infection, bleeding, nerve injury, cardiopulmonary complications, the need for revision surgery, dislocation, loosening, incomplete relief of pain, among others, and the patient was willing to proceed.   OPERATIVE IMPLANTS: Biomet size 11 micro- press-fit humeral stem, size 42+18 Versa-dial humeral head, set in the D position with increased coverage posteriorly, with a small cemented glenoid polyethylene 3 peg implant with a central regenerex noncemented post.   OPERATIVE FINDINGS: Advanced glenohumeral osteoarthritis involving the glenoid and the humeral head with substantial osteophyte formation inferiorly. There was still some cartilage left on the glenoid face. I had containment of all 3 peripheral and the central hole on the glenoid with excellent fixation. She had substantial stiffness passively prior to surgical intervention, only about 115 of forward flexion and external rotation to 0.   OPERATIVE PROCEDURE: The patient was brought to the operating room and placed in the supine position. General anesthesia was  administered. IV antibiotics were given.  The upper extremity was prepped and draped in usual sterile fashion. The patient was in a beachchair position with all bony prominences padded.   Time out was performed and a deltopectoral approach was carried out. The biceps tendon was tenodesed to the pectoralis tendon. The subscapularis was released, tagging it with a #2 FiberWire, leaving a cuff of tendon for repair.   The inferior osteophyte was removed, and release of the capsule off of the humeral side was completed. The head was dislocated, and I reamed sequentially. I placed the humeral cutting guide at 30 of retroversion, and then pinned this into place, and made my humeral neck cut. This was at the appropriate level.   I then placed deep retractors and exposed the glenoid. I excised the labrum circumferentially, taking care to protect the axillary nerve inferiorly.   I then placed a guidewire into the center position, controlling appropriate version and inclination. I then reamed over the guidewire with the small reamer, and was satisfied with the preparation. I preserved the subchondral bone in order to maximize the strength and minimize the risk for subsequent subsidence.   I then drilled the central hole for the regenerex peg, and then placed the guide, and then drilled the 3 peripheral peg holes. I had excellent bony circumferential contact.   I then cleaned the glenoid, irrigated it copiously, and then dried it and cemented the prosthesis into place. Excellent seating was achieved. I had full exposure. The cement cured, while I turned my attention to the humeral side.   I sequentially broached, up to the selected size, with the broach set at 30 of retroversion. I then placed the real stem. I trialed with multiple heads, and the above-named component was selected. Increased  posterior coverage improved the coverage. The soft tissue tension was appropriate. I placed a total of 3 #2 FiberWire  through the bone prior to placement of the stem.  I then impacted the real humeral head into place, reduced the head, and irrigated copiously. Excellent stability and range of motion was achieved. I repaired the subscapularis with a total of 5  #2 FiberWire, including the rotator interval, and irrigated copiously once more. The subcutaneous tissue was closed with Vicryl including the deltopectoral fascia.   The skin was closed with Steri-Strips and sterile gauze was applied. She had a preoperative nerve block. She tolerated the procedure well and there were no complications.

## 2017-03-21 NOTE — Transfer of Care (Signed)
Immediate Anesthesia Transfer of Care Note  Patient: Morgan Taylor  Procedure(s) Performed: Procedure(s): TOTAL SHOULDER ARTHROPLASTY (Right)  Patient Location: PACU  Anesthesia Type:General and Regional  Level of Consciousness: awake, alert  and oriented  Airway & Oxygen Therapy: Patient Spontanous Breathing and Patient connected to nasal cannula oxygen  Post-op Assessment: Report given to RN  Post vital signs: Reviewed and stable  Last Vitals:  Vitals:   03/21/17 0719 03/21/17 0720  BP:    Pulse: 84 88  Resp: 15 (!) 22  Temp:      Last Pain:  Vitals:   03/21/17 0609  TempSrc: Oral  PainSc:          Complications: No apparent anesthesia complications

## 2017-03-21 NOTE — Anesthesia Procedure Notes (Signed)
Procedure Name: Intubation Date/Time: 03/21/2017 7:42 AM Performed by: Nolon Nations Pre-anesthesia Checklist: Patient identified, Emergency Drugs available, Suction available and Patient being monitored Patient Re-evaluated:Patient Re-evaluated prior to inductionOxygen Delivery Method: Circle System Utilized Preoxygenation: Pre-oxygenation with 100% oxygen Intubation Type: IV induction Ventilation: Mask ventilation without difficulty Laryngoscope Size: Mac and 3 Grade View: Grade I Tube type: Oral Tube size: 7.0 mm Number of attempts: 1 Airway Equipment and Method: Stylet Placement Confirmation: ETT inserted through vocal cords under direct vision,  positive ETCO2 and breath sounds checked- equal and bilateral Secured at: 21 cm Tube secured with: Tape Dental Injury: Teeth and Oropharynx as per pre-operative assessment

## 2017-03-21 NOTE — Anesthesia Postprocedure Evaluation (Addendum)
Anesthesia Post Note  Patient: Morgan Taylor  Procedure(s) Performed: Procedure(s) (LRB): TOTAL SHOULDER ARTHROPLASTY (Right)  Patient location during evaluation: PACU Anesthesia Type: Regional and General Level of consciousness: awake and alert Pain management: pain level controlled Vital Signs Assessment: post-procedure vital signs reviewed and stable Respiratory status: spontaneous breathing Cardiovascular status: stable Anesthetic complications: no       Last Vitals:  Vitals:   03/21/17 1130 03/21/17 1145  BP: 108/66 115/79  Pulse: 71 78  Resp: 14 17  Temp:      Last Pain:  Vitals:   03/21/17 0609  TempSrc: Oral  PainSc:                  Nolon Nations

## 2017-03-21 NOTE — Discharge Instructions (Signed)

## 2017-03-21 NOTE — H&P (Signed)
PREOPERATIVE H&P  Chief Complaint: OA RT SHOULDER  HPI: Morgan Taylor is a 56 y.o. female who presents for preoperative history and physical with a diagnosis of OA RT SHOULDER. Symptoms are rated as moderate to severe, and have been worsening.  This is significantly impairing activities of daily living.  She has elected for surgical management.   She has failed injections, activity modification, anti-inflammatories, and assistive devices.  Preoperative X-rays demonstrate end stage degenerative changes with osteophyte formation, loss of joint space, subchondral sclerosis.  She has had an excellent result from the same operation on the other side.   Past Medical History:  Diagnosis Date  . Anxiety   . Arthritis   . Cancer [facility]) 2009   lumpectomy  . Depression    takes Remeron nightly  . Diabetes mellitus    takes Lantus and Victoza. Average fasting blood sugar runs 89  . GERD (gastroesophageal reflux disease)    takes Omeprazole daily  . Hyperlipemia    takes Lipitor daily  . Hypertension    takes Amlodipine and Losartan daily  . Hypothyroidism    takes Synthroid daily  . Neck pain   . Numbness    in both hands   Past Surgical History:  Procedure Laterality Date  . APPENDECTOMY    . BREAST LUMPECTOMY Left   . CESAREAN SECTION    . COLONOSCOPY    . DILATION AND CURETTAGE OF UTERUS    . SHOULDER ARTHROSCOPY Left    cleaned out joint  . TOTAL KNEE ARTHROPLASTY Right 08/15/2014   Procedure: RIGHT TOTAL KNEE ARTHROPLASTY;  Surgeon: Yvette Rack., MD;  Location: [facility];  Service: Orthopedics;  Laterality: Right;  . TOTAL KNEE ARTHROPLASTY Left 01/23/2015   Procedure: TOTAL KNEE ARTHROPLASTY;  Surgeon: Yvette Rack., MD;  Location: [facility];  Service: Orthopedics;  Laterality: Left;  . TOTAL SHOULDER ARTHROPLASTY Left 02/19/2016   Procedure: LEFT TOTAL SHOULDER ARTHROPLASTY;  Surgeon: Marchia Bond, MD;  Location: [facility];  Service: Orthopedics;  Laterality: Left;   Social History    Social History  . Marital status: Divorced    Spouse name: N/A  . Number of children: 5  . Years of education: N/A   Social History Main Topics  . Smoking status: Former Research scientist (life sciences)  . Smokeless tobacco: Never Used     Comment: quit smoking 14 yrs ago  . Alcohol use No  . Drug use: No  . Sexual activity: Not Currently   Other Topics Concern  . None   Social History Narrative  . None   Family History  Problem Relation Age of Onset  . Cancer Paternal Grandmother     NOS  . Pancreatic cancer Paternal Aunt   . Cancer Paternal Aunt     NOS  . Breast cancer Mother 59    Stage 3  . Colon cancer Neg Hx   . Rectal cancer Neg Hx   . Stomach cancer Neg Hx    Allergies  Allergen Reactions  . Lyrica [Pregabalin] Swelling    Hands, legs, feet  . Sulfonamide Derivatives Hives   Prior to Admission medications   Medication Sig Start Date End Date Taking? Authorizing Provider  amLODipine (NORVASC) 10 MG tablet Take 10 mg by mouth daily.    Yes Historical Provider, MD  Aspirin-Acetaminophen-Caffeine (GOODY HEADACHE PO) Take 1 packet by mouth daily as needed (headache).   Yes Historical Provider, MD  atorvastatin (LIPITOR) 20 MG tablet Take 20 mg by mouth daily.  Yes Historical Provider, MD  Biotin 10000 MCG TABS Take 10,000 mcg by mouth at bedtime.   Yes Historical Provider, MD  insulin glargine (LANTUS) 100 UNIT/ML injection Inject 40 Units into the skin at bedtime.    Yes Historical Provider, MD  levothyroxine (SYNTHROID, LEVOTHROID) 75 MCG tablet Take 75 mcg by mouth daily before breakfast.  01/05/16  Yes Historical Provider, MD  Liraglutide (VICTOZA) 18 MG/3ML SOPN Inject 1.8 mg into the skin daily.   Yes Historical Provider, MD  losartan (COZAAR) 100 MG tablet Take 100 mg by mouth daily.    Yes Historical Provider, MD  mirtazapine (REMERON) 30 MG tablet Take 30 mg by mouth at bedtime. 08/29/16  Yes Historical Provider, MD  Multiple Vitamins-Minerals (HM MULTIVITAMIN ADULT GUMMY PO)  Take 2 tablets by mouth at bedtime.   Yes Historical Provider, MD  omeprazole (PRILOSEC) 20 MG capsule Take 20 mg by mouth daily.    Yes Historical Provider, MD  oxyCODONE-acetaminophen (PERCOCET) 5-325 MG tablet Take 1 tablet by mouth every 8 (eight) hours as needed for severe pain.    Yes Historical Provider, MD  baclofen (LIORESAL) 10 MG tablet Take 1 tablet (10 mg total) by mouth 3 (three) times daily. As needed for muscle spasm Patient not taking: Reported on 03/07/2017 02/19/16   Marchia Bond, MD  ondansetron (ZOFRAN) 4 MG tablet Take 1 tablet (4 mg total) by mouth every 8 (eight) hours as needed for nausea or vomiting. Patient not taking: Reported on 03/07/2017 02/19/16   Marchia Bond, MD     Positive ROS: All other systems have been reviewed and were otherwise negative with the exception of those mentioned in the HPI and as above.  Physical Exam: General: Alert, no acute distress Cardiovascular: No pedal edema Respiratory: No cyanosis, no use of accessory musculature GI: No organomegaly, abdomen is soft and non-tender Skin: No lesions in the area of chief complaint Neurologic: Sensation intact distally Psychiatric: Patient is competent for consent with normal mood and affect Lymphatic: No axillary or cervical lymphadenopathy  MUSCULOSKELETAL: right shoulder AROM 9-110, with crepitance and pain, ER to neutral, cuff limited by pain.    Assessment: OSTEOARTHRITIS PRIMARY LOCALIZED RIGHT SHOULDER   Plan: Plan for Procedure(s): TOTAL SHOULDER ARTHROPLASTY  The risks benefits and alternatives were discussed with the patient including but not limited to the risks of nonoperative treatment, versus surgical intervention including infection, bleeding, nerve injury,  blood clots, cardiopulmonary complications, morbidity, mortality, among others, and they were willing to proceed.   Johnny Bridge, MD Cell [phone]   03/21/2017 7:19 AM

## 2017-03-22 ENCOUNTER — Encounter (HOSPITAL_COMMUNITY): Payer: Self-pay | Admitting: Orthopedic Surgery

## 2017-03-22 LAB — GLUCOSE, CAPILLARY
GLUCOSE-CAPILLARY: 175 mg/dL — AB (ref 65–99)
Glucose-Capillary: 295 mg/dL — ABNORMAL HIGH (ref 65–99)

## 2017-03-22 MED ORDER — OXYCODONE HCL ER 10 MG PO T12A: 10.0000 mg | EXTENDED_RELEASE_TABLET | Freq: Two times a day (BID) | ORAL | 0 refills | Status: DC

## 2017-03-22 MED ORDER — OXYCODONE HCL ER 10 MG PO T12A
10.0000 mg | EXTENDED_RELEASE_TABLET | Freq: Two times a day (BID) | ORAL | Status: DC
  Administered 2017-03-22: 10 mg via ORAL
  Filled 2017-03-22: qty 1

## 2017-03-22 NOTE — Evaluation (Signed)
Occupational Therapy Evaluation Patient Details Name: Morgan Taylor MRN: [id number] DOB: 28-Mar-1961 Today's Date: 03/22/2017    History of Present Illness 56 yo female s/p R TSA reverse   Clinical Impression   Patient evaluated by Occupational Therapy with no further acute OT needs identified. All education has been completed and the patient has no further questions. See below for any follow-up Occupational Therapy or equipment needs. OT to sign off. Thank you for referral.      Follow Up Recommendations  No OT follow up    Equipment Recommendations  None recommended by OT    Recommendations for Other Services       Precautions / Restrictions Precautions Precautions: Shoulder Type of Shoulder Precautions: conservative Shoulder Interventions: Off for dressing/bathing/exercises Required Braces or Orthoses: Sling Restrictions Weight Bearing Restrictions: Yes      Mobility Bed Mobility Overal bed mobility: Needs Assistance Bed Mobility: Supine to Sit     Supine to sit: Mod assist     General bed mobility comments: elevated HOB  Transfers Overall transfer level: Modified independent                    Balance                                           ADL either performed or assessed with clinical judgement   ADL Overall ADL's : Modified independent                                       General ADL Comments: pt able to demo washing bil UE , don doff sling previous L shoulder   Pt educated on bathing and avoid washing directly on incision. Pt educated to use new wash cloth and towel each day. Pt educated to allow water to run across dressing and not to soak in a tub at this time.     Vision Baseline Vision/History: Wears glasses Wears Glasses: Reading only       Perception     Praxis      Pertinent Vitals/Pain Pain Assessment: Faces Faces Pain Scale: Hurts little more Pain Location: R shoulder Pain  Intervention(s): Monitored during session;Premedicated before session;Repositioned     Hand Dominance Right   Extremity/Trunk Assessment Upper Extremity Assessment Upper Extremity Assessment: RUE deficits/detail RUE Deficits / Details: s/p surg   Lower Extremity Assessment Lower Extremity Assessment: Overall WFL for tasks assessed   Cervical / Trunk Assessment Cervical / Trunk Assessment: Normal   Communication Communication Communication: No difficulties   Cognition Arousal/Alertness: Awake/alert Behavior During Therapy: WFL for tasks assessed/performed Overall Cognitive Status: Within Functional Limits for tasks assessed                                     General Comments  dressing dry and intact    Exercises Exercises: Shoulder Shoulder Exercises Elbow Flexion: AROM;Right;10 reps;Seated Wrist Flexion: AROM;Right;10 reps Digit Composite Flexion: AROM;Right;10 reps Donning/doffing shirt without moving shoulder: Independent Method for sponge bathing under operated UE: Independent Donning/doffing sling/immobilizer: Independent Correct positioning of sling/immobilizer: Independent ROM for elbow, wrist and digits of operated UE: Independent Sling wearing schedule (on at all times/off for ADL's): Independent Positioning of UE while sleeping:  Independent   Shoulder Instructions Shoulder Instructions Donning/doffing shirt without moving shoulder: Independent Method for sponge bathing under operated UE: Independent Donning/doffing sling/immobilizer: Independent Correct positioning of sling/immobilizer: Independent ROM for elbow, wrist and digits of operated UE: Independent Sling wearing schedule (on at all times/off for ADL's): Independent Positioning of UE while sleeping: [place] expects to be discharged to:: Private residence Living Arrangements: Children Available Help at Discharge: Family;Available 24 hours/day Type  of Home: House Home Access: Stairs to enter [employer] of Steps: 4-5 Entrance Stairs-Rails: Left Home Layout: One level     Bathroom Shower/Tub: Tub/shower unit;Door   [employer] Toilet: Standard     Home Equipment: Museum/gallery conservator - 2 wheels          Prior Functioning/Environment Level of Independence: Independent                 OT Problem List:        OT Treatment/Interventions:      OT Goals(Current goals can be found in the care plan section)    OT Frequency:     Barriers to D/C:            Co-evaluation              End of Session Nurse Communication: Mobility status;Precautions;Weight bearing status  Activity Tolerance: Patient tolerated treatment well Patient left: in chair;with call bell/phone within reach  OT Visit Diagnosis: Unsteadiness on feet (R26.81)                Time: 0973-5329 OT Time Calculation (min): 24 min Charges:  OT General Charges $OT Visit: 1 Procedure OT Evaluation $OT Eval Moderate Complexity: 1 Procedure G-Codes:      Jeri Modena   OTR/L Pager: [phone] Office: [phone] .   Parke Poisson B 03/22/2017, 11:58 AM

## 2017-03-22 NOTE — Progress Notes (Signed)
Paged PA for uncontrolled pain and if lab draws are okay on LUE s/p lumpectomy on 2009. Awaiting response

## 2017-03-22 NOTE — Progress Notes (Signed)
Paged ortho for pain meds. Awaiting response

## 2017-03-22 NOTE — Discharge Summary (Signed)
Physician Discharge Summary  Patient ID: Morgan Taylor MRN: [id number] DOB/AGE: September 01, 1961 56 y.o.  Admit date: 03/21/2017 Discharge date: 03/22/2017  Admission Diagnoses:  Primary localized osteoarthrosis of right shoulder  Discharge Diagnoses:  Principal Problem:   Primary localized osteoarthrosis of right shoulder Active Problems:   S/P shoulder replacement   Past Medical History:  Diagnosis Date  . Anxiety   . Arthritis   . Cancer [facility]) 2009   lumpectomy  . Depression    takes Remeron nightly  . Diabetes mellitus    takes Lantus and Victoza. Average fasting blood sugar runs 89  . GERD (gastroesophageal reflux disease)    takes Omeprazole daily  . Hyperlipemia    takes Lipitor daily  . Hypertension    takes Amlodipine and Losartan daily  . Hypothyroidism    takes Synthroid daily  . Neck pain   . Numbness    in both hands  . Primary localized osteoarthrosis of right shoulder 03/21/2017    Surgeries: Procedure(s): TOTAL SHOULDER ARTHROPLASTY on 03/21/2017   Consultants (if any):   Discharged Condition: Improved  Hospital Course: Morgan Taylor is an 56 y.o. female who was admitted 03/21/2017 with a diagnosis of Primary localized osteoarthrosis of right shoulder and went to the operating room on 03/21/2017 and underwent the above named procedures.    She was given perioperative antibiotics:  Anti-infectives    Start     Dose/Rate Route Frequency Ordered Stop   03/21/17 1330  ceFAZolin (ANCEF) IVPB 1 g/50 mL premix     1 g 100 mL/hr over 30 Minutes Intravenous Every 6 hours 03/21/17 1137 03/22/17 0243   03/21/17 0700  ceFAZolin (ANCEF) IVPB 2g/100 mL premix     2 g 200 mL/hr over 30 Minutes Intravenous To [facility] 03/20/17 1217 03/21/17 0743    .  She was given sequential compression devices, early ambulation,  for DVT prophylaxis.  She benefited maximally from the hospital stay and there were no complications.    Recent vital signs:  Vitals:    03/22/17 0028 03/22/17 0636  BP: (!) 150/67 (!) 152/64  Pulse: (!) 103 92  Resp:  17  Temp: 98.2 F (36.8 C) 98.1 F (36.7 C)    Recent laboratory studies:  Lab Results  Component Value Date   HGB 13.6 03/10/2017   HGB 11.6 (L) 02/20/2016   HGB 12.5 02/10/2016   Lab Results  Component Value Date   WBC 7.7 03/10/2017   PLT 259 03/10/2017   Lab Results  Component Value Date   INR 1.07 01/09/2015   Lab Results  Component Value Date   NA 142 03/10/2017   K 4.0 03/10/2017   CL 107 03/10/2017   CO2 28 03/10/2017   BUN 9 03/10/2017   CREATININE 0.56 03/10/2017   GLUCOSE 102 (H) 03/10/2017    Discharge Medications:   Allergies as of 03/22/2017      Reactions   Lyrica [pregabalin] Swelling   Hands, legs, feet   Sulfonamide Derivatives Hives      Medication List    STOP taking these medications   PERCOCET 5-325 MG tablet Generic drug:  oxyCODONE-acetaminophen Replaced by:  oxyCODONE-acetaminophen 10-325 MG tablet     TAKE these medications   amLODipine 10 MG tablet Commonly known as:  NORVASC Take 10 mg by mouth daily.   atorvastatin 20 MG tablet Commonly known as:  LIPITOR Take 20 mg by mouth daily.   baclofen 10 MG tablet Commonly known as:  LIORESAL Take  1 tablet (10 mg total) by mouth 3 (three) times daily. As needed for muscle spasm What changed:  Another medication with the same name was added. Make sure you understand how and when to take each.   baclofen 10 MG tablet Commonly known as:  LIORESAL Take 1 tablet (10 mg total) by mouth 3 (three) times daily. As needed for muscle spasm What changed:  You were already taking a medication with the same name, and this prescription was added. Make sure you understand how and when to take each.   Biotin 10000 MCG Tabs Take 10,000 mcg by mouth at bedtime.   GOODY HEADACHE PO Take 1 packet by mouth daily as needed (headache).   HM MULTIVITAMIN ADULT GUMMY PO Take 2 tablets by mouth at bedtime.    insulin glargine 100 UNIT/ML injection Commonly known as:  LANTUS Inject 40 Units into the skin at bedtime.   levothyroxine 75 MCG tablet Commonly known as:  SYNTHROID, LEVOTHROID Take 75 mcg by mouth daily before breakfast.   losartan 100 MG tablet Commonly known as:  COZAAR Take 100 mg by mouth daily.   mirtazapine 30 MG tablet Commonly known as:  REMERON Take 30 mg by mouth at bedtime.   omeprazole 20 MG capsule Commonly known as:  PRILOSEC Take 20 mg by mouth daily.   ondansetron 4 MG tablet Commonly known as:  ZOFRAN Take 1 tablet (4 mg total) by mouth every 8 (eight) hours as needed for nausea or vomiting.   oxyCODONE 10 mg 12 hr tablet Commonly known as:  OXYCONTIN Take 1 tablet (10 mg total) by mouth every 12 (twelve) hours.   oxyCODONE-acetaminophen 10-325 MG tablet Commonly known as:  PERCOCET Take 1-2 tablets by mouth every 6 (six) hours as needed for pain. MAXIMUM TOTAL ACETAMINOPHEN DOSE IS 4000 MG PER DAY Replaces:  PERCOCET 5-325 MG tablet   sennosides-docusate sodium 8.6-50 MG tablet Commonly known as:  SENOKOT-S Take 2 tablets by mouth daily.   VICTOZA 18 MG/3ML Sopn Generic drug:  liraglutide Inject 1.8 mg into the skin daily.       Diagnostic Studies: Dg Shoulder Right Port  Result Date: 03/21/2017 CLINICAL DATA:  Status post right shoulder replacement surgery. EXAM: PORTABLE RIGHT SHOULDER COMPARISON:  CT scan 02/08/2017 FINDINGS: Well seated components of a total right shoulder arthroplasty. No complicating features are identified. The distal clavicle has been resected. The right ribs are intact and the visualized right lung is clear. IMPRESSION: Well seated right shoulder prosthesis without complicating features. Electronically Signed   By: Marijo Sanes M.D.   On: 03/21/2017 11:10    Disposition: 01-Home or Self Care    Follow-up Information    Danylle Ouk P, MD. Schedule an appointment as soon as possible for a visit in 2 weeks.    Specialty:  Orthopedic Surgery Contact information: [place] [address] [phone]            Signed: Johnny Bridge 03/22/2017, 8:18 AM

## 2017-03-22 NOTE — Progress Notes (Signed)
PT Cancellation Note  Patient Details Name: Morgan Taylor MRN: [id number] DOB: 1961-12-08   Cancelled Treatment:    Reason Eval/Treat Not Completed: PT screened, no needs identified, will sign off (per OT pt is mobilizing well and does not need PT, will sign off. )   Philomena Doheny 03/22/2017, 10:38 AM [phone]

## 2017-03-22 NOTE — Progress Notes (Signed)
Patient complained of severe pain. Shouting in room for release. Daughter at bedside. Exhausted all pain meds available. Called PA-Lindsay Stanbery for breakthtrough pain meds. Refer to orders.  Patient still currently in pain, demeanor is better but still agitated and restless. Will continue to monitor.

## 2017-03-29 NOTE — Anesthesia Procedure Notes (Addendum)
Anesthesia Regional Block: Interscalene brachial plexus block   Pre-Anesthetic Checklist: ,, timeout performed, Correct Patient, Correct Site, Correct Laterality, Correct Procedure, Correct Position, site marked, Risks and benefits discussed,  Surgical consent,  Pre-op evaluation,  At surgeon's request and post-op pain management  Laterality: Right  Prep: chloraprep       Needles:  Injection technique: Single-shot  Needle Type: Stimulator Needle - 40     Needle Length: 4cm  Needle Gauge: 22     Additional Needles:   Procedures: ultrasound guided,,,,,,,,  Narrative:  Start time: 03/29/2017 7:05 AM End time: 03/29/2017 7:10 AM Injection made incrementally with aspirations every 5 mL. Anesthesiologist: Nolon Nations  Additional Notes: BP cuff, EKG monitors applied. Sedation begun. Nerve location verified with U/S. Anesthetic injected incrementally, slowly , and after neg aspirations under direct u/s guidance. Good perineural spread. Tolerated well.

## 2017-03-29 NOTE — Addendum Note (Signed)
Addendum  created 03/29/17 1340 by Nolon Nations, MD   Sign clinical note

## 2017-03-29 NOTE — Addendum Note (Signed)
Addendum  created 03/29/17 1144 by Nolon Nations, MD   Anesthesia Intra Blocks edited, Child order released for a procedure order, Sign clinical note

## 2017-05-17 LAB — GLUCOSE, POCT (MANUAL RESULT ENTRY): POC GLUCOSE: 101 mg/dL — AB (ref 70–99)

## 2017-05-19 ENCOUNTER — Encounter: Payer: Self-pay | Admitting: Genetic Counselor

## 2017-06-28 IMAGING — CT CT SHOULDER*L* W/O CM
2 series · 14 of 20 positions shown, 17 images · non-contrast
Comparison: Right shoulder MRI 07/07/2015. Chest radiographs
03/18/2015.

CLINICAL DATA: Bilateral shoulder pain for several months. No acute
injury or prior relevant surgery. History of breast cancer with left
lumpectomy and radiation therapy in 1554.

EXAM:
CT OF THE LEFT SHOULDER WITHOUT CONTRAST; CT OF THE RIGHT SHOULDER
WITHOUT CONTRAST
TECHNIQUE: Multidetector CT imaging was performed according to the standard
protocol. Multiplanar CT image reconstructions were also generated.

[Series 2: soft tissue · axial · 0.48mm/px · z∈[+548,+665]mm · 11 of 47 slices shown, 14 images]
[im 4/47  soft-tissue]
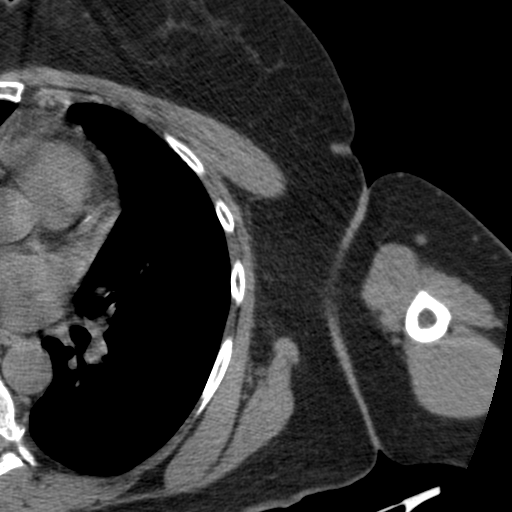
[im 4/47  bone]
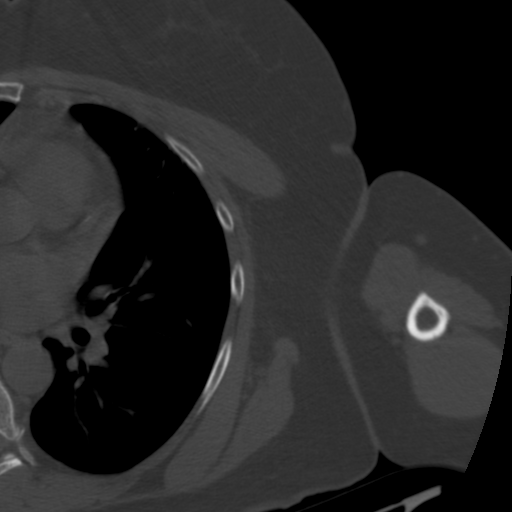
[im 8/47  bone]
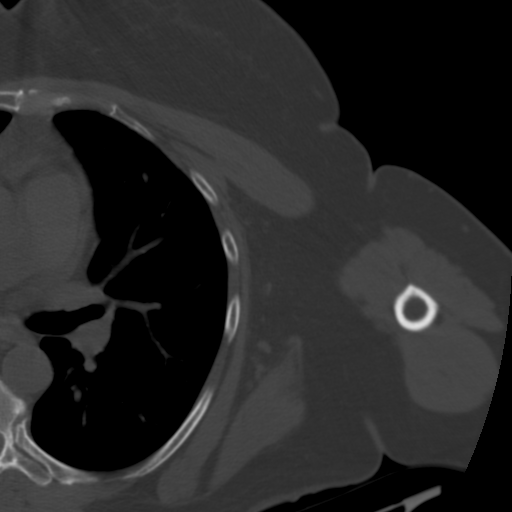
[im 11/47  bone]
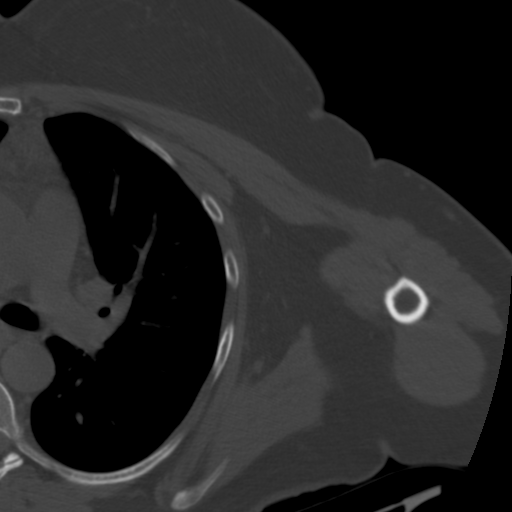
[im 15/47  bone]
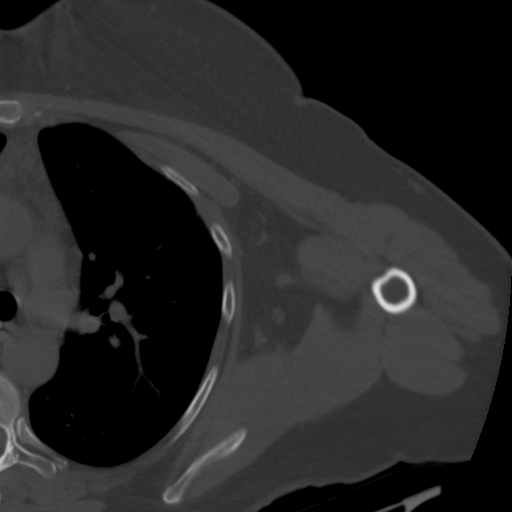
[im 18/47  soft-tissue]
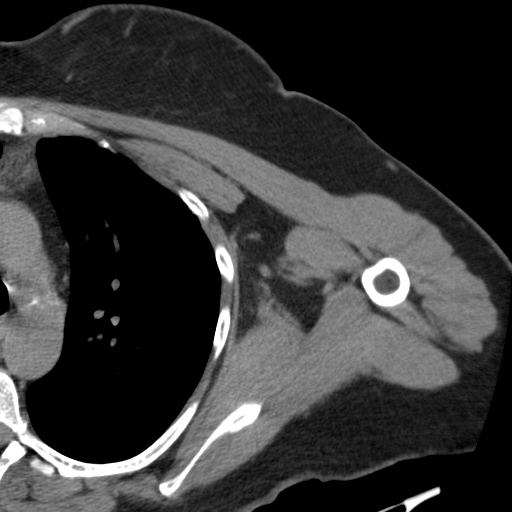
[im 18/47  bone]
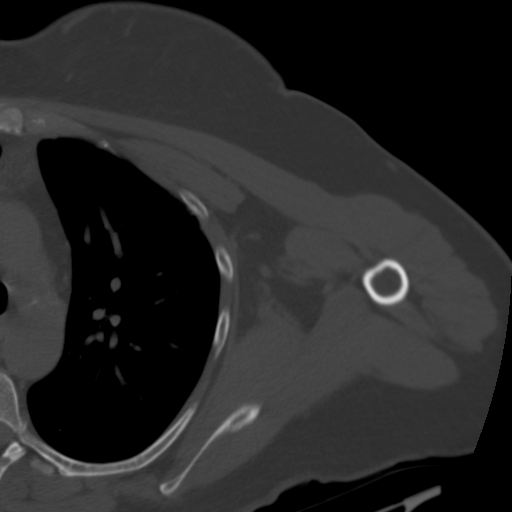
[im 25/47  bone]
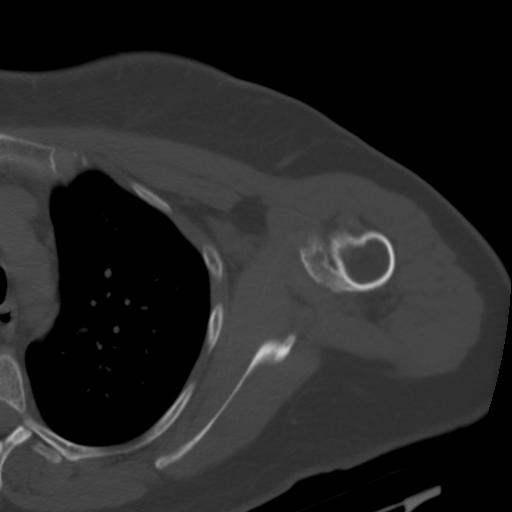
[im 29/47  bone]
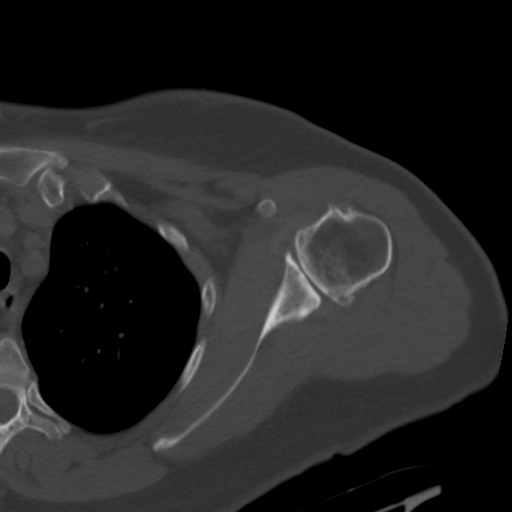
[im 32/47  bone]
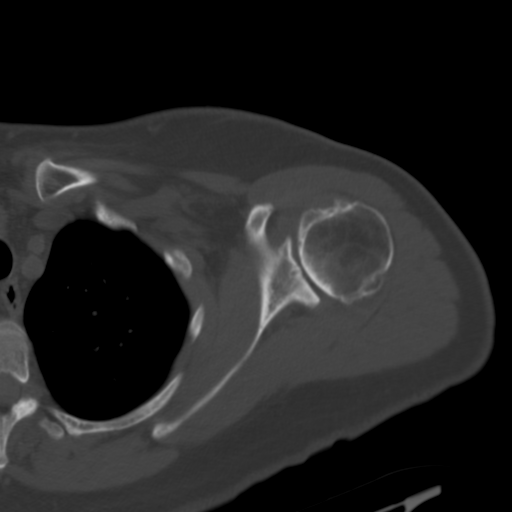
[im 36/47  soft-tissue]
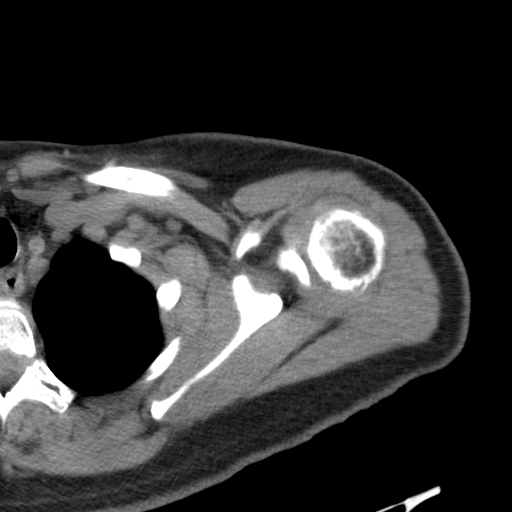
[im 36/47  bone]
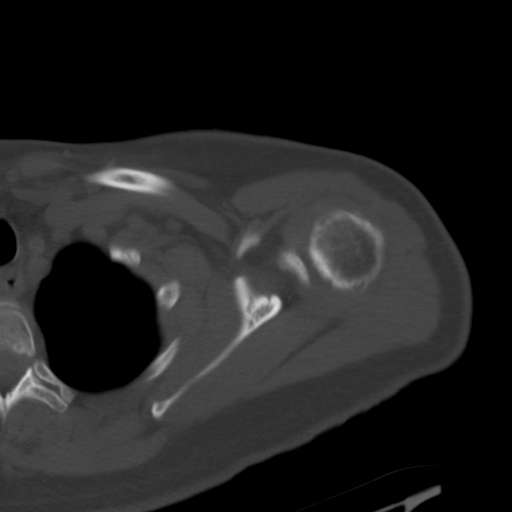
[im 39/47  bone]
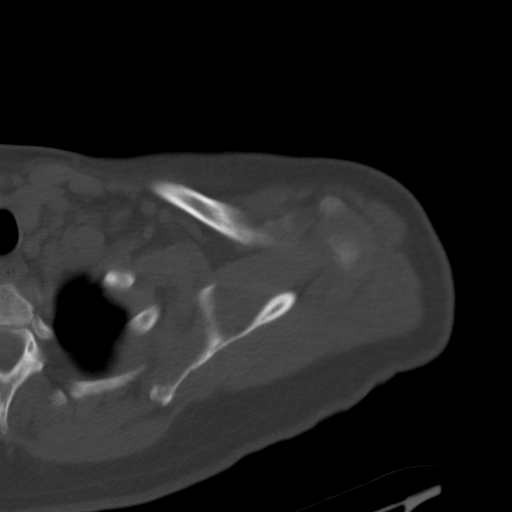
[im 43/47  bone]
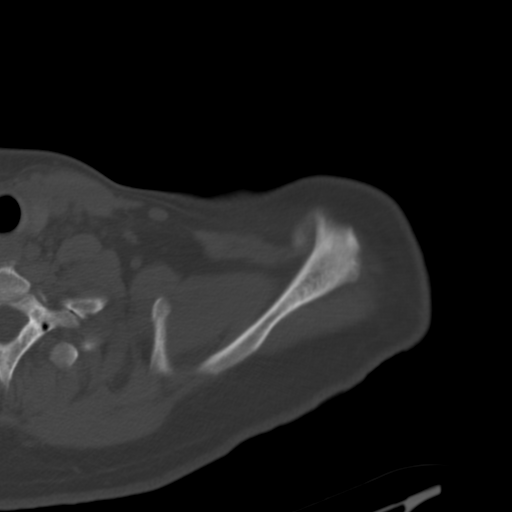

[Series 7: cor soft · coronal · 0.28mm/px · 3 of 81 slices shown]
[im 17/81  bone]
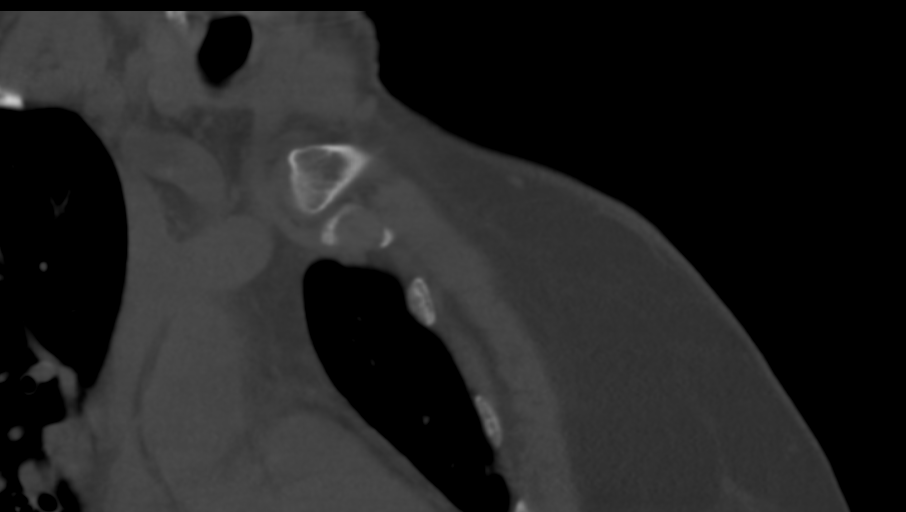
[im 33/81  bone]
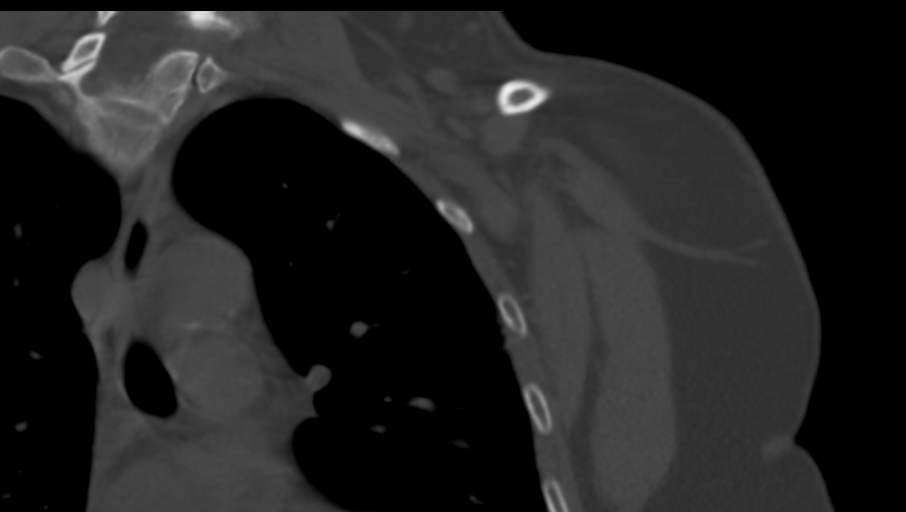
[im 49/81  bone]
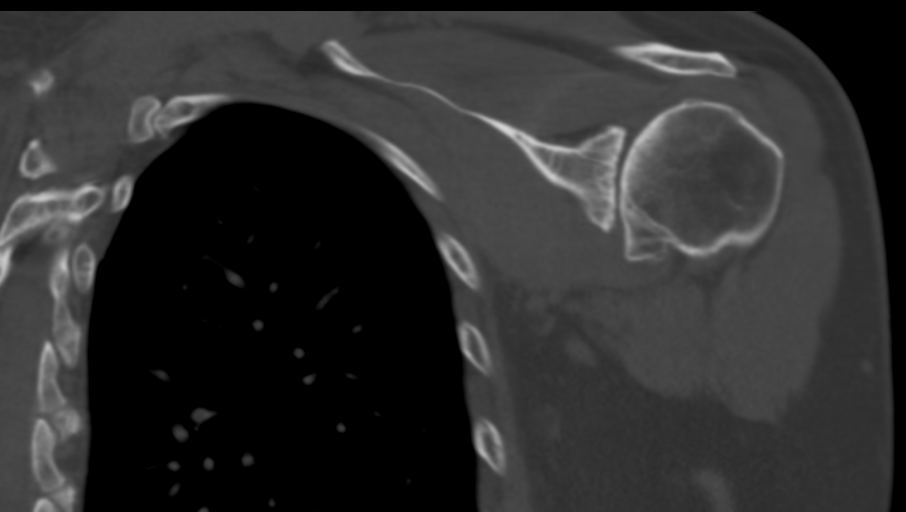

[14 of 20 positions shown; findings below may reference images not displayed]

FINDINGS: On the left, there are severe glenohumeral degenerative changes with
chondral thinning and prominent osteophytes of the humeral head and
glenoid. There is a possible 8 mm loose body superior to the humeral
head, best seen on coronal image 53. The subacromial space is
preserved. The acromion is type 2. There are minimal
acromioclavicular degenerative changes. No significant shoulder
joint effusion or rotator cuff muscular atrophy is identified.

On the right, there are lesser glenohumeral degenerative changes
with small osteophytes. There is mild subchondral cyst formation
posteriorly in the glenoid. No joint effusion or loose body
observed. Probable interval distal clavicle resection. No
significant rotator cuff muscular atrophy identified.

The visualized lungs are clear. There is no axillary adenopathy on
either side.
IMPRESSION: 1. Glenohumeral degenerative changes, left greater than right.
Possible small loose body on the left.
2. No acute osseous findings or evidence of metastatic disease.
3. Interval distal clavicle resection on the right.
4. No significant rotator cuff muscular atrophy identified.

## 2017-06-28 IMAGING — CT CT SHOULDER*R* W/O CM
2 series · 15 of 20 positions shown, 18 images · non-contrast
Comparison: Right shoulder MRI 07/07/2015. Chest radiographs
03/18/2015.

CLINICAL DATA: Bilateral shoulder pain for several months. No acute
injury or prior relevant surgery. History of breast cancer with left
lumpectomy and radiation therapy in 1554.

EXAM:
CT OF THE LEFT SHOULDER WITHOUT CONTRAST; CT OF THE RIGHT SHOULDER
WITHOUT CONTRAST
TECHNIQUE: Multidetector CT imaging was performed according to the standard
protocol. Multiplanar CT image reconstructions were also generated.

[Series 2: soft tissue · axial · 0.49mm/px · z∈[+547,+679]mm · 12 of 53 slices shown, 15 images]
[im 5/53  soft-tissue]
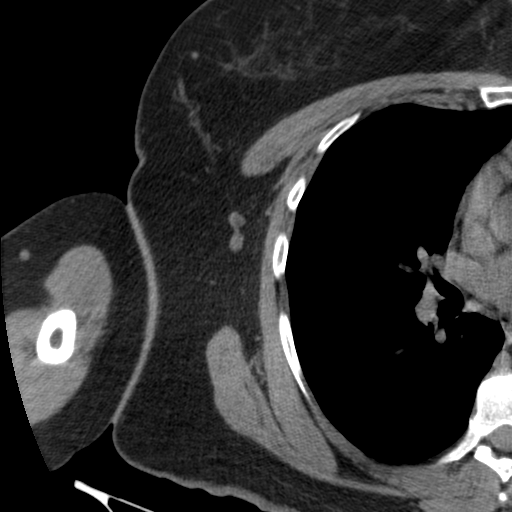
[im 5/53  bone]
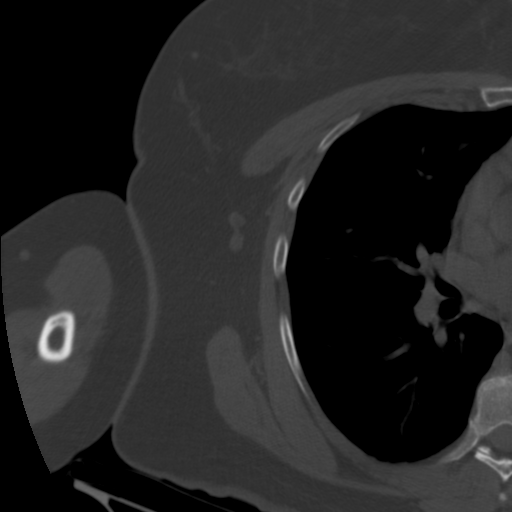
[im 9/53  bone]
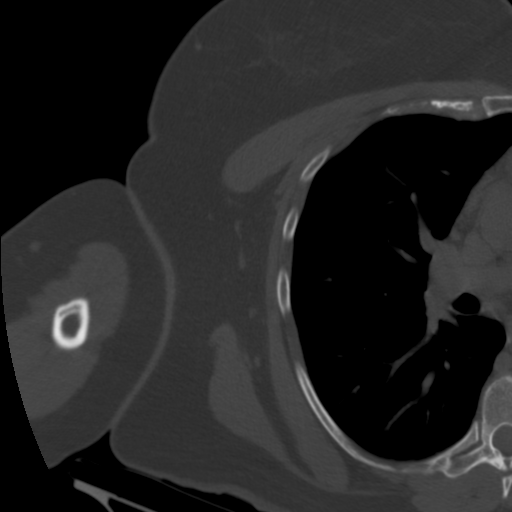
[im 13/53  bone]
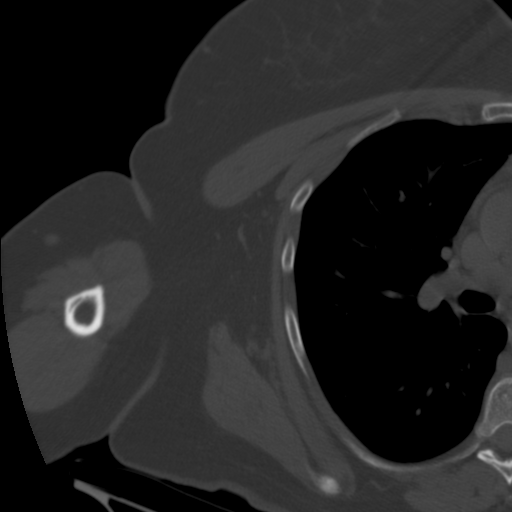
[im 17/53  bone]
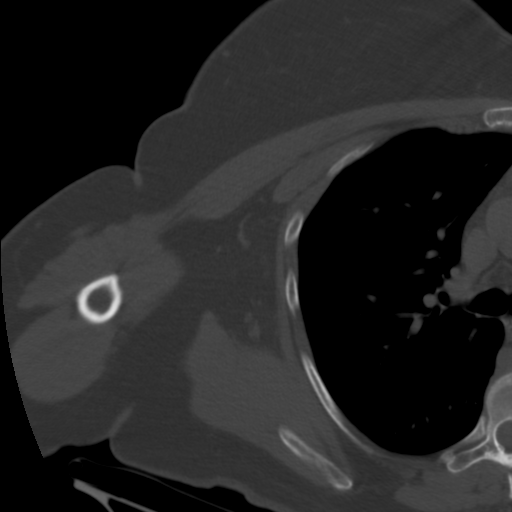
[im 21/53  soft-tissue]
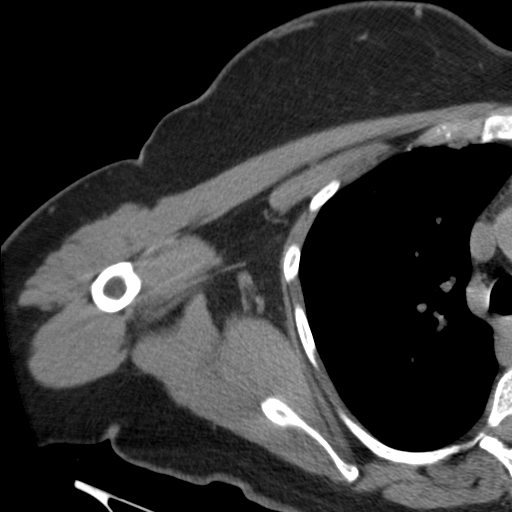
[im 21/53  bone]
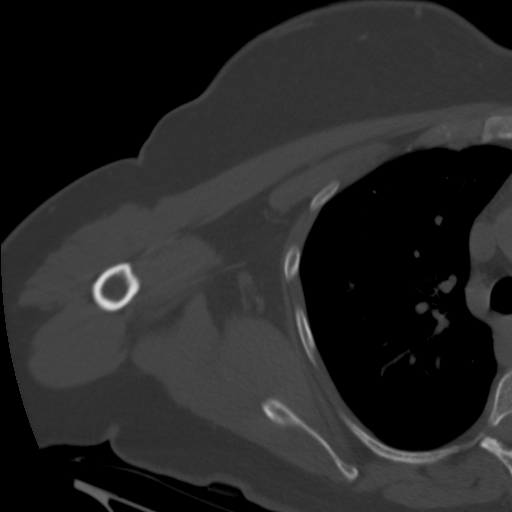
[im 25/53  bone]
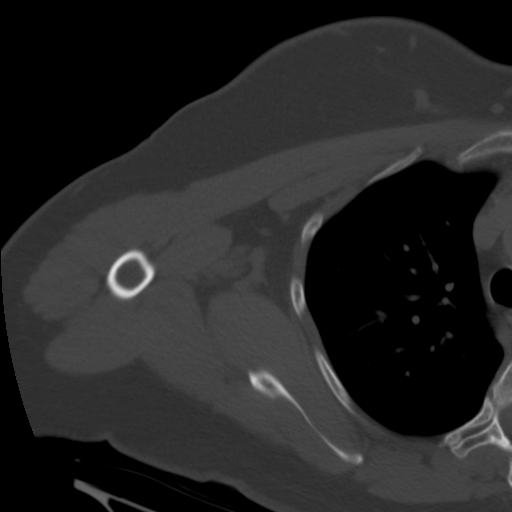
[im 29/53  bone]
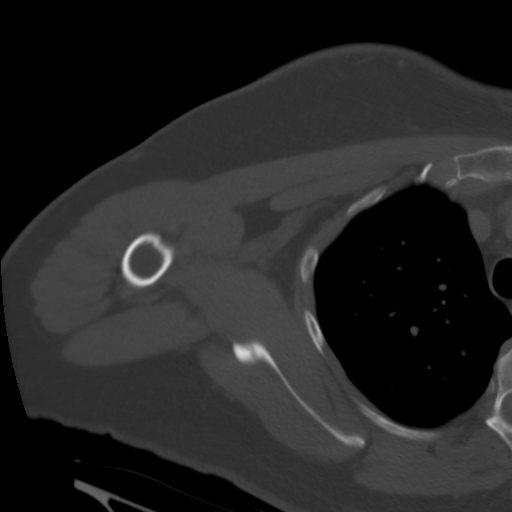
[im 33/53  bone]
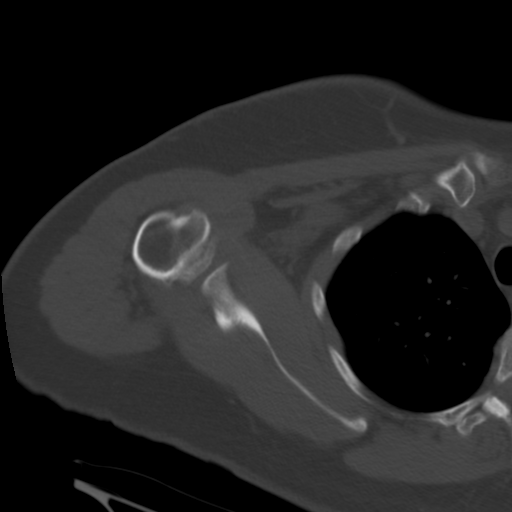
[im 37/53  soft-tissue]
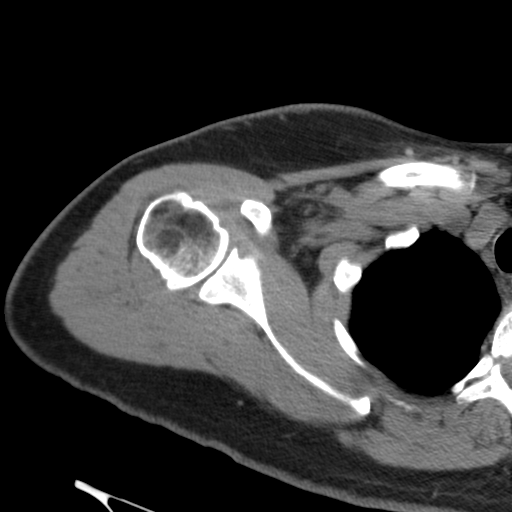
[im 37/53  bone]
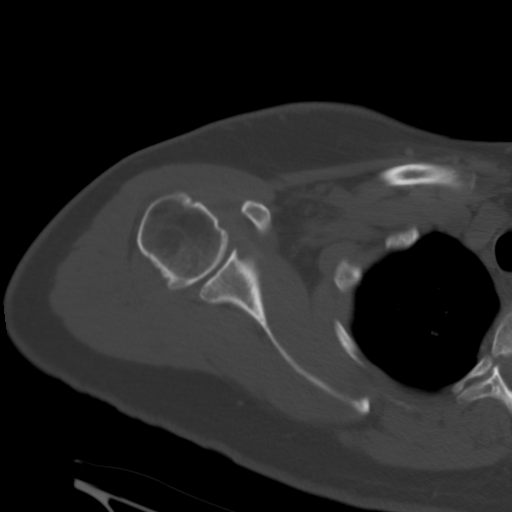
[im 41/53  bone]
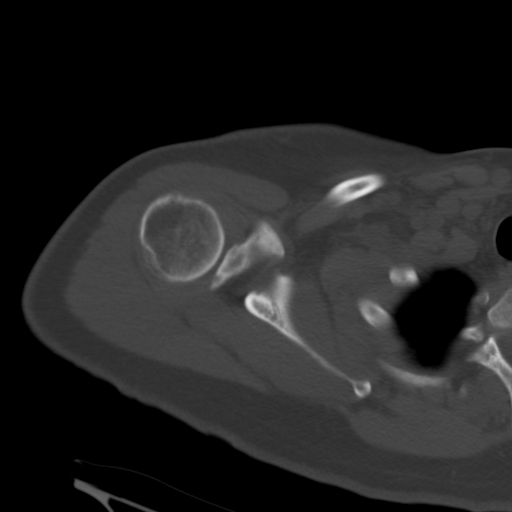
[im 45/53  bone]
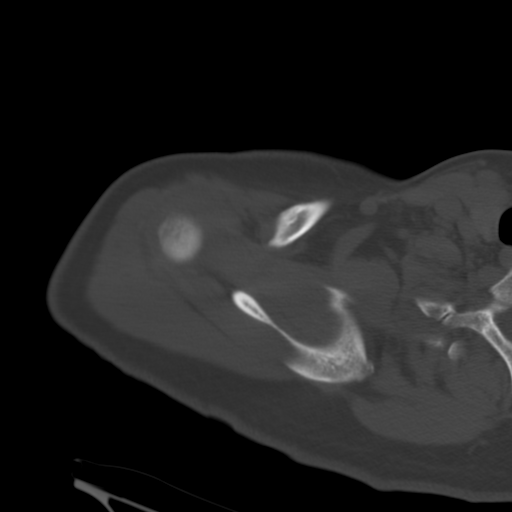
[im 49/53  bone]
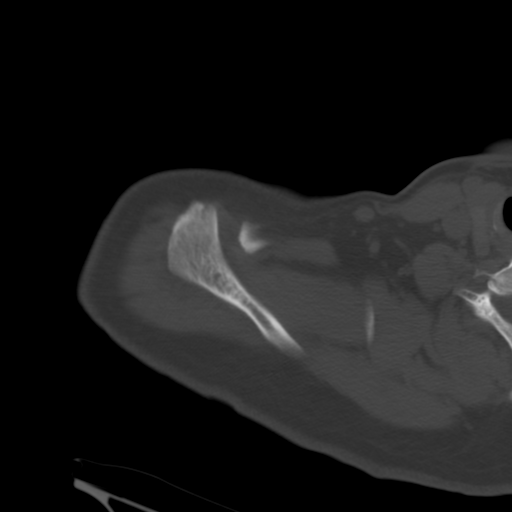

[Series 7: cor soft · coronal · 0.33mm/px · 3 of 79 slices shown]
[im 16/79  bone]
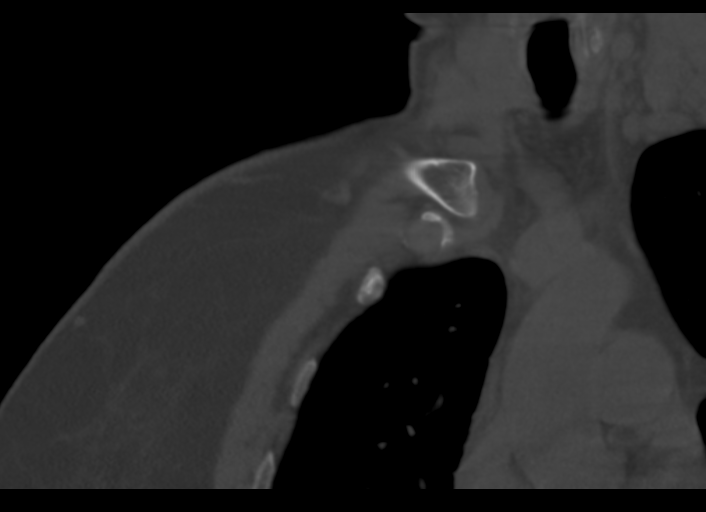
[im 32/79  bone]
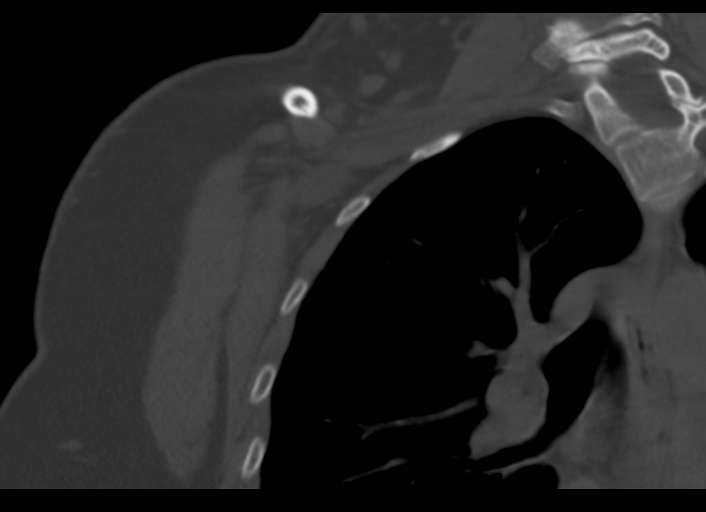
[im 47/79  bone]
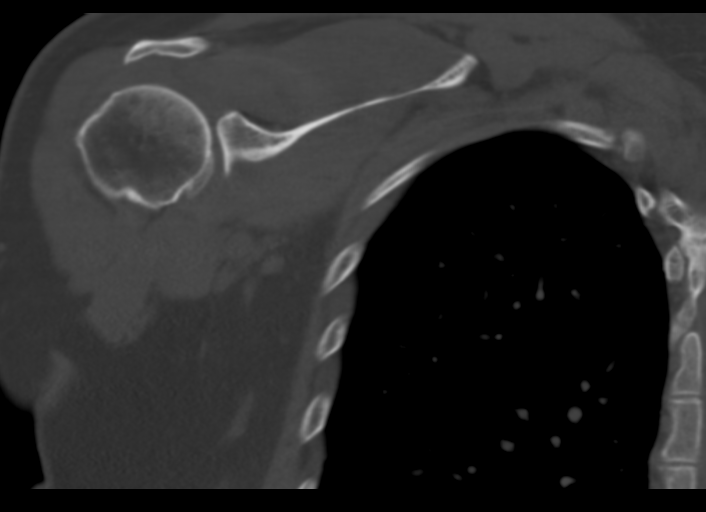

[15 of 20 positions shown; findings below may reference images not displayed]

FINDINGS: On the left, there are severe glenohumeral degenerative changes with
chondral thinning and prominent osteophytes of the humeral head and
glenoid. There is a possible 8 mm loose body superior to the humeral
head, best seen on coronal image 53. The subacromial space is
preserved. The acromion is type 2. There are minimal
acromioclavicular degenerative changes. No significant shoulder
joint effusion or rotator cuff muscular atrophy is identified.

On the right, there are lesser glenohumeral degenerative changes
with small osteophytes. There is mild subchondral cyst formation
posteriorly in the glenoid. No joint effusion or loose body
observed. Probable interval distal clavicle resection. No
significant rotator cuff muscular atrophy identified.

The visualized lungs are clear. There is no axillary adenopathy on
either side.
IMPRESSION: 1. Glenohumeral degenerative changes, left greater than right.
Possible small loose body on the left.
2. No acute osseous findings or evidence of metastatic disease.
3. Interval distal clavicle resection on the right.
4. No significant rotator cuff muscular atrophy identified.

## 2018-01-31 ENCOUNTER — Other Ambulatory Visit: Payer: Self-pay | Admitting: Internal Medicine

## 2018-01-31 DIAGNOSIS — Z1231 Encounter for screening mammogram for malignant neoplasm of breast: Secondary | ICD-10-CM

## 2018-02-12 DIAGNOSIS — Z853 Personal history of malignant neoplasm of breast: Secondary | ICD-10-CM | POA: Insufficient documentation

## 2018-02-12 DIAGNOSIS — R1084 Generalized abdominal pain: Secondary | ICD-10-CM | POA: Diagnosis present

## 2018-02-12 DIAGNOSIS — E119 Type 2 diabetes mellitus without complications: Secondary | ICD-10-CM | POA: Insufficient documentation

## 2018-02-12 DIAGNOSIS — Z79899 Other long term (current) drug therapy: Secondary | ICD-10-CM | POA: Diagnosis not present

## 2018-02-12 DIAGNOSIS — E039 Hypothyroidism, unspecified: Secondary | ICD-10-CM | POA: Insufficient documentation

## 2018-02-12 DIAGNOSIS — Z794 Long term (current) use of insulin: Secondary | ICD-10-CM | POA: Insufficient documentation

## 2018-02-12 DIAGNOSIS — I10 Essential (primary) hypertension: Secondary | ICD-10-CM | POA: Insufficient documentation

## 2018-02-12 DIAGNOSIS — R110 Nausea: Secondary | ICD-10-CM | POA: Diagnosis not present

## 2018-02-12 NOTE — ED Triage Notes (Signed)
PT presents with c/o abdominal pain, chest pain that started about 20 minutes ago and burping.

## 2018-02-13 ENCOUNTER — Emergency Department (HOSPITAL_BASED_OUTPATIENT_CLINIC_OR_DEPARTMENT_OTHER): Payer: Medicare Other

## 2018-02-13 ENCOUNTER — Encounter (HOSPITAL_BASED_OUTPATIENT_CLINIC_OR_DEPARTMENT_OTHER): Payer: Self-pay | Admitting: Emergency Medicine

## 2018-02-13 ENCOUNTER — Emergency Department (HOSPITAL_BASED_OUTPATIENT_CLINIC_OR_DEPARTMENT_OTHER)
Admission: EM | Admit: 2018-02-13 | Discharge: 2018-02-13 | Disposition: A | Discharge status: Home / Self Care | Payer: Medicare Other | Attending: Emergency Medicine | Admitting: Emergency Medicine

## 2018-02-13 ENCOUNTER — Other Ambulatory Visit: Payer: Self-pay

## 2018-02-13 DIAGNOSIS — R1084 Generalized abdominal pain: Secondary | ICD-10-CM

## 2018-02-13 LAB — CBC WITH DIFFERENTIAL/PLATELET
BASOS PCT: 0 %
Basophils Absolute: 0 10*3/uL (ref 0.0–0.1)
EOS ABS: 0.3 10*3/uL (ref 0.0–0.7)
Eosinophils Relative: 3 %
HCT: 43.2 % (ref 36.0–46.0)
HEMOGLOBIN: 13.9 g/dL (ref 12.0–15.0)
LYMPHS ABS: 3.1 10*3/uL (ref 0.7–4.0)
Lymphocytes Relative: 34 %
MCH: 29.3 pg (ref 26.0–34.0)
MCHC: 32.2 g/dL (ref 30.0–36.0)
MCV: 91.1 fL (ref 78.0–100.0)
MONOS PCT: 7 %
Monocytes Absolute: 0.6 10*3/uL (ref 0.1–1.0)
NEUTROS PCT: 56 %
Neutro Abs: 5 10*3/uL (ref 1.7–7.7)
Platelets: 259 10*3/uL (ref 150–400)
RBC: 4.74 MIL/uL (ref 3.87–5.11)
RDW: 13.7 % (ref 11.5–15.5)
WBC: 9 10*3/uL (ref 4.0–10.5)

## 2018-02-13 LAB — COMPREHENSIVE METABOLIC PANEL
ALK PHOS: 82 U/L (ref 38–126)
ALT: 26 U/L (ref 14–54)
ANION GAP: 10 (ref 5–15)
AST: 25 U/L (ref 15–41)
Albumin: 4.1 g/dL (ref 3.5–5.0)
BILIRUBIN TOTAL: 0.6 mg/dL (ref 0.3–1.2)
BUN: 11 mg/dL (ref 6–20)
CALCIUM: 9 mg/dL (ref 8.9–10.3)
CO2: 25 mmol/L (ref 22–32)
Chloride: 106 mmol/L (ref 101–111)
Creatinine, Ser: 0.64 mg/dL (ref 0.44–1.00)
GFR calc non Af Amer: >60 mL/min (ref >60)
Glucose, Bld: 112 mg/dL — ABNORMAL HIGH (ref 65–99)
POTASSIUM: 3.7 mmol/L (ref 3.5–5.1)
SODIUM: 141 mmol/L (ref 135–145)
TOTAL PROTEIN: 7.9 g/dL (ref 6.5–8.1)

## 2018-02-13 LAB — TROPONIN I

## 2018-02-13 LAB — LIPASE, BLOOD: Lipase: 43 U/L (ref 11–51)

## 2018-02-13 MED ORDER — DICYCLOMINE HCL 20 MG PO TABS
20.0000 mg | ORAL_TABLET | Freq: Two times a day (BID) | ORAL | 0 refills | Status: DC
Start: 2018-02-13 — End: 2020-11-11

## 2018-02-13 MED ORDER — ONDANSETRON 4 MG PO TBDP: 4.0000 mg | ORAL_TABLET | Freq: Three times a day (TID) | ORAL | 0 refills | Status: DC | PRN

## 2018-02-13 MED ORDER — DICYCLOMINE HCL 10 MG PO CAPS
10.0000 mg | ORAL_CAPSULE | Freq: Once | ORAL | Status: AC
  Administered 2018-02-13: 10 mg via ORAL
  Filled 2018-02-13: qty 1

## 2018-02-13 MED ORDER — GI COCKTAIL ~~LOC~~
30.0000 mL | Freq: Once | ORAL | Status: AC
  Administered 2018-02-13: 30 mL via ORAL
  Filled 2018-02-13: qty 30

## 2018-02-13 NOTE — ED Notes (Signed)
Pt reports lower abdominal pain and burping.

## 2018-02-13 NOTE — ED Notes (Signed)
Ginger ale given to patient. Pt ambulate to restroom without difficulty.

## 2018-02-13 NOTE — ED Notes (Signed)
Pt discharged to home with family. NAD.  

## 2018-02-13 NOTE — Discharge Instructions (Signed)
You were seen today for abdominal pain.  Your workup is reassuring.  There is no evidence of obstruction or other abnormality.  Take Bentyl and Zofran as needed.  Continue your PPI at home.  If you have continued symptoms or worsening symptoms you need to be reevaluated.

## 2018-02-13 NOTE — ED Provider Notes (Signed)
[facility] EMERGENCY DEPARTMENT Provider Note   CSN: [id number] Arrival date & time: 02/12/18  2355     History   Chief Complaint Chief Complaint  Patient presents with  . Chest Pain  . Abdominal Pain    HPI Morgan Taylor is a 57 y.o. female.  HPI  This is a 57 year old female with a history of diabetes, hypertension, hyperlipidemia who presents with abdominal pain.  Patient reports that she woke up with sharp generalized abdominal pain that is nonradiating.  She states she felt like she needed to go the bathroom.  She was unable to have a bowel movement.  Her last normal bowel movement was yesterday.  She reports nausea without vomiting.  She states that pain progressively worsened and radiated into her chest.  Reports normal meal last night.  Nothing seems to make the pain better or worse.  She did not take anything at home for the pain.  She states "I been burping and gassy."  She takes Nexium daily.  Currently she rates her pain at 8 out of 10.  Denies any fevers or infectious symptoms.  Denies any urinary symptoms.  Past Medical History:  Diagnosis Date  . Anxiety   . Arthritis   . Cancer [facility]) 2009   lumpectomy  . Depression    takes Remeron nightly  . Diabetes mellitus    takes Lantus and Victoza. Average fasting blood sugar runs 89  . GERD (gastroesophageal reflux disease)    takes Omeprazole daily  . Hyperlipemia    takes Lipitor daily  . Hypertension    takes Amlodipine and Losartan daily  . Hypothyroidism    takes Synthroid daily  . Neck pain   . Numbness    in both hands  . Primary localized osteoarthrosis of right shoulder 03/21/2017    Patient Active Problem List   Diagnosis Date Noted  . Primary localized osteoarthrosis of right shoulder 03/21/2017  . S/P shoulder replacement 02/19/2016  . Genetic testing 12/11/2015  . primary Osteoarthritis of left knee 01/23/2015  . Osteoarthritis of right knee 08/15/2014  . Primary cancer of lower  outer quadrant of left female breast ([place]) 01/23/2012  . HYPERGLYCEMIA 05/03/2007  . HYPERPLASIA, ENDOMETRIAL NOS 02/22/2007  . DYSMENORRHEA 10/18/2006  . DEGENERATIVE JOINT DISEASE 10/18/2006  . INSOMNIA 10/18/2006  . CHEST PAIN 10/18/2006  . OBESITY 10/05/2006  . ANXIETY 10/05/2006  . HYPERTENSION 10/05/2006  . GASTROENTERITIS 10/05/2006  . MENORRHAGIA, PERIMENOPAUSAL 10/05/2006    Past Surgical History:  Procedure Laterality Date  . APPENDECTOMY    . BREAST LUMPECTOMY Left   . CESAREAN SECTION    . COLONOSCOPY    . DILATION AND CURETTAGE OF UTERUS    . SHOULDER ARTHROSCOPY Left    cleaned out joint  . TOTAL KNEE ARTHROPLASTY Right 08/15/2014   Procedure: RIGHT TOTAL KNEE ARTHROPLASTY;  Surgeon: Yvette Rack., MD;  Location: [place];  Service: Orthopedics;  Laterality: Right;  . TOTAL KNEE ARTHROPLASTY Left 01/23/2015   Procedure: TOTAL KNEE ARTHROPLASTY;  Surgeon: Yvette Rack., MD;  Location: [place];  Service: Orthopedics;  Laterality: Left;  . TOTAL SHOULDER ARTHROPLASTY Left 02/19/2016   Procedure: LEFT TOTAL SHOULDER ARTHROPLASTY;  Surgeon: Marchia Bond, MD;  Location: [place];  Service: Orthopedics;  Laterality: Left;  . TOTAL SHOULDER ARTHROPLASTY Right 03/21/2017   Procedure: TOTAL SHOULDER ARTHROPLASTY;  Surgeon: Marchia Bond, MD;  Location: [place];  Service: Orthopedics;  Laterality: Right;    OB History  No data available       Home Medications    Prior to Admission medications   Medication Sig Start Date End Date Taking? Authorizing Provider  amLODipine (NORVASC) 10 MG tablet Take 10 mg by mouth daily.     [provider]  Aspirin-Acetaminophen-Caffeine (GOODY HEADACHE PO) Take 1 packet by mouth daily as needed (headache).    [provider]  atorvastatin (LIPITOR) 20 MG tablet Take 20 mg by mouth daily.     [provider]  baclofen (LIORESAL) 10 MG tablet Take 1 tablet (10 mg total) by mouth 3 (three) times daily. As needed for  muscle spasm Patient not taking: Reported on 03/07/2017 02/19/16   Marchia Bond, MD  baclofen (LIORESAL) 10 MG tablet Take 1 tablet (10 mg total) by mouth 3 (three) times daily. As needed for muscle spasm 03/21/17   Marchia Bond, MD  Biotin 10000 MCG TABS Take 10,000 mcg by mouth at bedtime.    [provider]  dicyclomine (BENTYL) 20 MG tablet Take 1 tablet (20 mg total) by mouth 2 (two) times daily. 02/13/18   Horton, Barbette Hair, MD  insulin glargine (LANTUS) 100 UNIT/ML injection Inject 40 Units into the skin at bedtime.     [provider]  levothyroxine (SYNTHROID, LEVOTHROID) 75 MCG tablet Take 75 mcg by mouth daily before breakfast.  01/05/16   [provider]  Liraglutide (VICTOZA) 18 MG/3ML SOPN Inject 1.8 mg into the skin daily.    [provider]  losartan (COZAAR) 100 MG tablet Take 100 mg by mouth daily.     [provider]  mirtazapine (REMERON) 30 MG tablet Take 30 mg by mouth at bedtime. 08/29/16   [provider]  Multiple Vitamins-Minerals (HM MULTIVITAMIN ADULT GUMMY PO) Take 2 tablets by mouth at bedtime.    [provider]  omeprazole (PRILOSEC) 20 MG capsule Take 20 mg by mouth daily.     [provider]  ondansetron (ZOFRAN ODT) 4 MG disintegrating tablet Take 1 tablet (4 mg total) by mouth every 8 (eight) hours as needed for nausea or vomiting. 02/13/18   Horton, Barbette Hair, MD  ondansetron (ZOFRAN) 4 MG tablet Take 1 tablet (4 mg total) by mouth every 8 (eight) hours as needed for nausea or vomiting. 03/21/17   Marchia Bond, MD  oxyCODONE (OXYCONTIN) 10 mg 12 hr tablet Take 1 tablet (10 mg total) by mouth every 12 (twelve) hours. 03/22/17   Marchia Bond, MD  oxyCODONE-acetaminophen (PERCOCET) 10-325 MG tablet Take 1-2 tablets by mouth every 6 (six) hours as needed for pain. MAXIMUM TOTAL ACETAMINOPHEN DOSE IS 4000 MG PER DAY 03/21/17   Marchia Bond, MD  sennosides-docusate sodium (SENOKOT-S) 8.6-50 MG  tablet Take 2 tablets by mouth daily. 03/21/17   Marchia Bond, MD    Family History Family History  Problem Relation Age of Onset  . Cancer Paternal Grandmother        NOS  . Pancreatic cancer Paternal Aunt   . Cancer Paternal Aunt        NOS  . Breast cancer Mother 27       Stage 3  . Colon cancer Neg Hx   . Rectal cancer Neg Hx   . Stomach cancer Neg Hx     Social History Social History   Tobacco Use  . Smoking status: Former Research scientist (life sciences)  . Smokeless tobacco: Never Used  . Tobacco comment: quit smoking 14 yrs ago  Substance Use Topics  . Alcohol use:  No  . Drug use: No     Allergies   Lyrica [pregabalin] and Sulfonamide derivatives   Review of Systems Review of Systems  Constitutional: Negative for fever.  Respiratory: Negative for cough.   Cardiovascular: Negative for chest pain.  Gastrointestinal: Positive for abdominal pain and nausea. Negative for blood in stool, constipation and diarrhea.  Genitourinary: Negative for dysuria.  Musculoskeletal: Negative for back pain.  Skin: Negative for rash.  All other systems reviewed and are negative.    Physical Exam Updated Vital Signs BP 116/74 (BP Location: Left Arm)   Pulse 69   Temp 97.8 F (36.6 C) (Oral)   Resp 20   SpO2 100%   Physical Exam  Constitutional: She is oriented to person, place, and time. She appears well-developed and well-nourished.  Overweight, no acute distress  HENT:  Head: Normocephalic and atraumatic.  Cardiovascular: Normal rate, regular rhythm, normal heart sounds and normal pulses.  Pulmonary/Chest: Effort normal and breath sounds normal. No respiratory distress. She has no wheezes.  Abdominal: Soft. There is no tenderness.  Hyperactive bowel sounds, no focal tenderness, rebound, or guarding  Neurological: She is alert and oriented to person, place, and time.  Skin: Skin is warm and dry.  Psychiatric: She has a normal mood and affect.  Nursing note and vitals reviewed.    ED  Treatments / Results  Labs (all labs ordered are listed, but only abnormal results are displayed) Labs Reviewed  COMPREHENSIVE METABOLIC PANEL - Abnormal; Notable for the following components:      Result Value   Glucose, Bld 112 (*)    All other components within normal limits  CBC WITH DIFFERENTIAL/PLATELET  LIPASE, BLOOD  TROPONIN I    EKG  EKG Interpretation  Date/Time:  Tuesday February 13 2018 00:01:31 EST Ventricular Rate:  77 PR Interval:  136 QRS Duration: 96 QT Interval:  400 QTC Calculation: 452 R Axis:   91 Text Interpretation:  Sinus rhythm with Premature supraventricular complexes Rightward axis Nonspecific ST abnormality Abnormal QRS-T angle, consider primary T wave abnormality Abnormal ECG Similar to prior Confirmed by Thayer Jew [phone]) on 02/13/2018 2:57:54 AM       Radiology Dg Abdomen Acute W/chest  Result Date: 02/13/2018 CLINICAL DATA:  Abdominal pain. EXAM: DG ABDOMEN ACUTE W/ 1V CHEST COMPARISON:  None. FINDINGS: The cardiomediastinal contours are normal. The lungs are clear. Bilateral shoulder arthroplasties. There is no free intra-abdominal air. No dilated bowel loops to suggest obstruction. Small to moderate volume of stool throughout the colon. Multiple pelvic calcifications consistent with phleboliths. No acute osseous abnormalities are seen. IMPRESSION: Negative abdominal radiographs.  No acute cardiopulmonary disease. Electronically Signed   By: Jeb Levering M.D.   On: 02/13/2018 02:40    Procedures Procedures (including critical care time)  Medications Ordered in ED Medications  gi cocktail (Maalox,Lidocaine,Donnatal) (30 mLs Oral Given 02/13/18 0217)  dicyclomine (BENTYL) capsule 10 mg (10 mg Oral Given 02/13/18 0217)     Initial Impression / Assessment and Plan / ED Course  I have reviewed the triage vital signs and the nursing notes.  Pertinent labs & imaging results that were available during my care of the patient were  reviewed by me and considered in my medical decision making (see chart for details).     She presents with abdominal pain.  Acute in onset.  She is nontoxic appearing on exam.  Vital signs reassuring.  Her abdominal exam is fairly benign.  She does have hyperactive bowel sounds but no  focal tenderness on exam.  No signs of peritonitis.  Considerations include reflux, gastritis, less likely small bowel obstruction.  Given diffuse nature of pain, doubt gallbladder pathology.  She is status post appendectomy.  Basic lab work obtained.  Patient given Bentyl and GI cocktail.  Lab work is largely reassuring including LFTs and lipase.  Troponin is negative.  And screening EKG shows no signs of acute ischemia.  Acute abdominal series shows no evidence of air-fluid level suggestive of SBO.  On recheck, patient states that she feels somewhat better.  She is able to p.o. challenge.  Recommend continued Nexium at home.  Follow-up with GI if symptoms persist.  If symptoms worsen or she has any new symptoms she should be reevaluated immediately.  No indication at this time for advanced imaging.  After history, exam, and medical workup I feel the patient has been appropriately medically screened and is safe for discharge home. Pertinent diagnoses were discussed with the patient. Patient was given return precautions.   Final Clinical Impressions(s) / ED Diagnoses   Final diagnoses:  Generalized abdominal pain    ED Discharge Orders        Ordered    ondansetron (ZOFRAN ODT) 4 MG disintegrating tablet  Every 8 hours PRN     02/13/18 0330    dicyclomine (BENTYL) 20 MG tablet  2 times daily     02/13/18 0330       Horton, Barbette Hair, MD 02/13/18 [phone]

## 2018-02-19 ENCOUNTER — Ambulatory Visit
Admission: RE | Admit: 2018-02-19 | Discharge: 2018-02-19 | Disposition: A | Discharge status: Home / Self Care | Payer: Medicare Other | Source: Ambulatory Visit | Attending: Internal Medicine | Admitting: Internal Medicine

## 2018-02-19 DIAGNOSIS — Z1231 Encounter for screening mammogram for malignant neoplasm of breast: Secondary | ICD-10-CM

## 2018-02-19 HISTORY — DX: Personal history of irradiation: Z92.3

## 2018-04-14 LAB — GLUCOSE, POCT (MANUAL RESULT ENTRY): POC Glucose: 133 mg/dl — AB (ref 70–99)

## 2018-06-13 ENCOUNTER — Other Ambulatory Visit: Payer: Self-pay | Admitting: Physician Assistant

## 2018-06-13 DIAGNOSIS — N644 Mastodynia: Secondary | ICD-10-CM

## 2018-06-19 ENCOUNTER — Ambulatory Visit
Admission: RE | Admit: 2018-06-19 | Discharge: 2018-06-19 | Disposition: A | Discharge status: Home / Self Care | Payer: Medicare Other | Source: Ambulatory Visit | Attending: Physician Assistant | Admitting: Physician Assistant

## 2018-06-19 DIAGNOSIS — N644 Mastodynia: Secondary | ICD-10-CM

## 2018-06-19 HISTORY — DX: Malignant neoplasm of unspecified site of unspecified female breast: C50.919

## 2018-07-19 DIAGNOSIS — H9203 Otalgia, bilateral: Secondary | ICD-10-CM | POA: Insufficient documentation

## 2018-08-20 ENCOUNTER — Ambulatory Visit: Payer: Medicare Other | Attending: Family Medicine | Admitting: Physical Therapy

## 2018-08-20 ENCOUNTER — Encounter: Payer: Self-pay | Admitting: Physical Therapy

## 2018-08-20 DIAGNOSIS — M546 Pain in thoracic spine: Secondary | ICD-10-CM | POA: Insufficient documentation

## 2018-08-20 DIAGNOSIS — M542 Cervicalgia: Secondary | ICD-10-CM | POA: Insufficient documentation

## 2018-08-20 NOTE — Therapy (Signed)
[place], [place], [address] Phone: [phone]   Fax:  [phone]  Physical Therapy Evaluation  Patient Details  Name: Morgan Taylor MRN: [id number] Date of Birth: 06-Jan-1961 Referring Provider: Dr Nancy Fetter. [facility]   Encounter Date: 08/20/2018  PT End of Session - 08/20/18 0902    Visit Number  1    Number of Visits  12    Date for PT Re-Evaluation  10/01/18    PT Start Time  0805    PT Stop Time  8676    PT Time Calculation (min)  42 min    Activity Tolerance  Patient tolerated treatment well    Behavior During Therapy  [facility] for tasks assessed/performed       Past Medical History:  Diagnosis Date  . Anxiety   . Arthritis   . Breast cancer ([place])   . Cancer [facility]) 2009   lumpectomy  . Depression    takes Remeron nightly  . Diabetes mellitus    takes Lantus and Victoza. Average fasting blood sugar runs 89  . GERD (gastroesophageal reflux disease)    takes Omeprazole daily  . Hyperlipemia    takes Lipitor daily  . Hypertension    takes Amlodipine and Losartan daily  . Hypothyroidism    takes Synthroid daily  . Neck pain   . Numbness    in both hands  . Personal history of radiation therapy   . Primary localized osteoarthrosis of right shoulder 03/21/2017    Past Surgical History:  Procedure Laterality Date  . APPENDECTOMY    . BREAST BIOPSY    . BREAST LUMPECTOMY Left   . CESAREAN SECTION    . COLONOSCOPY    . DILATION AND CURETTAGE OF UTERUS    . SHOULDER ARTHROSCOPY Left    cleaned out joint  . TOTAL KNEE ARTHROPLASTY Right 08/15/2014   Procedure: RIGHT TOTAL KNEE ARTHROPLASTY;  Surgeon: Yvette Rack., MD;  Location: [place];  Service: Orthopedics;  Laterality: Right;  . TOTAL KNEE ARTHROPLASTY Left 01/23/2015   Procedure: TOTAL KNEE ARTHROPLASTY;  Surgeon: Yvette Rack., MD;  Location: [place];  Service: Orthopedics;  Laterality: Left;  . TOTAL SHOULDER ARTHROPLASTY Left  02/19/2016   Procedure: LEFT TOTAL SHOULDER ARTHROPLASTY;  Surgeon: Marchia Bond, MD;  Location: [place];  Service: Orthopedics;  Laterality: Left;  . TOTAL SHOULDER ARTHROPLASTY Right 03/21/2017   Procedure: TOTAL SHOULDER ARTHROPLASTY;  Surgeon: Marchia Bond, MD;  Location: [place];  Service: Orthopedics;  Laterality: Right;    There were no vitals filed for this visit.   Subjective Assessment - 08/20/18 0837    Subjective  Pt relays neck pain Lt>Rt for one year that is getting worse over the last month. She now relays headaches at least 3 per week and ear aches. She has been seen by ortho, PCP, and ENT who suspect neck OA and referred her to PT. She has not had recent imaging of her neck and her referral is for back pain so PT office will contact PCP for neck referral.     Pertinent History  bilat TSA and TKA per pt report    Patient Stated Goals  find out what you can do for my neck    Currently in Pain?  Yes    Pain Score  8     Pain Location  Neck    Pain Orientation  Left;Lower;Right    Pain Descriptors / Indicators  Sharp;Shooting    Pain Radiating Towards  radiculopathy into both hands    Pain Onset  More than a month ago    Pain Frequency  Constant    Aggravating Factors   moving or turning her head, sometimes carrying groceries    Pain Relieving Factors  heat, pain meds         [facility] PT Assessment - 08/20/18 0001      Assessment   Medical Diagnosis  neck pain    Referring Provider  Dr Nancy Fetter. [facility]    Hand Dominance  Right    Next MD Visit  one month    Prior Therapy  knees and shoulders      Precautions   Precautions  None      Balance Screen   Has the patient fallen in the past 6 months  No      Prior Function   Level of Independence  Independent    Vocation  Full time employment    Vocation Requirements  sitting office work, 3rd shift      Cognition   Overall Cognitive Status  Within Functional Limits for tasks assessed      Observation/Other  Assessments   Focus on Therapeutic Outcomes (FOTO)   not done, set up for back and it is neck      Sensation   Light Touch  Appears Intact      Posture/Postural Control   Posture Comments  slumped posture with rounded shoulders and fwd head      ROM / Strength   AROM / PROM / Strength  AROM;Strength      AROM   AROM Assessment Site  Cervical    Cervical Flexion  WFL    Cervical Extension  30    Cervical - Right Side Bend  25    Cervical - Left Side Bend  35   sharp pain reported   Cervical - Right Rotation  70    Cervical - Left Rotation  70      Strength   Overall Strength Comments  UE WFL but shoulder overall 4/5 MMT      Palpation   Palpation comment  TTP lower cervical/upper T spine and Lt UT with T.P      Special Tests   Other special tests  neg spurlings test                Objective measurements completed on examination: See above findings.      [facility] Adult PT Treatment/Exercise - 08/20/18 0001      Exercises   Exercises  Neck      Neck Exercises: Seated   Neck Retraction  5 reps    Other Seated Exercise  scap retraction 5 sec X 5      Modalities   Modalities  Moist Heat;Electrical Stimulation      Moist Heat Therapy   Number Minutes Moist Heat  15 Minutes    Moist Heat Location  Cervical      Electrical Stimulation   Electrical Stimulation Location  neck/Lt UT    Electrical Stimulation Action  IFC    Electrical Stimulation Parameters  tolerance 15 min    Electrical Stimulation Goals  Tone;Pain      Neck Exercises: Stretches   Upper Trapezius Stretch  1 rep;Right;Left;30 seconds    Levator Stretch  Right;Left;1 rep;30 seconds             PT Education - 08/20/18 4562  Education Details  HEP, POC, TENS    Person(s) Educated  Patient    Methods  Explanation;Demonstration;Verbal cues;Handout    Comprehension  Verbalized understanding;Need further instruction          PT Long Term Goals - 08/20/18 0934      PT LONG TERM  GOAL #1   Title  decrease pain 50-75% or more with ADLs    Time  6    Period  Weeks    Status  New      PT LONG TERM GOAL #2   Title  increase ROM to [facility] to perform normal ADLS    Time  6    Period  Weeks    Status  New      PT LONG TERM GOAL #3   Title  independent with HEP    Time  6    Period  Weeks    Status  New      PT LONG TERM GOAL #4   Title  demo 4+/5 right shoulder strength    Time  6    Period  Weeks    Status  New             Plan - 08/20/18 4854    Clinical Impression Statement  Pt presents with chronic neck pain with radiculopathy, OA, and UT strain/tigger point. She has decreased ROM and spinal mobility, decreased strength, increased muscle tension and trigger points in upper traps, and increased pain limiting her full functional ablillites. She will benefit from skilled PT to address these deficits.     History and Personal Factors relevant to plan of care:  DM, OA, TKA, TSA, anx, dep, breast CA    Clinical Presentation  Evolving    Clinical Decision Making  Moderate    Rehab Potential  Fair    Clinical Impairments Affecting Rehab Potential  chronic nature of pain, anx, dep, DM, posture    PT Frequency  2x / week    PT Duration  6 weeks    PT Treatment/Interventions  ADLs/Self Care Home Management;Iontophoresis 4mg /ml Dexamethasone;Moist Heat;Traction;Ultrasound    PT Next Visit Plan  review HEP, it was emailed to her as med bridge was down so ask if she recieved it, modalites and MT PRN       Patient will benefit from skilled therapeutic intervention in order to improve the following deficits and impairments:  Decreased activity tolerance, Decreased range of motion, Decreased strength, Hypomobility, Increased muscle spasms, Postural dysfunction, Pain  Visit Diagnosis: Cervicalgia  Pain in thoracic spine     Problem List Patient Active Problem List   Diagnosis Date Noted  . Primary localized osteoarthrosis of right shoulder 03/21/2017  .  S/P shoulder replacement 02/19/2016  . Genetic testing 12/11/2015  . primary Osteoarthritis of left knee 01/23/2015  . Osteoarthritis of right knee 08/15/2014  . Primary cancer of lower outer quadrant of left female breast ([place]) 01/23/2012  . HYPERGLYCEMIA 05/03/2007  . HYPERPLASIA, ENDOMETRIAL NOS 02/22/2007  . DYSMENORRHEA 10/18/2006  . DEGENERATIVE JOINT DISEASE 10/18/2006  . INSOMNIA 10/18/2006  . CHEST PAIN 10/18/2006  . OBESITY 10/05/2006  . ANXIETY 10/05/2006  . HYPERTENSION 10/05/2006  . GASTROENTERITIS 10/05/2006  . MENORRHAGIA, PERIMENOPAUSAL 10/05/2006    Debbe Odea, PT, DPT 08/20/2018, 9:44 AM  [facility] [address] [place], [place], [address] Phone: [phone]   Fax:  [phone]  Name: Morgan Taylor MRN: [id number] Date of Birth: [birth year]

## 2018-08-29 DIAGNOSIS — C50919 Malignant neoplasm of unspecified site of unspecified female breast: Secondary | ICD-10-CM | POA: Insufficient documentation

## 2018-08-29 DIAGNOSIS — E119 Type 2 diabetes mellitus without complications: Secondary | ICD-10-CM | POA: Insufficient documentation

## 2018-08-29 DIAGNOSIS — A6000 Herpesviral infection of urogenital system, unspecified: Secondary | ICD-10-CM | POA: Insufficient documentation

## 2018-08-31 ENCOUNTER — Encounter

## 2018-09-03 ENCOUNTER — Ambulatory Visit: Payer: Medicare Other | Admitting: Physical Therapy

## 2018-09-07 ENCOUNTER — Ambulatory Visit: Payer: Medicare Other | Attending: Family Medicine | Admitting: Physical Therapy

## 2018-09-10 ENCOUNTER — Telehealth: Payer: Self-pay | Admitting: Physical Therapy

## 2018-09-10 ENCOUNTER — Encounter: Payer: Medicare Other | Admitting: Physical Therapy

## 2018-09-10 NOTE — Telephone Encounter (Signed)
Spoke to patient regarding no-show appointments. She is unable to attend due to transportation. She will call in the future to resume PT when she can. Will cancel appointments for now.

## 2018-09-14 ENCOUNTER — Encounter: Payer: Medicare Other | Admitting: Physical Therapy

## 2019-06-24 ENCOUNTER — Other Ambulatory Visit: Payer: Self-pay | Admitting: Internal Medicine

## 2019-06-24 DIAGNOSIS — Z1231 Encounter for screening mammogram for malignant neoplasm of breast: Secondary | ICD-10-CM

## 2019-08-07 ENCOUNTER — Other Ambulatory Visit: Payer: Self-pay

## 2019-08-07 ENCOUNTER — Ambulatory Visit
Admission: RE | Admit: 2019-08-07 | Discharge: 2019-08-07 | Disposition: A | Discharge status: Home / Self Care | Payer: Medicare Other | Source: Ambulatory Visit | Attending: Internal Medicine | Admitting: Internal Medicine

## 2019-08-07 DIAGNOSIS — Z1231 Encounter for screening mammogram for malignant neoplasm of breast: Secondary | ICD-10-CM

## 2020-07-02 ENCOUNTER — Other Ambulatory Visit: Payer: Self-pay | Admitting: Internal Medicine

## 2020-07-02 DIAGNOSIS — N644 Mastodynia: Secondary | ICD-10-CM

## 2020-07-10 ENCOUNTER — Ambulatory Visit
Admission: RE | Admit: 2020-07-10 | Discharge: 2020-07-10 | Disposition: A | Discharge status: Home / Self Care | Payer: Medicare Other | Source: Ambulatory Visit | Attending: Internal Medicine | Admitting: Internal Medicine

## 2020-07-10 ENCOUNTER — Other Ambulatory Visit: Payer: Self-pay

## 2020-07-10 DIAGNOSIS — N644 Mastodynia: Secondary | ICD-10-CM

## 2020-09-18 ENCOUNTER — Ambulatory Visit: Payer: Medicare Other | Admitting: Physical Therapy

## 2020-09-18 ENCOUNTER — Ambulatory Visit: Payer: BC Managed Care – PPO | Attending: Orthopaedic Surgery | Admitting: Physical Therapy

## 2020-09-18 ENCOUNTER — Other Ambulatory Visit: Payer: Self-pay

## 2020-09-18 ENCOUNTER — Encounter: Payer: Self-pay | Admitting: Physical Therapy

## 2020-09-18 DIAGNOSIS — R293 Abnormal posture: Secondary | ICD-10-CM | POA: Insufficient documentation

## 2020-09-18 DIAGNOSIS — M542 Cervicalgia: Secondary | ICD-10-CM | POA: Diagnosis present

## 2020-09-18 NOTE — Therapy (Signed)
[place], [place], [address] Phone: [phone]   Fax:  [phone]  Physical Therapy Evaluation  Patient Details  Name: Morgan Taylor MRN: [id number] Date of Birth: [birth year] Referring Provider (PT): Rennis Harding, MD   Encounter Date: 09/18/2020   PT End of Session - 09/18/20 1234    Visit Number 1    Number of Visits 12    Date for PT Re-Evaluation 10/30/20    Authorization Type UHC Medicare, BCBS, progress note by visit 10, recheck FOTO status by visit 6    PT Start Time 0932    PT Stop Time 1015    PT Time Calculation (min) 43 min    Activity Tolerance Patient tolerated treatment well    Behavior During Therapy [facility] for tasks assessed/performed           Past Medical History:  Diagnosis Date  . Anxiety   . Arthritis   . Breast cancer ([place])   . Cancer [facility]) 2009   lumpectomy  . Depression    takes Remeron nightly  . Diabetes mellitus    takes Lantus and Victoza. Average fasting blood sugar runs 89  . GERD (gastroesophageal reflux disease)    takes Omeprazole daily  . Hyperlipemia    takes Lipitor daily  . Hypertension    takes Amlodipine and Losartan daily  . Hypothyroidism    takes Synthroid daily  . Neck pain   . Numbness    in both hands  . Personal history of radiation therapy   . Primary localized osteoarthrosis of right shoulder 03/21/2017    Past Surgical History:  Procedure Laterality Date  . APPENDECTOMY    . BREAST BIOPSY    . BREAST LUMPECTOMY Left 2009  . CESAREAN SECTION    . COLONOSCOPY    . DILATION AND CURETTAGE OF UTERUS    . SHOULDER ARTHROSCOPY Left    cleaned out joint  . TOTAL KNEE ARTHROPLASTY Right 08/15/2014   Procedure: RIGHT TOTAL KNEE ARTHROPLASTY;  Surgeon: Yvette Rack., MD;  Location: [place];  Service: Orthopedics;  Laterality: Right;  . TOTAL KNEE ARTHROPLASTY Left 01/23/2015   Procedure: TOTAL KNEE ARTHROPLASTY;  Surgeon: Yvette Rack., MD;   Location: [place];  Service: Orthopedics;  Laterality: Left;  . TOTAL SHOULDER ARTHROPLASTY Left 02/19/2016   Procedure: LEFT TOTAL SHOULDER ARTHROPLASTY;  Surgeon: Marchia Bond, MD;  Location: [place];  Service: Orthopedics;  Laterality: Left;  . TOTAL SHOULDER ARTHROPLASTY Right 03/21/2017   Procedure: TOTAL SHOULDER ARTHROPLASTY;  Surgeon: Marchia Bond, MD;  Location: [place];  Service: Orthopedics;  Laterality: Right;    There were no vitals filed for this visit.    Subjective Assessment - 09/18/20 0941    Subjective Pt. is a 59 y/o female referred to PT for c/o chronic neck pain issues. She reports mutliple year history of symptoms at least for the past 2-3 years of insidious onset neck pain. Symptoms include local neck pain with intermittent radiating into left upper trapezius region. She does have a history of bilateral hand numbness and reports that intermittently noth of her arms and hands go numb and that she occasionally gets a "sharp" pain radiating from her shoulder to her neck where she "can't move" for a minute or so after. No bowel or bladder changes noted. She does have issues with ongoing left hip pain with underlying OA pending THA later this fall but other than gait difficulties from hip pain  no recent changes in gait/abnormalities noted. X-rays revealed degenerative changes, no MRI to date-needs to try PT first per insurance.    Pertinent History bilat TSA and TKA, chronic pain in pain management, diabetic, depression, breast CA s/p lumpectomy in 2009    Limitations Sitting;House hold activities;Lifting    Diagnostic tests X-rays    Patient Stated Goals Avoid neck surgery    Currently in Pain? Yes    Pain Score 3     Pain Location Neck    Pain Orientation Lower;Left;Right    Pain Descriptors / Indicators Dull   intermittently sharp   Pain Type Chronic pain    Pain Radiating Towards left upper trapezius region, reports also intermittent numbness in bilateral arms and hands     Pain Onset More than a month ago    Pain Frequency Constant    Aggravating Factors  cold and rainy weather    Pain Relieving Factors heat and pain medication              [facility] PT Assessment - 09/18/20 0001      Assessment   Medical Diagnosis Cervical spondylosis    Referring Provider (PT) Rennis Harding, MD    Onset Date/Surgical Date --   referral 08/25/20, symptoms chronic x 2-3 years   Hand Dominance Right    Prior Therapy past PT for neck briefly in 2019      Precautions   Precautions None      Restrictions   Weight Bearing Restrictions No      Balance Screen   Has the patient fallen in the past 6 months No      Prior Function   Vocation --   works for [employer] but currently on medical leave due to hip     Cognition   Overall Cognitive Status Within Functional Limits for tasks assessed      Observation/Other Assessments   Focus on Therapeutic Outcomes (FOTO)  48% limited      Sensation   Light Touch Appears Intact   C4-T1 dermatomes     Posture/Postural Control   Posture Comments rounded shoulders      Deep Tendon Reflexes   DTR Assessment Site Biceps;Triceps    Biceps DTR 2+    Triceps DTR 2+      ROM / Strength   AROM / PROM / Strength AROM;Strength      AROM   AROM Assessment Site Shoulder;Cervical    Right/Left Shoulder Right;Left    Right Shoulder Flexion 150 Degrees    Right Shoulder ABduction 160 Degrees    Right Shoulder Internal Rotation --   reach to T12   Right Shoulder External Rotation --   reach to T1   Left Shoulder Flexion 150 Degrees    Left Shoulder ABduction 170 Degrees    Left Shoulder Internal Rotation --   reach to T12   Left Shoulder External Rotation --   reach to T1   Cervical Flexion 45    Cervical Extension 40    Cervical - Right Side Bend 33    Cervical - Left Side Bend 32    Cervical - Right Rotation 78    Cervical - Left Rotation 67      Strength   Overall Strength Comments Right grip 33 lbs., left grip 35 lbs. with grip  dynamonometer, Bilateral shoulder flexion and abduction 4+/5,  right wrist flexion 4+/5 otherwise bilateral UE grossly 5/5      Palpation   Palpation comment Tightness/tenderness to  palpation in left upper trapezius region, increased neck pain with cervical PA assessment at C5-6 region      Special Tests   Other special tests Spurling's (-), unable to assess upper limb tension tests due to limited shoulder PROM in ER, local neck pain ease with distraction but no radicular symptoms at time of eval so unable to assess for centralization, Hoffman's and Babinski (-)                      Objective measurements completed on examination: See above findings.       [facility] Adult PT Treatment/Exercise - 09/18/20 0001      Exercises   Exercises --   HEP handout review                 PT Education - 09/18/20 1234    Education Details symptom etiology, HEP, POC    Person(s) Educated Patient    Methods Explanation;Demonstration;Verbal cues;Handout    Comprehension Verbalized understanding               PT Long Term Goals - 09/18/20 1246      PT LONG TERM GOAL #1   Title Independent with HEP    Baseline needs HEP    Time 6    Period Weeks    Status New    Target Date 10/30/20      PT LONG TERM GOAL #2   Title Improve FOTO outcome measure score to 42% or less impairment due to neck pain    Baseline 48% limited    Time 6    Period Weeks    Status New    Target Date 10/30/20      PT LONG TERM GOAL #3   Title Increase left cervical rotation AROM at least 5-10 deg to improve ability to turn head while driving    Baseline left 67 deg, right 78 deg    Time 6    Period Weeks    Status New    Target Date 10/30/20      PT LONG TERM GOAL #4   Title Return postural demos to help decrease neck pain to improve positional tolerance    Baseline would benefit from pt. education/review    Time 6    Period Weeks    Status New    Target Date 10/30/20      PT LONG  TERM GOAL #5   Title Decrease neck pain symptoms at least 40% from baseline status for improved tolerance for chores, sitting, reaching and llifting activities    Time 6    Period Weeks    Status New    Target Date 10/30/20                  Plan - 09/18/20 1236    Clinical Impression Statement Pt. presents with chronic neck pain issues with underlying degenerative changes/spondylosis. She does note pain radiating into left shoulder/upper trapezius region as well as intermittent bilateral arm and hand parasthesias-no special test or objective findings to correlate for radiculopathy or myelopathy but will continue to monitor. Plan trial PT to see if improvement can be obtained with conservative tx. efforts.    Personal Factors and Comorbidities Age;Comorbidity 3+;Time since onset of injury/illness/exacerbation;Past/Current Experience    Comorbidities diabetic, history of breast CA, OA with history bilateral TKA and TSA, depression, chronic pain    Examination-Activity Limitations Sit;Lift;Reach Overhead;Carry    Examination-Participation Restrictions Cleaning    Stability/Clinical Decision  Making Evolving/Moderate complexity    Clinical Decision Making Moderate    Rehab Potential Fair    Clinical Impairments Affecting Rehab Potential chronic nature of pain, anx, dep, DM, posture    PT Frequency 2x / week    PT Duration 6 weeks    PT Treatment/Interventions ADLs/Self Care Home Management;Iontophoresis 4mg /ml Dexamethasone;Moist Heat;Traction;Ultrasound;Cryotherapy;Electrical Stimulation;Therapeutic exercise;Patient/family education;Manual techniques;Dry needling;Therapeutic activities;Neuromuscular re-education;Taping    PT Next Visit Plan Review FOTO patient report by visit 3, work on deep neck flexor endurance and cervical/postural strengthening/stabiization, stretches left upper trapezius region, manual vs. potential trial mechanical traction, potential dry needling prn to left upper  trapezius region    PT Home Exercise Plan Access code: [email or web address]    Consulted and Agree with Plan of Care Patient           Patient will benefit from skilled therapeutic intervention in order to improve the following deficits and impairments:  Decreased activity tolerance, Decreased range of motion, Decreased strength, Hypomobility, Increased muscle spasms, Postural dysfunction, Pain, Impaired flexibility  Visit Diagnosis: Cervicalgia  Abnormal posture     Problem List Patient Active Problem List   Diagnosis Date Noted  . Primary localized osteoarthrosis of right shoulder 03/21/2017  . S/P shoulder replacement 02/19/2016  . Genetic testing 12/11/2015  . primary Osteoarthritis of left knee 01/23/2015  . Osteoarthritis of right knee 08/15/2014  . Primary cancer of lower outer quadrant of left female breast ([place]) 01/23/2012  . HYPERGLYCEMIA 05/03/2007  . HYPERPLASIA, ENDOMETRIAL NOS 02/22/2007  . DYSMENORRHEA 10/18/2006  . DEGENERATIVE JOINT DISEASE 10/18/2006  . INSOMNIA 10/18/2006  . CHEST PAIN 10/18/2006  . OBESITY 10/05/2006  . ANXIETY 10/05/2006  . HYPERTENSION 10/05/2006  . GASTROENTERITIS 10/05/2006  . MENORRHAGIA, PERIMENOPAUSAL 10/05/2006    Beaulah Dinning, PT, DPT 09/18/20 12:52 PM  [facility] [address] [place], [place], [address] Phone: [phone]   Fax:  [phone]  Name: Morgan Taylor MRN: [id number] Date of Birth: Dec 07, 1961

## 2020-09-21 ENCOUNTER — Encounter: Payer: Self-pay | Admitting: Physical Therapy

## 2020-09-21 ENCOUNTER — Ambulatory Visit: Payer: BC Managed Care – PPO | Admitting: Physical Therapy

## 2020-09-21 ENCOUNTER — Other Ambulatory Visit: Payer: Self-pay

## 2020-09-21 DIAGNOSIS — R293 Abnormal posture: Secondary | ICD-10-CM

## 2020-09-21 DIAGNOSIS — M542 Cervicalgia: Secondary | ICD-10-CM

## 2020-09-22 ENCOUNTER — Encounter: Payer: Self-pay | Admitting: Physical Therapy

## 2020-09-22 NOTE — Therapy (Signed)
[place], [place], [address] Phone: [phone]   Fax:  [phone]  Physical Therapy Treatment  Patient Details  Name: Morgan Taylor MRN: [id number] Date of Birth: 01-23-1961 Referring Provider (PT): Rennis Harding, MD   Encounter Date: 09/21/2020   PT End of Session - 09/22/20 1036    Visit Number 2    Number of Visits 12    Date for PT Re-Evaluation 10/30/20    Authorization Type UHC Medicare, BCBS, progress note by visit 10, recheck FOTO status by visit 6    PT Start Time 1415    PT Stop Time 1456    PT Time Calculation (min) 41 min    Activity Tolerance Patient tolerated treatment well    Behavior During Therapy [facility] for tasks assessed/performed           Past Medical History:  Diagnosis Date  . Anxiety   . Arthritis   . Breast cancer ([place])   . Cancer [facility]) 2009   lumpectomy  . Depression    takes Remeron nightly  . Diabetes mellitus    takes Lantus and Victoza. Average fasting blood sugar runs 89  . GERD (gastroesophageal reflux disease)    takes Omeprazole daily  . Hyperlipemia    takes Lipitor daily  . Hypertension    takes Amlodipine and Losartan daily  . Hypothyroidism    takes Synthroid daily  . Neck pain   . Numbness    in both hands  . Personal history of radiation therapy   . Primary localized osteoarthrosis of right shoulder 03/21/2017    Past Surgical History:  Procedure Laterality Date  . APPENDECTOMY    . BREAST BIOPSY    . BREAST LUMPECTOMY Left 2009  . CESAREAN SECTION    . COLONOSCOPY    . DILATION AND CURETTAGE OF UTERUS    . SHOULDER ARTHROSCOPY Left    cleaned out joint  . TOTAL KNEE ARTHROPLASTY Right 08/15/2014   Procedure: RIGHT TOTAL KNEE ARTHROPLASTY;  Surgeon: Yvette Rack., MD;  Location: [place];  Service: Orthopedics;  Laterality: Right;  . TOTAL KNEE ARTHROPLASTY Left 01/23/2015   Procedure: TOTAL KNEE ARTHROPLASTY;  Surgeon: Yvette Rack., MD;   Location: [place];  Service: Orthopedics;  Laterality: Left;  . TOTAL SHOULDER ARTHROPLASTY Left 02/19/2016   Procedure: LEFT TOTAL SHOULDER ARTHROPLASTY;  Surgeon: Marchia Bond, MD;  Location: [place];  Service: Orthopedics;  Laterality: Left;  . TOTAL SHOULDER ARTHROPLASTY Right 03/21/2017   Procedure: TOTAL SHOULDER ARTHROPLASTY;  Surgeon: Marchia Bond, MD;  Location: [place];  Service: Orthopedics;  Laterality: Right;    There were no vitals filed for this visit.   Subjective Assessment - 09/21/20 1529    Subjective Patient reports her neck may be a little looser but she had a bad day a feww days ago. She continues to have numbness into her hands. She has been working on her stretches and ther-ex at home.    Pertinent History bilat TSA and TKA, chronic pain in pain management, diabetic, depression, breast CA s/p lumpectomy in 2009    Limitations Sitting;House hold activities;Lifting    Diagnostic tests X-rays    Patient Stated Goals Avoid neck surgery    Currently in Pain? Yes    Pain Score 3     Pain Location Neck    Pain Orientation Right;Left    Pain Descriptors / Indicators Aching    Pain Type Chronic pain  Pain Radiating Towards left upper trap region    Pain Onset More than a month ago    Pain Frequency Constant    Aggravating Factors  cold and windy weather    Pain Relieving Factors heat and medication                             [facility] Adult PT Treatment/Exercise - 09/22/20 0001      Exercises   Exercises Neck      Neck Exercises: Standing   Other Standing Exercises scap retraction x20 red; shoulder extension x20 red       Neck Exercises: Seated   Other Seated Exercise reviewed cervical pain free ROM in all planes       Manual Therapy   Manual Therapy Joint mobilization;Soft tissue mobilization;Manual Traction    Joint Mobilization PA mobilizations in supine to C6-C7     Soft tissue mobilization to upper trap and cervical spine     Manual Traction  sub-occipital release; gentle manual traction       Neck Exercises: Stretches   Upper Trapezius Stretch 2 reps;20 seconds    Levator Stretch 2 reps;20 seconds                  PT Education - 09/21/20 1531    Education Details reviewed benefits and risk of TPDN. patient given sheet    Person(s) Educated Patient    Methods Tactile cues;Demonstration;Verbal cues;Explanation    Comprehension Verbalized understanding;Returned demonstration;Verbal cues required;Tactile cues required               PT Long Term Goals - 09/18/20 1246      PT LONG TERM GOAL #1   Title Independent with HEP    Baseline needs HEP    Time 6    Period Weeks    Status New    Target Date 10/30/20      PT LONG TERM GOAL #2   Title Improve FOTO outcome measure score to 42% or less impairment due to neck pain    Baseline 48% limited    Time 6    Period Weeks    Status New    Target Date 10/30/20      PT LONG TERM GOAL #3   Title Increase left cervical rotation AROM at least 5-10 deg to improve ability to turn head while driving    Baseline left 67 deg, right 78 deg    Time 6    Period Weeks    Status New    Target Date 10/30/20      PT LONG TERM GOAL #4   Title Return postural demos to help decrease neck pain to improve positional tolerance    Baseline would benefit from pt. education/review    Time 6    Period Weeks    Status New    Target Date 10/30/20      PT LONG TERM GOAL #5   Title Decrease neck pain symptoms at least 40% from baseline status for improved tolerance for chores, sitting, reaching and llifting activities    Time 6    Period Weeks    Status New    Target Date 10/30/20                 Plan - 09/22/20 1037    Clinical Impression Statement Patient tolerated treatment well. Therapy reviewed stretching and strengthening for home program with the patient. She was also  shown how to use the thera-cane. She report intemittent numbness. She was advised to look for  decreased frequency and intesnity of the numbness and increased movement following treatment. She declined needling today but may be open to it in the future.    Personal Factors and Comorbidities Age;Comorbidity 3+;Time since onset of injury/illness/exacerbation;Past/Current Experience    Comorbidities diabetic, history of breast CA, OA with history bilateral TKA and TSA, depression, chronic pain    Examination-Activity Limitations Sit;Lift;Reach Overhead;Carry    Examination-Participation Restrictions Cleaning    Stability/Clinical Decision Making Evolving/Moderate complexity    Clinical Decision Making Moderate    Rehab Potential Fair    Clinical Impairments Affecting Rehab Potential chronic nature of pain, anx, dep, DM, posture    PT Frequency 2x / week    PT Duration 6 weeks    PT Treatment/Interventions ADLs/Self Care Home Management;Iontophoresis 4mg /ml Dexamethasone;Moist Heat;Traction;Ultrasound;Cryotherapy;Electrical Stimulation;Therapeutic exercise;Patient/family education;Manual techniques;Dry needling;Therapeutic activities;Neuromuscular re-education;Taping    PT Next Visit Plan review FOTO, review tolerance to ther-ex and stretching; consder adding seated bilateral ER horizontal abduction and flexion    PT Home Exercise Plan Access code: [email or web address]    Consulted and Agree with Plan of Care Patient           Patient will benefit from skilled therapeutic intervention in order to improve the following deficits and impairments:  Decreased activity tolerance, Decreased range of motion, Decreased strength, Hypomobility, Increased muscle spasms, Postural dysfunction, Pain, Impaired flexibility  Visit Diagnosis: Cervicalgia  Abnormal posture     Problem List Patient Active Problem List   Diagnosis Date Noted  . Primary localized osteoarthrosis of right shoulder 03/21/2017  . S/P shoulder replacement 02/19/2016  . Genetic testing 12/11/2015  . primary Osteoarthritis of left knee  01/23/2015  . Osteoarthritis of right knee 08/15/2014  . Primary cancer of lower outer quadrant of left female breast ([place]) 01/23/2012  . HYPERGLYCEMIA 05/03/2007  . HYPERPLASIA, ENDOMETRIAL NOS 02/22/2007  . DYSMENORRHEA 10/18/2006  . DEGENERATIVE JOINT DISEASE 10/18/2006  . INSOMNIA 10/18/2006  . CHEST PAIN 10/18/2006  . OBESITY 10/05/2006  . ANXIETY 10/05/2006  . HYPERTENSION 10/05/2006  . GASTROENTERITIS 10/05/2006  . MENORRHAGIA, PERIMENOPAUSAL 10/05/2006    Carney Living  PT DPT  09/22/2020, 10:48 AM  [facility] [address] [place], [place], [address] Phone: [phone]   Fax:  [phone]  Name: Morgan Taylor MRN: [id number] Date of Birth: [birth year]

## 2020-09-23 ENCOUNTER — Other Ambulatory Visit: Payer: Self-pay

## 2020-09-23 ENCOUNTER — Encounter: Payer: Self-pay | Admitting: Physical Therapy

## 2020-09-23 ENCOUNTER — Ambulatory Visit: Payer: BC Managed Care – PPO | Admitting: Physical Therapy

## 2020-09-23 DIAGNOSIS — M542 Cervicalgia: Secondary | ICD-10-CM | POA: Diagnosis not present

## 2020-09-23 DIAGNOSIS — R293 Abnormal posture: Secondary | ICD-10-CM

## 2020-09-23 NOTE — Therapy (Signed)
[place], [place], [address] Phone: [phone]   Fax:  [phone]  Physical Therapy Treatment  Patient Details  Name: Morgan Taylor MRN: [id number] Date of Birth: August 27, 1961 Referring Provider (PT): Rennis Harding, MD   Encounter Date: 09/23/2020   PT End of Session - 09/23/20 1433    Visit Number 3    Number of Visits 12    Date for PT Re-Evaluation 10/30/20    Authorization Type UHC Medicare, BCBS, progress note by visit 10, recheck FOTO status by visit 6    PT Start Time 1330    PT Stop Time 1412    PT Time Calculation (min) 42 min    Activity Tolerance Patient tolerated treatment well    Behavior During Therapy [facility] for tasks assessed/performed           Past Medical History:  Diagnosis Date  . Anxiety   . Arthritis   . Breast cancer ([place])   . Cancer [facility]) 2009   lumpectomy  . Depression    takes Remeron nightly  . Diabetes mellitus    takes Lantus and Victoza. Average fasting blood sugar runs 89  . GERD (gastroesophageal reflux disease)    takes Omeprazole daily  . Hyperlipemia    takes Lipitor daily  . Hypertension    takes Amlodipine and Losartan daily  . Hypothyroidism    takes Synthroid daily  . Neck pain   . Numbness    in both hands  . Personal history of radiation therapy   . Primary localized osteoarthrosis of right shoulder 03/21/2017    Past Surgical History:  Procedure Laterality Date  . APPENDECTOMY    . BREAST BIOPSY    . BREAST LUMPECTOMY Left 2009  . CESAREAN SECTION    . COLONOSCOPY    . DILATION AND CURETTAGE OF UTERUS    . SHOULDER ARTHROSCOPY Left    cleaned out joint  . TOTAL KNEE ARTHROPLASTY Right 08/15/2014   Procedure: RIGHT TOTAL KNEE ARTHROPLASTY;  Surgeon: Yvette Rack., MD;  Location: [place];  Service: Orthopedics;  Laterality: Right;  . TOTAL KNEE ARTHROPLASTY Left 01/23/2015   Procedure: TOTAL KNEE ARTHROPLASTY;  Surgeon: Yvette Rack., MD;   Location: [place];  Service: Orthopedics;  Laterality: Left;  . TOTAL SHOULDER ARTHROPLASTY Left 02/19/2016   Procedure: LEFT TOTAL SHOULDER ARTHROPLASTY;  Surgeon: Marchia Bond, MD;  Location: [place];  Service: Orthopedics;  Laterality: Left;  . TOTAL SHOULDER ARTHROPLASTY Right 03/21/2017   Procedure: TOTAL SHOULDER ARTHROPLASTY;  Surgeon: Marchia Bond, MD;  Location: [place];  Service: Orthopedics;  Laterality: Right;    There were no vitals filed for this visit.   Subjective Assessment - 09/23/20 1341    Subjective Patient reports she continues to have pain/ her arm whnt numb today and she wans't    Pertinent History bilat TSA and TKA, chronic pain in pain management, diabetic, depression, breast CA s/p lumpectomy in 2009    Limitations Sitting;House hold activities;Lifting    Diagnostic tests X-rays    Patient Stated Goals Avoid neck surgery    Currently in Pain? Yes    Pain Score 3     Pain Location Neck    Pain Orientation Right    Pain Descriptors / Indicators Aching    Pain Type Chronic pain    Pain Radiating Towards left upper trap    Pain Onset More than a month ago    Pain Frequency Constant  Aggravating Factors  cool weather    Pain Relieving Factors heat and medication                             [facility] Adult PT Treatment/Exercise - 09/23/20 0001      Neck Exercises: Seated   Other Seated Exercise bilateral er 2x10 red; horizontal abduction 2x10 red; shoulder flexion 2x10 red;       Manual Therapy   Manual Therapy Joint mobilization;Soft tissue mobilization;Manual Traction    Joint Mobilization PA mobilizations in supine to C6-C7 / added prone throacic PA glides     Soft tissue mobilization to upper trap and cervical spine ; periscapular trigger point release     Manual Traction sub-occipital release; gentle manual traction       Neck Exercises: Stretches   Upper Trapezius Stretch 2 reps;20 seconds    Levator Stretch 2 reps;20 seconds                   PT Education - 09/23/20 1433    Education Details updated exzercises    Person(s) Educated Patient    Methods Explanation;Demonstration;Tactile cues;Verbal cues    Comprehension Verbalized understanding;Returned demonstration;Verbal cues required;Tactile cues required               PT Long Term Goals - 09/18/20 1246      PT LONG TERM GOAL #1   Title Independent with HEP    Baseline needs HEP    Time 6    Period Weeks    Status New    Target Date 10/30/20      PT LONG TERM GOAL #2   Title Improve FOTO outcome measure score to 42% or less impairment due to neck pain    Baseline 48% limited    Time 6    Period Weeks    Status New    Target Date 10/30/20      PT LONG TERM GOAL #3   Title Increase left cervical rotation AROM at least 5-10 deg to improve ability to turn head while driving    Baseline left 67 deg, right 78 deg    Time 6    Period Weeks    Status New    Target Date 10/30/20      PT LONG TERM GOAL #4   Title Return postural demos to help decrease neck pain to improve positional tolerance    Baseline would benefit from pt. education/review    Time 6    Period Weeks    Status New    Target Date 10/30/20      PT LONG TERM GOAL #5   Title Decrease neck pain symptoms at least 40% from baseline status for improved tolerance for chores, sitting, reaching and llifting activities    Time 6    Period Weeks    Status New    Target Date 10/30/20                 Plan - 09/23/20 1434    Clinical Impression Statement Patient continues to have pspasming in her left upper trap and into her leftcervical paraspinals. Therapy added gross thoracic mobilzation. She has an increased kyphosis. It may help her to be able to perfrom her exercises more effectively. She has tenderness to palpation of the left upper trap but was able to tolerate manual therapy. Therapy will continue to sadvance ther-ex as tolerated. Patient reported theat the numbness she  had earlier was the only numbness she has felt since the last visit.    Personal Factors and Comorbidities Age;Comorbidity 3+;Time since onset of injury/illness/exacerbation;Past/Current Experience    Comorbidities diabetic, history of breast CA, OA with history bilateral TKA and TSA, depression, chronic pain    Examination-Activity Limitations Sit;Lift;Reach Overhead;Carry    Examination-Participation Restrictions Cleaning    Stability/Clinical Decision Making Evolving/Moderate complexity    Clinical Decision Making Moderate    Rehab Potential Fair    Clinical Impairments Affecting Rehab Potential chronic nature of pain, anx, dep, DM, posture    PT Frequency 2x / week    PT Duration 6 weeks    PT Treatment/Interventions ADLs/Self Care Home Management;Iontophoresis 4mg /ml Dexamethasone;Moist Heat;Traction;Ultrasound;Cryotherapy;Electrical Stimulation;Therapeutic exercise;Patient/family education;Manual techniques;Dry needling;Therapeutic activities;Neuromuscular re-education;Taping    PT Next Visit Plan assess tolerance to HEP and manual therapy    PT Home Exercise Plan Access code: [email or web address]    Consulted and Agree with Plan of Care Patient           Patient will benefit from skilled therapeutic intervention in order to improve the following deficits and impairments:  Decreased activity tolerance, Decreased range of motion, Decreased strength, Hypomobility, Increased muscle spasms, Postural dysfunction, Pain, Impaired flexibility  Visit Diagnosis: Cervicalgia  Abnormal posture     Problem List Patient Active Problem List   Diagnosis Date Noted  . Primary localized osteoarthrosis of right shoulder 03/21/2017  . S/P shoulder replacement 02/19/2016  . Genetic testing 12/11/2015  . primary Osteoarthritis of left knee 01/23/2015  . Osteoarthritis of right knee 08/15/2014  . Primary cancer of lower outer quadrant of left female breast ([place]) 01/23/2012  . HYPERGLYCEMIA 05/03/2007  .  HYPERPLASIA, ENDOMETRIAL NOS 02/22/2007  . DYSMENORRHEA 10/18/2006  . DEGENERATIVE JOINT DISEASE 10/18/2006  . INSOMNIA 10/18/2006  . CHEST PAIN 10/18/2006  . OBESITY 10/05/2006  . ANXIETY 10/05/2006  . HYPERTENSION 10/05/2006  . GASTROENTERITIS 10/05/2006  . MENORRHAGIA, PERIMENOPAUSAL 10/05/2006    Carney Living PT DPT  09/23/2020, 2:42 PM  [facility] [address] [place], [place], [address] Phone: [phone]   Fax:  [phone]  Name: JAZLYNE GAUGER MRN: [id number] Date of Birth: May 22, 1961

## 2020-09-28 ENCOUNTER — Ambulatory Visit: Payer: BC Managed Care – PPO | Attending: Orthopaedic Surgery | Admitting: Physical Therapy

## 2020-09-28 ENCOUNTER — Telehealth: Payer: Self-pay | Admitting: Physical Therapy

## 2020-09-28 DIAGNOSIS — M542 Cervicalgia: Secondary | ICD-10-CM | POA: Insufficient documentation

## 2020-09-28 DIAGNOSIS — R293 Abnormal posture: Secondary | ICD-10-CM | POA: Insufficient documentation

## 2020-09-28 NOTE — Telephone Encounter (Signed)
Patient contacted regarding no-show appointment. Patient advised of our attendance policy. She was advised of her next appointment and asked to call if she can not make it.

## 2020-09-30 ENCOUNTER — Other Ambulatory Visit: Payer: Self-pay

## 2020-09-30 ENCOUNTER — Encounter: Payer: Self-pay | Admitting: Physical Therapy

## 2020-09-30 ENCOUNTER — Ambulatory Visit: Payer: BC Managed Care – PPO | Admitting: Physical Therapy

## 2020-09-30 DIAGNOSIS — R293 Abnormal posture: Secondary | ICD-10-CM | POA: Diagnosis present

## 2020-09-30 DIAGNOSIS — M542 Cervicalgia: Secondary | ICD-10-CM

## 2020-10-01 ENCOUNTER — Encounter: Payer: Self-pay | Admitting: Physical Therapy

## 2020-10-01 NOTE — Therapy (Signed)
[place], [place], [address] Phone: [phone]   Fax:  [phone]  Physical Therapy Treatment  Patient Details  Name: Morgan Taylor MRN: [id number] Date of Birth: 1961-04-25 Referring Provider (PT): Rennis Harding, MD   Encounter Date: 09/30/2020   PT End of Session - 09/30/20 1351    Visit Number 4    Number of Visits 12    Date for PT Re-Evaluation 10/30/20    Authorization Type UHC Medicare, BCBS, progress note by visit 10, recheck FOTO status by visit 6    PT Start Time 1325    PT Stop Time 1405    PT Time Calculation (min) 40 min    Activity Tolerance Patient tolerated treatment well    Behavior During Therapy [facility] for tasks assessed/performed           Past Medical History:  Diagnosis Date  . Anxiety   . Arthritis   . Breast cancer ([place])   . Cancer [facility]) 2009   lumpectomy  . Depression    takes Remeron nightly  . Diabetes mellitus    takes Lantus and Victoza. Average fasting blood sugar runs 89  . GERD (gastroesophageal reflux disease)    takes Omeprazole daily  . Hyperlipemia    takes Lipitor daily  . Hypertension    takes Amlodipine and Losartan daily  . Hypothyroidism    takes Synthroid daily  . Neck pain   . Numbness    in both hands  . Personal history of radiation therapy   . Primary localized osteoarthrosis of right shoulder 03/21/2017    Past Surgical History:  Procedure Laterality Date  . APPENDECTOMY    . BREAST BIOPSY    . BREAST LUMPECTOMY Left 2009  . CESAREAN SECTION    . COLONOSCOPY    . DILATION AND CURETTAGE OF UTERUS    . SHOULDER ARTHROSCOPY Left    cleaned out joint  . TOTAL KNEE ARTHROPLASTY Right 08/15/2014   Procedure: RIGHT TOTAL KNEE ARTHROPLASTY;  Surgeon: Yvette Rack., MD;  Location: [place];  Service: Orthopedics;  Laterality: Right;  . TOTAL KNEE ARTHROPLASTY Left 01/23/2015   Procedure: TOTAL KNEE ARTHROPLASTY;  Surgeon: Yvette Rack., MD;   Location: [place];  Service: Orthopedics;  Laterality: Left;  . TOTAL SHOULDER ARTHROPLASTY Left 02/19/2016   Procedure: LEFT TOTAL SHOULDER ARTHROPLASTY;  Surgeon: Marchia Bond, MD;  Location: [place];  Service: Orthopedics;  Laterality: Left;  . TOTAL SHOULDER ARTHROPLASTY Right 03/21/2017   Procedure: TOTAL SHOULDER ARTHROPLASTY;  Surgeon: Marchia Bond, MD;  Location: [place];  Service: Orthopedics;  Laterality: Right;    There were no vitals filed for this visit.   Subjective Assessment - 09/30/20 1329    Subjective Patient reports she has been having increased heaches over the past few days. She has had a heaache every day.    Pertinent History bilat TSA and TKA, chronic pain in pain management, diabetic, depression, breast CA s/p lumpectomy in 2009    Limitations Sitting;House hold activities;Lifting    Diagnostic tests X-rays    Patient Stated Goals Avoid neck surgery    Currently in Pain? Yes    Pain Score 5     Pain Location Neck    Pain Orientation Right    Pain Descriptors / Indicators Aching    Pain Type Chronic pain    Pain Onset More than a month ago    Pain Frequency Constant    Aggravating  Factors  positioning    Pain Relieving Factors rest                             [facility] Adult PT Treatment/Exercise - 10/01/20 0001      Self-Care   Self-Care Other Self-Care Comments    Other Self-Care Comments  reviewed use of thera-cane and where to purchase one      Neck Exercises: Standing   Other Standing Exercises scap retraction x20 red; shoulder extension x20 red       Neck Exercises: Seated   Other Seated Exercise bilateral er 2x10 red; horizontal abduction 2x10 red; shoulder flexion 2x10 red;       Manual Therapy   Manual Therapy Joint mobilization;Soft tissue mobilization;Manual Traction    Joint Mobilization PA mobilizations in supine to C6-C7 / added prone throacic PA glides     Soft tissue mobilization to upper trap and cervical spine ;  periscapular trigger point release     Manual Traction sub-occipital release; gentle manual traction       Neck Exercises: Stretches   Upper Trapezius Stretch 2 reps;20 seconds    Levator Stretch 2 reps;20 seconds                  PT Education - 09/30/20 1350    Education Details reviewed exercises.    Person(s) Educated Patient    Methods Explanation;Demonstration;Tactile cues;Verbal cues    Comprehension Returned demonstration;Verbal cues required;Tactile cues required;Verbalized understanding               PT Long Term Goals - 09/18/20 1246      PT LONG TERM GOAL #1   Title Independent with HEP    Baseline needs HEP    Time 6    Period Weeks    Status New    Target Date 10/30/20      PT LONG TERM GOAL #2   Title Improve FOTO outcome measure score to 42% or less impairment due to neck pain    Baseline 48% limited    Time 6    Period Weeks    Status New    Target Date 10/30/20      PT LONG TERM GOAL #3   Title Increase left cervical rotation AROM at least 5-10 deg to improve ability to turn head while driving    Baseline left 67 deg, right 78 deg    Time 6    Period Weeks    Status New    Target Date 10/30/20      PT LONG TERM GOAL #4   Title Return postural demos to help decrease neck pain to improve positional tolerance    Baseline would benefit from pt. education/review    Time 6    Period Weeks    Status New    Target Date 10/30/20      PT LONG TERM GOAL #5   Title Decrease neck pain symptoms at least 40% from baseline status for improved tolerance for chores, sitting, reaching and llifting activities    Time 6    Period Weeks    Status New    Target Date 10/30/20                 Plan - 09/30/20 1352    Clinical Impression Statement Patient continues to have tightness in her upper trap. She had improved tightness and movement with manual therapy but her improviments have been trnasient.  She feels like some of her pain and headaches  have increased with stresss. Therapy reviwed deep breathing and shoulder relaxation. She declined trigger point dry needling. She is not comfortable with needles. She was advised if things continue to get worse with headaches and numbness then she will likley be sent back to her MD for further evalaution. SWe have 2 more visits until her next MD appointment. She wasdvised to be consistent and persitent with her    Personal Factors and Comorbidities Age;Comorbidity 3+;Time since onset of injury/illness/exacerbation;Past/Current Experience    Comorbidities diabetic, history of breast CA, OA with history bilateral TKA and TSA, depression, chronic pain    Examination-Activity Limitations Sit;Lift;Reach Overhead;Carry    Examination-Participation Restrictions Cleaning           Patient will benefit from skilled therapeutic intervention in order to improve the following deficits and impairments:  Decreased activity tolerance, Decreased range of motion, Decreased strength, Hypomobility, Increased muscle spasms, Postural dysfunction, Pain, Impaired flexibility  Visit Diagnosis: Cervicalgia  Abnormal posture     Problem List Patient Active Problem List   Diagnosis Date Noted  . Primary localized osteoarthrosis of right shoulder 03/21/2017  . S/P shoulder replacement 02/19/2016  . Genetic testing 12/11/2015  . primary Osteoarthritis of left knee 01/23/2015  . Osteoarthritis of right knee 08/15/2014  . Primary cancer of lower outer quadrant of left female breast ([place]) 01/23/2012  . HYPERGLYCEMIA 05/03/2007  . HYPERPLASIA, ENDOMETRIAL NOS 02/22/2007  . DYSMENORRHEA 10/18/2006  . DEGENERATIVE JOINT DISEASE 10/18/2006  . INSOMNIA 10/18/2006  . CHEST PAIN 10/18/2006  . OBESITY 10/05/2006  . ANXIETY 10/05/2006  . HYPERTENSION 10/05/2006  . GASTROENTERITIS 10/05/2006  . MENORRHAGIA, PERIMENOPAUSAL 10/05/2006    Carney Living PT DPT  10/01/2020, 12:08 PM  [facility] [address] [place], [place], [address] Phone: [phone]   Fax:  [phone]  Name: Morgan Taylor MRN: [id number] Date of Birth: 05-08-1961

## 2020-10-02 ENCOUNTER — Encounter: Payer: Self-pay | Admitting: Physical Therapy

## 2020-10-02 ENCOUNTER — Other Ambulatory Visit: Payer: Self-pay

## 2020-10-02 ENCOUNTER — Ambulatory Visit: Payer: BC Managed Care – PPO | Admitting: Physical Therapy

## 2020-10-02 DIAGNOSIS — M542 Cervicalgia: Secondary | ICD-10-CM

## 2020-10-02 DIAGNOSIS — R293 Abnormal posture: Secondary | ICD-10-CM | POA: Diagnosis not present

## 2020-10-02 NOTE — Therapy (Signed)
[place], [place], [address] Phone: [phone]   Fax:  [phone]  Physical Therapy Treatment  Patient Details  Name: Morgan Taylor MRN: [id number] Date of Birth: Sep 24, 1961 Referring Provider (PT): Rennis Harding, MD   Encounter Date: 10/02/2020   PT End of Session - 10/02/20 1105    Visit Number 5    Number of Visits 12    Date for PT Re-Evaluation 10/30/20    Authorization Type UHC Medicare, BCBS, progress note by visit 10, recheck FOTO status by visit 10 (status done 5th visit)    PT Start Time 1100    PT Stop Time 1143    PT Time Calculation (min) 43 min           Past Medical History:  Diagnosis Date  . Anxiety   . Arthritis   . Breast cancer ([place])   . Cancer [facility]) 2009   lumpectomy  . Depression    takes Remeron nightly  . Diabetes mellitus    takes Lantus and Victoza. Average fasting blood sugar runs 89  . GERD (gastroesophageal reflux disease)    takes Omeprazole daily  . Hyperlipemia    takes Lipitor daily  . Hypertension    takes Amlodipine and Losartan daily  . Hypothyroidism    takes Synthroid daily  . Neck pain   . Numbness    in both hands  . Personal history of radiation therapy   . Primary localized osteoarthrosis of right shoulder 03/21/2017    Past Surgical History:  Procedure Laterality Date  . APPENDECTOMY    . BREAST BIOPSY    . BREAST LUMPECTOMY Left 2009  . CESAREAN SECTION    . COLONOSCOPY    . DILATION AND CURETTAGE OF UTERUS    . SHOULDER ARTHROSCOPY Left    cleaned out joint  . TOTAL KNEE ARTHROPLASTY Right 08/15/2014   Procedure: RIGHT TOTAL KNEE ARTHROPLASTY;  Surgeon: Yvette Rack., MD;  Location: [place];  Service: Orthopedics;  Laterality: Right;  . TOTAL KNEE ARTHROPLASTY Left 01/23/2015   Procedure: TOTAL KNEE ARTHROPLASTY;  Surgeon: Yvette Rack., MD;  Location: [place];  Service: Orthopedics;  Laterality: Left;  . TOTAL SHOULDER ARTHROPLASTY Left  02/19/2016   Procedure: LEFT TOTAL SHOULDER ARTHROPLASTY;  Surgeon: Marchia Bond, MD;  Location: [place];  Service: Orthopedics;  Laterality: Left;  . TOTAL SHOULDER ARTHROPLASTY Right 03/21/2017   Procedure: TOTAL SHOULDER ARTHROPLASTY;  Surgeon: Marchia Bond, MD;  Location: [place];  Service: Orthopedics;  Laterality: Right;    There were no vitals filed for this visit.   Subjective Assessment - 10/02/20 1104    Subjective Not as achey as I would be on a rainy day.    Pain Location Neck              [facility] PT Assessment - 10/02/20 0001      Observation/Other Assessments   Focus on Therapeutic Outcomes (FOTO)  41% limited       AROM   Cervical - Right Rotation 62    Cervical - Left Rotation 52                         [facility] Adult PT Treatment/Exercise - 10/02/20 0001      Neck Exercises: Machines for Strengthening   UBE (Upper Arm Bike) L1 x 2 minutes each way       Neck Exercises: Standing   Other  Standing Exercises scap retraction x20 red; shoulder extension x20 red       Neck Exercises: Seated   Other Seated Exercise bilateral er 2x10 red; horizontal abduction 2x10 red; shoulder flexion 2x10 red;       Neck Exercises: Stretches   Upper Trapezius Stretch 2 reps;20 seconds    Levator Stretch 2 reps;20 seconds                       PT Long Term Goals - 10/02/20 1128      PT LONG TERM GOAL #1   Title Independent with HEP    Time 6    Period Weeks    Status On-going      PT LONG TERM GOAL #2   Title Improve FOTO outcome measure score to 42% or less impairment due to neck pain    Baseline 41% limited    Time 6    Period Weeks    Status Achieved      PT LONG TERM GOAL #3   Title Increase left cervical rotation AROM at least 5-10 deg to improve ability to turn head while driving    Baseline see objective meaures, she reports improvement in ease with driving    Time 6    Period Weeks    Status Partially Met      PT LONG TERM GOAL #4    Title Return postural demos to help decrease neck pain to improve positional tolerance    Baseline she reports being more mindful and can return demo in clinic    Time 6    Period Weeks    Status Achieved      PT LONG TERM GOAL #5   Title Decrease neck pain symptoms at least 40% from baseline status for improved tolerance for chores, sitting, reaching and llifting activities    Baseline plans to do household chores this weekend, will asses tolerance    Time 6    Period Weeks    Status On-going                 Plan - 10/02/20 1111    Clinical Impression Statement Pt arrives reporting improvement in pain. She reports improved ROM for ease with driving. FOTO score improved better than predicted. Reviewed her postural exercises with good tolerance. She is considering TPDN next session. Will see MD Tuesday.    PT Next Visit Plan asses her tolerance to household chores over the weekend. 1 more viist prior to MD visit, she may consider TPDN next session    PT Home Exercise Plan Access code: [email or web address]           Patient will benefit from skilled therapeutic intervention in order to improve the following deficits and impairments:  Decreased activity tolerance, Decreased range of motion, Decreased strength, Hypomobility, Increased muscle spasms, Postural dysfunction, Pain, Impaired flexibility  Visit Diagnosis: Abnormal posture  Cervicalgia     Problem List Patient Active Problem List   Diagnosis Date Noted  . Primary localized osteoarthrosis of right shoulder 03/21/2017  . S/P shoulder replacement 02/19/2016  . Genetic testing 12/11/2015  . primary Osteoarthritis of left knee 01/23/2015  . Osteoarthritis of right knee 08/15/2014  . Primary cancer of lower outer quadrant of left female breast ([place]) 01/23/2012  . HYPERGLYCEMIA 05/03/2007  . HYPERPLASIA, ENDOMETRIAL NOS 02/22/2007  . DYSMENORRHEA 10/18/2006  . DEGENERATIVE JOINT DISEASE 10/18/2006  . INSOMNIA 10/18/2006  .  CHEST PAIN 10/18/2006  . OBESITY 10/05/2006  .  ANXIETY 10/05/2006  . HYPERTENSION 10/05/2006  . GASTROENTERITIS 10/05/2006  . MENORRHAGIA, PERIMENOPAUSAL 10/05/2006    Dorene Ar, PTA 10/02/2020, 11:44 AM  [facility] [address] [place], [place], [address] Phone: [phone]   Fax:  [phone]  Name: AFIYA FERREBEE MRN: [id number] Date of Birth: [birth year]

## 2020-10-05 ENCOUNTER — Encounter: Payer: Self-pay | Admitting: Physical Therapy

## 2020-10-05 ENCOUNTER — Other Ambulatory Visit: Payer: Self-pay

## 2020-10-05 ENCOUNTER — Ambulatory Visit: Payer: BC Managed Care – PPO | Admitting: Physical Therapy

## 2020-10-05 DIAGNOSIS — M542 Cervicalgia: Secondary | ICD-10-CM

## 2020-10-05 DIAGNOSIS — R293 Abnormal posture: Secondary | ICD-10-CM | POA: Diagnosis not present

## 2020-10-05 NOTE — Therapy (Signed)
[place], [place], [address] Phone: [phone]   Fax:  [phone]  Physical Therapy Treatment/Progress note  Patient Details  Name: Morgan Taylor MRN: [id number] Date of Birth: May 24, 1961 Referring Provider (PT): Rennis Harding, MD   Encounter Date: 10/05/2020   PT End of Session - 10/05/20 1338    Visit Number 6    Number of Visits 12    Date for PT Re-Evaluation 10/30/20    Authorization Type [employer] Medicare, BCBS, progress note by visit 10, recheck FOTO status by visit 10 (status done 5th visit)    PT Start Time 1330    PT Stop Time 1412    PT Time Calculation (min) 42 min    Activity Tolerance Patient tolerated treatment well    Behavior During Therapy [facility] for tasks assessed/performed           Past Medical History:  Diagnosis Date  . Anxiety   . Arthritis   . Breast cancer ([place])   . Cancer [facility]) 2009   lumpectomy  . Depression    takes Remeron nightly  . Diabetes mellitus    takes Lantus and Victoza. Average fasting blood sugar runs 89  . GERD (gastroesophageal reflux disease)    takes Omeprazole daily  . Hyperlipemia    takes Lipitor daily  . Hypertension    takes Amlodipine and Losartan daily  . Hypothyroidism    takes Synthroid daily  . Neck pain   . Numbness    in both hands  . Personal history of radiation therapy   . Primary localized osteoarthrosis of right shoulder 03/21/2017    Past Surgical History:  Procedure Laterality Date  . APPENDECTOMY    . BREAST BIOPSY    . BREAST LUMPECTOMY Left 2009  . CESAREAN SECTION    . COLONOSCOPY    . DILATION AND CURETTAGE OF UTERUS    . SHOULDER ARTHROSCOPY Left    cleaned out joint  . TOTAL KNEE ARTHROPLASTY Right 08/15/2014   Procedure: RIGHT TOTAL KNEE ARTHROPLASTY;  Surgeon: Yvette Rack., MD;  Location: [place];  Service: Orthopedics;  Laterality: Right;  . TOTAL KNEE ARTHROPLASTY Left 01/23/2015   Procedure: TOTAL KNEE  ARTHROPLASTY;  Surgeon: Yvette Rack., MD;  Location: [place];  Service: Orthopedics;  Laterality: Left;  . TOTAL SHOULDER ARTHROPLASTY Left 02/19/2016   Procedure: LEFT TOTAL SHOULDER ARTHROPLASTY;  Surgeon: Marchia Bond, MD;  Location: [place];  Service: Orthopedics;  Laterality: Left;  . TOTAL SHOULDER ARTHROPLASTY Right 03/21/2017   Procedure: TOTAL SHOULDER ARTHROPLASTY;  Surgeon: Marchia Bond, MD;  Location: [place];  Service: Orthopedics;  Laterality: Right;    There were no vitals filed for this visit.   Subjective Assessment - 10/05/20 1336    Subjective Patient reports it may be mkving a little better but she had numbness this morning.    Pertinent History bilat TSA and TKA, chronic pain in pain management, diabetic, depression, breast CA s/p lumpectomy in 2009    Limitations Sitting;House hold activities;Lifting    Diagnostic tests X-rays    Patient Stated Goals Avoid neck surgery    Currently in Pain? Yes    Pain Score 5     Pain Location Neck    Pain Orientation Right;Left    Pain Descriptors / Indicators Aching    Pain Type Chronic pain    Pain Onset More than a month ago    Pain Frequency Constant    Aggravating  Factors  positioning    Pain Relieving Factors rest    Multiple Pain Sites No              [facility] PT Assessment - 10/05/20 0001      AROM   Cervical - Right Rotation 65    Cervical - Left Rotation 55      Strength   Overall Strength Comments right 30 left 35  grip strength  left shoulder flexion 5/5 right 4+/5       Palpation   Palpation comment continued tightness in the upper traps.                          [facility] Adult PT Treatment/Exercise - 10/05/20 0001      Neck Exercises: Standing   Other Standing Exercises scap retraction x20 red; shoulder extension x20 red       Neck Exercises: Seated   Other Seated Exercise bilateral er 2x10 red; horizontal abduction 2x10 red; shoulder flexion 2x10 red;       Manual Therapy   Manual  Therapy Joint mobilization;Soft tissue mobilization;Manual Traction    Joint Mobilization PA mobilizations in supine to C6-C7 / added prone throacic PA glides; Mid thoracic PA glides    Soft tissue mobilization to upper trap and cervical spine ; periscapular trigger point release     Manual Traction sub-occipital release; gentle manual traction       Neck Exercises: Stretches   Upper Trapezius Stretch 2 reps;20 seconds    Levator Stretch 2 reps;20 seconds                  PT Education - 10/05/20 1338    Education Details HEPand symptom mangement    Person(s) Educated Patient    Methods Explanation;Demonstration;Tactile cues;Verbal cues    Comprehension Verbalized understanding;Returned demonstration;Verbal cues required               PT Long Term Goals - 10/05/20 1457      PT LONG TERM GOAL #1   Title Independent with HEP    Baseline needs HEP    Time 6    Period Weeks    Status On-going      PT LONG TERM GOAL #2   Title Improve FOTO outcome measure score to 42% or less impairment due to neck pain    Baseline 41% limited    Time 6    Status Achieved      PT LONG TERM GOAL #3   Title Increase left cervical rotation AROM at least 5-10 deg to improve ability to turn head while driving    Baseline right improved. Left rotation has remained consistent.    Time 6      PT LONG TERM GOAL #4   Title Return postural demos to help decrease neck pain to improve positional tolerance    Baseline she reports being more mindful and can return demo in clinic    Time 6    Period Weeks      PT LONG TERM GOAL #5   Title Decrease neck pain symptoms at least 40% from baseline status for improved tolerance for chores, sitting, reaching and llifting activities    Baseline plans to do household chores this weekend, will asses tolerance    Time 6    Period Weeks                 Plan - 10/05/20 1455    Clinical  Impression Statement Patient has  had minor streength and  motion gains but continues to have numbness into her hands with basic movements. She feels like she may be a little better overall but progress has been minimal. The patient will return to MD. Therapy will follow up per MD reccoendations.    Comorbidities diabetic, history of breast CA, OA with history bilateral TKA and TSA, depression, chronic pain    Examination-Activity Limitations Sit;Lift;Reach Overhead;Carry    Examination-Participation Restrictions Cleaning    Stability/Clinical Decision Making Evolving/Moderate complexity    Clinical Decision Making Moderate    Rehab Potential Fair    Clinical Impairments Affecting Rehab Potential chronic nature of pain, anx, dep, DM, posture    PT Frequency 2x / week    PT Duration 6 weeks    PT Treatment/Interventions ADLs/Self Care Home Management;Iontophoresis 4mg /ml Dexamethasone;Moist Heat;Traction;Ultrasound;Cryotherapy;Electrical Stimulation;Therapeutic exercise;Patient/family education;Manual techniques;Dry needling;Therapeutic activities;Neuromuscular re-education;Taping    PT Next Visit Plan asses her tolerance to household chores over the weekend. 1 more viist prior to MD visit, she may consider TPDN next session    PT Home Exercise Plan Access code: [email or web address]    Consulted and Agree with Plan of Care Patient           Patient will benefit from skilled therapeutic intervention in order to improve the following deficits and impairments:  Decreased activity tolerance, Decreased range of motion, Decreased strength, Hypomobility, Increased muscle spasms, Postural dysfunction, Pain, Impaired flexibility  Visit Diagnosis: Abnormal posture  Cervicalgia     Problem List Patient Active Problem List   Diagnosis Date Noted  . Primary localized osteoarthrosis of right shoulder 03/21/2017  . S/P shoulder replacement 02/19/2016  . Genetic testing 12/11/2015  . primary Osteoarthritis of left knee 01/23/2015  . Osteoarthritis of right knee  08/15/2014  . Primary cancer of lower outer quadrant of left female breast ([place]) 01/23/2012  . HYPERGLYCEMIA 05/03/2007  . HYPERPLASIA, ENDOMETRIAL NOS 02/22/2007  . DYSMENORRHEA 10/18/2006  . DEGENERATIVE JOINT DISEASE 10/18/2006  . INSOMNIA 10/18/2006  . CHEST PAIN 10/18/2006  . OBESITY 10/05/2006  . ANXIETY 10/05/2006  . HYPERTENSION 10/05/2006  . GASTROENTERITIS 10/05/2006  . MENORRHAGIA, PERIMENOPAUSAL 10/05/2006    Carney Living PT DPT  10/05/2020, 3:54 PM  [facility] [place] [address] [place], [place], [address] Phone: [phone]   Fax:  [phone]  Name: MONIKA CHESTANG MRN: [id number] Date of Birth: May 20, 1961

## 2020-10-07 ENCOUNTER — Ambulatory Visit: Payer: BC Managed Care – PPO | Admitting: Physical Therapy

## 2020-10-13 ENCOUNTER — Ambulatory Visit: Payer: BC Managed Care – PPO | Admitting: Physical Therapy

## 2020-10-13 NOTE — Therapy (Signed)
[place], [place], [address] Phone: [phone]   Fax:  [phone]  Physical Therapy Treatment/Discharge   Patient Details  Name: Morgan Taylor MRN: [id number] Date of Birth: 1961/06/10 Referring Provider (PT): Rennis Harding, MD   Encounter Date: 10/05/2020    Past Medical History:  Diagnosis Date  . Anxiety   . Arthritis   . Breast cancer ([place])   . Cancer [facility]) 2009   lumpectomy  . Depression    takes Remeron nightly  . Diabetes mellitus    takes Lantus and Victoza. Average fasting blood sugar runs 89  . GERD (gastroesophageal reflux disease)    takes Omeprazole daily  . Hyperlipemia    takes Lipitor daily  . Hypertension    takes Amlodipine and Losartan daily  . Hypothyroidism    takes Synthroid daily  . Neck pain   . Numbness    in both hands  . Personal history of radiation therapy   . Primary localized osteoarthrosis of right shoulder 03/21/2017    Past Surgical History:  Procedure Laterality Date  . APPENDECTOMY    . BREAST BIOPSY    . BREAST LUMPECTOMY Left 2009  . CESAREAN SECTION    . COLONOSCOPY    . DILATION AND CURETTAGE OF UTERUS    . SHOULDER ARTHROSCOPY Left    cleaned out joint  . TOTAL KNEE ARTHROPLASTY Right 08/15/2014   Procedure: RIGHT TOTAL KNEE ARTHROPLASTY;  Surgeon: Yvette Rack., MD;  Location: [place];  Service: Orthopedics;  Laterality: Right;  . TOTAL KNEE ARTHROPLASTY Left 01/23/2015   Procedure: TOTAL KNEE ARTHROPLASTY;  Surgeon: Yvette Rack., MD;  Location: [place];  Service: Orthopedics;  Laterality: Left;  . TOTAL SHOULDER ARTHROPLASTY Left 02/19/2016   Procedure: LEFT TOTAL SHOULDER ARTHROPLASTY;  Surgeon: Marchia Bond, MD;  Location: [place];  Service: Orthopedics;  Laterality: Left;  . TOTAL SHOULDER ARTHROPLASTY Right 03/21/2017   Procedure: TOTAL SHOULDER ARTHROPLASTY;  Surgeon: Marchia Bond, MD;  Location: [place];  Service: Orthopedics;  Laterality:  Right;    There were no vitals filed for this visit.                                   PT Long Term Goals - 10/05/20 1457      PT LONG TERM GOAL #1   Title Independent with HEP    Baseline needs HEP    Time 6    Period Weeks    Status On-going      PT LONG TERM GOAL #2   Title Improve FOTO outcome measure score to 42% or less impairment due to neck pain    Baseline 41% limited    Time 6    Status Achieved      PT LONG TERM GOAL #3   Title Increase left cervical rotation AROM at least 5-10 deg to improve ability to turn head while driving    Baseline right improved. Left rotation has remained consistent.    Time 6      PT LONG TERM GOAL #4   Title Return postural demos to help decrease neck pain to improve positional tolerance    Baseline she reports being more mindful and can return demo in clinic    Time 6    Period Weeks      PT LONG TERM GOAL #5   Title Decrease neck pain  symptoms at least 40% from baseline status for improved tolerance for chores, sitting, reaching and llifting activities    Baseline plans to do household chores this weekend, will asses tolerance    Time 6    Period Weeks                  Patient will benefit from skilled therapeutic intervention in order to improve the following deficits and impairments:  Decreased activity tolerance, Decreased range of motion, Decreased strength, Hypomobility, Increased muscle spasms, Postural dysfunction, Pain, Impaired flexibility  Visit Diagnosis: Abnormal posture  Cervicalgia     Problem List Patient Active Problem List   Diagnosis Date Noted  . Primary localized osteoarthrosis of right shoulder 03/21/2017  . S/P shoulder replacement 02/19/2016  . Genetic testing 12/11/2015  . primary Osteoarthritis of left knee 01/23/2015  . Osteoarthritis of right knee 08/15/2014  . 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  HYPERGLYCEMIA 05/03/2007  . HYPERPLASIA, ENDOMETRIAL NOS 02/22/2007  . DYSMENORRHEA 10/18/2006  . DEGENERATIVE JOINT DISEASE 10/18/2006  . INSOMNIA 10/18/2006  . CHEST PAIN 10/18/2006  . OBESITY 10/05/2006  . ANXIETY 10/05/2006  . HYPERTENSION 10/05/2006  . GASTROENTERITIS 10/05/2006  . MENORRHAGIA, PERIMENOPAUSAL 10/05/2006       PHYSICAL THERAPY DISCHARGE SUMMARY  Visits from Start of Care: 6  Current functional level related to goals / functional outcomes: Patient contacted clinic 10/12/20 and requested d/c from therapy.    Remaining deficits: Continued neck pain   Education / Equipment: Previous HEP instruction Plan: Patient agrees to discharge.  Patient goals were partially met. Patient is being discharged due to the patient's request.  ?????           Beaulah Dinning, PT, DPT 10/13/20 9:04 AM       [facility] [address] [place], [place], [address] Phone: [phone]   Fax:  [phone]  Name: Morgan Taylor MRN: [id number] Date of Birth: 24-Dec-1961

## 2020-10-15 ENCOUNTER — Other Ambulatory Visit: Payer: Self-pay | Admitting: Orthopaedic Surgery

## 2020-10-15 ENCOUNTER — Ambulatory Visit: Payer: BC Managed Care – PPO | Admitting: Physical Therapy

## 2020-10-15 DIAGNOSIS — M542 Cervicalgia: Secondary | ICD-10-CM

## 2020-10-20 ENCOUNTER — Encounter: Payer: Medicare Other | Admitting: Physical Therapy

## 2020-10-22 ENCOUNTER — Other Ambulatory Visit: Payer: Self-pay | Admitting: Internal Medicine

## 2020-10-22 ENCOUNTER — Encounter: Payer: Medicare Other | Admitting: Physical Therapy

## 2020-10-22 DIAGNOSIS — Z1231 Encounter for screening mammogram for malignant neoplasm of breast: Secondary | ICD-10-CM

## 2020-10-30 NOTE — Patient Instructions (Addendum)
DUE TO COVID-19 ONLY ONE VISITOR IS ALLOWED TO COME WITH YOU AND STAY IN THE WAITING ROOM ONLY DURING PRE OP AND PROCEDURE.   IF YOU WILL BE ADMITTED INTO THE HOSPITAL YOU ARE ALLOWED ONE SUPPORT PERSON DURING VISITATION HOURS ONLY (10AM -8PM)   . The support person may change daily. . The support person must pass our screening, gel in and out, and wear a mask at all times, including in the patient's room. . Patients must also wear a mask when staff or their support person are in the room.   COVID SWAB TESTING MUST BE COMPLETED ON:  Friday, 11-06-20 @ 1:45 PM   [address] [place], [place] [address]  (Must self quarantine after testing. Follow instructions on handout.)        Your procedure is scheduled on:   Tuesday, 11-10-20    Report to [facility] Main  Entrance   Report to admitting at 8:00 AM   Call this number if you have problems the morning of surgery [phone]   Do not eat food :After Midnight.   May have liquids until 7:30 AM day of surgery  CLEAR LIQUID DIET  Foods Allowed                                                                     Foods Excluded  Water, Black Coffee and tea, regular and decaf               liquids that you cannot  Plain Jell-O in any flavor  (No red)                                      see through such as: Fruit ices (not with fruit pulp)                                       milk, soups, orange juice              Iced Popsicles (No red)                                      All solid food                                   Apple juices Sports drinks like Gatorade (No red) Lightly seasoned clear broth or consume(fat free) Sugar, honey syrup   Complete one G2 drink the morning of surgery at 7:30 AM  the day of surgery.     Oral Hygiene is also important to reduce your risk of infection.                                    Remember - BRUSH YOUR TEETH THE MORNING OF SURGERY WITH YOUR REGULAR TOOTHPASTE   Do NOT smoke after  Midnight  Take these medicines the morning of surgery with A SIP OF WATER: Amlodipine, Atorvastatin, Levothyroxine, Omeprazole   How to Manage Your Diabetes Before and After Surgery  Why is it important to control my blood sugar before and after surgery? . Improving blood sugar levels before and after surgery helps healing and can limit problems. . A way of improving blood sugar control is eating a healthy diet by: o  Eating less sugar and carbohydrates o  Increasing activity/exercise o  Talking with your doctor about reaching your blood sugar goals . High blood sugars (greater than 180 mg/dL) can raise your risk of infections and slow your recovery, so you will need to focus on controlling your diabetes during the weeks before surgery. . Make sure that the doctor who takes care of your diabetes knows about your planned surgery including the date and location.  How do I manage my blood sugar before surgery? . Check your blood sugar at least 4 times a day, starting 2 days before surgery, to make sure that the level is not too high or low. o Check your blood sugar the morning of your surgery when you wake up and every 2 hours until you get to the Short Stay unit. . If your blood sugar is less than 70 mg/dL, you will need to treat for low blood sugar: o Do not take insulin. o Treat a low blood sugar (less than 70 mg/dL) with  cup of clear juice (cranberry or apple), 4 glucose tablets, OR glucose gel. o Recheck blood sugar in 15 minutes after treatment (to make sure it is greater than 70 mg/dL). If your blood sugar is not greater than 70 mg/dL on recheck, call [phone] for further instructions. . Report your blood sugar to the short stay nurse when you get to Short Stay.  . If you are admitted to the hospital after surgery: o Your blood sugar will be checked by the staff and you will probably be given insulin after surgery (instead of oral diabetes medicines) to make sure you have good  blood sugar levels. o The goal for blood sugar control after surgery is 80-180 mg/dL.   WHAT DO I DO ABOUT MY DIABETES MEDICATION?  Marland Kitchen Do not take oral diabetes medicines (pills) the morning of surgery.  . THE DAY BEFORE SURGERY:  Take Actos as prescribed                Take Victoza in the am.     Take 17 units of Lantus insulin.       . THE MORNING OF SURGERY:  Do not take Actos, Victoza or Lantus                . The day of surgery, do not take other diabetes injectables, including Byetta (exenatide), Bydureon (exenatide ER), Victoza (liraglutide), or Trulicity (dulaglutide).   Reviewed and Endorsed by [facility] Patient Education Committee, August 2015                               You may not have any metal on your body including hair pins, jewelry, and body piercings             Do not wear make-up, lotions, powders, perfumes/cologne, or deodorant             Do not wear nail polish.  Do not shave  48 hours prior to surgery.    Do  not bring valuables to the hospital. [place].   Contacts, dentures or bridgework may not be worn into surgery.     Patients discharged the day of surgery will not be allowed to drive home.               Please read over the following fact sheets you were given: IF YOU HAVE QUESTIONS ABOUT YOUR PRE OP INSTRUCTIONS PLEASE CALL [phone]     [facility] - Preparing for Surgery Before surgery, you can play an important role.  Because skin is not sterile, your skin needs to be as free of germs as possible.  You can reduce the number of germs on your skin by washing with CHG (chlorahexidine gluconate) soap before surgery.  CHG is an antiseptic cleaner which kills germs and bonds with the skin to continue killing germs even after washing. Please DO NOT use if you have an allergy to CHG or antibacterial soaps.  If your skin becomes reddened/irritated stop using the CHG and inform your nurse when you arrive at  Short Stay. Do not shave (including legs and underarms) for at least 48 hours prior to the first CHG shower.  You may shave your face/neck.  Please follow these instructions carefully:  1.  Shower with CHG Soap the night before surgery and the  morning of surgery.  2.  If you choose to wash your hair, wash your hair first as usual with your normal  shampoo.  3.  After you shampoo, rinse your hair and body thoroughly to remove the shampoo.                             4.  Use CHG as you would any other liquid soap.  You can apply chg directly to the skin and wash.  Gently with a scrungie or clean washcloth.  5.  Apply the CHG Soap to your body ONLY FROM THE NECK DOWN.   Do   not use on face/ open                           Wound or open sores. Avoid contact with eyes, ears mouth and   genitals (private parts).                       Wash face,  Genitals (private parts) with your normal soap.             6.  Wash thoroughly, paying special attention to the area where your    surgery  will be performed.  7.  Thoroughly rinse your body with warm water from the neck down.  8.  DO NOT shower/wash with your normal soap after using and rinsing off the CHG Soap.                9.  Pat yourself dry with a clean towel.            10.  Wear clean pajamas.            11.  Place clean sheets on your bed the night of your first shower and do not  sleep with pets. Day of Surgery : Do not apply any lotions/deodorants the morning of surgery.  Please wear clean clothes to the hospital/surgery center.  FAILURE TO FOLLOW THESE INSTRUCTIONS MAY RESULT IN  THE CANCELLATION OF YOUR SURGERY  PATIENT SIGNATURE_________________________________  NURSE SIGNATURE__________________________________  ________________________________________________________________________   Adam Phenix  An incentive spirometer is a tool that can help keep your lungs clear and active. This tool measures how well you are filling  your lungs with each breath. Taking long deep breaths may help reverse or decrease the chance of developing breathing (pulmonary) problems (especially infection) following:  A long period of time when you are unable to move or be active. BEFORE THE PROCEDURE   If the spirometer includes an indicator to show your best effort, your nurse or respiratory therapist will set it to a desired goal.  If possible, sit up straight or lean slightly forward. Try not to slouch.  Hold the incentive spirometer in an upright position. INSTRUCTIONS FOR USE  1. Sit on the edge of your bed if possible, or sit up as far as you can in bed or on a chair. 2. Hold the incentive spirometer in an upright position. 3. Breathe out normally. 4. Place the mouthpiece in your mouth and seal your lips tightly around it. 5. Breathe in slowly and as deeply as possible, raising the piston or the ball toward the top of the column. 6. Hold your breath for 3-5 seconds or for as long as possible. Allow the piston or ball to fall to the bottom of the column. 7. Remove the mouthpiece from your mouth and breathe out normally. 8. Rest for a few seconds and repeat Steps 1 through 7 at least 10 times every 1-2 hours when you are awake. Take your time and take a few normal breaths between deep breaths. 9. The spirometer may include an indicator to show your best effort. Use the indicator as a goal to work toward during each repetition. 10. After each set of 10 deep breaths, practice coughing to be sure your lungs are clear. If you have an incision (the cut made at the time of surgery), support your incision when coughing by placing a pillow or rolled up towels firmly against it. Once you are able to get out of bed, walk around indoors and cough well. You may stop using the incentive spirometer when instructed by your caregiver.  RISKS AND COMPLICATIONS  Take your time so you do not get dizzy or light-headed.  If you are in pain, you may  need to take or ask for pain medication before doing incentive spirometry. It is harder to take a deep breath if you are having pain. AFTER USE  Rest and breathe slowly and easily.  It can be helpful to keep track of a log of your progress. Your caregiver can provide you with a simple table to help with this. If you are using the spirometer at home, follow these instructions: [place] IF:   You are having difficultly using the spirometer.  You have trouble using the spirometer as often as instructed.  Your pain medication is not giving enough relief while using the spirometer.  You develop fever of 100.5 F (38.1 C) or higher. SEEK IMMEDIATE MEDICAL CARE IF:   You cough up bloody sputum that had not been present before.  You develop fever of 102 F (38.9 C) or greater.  You develop worsening pain at or near the incision site. MAKE SURE YOU:   Understand these instructions.  Will watch your condition.  Will get help right away if you are not doing well or get worse. Document Released: 04/24/2007 Document Revised: 03/05/2012 Document Reviewed: 06/25/2007 ExitCare  Patient Information [address], [place].   ________________________________________________________________________

## 2020-10-30 NOTE — Progress Notes (Addendum)
COVID Vaccine Completed:  x2 Date COVID Vaccine completed:  04-04-20 & 04-28-20 COVID vaccine manufacturer: [place]   PCP - Sandi Mariscal, MD Cardiologist -   Chest x-ray -  EKG - 11-02-20 in Epic  Stress Test -  ECHO -  Cardiac Cath -  Pacemaker/ICD device last checked:  Sleep Study - 10+ years, unsure of results CPAP -   Fasting Blood Sugar - 120 to 150 Checks Blood Sugar - once or twice a week  Blood Thinner Instructions: Aspirin Instructions: Last Dose:  Anesthesia review:   Patient denies shortness of breath, fever, cough and chest pain at PAT appointment   Patient verbalized understanding of instructions that were given to them at the PAT appointment. Patient was also instructed that they will need to review over the PAT instructions again at home before surgery.

## 2020-11-02 ENCOUNTER — Other Ambulatory Visit: Payer: Self-pay

## 2020-11-02 ENCOUNTER — Encounter (HOSPITAL_COMMUNITY): Payer: Self-pay

## 2020-11-02 ENCOUNTER — Encounter (HOSPITAL_COMMUNITY)
Admission: RE | Admit: 2020-11-02 | Discharge: 2020-11-02 | Disposition: A | Discharge status: Home / Self Care | Payer: BC Managed Care – PPO | Source: Ambulatory Visit | Attending: Orthopedic Surgery | Admitting: Orthopedic Surgery

## 2020-11-02 DIAGNOSIS — Z01818 Encounter for other preprocedural examination: Secondary | ICD-10-CM | POA: Insufficient documentation

## 2020-11-02 DIAGNOSIS — I10 Essential (primary) hypertension: Secondary | ICD-10-CM | POA: Diagnosis not present

## 2020-11-02 LAB — BASIC METABOLIC PANEL
Anion gap: 8 (ref 5–15)
BUN: 18 mg/dL (ref 6–20)
CO2: 26 mmol/L (ref 22–32)
Calcium: 8.9 mg/dL (ref 8.9–10.3)
Chloride: 105 mmol/L (ref 98–111)
Creatinine, Ser: 0.99 mg/dL (ref 0.44–1.00)
GFR, Estimated: >60 mL/min (ref >60)
Glucose, Bld: 119 mg/dL — ABNORMAL HIGH (ref 70–99)
Potassium: 3.4 mmol/L — ABNORMAL LOW (ref 3.5–5.1)
Sodium: 139 mmol/L (ref 135–145)

## 2020-11-02 LAB — CBC
HCT: 39 % (ref 36.0–46.0)
Hemoglobin: 12.5 g/dL (ref 12.0–15.0)
MCH: 30.5 pg (ref 26.0–34.0)
MCHC: 32.1 g/dL (ref 30.0–36.0)
MCV: 95.1 fL (ref 80.0–100.0)
Platelets: 242 10*3/uL (ref 150–400)
RBC: 4.1 MIL/uL (ref 3.87–5.11)
RDW: 13 % (ref 11.5–15.5)
WBC: 6.1 10*3/uL (ref 4.0–10.5)
nRBC: 0 % (ref 0.0–0.2)

## 2020-11-02 LAB — SURGICAL PCR SCREEN
MRSA, PCR: NEGATIVE
Staphylococcus aureus: NEGATIVE

## 2020-11-02 LAB — GLUCOSE, CAPILLARY: Glucose-Capillary: 135 mg/dL — ABNORMAL HIGH (ref 70–99)

## 2020-11-02 LAB — HEMOGLOBIN A1C
Hgb A1c MFr Bld: 7.1 % — ABNORMAL HIGH (ref 4.8–5.6)
Mean Plasma Glucose: 157.07 mg/dL

## 2020-11-05 ENCOUNTER — Telehealth: Payer: Self-pay | Admitting: Internal Medicine

## 2020-11-05 NOTE — Telephone Encounter (Signed)
   Morgan Taylor DOB: 02/18/1961 MRN: [id number]   RIDER WAIVER AND RELEASE OF LIABILITY  For purposes of improving physical access to our facilities, [facility] is pleased to partner with third parties to provide [facility] patients or other authorized individuals the option of convenient, on-demand ground transportation services (the Technical brewer") through use of the technology service that enables users to request on-demand ground transportation from independent third-party providers.  By opting to use and accept these [employer], I, the undersigned, hereby agree on behalf of myself, and on behalf of any minor child using the [employer] for whom I am the parent or legal guardian, as follows:  1. Government social research officer provided to me are provided by independent third-party transportation providers who are not [employer] or employees and who are unaffiliated with [employer]. 2. [facility] is neither a transportation carrier nor a common or public carrier. 3. [facility] has no control over the quality or safety of the transportation that occurs as a result of the [employer]. 4. [facility] cannot guarantee that any third-party transportation provider will complete any arranged transportation service. 5. [facility] makes no representation, warranty, or guarantee regarding the reliability, timeliness, quality, safety, suitability, or availability of any of the Transport Services or that they will be error free. 6. I fully understand that traveling by vehicle involves risks and dangers of serious bodily injury, including permanent disability, paralysis, and death. I agree, on behalf of myself and on behalf of any minor child using the Transport Services for whom I am the parent or legal guardian, that the entire risk arising out of my use of the [employer] remains solely with me, to the maximum extent permitted under applicable law. 7. The [employer]  are provided "as is" and "as available." [facility] disclaims all representations and warranties, express, implied or statutory, not expressly set out in these terms, including the implied warranties of merchantability and fitness for a particular purpose. 8. I hereby waive and release [facility], its agents, employees, officers, directors, representatives, insurers, attorneys, assigns, successors, subsidiaries, and affiliates from any and all past, present, or future claims, demands, liabilities, actions, causes of action, or suits of any kind directly or indirectly arising from acceptance and use of the [employer]. 9. I further waive and release [facility] and its affiliates from all present and future liability and responsibility for any injury or death to persons or damages to property caused by or related to the use of the [employer]. 10. I have read this Waiver and Release of Liability, and I understand the terms used in it and their legal significance. This Waiver is freely and voluntarily given with the understanding that my right (as well as the right of any minor child for whom I am the parent or legal guardian using the [employer]) to legal recourse against [facility] in connection with the [employer] is knowingly surrendered in return for use of these services.   I attest that I read the consent document to Morgan Taylor, gave Morgan Taylor the opportunity to ask questions and answered the questions asked (if any). I affirm that Morgan Taylor then provided consent for she's participation in this program.     Morgan Taylor

## 2020-11-06 ENCOUNTER — Other Ambulatory Visit (HOSPITAL_COMMUNITY)
Admission: RE | Admit: 2020-11-06 | Discharge: 2020-11-06 | Disposition: A | Discharge status: Home / Self Care | Payer: BC Managed Care – PPO | Source: Ambulatory Visit | Attending: Orthopedic Surgery | Admitting: Orthopedic Surgery

## 2020-11-06 DIAGNOSIS — Z01812 Encounter for preprocedural laboratory examination: Secondary | ICD-10-CM | POA: Diagnosis present

## 2020-11-06 DIAGNOSIS — Z20822 Contact with and (suspected) exposure to covid-19: Secondary | ICD-10-CM | POA: Insufficient documentation

## 2020-11-06 LAB — SARS CORONAVIRUS 2 (TAT 6-24 HRS): SARS Coronavirus 2: NEGATIVE

## 2020-11-08 ENCOUNTER — Other Ambulatory Visit: Payer: Self-pay

## 2020-11-08 ENCOUNTER — Ambulatory Visit
Admission: RE | Admit: 2020-11-08 | Discharge: 2020-11-08 | Disposition: A | Discharge status: Home / Self Care | Payer: Medicare Other | Source: Ambulatory Visit | Attending: Orthopaedic Surgery | Admitting: Orthopaedic Surgery

## 2020-11-08 DIAGNOSIS — M542 Cervicalgia: Secondary | ICD-10-CM

## 2020-11-09 NOTE — H&P (Signed)
HIP ARTHROPLASTY ADMISSION H&P  Patient ID: DELLIE PIASECKI MRN: [id number] DOB/AGE: Feb 04, 1961 59 y.o.  Chief Complaint: left hip pain.  Planned Procedure Date: 11/10/20 Medical Clearance by Dr. Nancy Fetter  HPI: Geraldyn Shain is a 59 year old female here today for history and physical prior to a left hip total arthroplasty. She has left hip pain located in her groin which has been intermittent for the last year, but has become more constant within the last few months.  Pain is 8/10.  She has so far tried meloxicam as well as pain medications which she currently takes for her chronic neck pain and activity modification. She has failed conservative treatment and would like to move forward with total hip arthroplasty.    Past Medical History:  Diagnosis Date  . Anxiety   . Arthritis   . Breast cancer ([place])   . Cancer [facility]) 2009   lumpectomy  . Depression    takes Remeron nightly  . Diabetes mellitus    takes Lantus and Victoza. Average fasting blood sugar runs 89  . GERD (gastroesophageal reflux disease)    takes Omeprazole daily  . Hyperlipemia    takes Lipitor daily  . Hypertension    takes Amlodipine and Losartan daily  . Hypothyroidism    takes Synthroid daily  . Neck pain   . Numbness    in both hands  . Personal history of radiation therapy   . Primary localized osteoarthrosis of right shoulder 03/21/2017   Past Surgical History:  Procedure Laterality Date  . APPENDECTOMY    . BREAST BIOPSY    . BREAST LUMPECTOMY Left 2009  . CESAREAN SECTION    . COLONOSCOPY    . DILATION AND CURETTAGE OF UTERUS    . SHOULDER ARTHROSCOPY Left    cleaned out joint  . TOTAL KNEE ARTHROPLASTY Right 08/15/2014   Procedure: RIGHT TOTAL KNEE ARTHROPLASTY;  Surgeon: Yvette Rack., MD;  Location: [facility];  Service: Orthopedics;  Laterality: Right;  . TOTAL KNEE ARTHROPLASTY Left 01/23/2015   Procedure: TOTAL KNEE ARTHROPLASTY;  Surgeon: Yvette Rack., MD;  Location: [facility];  Service: Orthopedics;   Laterality: Left;  . TOTAL SHOULDER ARTHROPLASTY Left 02/19/2016   Procedure: LEFT TOTAL SHOULDER ARTHROPLASTY;  Surgeon: Marchia Bond, MD;  Location: [facility];  Service: Orthopedics;  Laterality: Left;  . TOTAL SHOULDER ARTHROPLASTY Right 03/21/2017   Procedure: TOTAL SHOULDER ARTHROPLASTY;  Surgeon: Marchia Bond, MD;  Location: [facility];  Service: Orthopedics;  Laterality: Right;   Allergies  Allergen Reactions  . Lyrica [Pregabalin] Swelling    Hands, legs, feet  . Sulfonamide Derivatives Hives  . Vortioxetine Nausea And Vomiting   Prior to Admission medications   Medication Sig Start Date End Date Taking? Authorizing Provider  amLODipine (NORVASC) 10 MG tablet Take 10 mg by mouth daily.    Yes [provider]  atorvastatin (LIPITOR) 20 MG tablet Take 20 mg by mouth daily.    Yes [provider]  Biotin 10000 MCG TABS Take 10,000 mcg by mouth at bedtime.   Yes [provider]  Cholecalciferol (VITAMIN D) 125 MCG (5000 UT) CAPS Take 5,000 Units by mouth daily.   Yes [provider]  insulin glargine (LANTUS) 100 UNIT/ML injection Inject 34 Units into the skin at bedtime.    Yes [provider]  levothyroxine (SYNTHROID, LEVOTHROID) 75 MCG tablet Take 75 mcg by mouth daily before breakfast.  01/05/16  Yes [provider]  Liraglutide (VICTOZA) 18  MG/3ML SOPN Inject 1.8 mg into the skin daily.   Yes [provider]  losartan (COZAAR) 100 MG tablet Take 100 mg by mouth daily.    Yes [provider]  meloxicam (MOBIC) 15 MG tablet Take 15 mg by mouth daily.   Yes [provider]  omeprazole (PRILOSEC) 40 MG capsule Take 40 mg by mouth daily.    Yes [provider]  oxyCODONE-acetaminophen (PERCOCET/ROXICET) 5-325 MG tablet Take 1 tablet by mouth every 6 (six) hours as needed for moderate pain or severe pain.    Yes [provider]  pioglitazone (ACTOS) 15 MG tablet Take 15 mg by mouth daily.   Yes  [provider]  valACYclovir (VALTREX) 500 MG tablet Take 500 mg by mouth daily.   Yes [provider]  Aspirin-Acetaminophen-Caffeine (GOODY HEADACHE PO) Take 1 packet by mouth daily as needed (headache). Patient not taking: Reported on 09/18/2020    [provider]  baclofen (LIORESAL) 10 MG tablet Take 1 tablet (10 mg total) by mouth 3 (three) times daily. As needed for muscle spasm Patient not taking: Reported on 03/07/2017 02/19/16   Marchia Bond, MD  baclofen (LIORESAL) 10 MG tablet Take 1 tablet (10 mg total) by mouth 3 (three) times daily. As needed for muscle spasm Patient not taking: Reported on 10/29/2020 03/21/17   Marchia Bond, MD  dicyclomine (BENTYL) 20 MG tablet Take 1 tablet (20 mg total) by mouth 2 (two) times daily. Patient not taking: Reported on 10/29/2020 02/13/18   Horton, Barbette Hair, MD  mirtazapine (REMERON) 30 MG tablet Take 30 mg by mouth at bedtime. Patient not taking: Reported on 09/18/2020 08/29/16   [provider]  Multiple Vitamins-Minerals (HM MULTIVITAMIN ADULT GUMMY PO) Take 2 tablets by mouth at bedtime. Patient not taking: Reported on 09/18/2020    [provider]  ondansetron (ZOFRAN ODT) 4 MG disintegrating tablet Take 1 tablet (4 mg total) by mouth every 8 (eight) hours as needed for nausea or vomiting. Patient not taking: Reported on 09/18/2020 02/13/18   Horton, Barbette Hair, MD  ondansetron (ZOFRAN) 4 MG tablet Take 1 tablet (4 mg total) by mouth every 8 (eight) hours as needed for nausea or vomiting. Patient not taking: Reported on 09/18/2020 03/21/17   Marchia Bond, MD  oxyCODONE (OXYCONTIN) 10 mg 12 hr tablet Take 1 tablet (10 mg total) by mouth every 12 (twelve) hours. Patient not taking: Reported on 09/18/2020 03/22/17   Marchia Bond, MD  oxyCODONE-acetaminophen (PERCOCET) 10-325 MG tablet Take 1-2 tablets by mouth every 6 (six) hours as needed for pain. MAXIMUM TOTAL ACETAMINOPHEN DOSE IS 4000 MG PER DAY Patient  not taking: Reported on 09/18/2020 03/21/17   Marchia Bond, MD  sennosides-docusate sodium (SENOKOT-S) 8.6-50 MG tablet Take 2 tablets by mouth daily. Patient not taking: Reported on 09/18/2020 03/21/17   Marchia Bond, MD   Social History   Socioeconomic History  . Marital status: Divorced    Spouse name: Not on file  . Number of children: 5  . Years of education: Not on file  . Highest education level: Not on file  Occupational History  . Not on file  Tobacco Use  . Smoking status: Former Research scientist (life sciences)  . Smokeless tobacco: Never Used  . Tobacco comment: quit smoking 14 yrs ago  Vaping Use  . Vaping Use: Never used  Substance and Sexual Activity  . Alcohol use: No  . Drug use: No  . Sexual activity: Not Currently    Birth control/protection:  Post-menopausal  Other Topics Concern  . Not on file  Social History Narrative  . Not on file   Social Determinants of Health   Financial Resource Strain:   . Difficulty of Paying Living Expenses: Not on file  Food Insecurity:   . Worried About Charity fundraiser in the Last Year: Not on file  . Ran Out of Food in the Last Year: Not on file  Transportation Needs:   . Lack of Transportation (Medical): Not on file  . Lack of Transportation (Non-Medical): Not on file  Physical Activity:   . Days of Exercise per Week: Not on file  . Minutes of Exercise per Session: Not on file  Stress:   . Feeling of Stress : Not on file  Social Connections:   . Frequency of Communication with Friends and Family: Not on file  . Frequency of Social Gatherings with Friends and Family: Not on file  . Attends Religious Services: Not on file  . Active Member of Clubs or Organizations: Not on file  . Attends Archivist Meetings: Not on file  . Marital Status: Not on file   She currently works at [employer], but is on a medical leave of absence due to pain in her hip.  She lives at home with her 10 year old son who will be able to help her after  surgery.  She is not a smoker.     Family History  Problem Relation Age of Onset  . Cancer Paternal Grandmother        NOS  . Pancreatic cancer Paternal Aunt   . Cancer Paternal Aunt        NOS  . Breast cancer Mother 65       Stage 3  . Colon cancer Neg Hx   . Rectal cancer Neg Hx   . Stomach cancer Neg Hx     ROS: Currently denies lightheadedness, dizziness, Fever, chills, CP, SOB. No personal history of DVT, PE, MI, or CVA Patient does have partial dentures which she will remove prior to surgery.  All other systems have been reviewed and were otherwise currently negative with the exception of those mentioned in the HPI and as above.  Objective: Vitals: 5 feet, 21/2 inches.  Weight 193 pounds.  Temp 97.3 degrees Fahrenheit.  BP 164/82.  Pulse 96 BPM.  98% on room air.   Physical Exam: General: Alert, NAD.  HEENT: EOMI, Good Neck Extension Pulm: No increased work of breathing.  Clear B/L A/P w/o crackle or wheeze.  CV: RRR, No m/g/r appreciated  GI: soft, NT, ND Neuro: Neuro without gross focal deficit.  Sensation intact distally Skin: No lesions in the area of chief complaint MSK/Surgical Site: Can flex at right hip 0-80 degrees with pain.  Internal rotation recreates pain.  EHL and FHL are intact.   Imaging Review  Two views of the left hip taken at previous visit show osteoarthritis with asymmetry and osteophyte formation.  Assessment: OA LEFT HIP  Plan: Plan for Procedure(s): TOTAL HIP ARTHROPLASTY  The patient history, physical exam, clinical judgement of the provider and imaging are consistent with end stage degenerative joint disease and total joint arthroplasty is deemed medically necessary. The treatment options including medical management, injection therapy, and arthroplasty were discussed at length. The risks and benefits of Procedure(s): TOTAL HIP ARTHROPLASTY were presented and reviewed.  The risks of nonoperative treatment, versus surgical intervention  including but not limited to continued pain, aseptic loosening, stiffness, dislocation/subluxation, infection, bleeding,  nerve injury, blood clots, cardiopulmonary complications, morbidity, mortality, among others were discussed. The patient verbalizes understanding and wishes to proceed with the plan.  Patient is being admitted for surgery, pain control, PT, prophylactic antibiotics, VTE prophylaxis, progressive ambulation, ADL's and discharge planning.   Dental prophylaxis discussed and recommended for 2 years postoperatively.   The patient does meet the criteria for TXA which will be used perioperatively.    ASA 325 mg will be used postoperatively for DVT prophylaxis in addition to SCDs, and early ambulation.  She will have OPPT at [facility] starting 11/12/20   Ventura Bruns, PA-C 11/09/2020 9:19 AM

## 2020-11-10 ENCOUNTER — Other Ambulatory Visit: Payer: Self-pay

## 2020-11-10 ENCOUNTER — Ambulatory Visit (HOSPITAL_COMMUNITY): Payer: BC Managed Care – PPO | Admitting: Anesthesiology

## 2020-11-10 ENCOUNTER — Encounter (HOSPITAL_COMMUNITY): Payer: Self-pay | Admitting: Orthopedic Surgery

## 2020-11-10 ENCOUNTER — Observation Stay (HOSPITAL_COMMUNITY): Payer: BC Managed Care – PPO

## 2020-11-10 ENCOUNTER — Encounter (HOSPITAL_COMMUNITY)
Admission: RE | Disposition: A | Discharge status: Home / Self Care | Payer: Self-pay | Source: Home / Self Care | Attending: Orthopedic Surgery

## 2020-11-10 ENCOUNTER — Observation Stay (HOSPITAL_COMMUNITY)
Admission: RE | Admit: 2020-11-10 | Discharge: 2020-11-11 | Disposition: A | Discharge status: Home / Self Care | Payer: BC Managed Care – PPO | Attending: Orthopedic Surgery | Admitting: Orthopedic Surgery

## 2020-11-10 DIAGNOSIS — M1612 Unilateral primary osteoarthritis, left hip: Secondary | ICD-10-CM | POA: Diagnosis present

## 2020-11-10 DIAGNOSIS — E1159 Type 2 diabetes mellitus with other circulatory complications: Secondary | ICD-10-CM | POA: Insufficient documentation

## 2020-11-10 DIAGNOSIS — Z96642 Presence of left artificial hip joint: Secondary | ICD-10-CM

## 2020-11-10 DIAGNOSIS — Z79899 Other long term (current) drug therapy: Secondary | ICD-10-CM | POA: Diagnosis not present

## 2020-11-10 DIAGNOSIS — E039 Hypothyroidism, unspecified: Secondary | ICD-10-CM | POA: Diagnosis not present

## 2020-11-10 DIAGNOSIS — Z87891 Personal history of nicotine dependence: Secondary | ICD-10-CM | POA: Diagnosis not present

## 2020-11-10 DIAGNOSIS — I10 Essential (primary) hypertension: Secondary | ICD-10-CM | POA: Insufficient documentation

## 2020-11-10 DIAGNOSIS — Z794 Long term (current) use of insulin: Secondary | ICD-10-CM | POA: Insufficient documentation

## 2020-11-10 HISTORY — PX: TOTAL HIP ARTHROPLASTY: SHX124

## 2020-11-10 LAB — GLUCOSE, CAPILLARY
Glucose-Capillary: 126 mg/dL — ABNORMAL HIGH (ref 70–99)
Glucose-Capillary: 175 mg/dL — ABNORMAL HIGH (ref 70–99)
Glucose-Capillary: 243 mg/dL — ABNORMAL HIGH (ref 70–99)

## 2020-11-10 SURGERY — ARTHROPLASTY, HIP, TOTAL,POSTERIOR APPROACH
Anesthesia: Monitor Anesthesia Care | Site: Hip | Laterality: Left

## 2020-11-10 MED ORDER — HYDROMORPHONE HCL 1 MG/ML IJ SOLN
0.5000 mg | INTRAMUSCULAR | Status: DC | PRN
  Administered 2020-11-10: 1 mg via INTRAVENOUS
  Filled 2020-11-10: qty 1

## 2020-11-10 MED ORDER — FENTANYL CITRATE (PF) 100 MCG/2ML IJ SOLN
25.0000 ug | INTRAMUSCULAR | Status: DC | PRN
  Administered 2020-11-10 (×4): 50 ug via INTRAVENOUS

## 2020-11-10 MED ORDER — OXYCODONE HCL 5 MG PO TABS: 5.0000 mg | ORAL_TABLET | Freq: Once | ORAL | Status: DC | PRN

## 2020-11-10 MED ORDER — METHOCARBAMOL 500 MG PO TABS
500.0000 mg | ORAL_TABLET | Freq: Four times a day (QID) | ORAL | Status: DC | PRN
  Administered 2020-11-10: 500 mg via ORAL
  Filled 2020-11-10: qty 1

## 2020-11-10 MED ORDER — ONDANSETRON HCL 4 MG/2ML IJ SOLN
4.0000 mg | Freq: Four times a day (QID) | INTRAMUSCULAR | Status: DC | PRN
  Administered 2020-11-10 (×2): 4 mg via INTRAVENOUS
  Filled 2020-11-10 (×2): qty 2

## 2020-11-10 MED ORDER — CEFAZOLIN SODIUM-DEXTROSE 2-4 GM/100ML-% IV SOLN
2.0000 g | Freq: Four times a day (QID) | INTRAVENOUS | Status: AC
  Administered 2020-11-10 (×2): 2 g via INTRAVENOUS
  Filled 2020-11-10 (×2): qty 100

## 2020-11-10 MED ORDER — KETAMINE HCL 10 MG/ML IJ SOLN
INTRAMUSCULAR | Status: DC | PRN
  Administered 2020-11-10: 10 mg via INTRAVENOUS

## 2020-11-10 MED ORDER — MAGNESIUM CITRATE PO SOLN: 1.0000 | Freq: Once | ORAL | Status: DC | PRN

## 2020-11-10 MED ORDER — HYDROMORPHONE HCL 2 MG PO TABS
2.0000 mg | ORAL_TABLET | Freq: Four times a day (QID) | ORAL | 0 refills | Status: DC | PRN
Start: 2020-11-10 — End: 2022-02-13

## 2020-11-10 MED ORDER — ALUM & MAG HYDROXIDE-SIMETH 200-200-20 MG/5ML PO SUSP: 30.0000 mL | ORAL | Status: DC | PRN

## 2020-11-10 MED ORDER — 0.9 % SODIUM CHLORIDE (POUR BTL) OPTIME
TOPICAL | Status: DC | PRN
  Administered 2020-11-10: 1000 mL

## 2020-11-10 MED ORDER — OXYCODONE HCL 5 MG PO TABS
5.0000 mg | ORAL_TABLET | ORAL | Status: DC | PRN
  Filled 2020-11-10: qty 2

## 2020-11-10 MED ORDER — FENTANYL CITRATE (PF) 100 MCG/2ML IJ SOLN
INTRAMUSCULAR | Status: AC
  Filled 2020-11-10: qty 2

## 2020-11-10 MED ORDER — PHENYLEPHRINE HCL (PRESSORS) 10 MG/ML IV SOLN
INTRAVENOUS | Status: AC
  Filled 2020-11-10: qty 1

## 2020-11-10 MED ORDER — TRANEXAMIC ACID-NACL 1000-0.7 MG/100ML-% IV SOLN
1000.0000 mg | INTRAVENOUS | Status: AC
  Administered 2020-11-10: 1000 mg via INTRAVENOUS
  Filled 2020-11-10: qty 100

## 2020-11-10 MED ORDER — PROPOFOL 10 MG/ML IV BOLUS
INTRAVENOUS | Status: DC | PRN
  Administered 2020-11-10 (×2): 20 mg via INTRAVENOUS
  Administered 2020-11-10: 100 mg via INTRAVENOUS
  Administered 2020-11-10 (×2): 20 mg via INTRAVENOUS
  Administered 2020-11-10 (×2): 10 mg via INTRAVENOUS

## 2020-11-10 MED ORDER — PROPOFOL 500 MG/50ML IV EMUL
INTRAVENOUS | Status: DC | PRN
  Administered 2020-11-10: 25 ug/kg/min via INTRAVENOUS

## 2020-11-10 MED ORDER — BUPIVACAINE IN DEXTROSE 0.75-8.25 % IT SOLN
INTRATHECAL | Status: DC | PRN
  Administered 2020-11-10: 1.7 mL via INTRATHECAL

## 2020-11-10 MED ORDER — ONDANSETRON HCL 4 MG PO TABS: 4.0000 mg | ORAL_TABLET | Freq: Three times a day (TID) | ORAL | 0 refills | Status: DC | PRN

## 2020-11-10 MED ORDER — MIDAZOLAM HCL 2 MG/2ML IJ SOLN
INTRAMUSCULAR | Status: AC
  Filled 2020-11-10: qty 2

## 2020-11-10 MED ORDER — TRANEXAMIC ACID-NACL 1000-0.7 MG/100ML-% IV SOLN
1000.0000 mg | Freq: Once | INTRAVENOUS | Status: AC
  Administered 2020-11-10: 1000 mg via INTRAVENOUS
  Filled 2020-11-10: qty 100

## 2020-11-10 MED ORDER — MIDAZOLAM HCL 5 MG/5ML IJ SOLN
INTRAMUSCULAR | Status: DC | PRN
  Administered 2020-11-10 (×2): 2 mg via INTRAVENOUS

## 2020-11-10 MED ORDER — SODIUM CHLORIDE (PF) 0.9 % IJ SOLN
INTRAMUSCULAR | Status: AC
  Filled 2020-11-10: qty 10

## 2020-11-10 MED ORDER — LIDOCAINE 2% (20 MG/ML) 5 ML SYRINGE
INTRAMUSCULAR | Status: DC | PRN
  Administered 2020-11-10: 100 mg via INTRAVENOUS

## 2020-11-10 MED ORDER — ACETAMINOPHEN 500 MG PO TABS
1000.0000 mg | ORAL_TABLET | Freq: Four times a day (QID) | ORAL | Status: DC
  Administered 2020-11-10 – 2020-11-11 (×2): 1000 mg via ORAL
  Filled 2020-11-10 (×2): qty 2

## 2020-11-10 MED ORDER — BUPIVACAINE HCL 0.25 % IJ SOLN
INTRAMUSCULAR | Status: AC
  Filled 2020-11-10: qty 1

## 2020-11-10 MED ORDER — PIOGLITAZONE HCL 15 MG PO TABS
15.0000 mg | ORAL_TABLET | Freq: Every day | ORAL | Status: DC
  Administered 2020-11-10 – 2020-11-11 (×2): 15 mg via ORAL
  Filled 2020-11-10 (×2): qty 1

## 2020-11-10 MED ORDER — ALBUMIN HUMAN 5 % IV SOLN
INTRAVENOUS | Status: AC
  Filled 2020-11-10: qty 250

## 2020-11-10 MED ORDER — METHOCARBAMOL 1000 MG/10ML IJ SOLN
500.0000 mg | Freq: Four times a day (QID) | INTRAVENOUS | Status: DC | PRN
  Filled 2020-11-10: qty 5

## 2020-11-10 MED ORDER — PROPOFOL 500 MG/50ML IV EMUL
INTRAVENOUS | Status: AC
  Filled 2020-11-10: qty 50

## 2020-11-10 MED ORDER — CEFAZOLIN SODIUM-DEXTROSE 2-4 GM/100ML-% IV SOLN
2.0000 g | INTRAVENOUS | Status: AC
  Administered 2020-11-10: 2 g via INTRAVENOUS
  Filled 2020-11-10: qty 100

## 2020-11-10 MED ORDER — BUPIVACAINE HCL (PF) 0.25 % IJ SOLN
INTRAMUSCULAR | Status: DC | PRN
  Administered 2020-11-10: 30 mL

## 2020-11-10 MED ORDER — PHENYLEPHRINE 40 MCG/ML (10ML) SYRINGE FOR IV PUSH (FOR BLOOD PRESSURE SUPPORT)
PREFILLED_SYRINGE | INTRAVENOUS | Status: DC | PRN
  Administered 2020-11-10: 80 ug via INTRAVENOUS

## 2020-11-10 MED ORDER — BACLOFEN 10 MG PO TABS: 10.0000 mg | ORAL_TABLET | Freq: Three times a day (TID) | ORAL | 0 refills | Status: DC

## 2020-11-10 MED ORDER — KETOROLAC TROMETHAMINE 30 MG/ML IJ SOLN
INTRAMUSCULAR | Status: AC
  Filled 2020-11-10: qty 1

## 2020-11-10 MED ORDER — LEVOTHYROXINE SODIUM 75 MCG PO TABS
75.0000 ug | ORAL_TABLET | Freq: Every day | ORAL | Status: DC
  Administered 2020-11-11: 75 ug via ORAL
  Filled 2020-11-10: qty 1

## 2020-11-10 MED ORDER — ACETAMINOPHEN 10 MG/ML IV SOLN
INTRAVENOUS | Status: AC
  Filled 2020-11-10: qty 100

## 2020-11-10 MED ORDER — ONDANSETRON HCL 4 MG/2ML IJ SOLN
INTRAMUSCULAR | Status: DC | PRN
  Administered 2020-11-10: 4 mg via INTRAVENOUS

## 2020-11-10 MED ORDER — ACETAMINOPHEN 325 MG PO TABS: 325.0000 mg | ORAL_TABLET | Freq: Four times a day (QID) | ORAL | Status: DC | PRN

## 2020-11-10 MED ORDER — OXYCODONE HCL 5 MG PO TABS
5.0000 mg | ORAL_TABLET | Freq: Four times a day (QID) | ORAL | 0 refills | Status: DC | PRN
Start: 2020-11-10 — End: 2022-02-13

## 2020-11-10 MED ORDER — ACETAMINOPHEN 500 MG PO TABS: 1000.0000 mg | ORAL_TABLET | Freq: Once | ORAL | Status: DC | PRN

## 2020-11-10 MED ORDER — POVIDONE-IODINE 10 % EX SWAB
2.0000 "application " | Freq: Once | CUTANEOUS | Status: AC
  Administered 2020-11-10: 2 via TOPICAL

## 2020-11-10 MED ORDER — ASPIRIN EC 325 MG PO TBEC
325.0000 mg | DELAYED_RELEASE_TABLET | Freq: Two times a day (BID) | ORAL | Status: DC
  Administered 2020-11-11: 325 mg via ORAL
  Filled 2020-11-10: qty 1

## 2020-11-10 MED ORDER — CHLORHEXIDINE GLUCONATE 0.12 % MT SOLN
15.0000 mL | Freq: Once | OROMUCOSAL | Status: AC
  Administered 2020-11-10: 15 mL via OROMUCOSAL

## 2020-11-10 MED ORDER — OXYCODONE HCL 5 MG/5ML PO SOLN: 5.0000 mg | Freq: Once | ORAL | Status: DC | PRN

## 2020-11-10 MED ORDER — INSULIN GLARGINE 100 UNIT/ML ~~LOC~~ SOLN
34.0000 [IU] | Freq: Every day | SUBCUTANEOUS | Status: DC
  Administered 2020-11-10: 34 [IU] via SUBCUTANEOUS
  Filled 2020-11-10: qty 0.34

## 2020-11-10 MED ORDER — HYDROMORPHONE HCL 2 MG/ML IJ SOLN
INTRAMUSCULAR | Status: AC
  Filled 2020-11-10: qty 1

## 2020-11-10 MED ORDER — ACETAMINOPHEN 500 MG PO TABS
1000.0000 mg | ORAL_TABLET | Freq: Once | ORAL | Status: AC
  Administered 2020-11-10: 1000 mg via ORAL
  Filled 2020-11-10: qty 2

## 2020-11-10 MED ORDER — ORAL CARE MOUTH RINSE: 15.0000 mL | Freq: Once | OROMUCOSAL | Status: AC

## 2020-11-10 MED ORDER — PHENOL 1.4 % MT LIQD: 1.0000 | OROMUCOSAL | Status: DC | PRN

## 2020-11-10 MED ORDER — PROPOFOL 10 MG/ML IV BOLUS
INTRAVENOUS | Status: AC
  Filled 2020-11-10: qty 20

## 2020-11-10 MED ORDER — AMLODIPINE BESYLATE 10 MG PO TABS: 10.0000 mg | ORAL_TABLET | Freq: Every day | ORAL | Status: DC

## 2020-11-10 MED ORDER — ACETAMINOPHEN 10 MG/ML IV SOLN
1000.0000 mg | Freq: Once | INTRAVENOUS | Status: DC | PRN
  Administered 2020-11-10: 1000 mg via INTRAVENOUS

## 2020-11-10 MED ORDER — SENNA-DOCUSATE SODIUM 8.6-50 MG PO TABS: 2.0000 | ORAL_TABLET | Freq: Every day | ORAL | 1 refills | Status: AC

## 2020-11-10 MED ORDER — POTASSIUM CHLORIDE IN NACL 20-0.45 MEQ/L-% IV SOLN
INTRAVENOUS | Status: DC
  Filled 2020-11-10 (×3): qty 1000

## 2020-11-10 MED ORDER — KETOROLAC TROMETHAMINE 15 MG/ML IJ SOLN
15.0000 mg | Freq: Four times a day (QID) | INTRAMUSCULAR | Status: DC
  Administered 2020-11-10 – 2020-11-11 (×3): 15 mg via INTRAVENOUS
  Filled 2020-11-10 (×3): qty 1

## 2020-11-10 MED ORDER — ATORVASTATIN CALCIUM 20 MG PO TABS
20.0000 mg | ORAL_TABLET | Freq: Every day | ORAL | Status: DC
  Administered 2020-11-11: 20 mg via ORAL
  Filled 2020-11-10: qty 1

## 2020-11-10 MED ORDER — DIPHENHYDRAMINE HCL 12.5 MG/5ML PO ELIX: 12.5000 mg | ORAL_SOLUTION | ORAL | Status: DC | PRN

## 2020-11-10 MED ORDER — STERILE WATER FOR IRRIGATION IR SOLN
Status: DC | PRN
  Administered 2020-11-10: 1000 mL

## 2020-11-10 MED ORDER — DEXAMETHASONE SODIUM PHOSPHATE 10 MG/ML IJ SOLN
INTRAMUSCULAR | Status: DC | PRN
  Administered 2020-11-10: 10 mg via INTRAVENOUS

## 2020-11-10 MED ORDER — FENTANYL CITRATE (PF) 100 MCG/2ML IJ SOLN
INTRAMUSCULAR | Status: DC | PRN
  Administered 2020-11-10: 100 ug via INTRAVENOUS

## 2020-11-10 MED ORDER — ONDANSETRON HCL 4 MG/2ML IJ SOLN
INTRAMUSCULAR | Status: AC
  Filled 2020-11-10: qty 2

## 2020-11-10 MED ORDER — HYDROMORPHONE HCL 1 MG/ML IJ SOLN
INTRAMUSCULAR | Status: DC | PRN
Start: 2020-11-10 — End: 2020-11-10
  Administered 2020-11-10 (×5): .4 mg via INTRAVENOUS

## 2020-11-10 MED ORDER — ALBUMIN HUMAN 5 % IV SOLN: INTRAVENOUS | Status: DC | PRN

## 2020-11-10 MED ORDER — METOCLOPRAMIDE HCL 5 MG/ML IJ SOLN
5.0000 mg | Freq: Three times a day (TID) | INTRAMUSCULAR | Status: DC | PRN
  Administered 2020-11-10 – 2020-11-11 (×2): 10 mg via INTRAVENOUS
  Filled 2020-11-10 (×2): qty 2

## 2020-11-10 MED ORDER — OXYCODONE-ACETAMINOPHEN 5-325 MG PO TABS: 1.0000 | ORAL_TABLET | Freq: Four times a day (QID) | ORAL | Status: DC | PRN

## 2020-11-10 MED ORDER — PHENYLEPHRINE 40 MCG/ML (10ML) SYRINGE FOR IV PUSH (FOR BLOOD PRESSURE SUPPORT)
PREFILLED_SYRINGE | INTRAVENOUS | Status: AC
  Filled 2020-11-10: qty 10

## 2020-11-10 MED ORDER — METOCLOPRAMIDE HCL 5 MG PO TABS: 5.0000 mg | ORAL_TABLET | Freq: Three times a day (TID) | ORAL | Status: DC | PRN

## 2020-11-10 MED ORDER — DEXAMETHASONE SODIUM PHOSPHATE 10 MG/ML IJ SOLN
INTRAMUSCULAR | Status: AC
  Filled 2020-11-10: qty 1

## 2020-11-10 MED ORDER — LOSARTAN POTASSIUM 50 MG PO TABS
100.0000 mg | ORAL_TABLET | Freq: Every day | ORAL | Status: DC
  Filled 2020-11-10: qty 2

## 2020-11-10 MED ORDER — DOCUSATE SODIUM 100 MG PO CAPS
100.0000 mg | ORAL_CAPSULE | Freq: Two times a day (BID) | ORAL | Status: DC
  Administered 2020-11-10 – 2020-11-11 (×2): 100 mg via ORAL
  Filled 2020-11-10 (×2): qty 1

## 2020-11-10 MED ORDER — BISACODYL 10 MG RE SUPP: 10.0000 mg | Freq: Every day | RECTAL | Status: DC | PRN

## 2020-11-10 MED ORDER — DEXAMETHASONE SODIUM PHOSPHATE 10 MG/ML IJ SOLN
10.0000 mg | Freq: Once | INTRAMUSCULAR | Status: AC
  Administered 2020-11-11: 10 mg via INTRAVENOUS
  Filled 2020-11-10: qty 1

## 2020-11-10 MED ORDER — PANTOPRAZOLE SODIUM 40 MG PO TBEC
80.0000 mg | DELAYED_RELEASE_TABLET | Freq: Every day | ORAL | Status: DC
  Administered 2020-11-11: 80 mg via ORAL
  Filled 2020-11-10: qty 2

## 2020-11-10 MED ORDER — ONDANSETRON HCL 4 MG PO TABS
4.0000 mg | ORAL_TABLET | Freq: Four times a day (QID) | ORAL | Status: DC | PRN
  Administered 2020-11-11: 4 mg via ORAL
  Filled 2020-11-10: qty 1

## 2020-11-10 MED ORDER — ASPIRIN EC 325 MG PO TBEC: 325.0000 mg | DELAYED_RELEASE_TABLET | Freq: Two times a day (BID) | ORAL | 0 refills | Status: DC

## 2020-11-10 MED ORDER — MENTHOL 3 MG MT LOZG: 1.0000 | LOZENGE | OROMUCOSAL | Status: DC | PRN

## 2020-11-10 MED ORDER — POLYETHYLENE GLYCOL 3350 17 G PO PACK: 17.0000 g | PACK | Freq: Every day | ORAL | Status: DC | PRN

## 2020-11-10 MED ORDER — LIRAGLUTIDE 18 MG/3ML ~~LOC~~ SOPN: 1.8000 mg | PEN_INJECTOR | Freq: Every day | SUBCUTANEOUS | Status: DC

## 2020-11-10 MED ORDER — PHENYLEPHRINE HCL-NACL 10-0.9 MG/250ML-% IV SOLN
INTRAVENOUS | Status: DC | PRN
  Administered 2020-11-10: 25 ug/min via INTRAVENOUS

## 2020-11-10 MED ORDER — VALACYCLOVIR HCL 500 MG PO TABS
500.0000 mg | ORAL_TABLET | Freq: Every day | ORAL | Status: DC
  Administered 2020-11-10 – 2020-11-11 (×2): 500 mg via ORAL
  Filled 2020-11-10 (×2): qty 1

## 2020-11-10 MED ORDER — OXYCODONE HCL 5 MG PO TABS
10.0000 mg | ORAL_TABLET | ORAL | Status: DC | PRN
  Administered 2020-11-10 (×2): 15 mg via ORAL
  Administered 2020-11-11: 10 mg via ORAL
  Administered 2020-11-11: 15 mg via ORAL
  Filled 2020-11-10 (×3): qty 3

## 2020-11-10 MED ORDER — KETOROLAC TROMETHAMINE 30 MG/ML IJ SOLN
INTRAMUSCULAR | Status: DC | PRN
  Administered 2020-11-10: 30 mg

## 2020-11-10 MED ORDER — ACETAMINOPHEN 160 MG/5ML PO SOLN: 1000.0000 mg | Freq: Once | ORAL | Status: DC | PRN

## 2020-11-10 MED ORDER — LACTATED RINGERS IV SOLN: INTRAVENOUS | Status: DC

## 2020-11-10 SURGICAL SUPPLY — 60 items
BIT DRILL 2.0X128 (BIT) ×2 IMPLANT
BIT DRILL 2.0X128MM (BIT) ×1
BLADE SAW SAG 73X25 THK (BLADE) ×2
BLADE SAW SGTL 73X25 THK (BLADE) ×1 IMPLANT
CLOSURE STERI-STRIP 1/2X4 (GAUZE/BANDAGES/DRESSINGS) ×1
CLSR STERI-STRIP ANTIMIC 1/2X4 (GAUZE/BANDAGES/DRESSINGS) ×3 IMPLANT
COVER SURGICAL LIGHT HANDLE (MISCELLANEOUS) ×3 IMPLANT
COVER WAND RF STERILE (DRAPES) IMPLANT
CUP SECTOR GRIPTON 50MM (Cup) ×2 IMPLANT
DRAPE INCISE IOBAN 66X45 STRL (DRAPES) ×3 IMPLANT
DRAPE ORTHO SPLIT 77X108 STRL (DRAPES) ×6
DRAPE POUCH INSTRU U-SHP 10X18 (DRAPES) ×3 IMPLANT
DRAPE SHEET LG 3/4 BI-LAMINATE (DRAPES) ×3 IMPLANT
DRAPE SURG 17X11 SM STRL (DRAPES) ×3 IMPLANT
DRAPE SURG ORHT 6 SPLT 77X108 (DRAPES) ×2 IMPLANT
DRAPE U-SHAPE 47X51 STRL (DRAPES) ×3 IMPLANT
DRSG MEPILEX BORDER 4X8 (GAUZE/BANDAGES/DRESSINGS) ×3 IMPLANT
DURAPREP 26ML APPLICATOR (WOUND CARE) ×6 IMPLANT
ELECT BLADE TIP CTD 4 INCH (ELECTRODE) ×3 IMPLANT
ELECT REM PT RETURN 15FT ADLT (MISCELLANEOUS) ×3 IMPLANT
ELIMINATOR HOLE APEX DEPUY (Hips) ×2 IMPLANT
FACESHIELD WRAPAROUND (MASK) ×3 IMPLANT
FACESHIELD WRAPAROUND OR TEAM (MASK) ×1 IMPLANT
GLOVE BIO SURGEON STRL SZ7 (GLOVE) ×3 IMPLANT
GLOVE BIO SURGEON STRL SZ7.5 (GLOVE) ×3 IMPLANT
GLOVE BIOGEL PI IND STRL 8 (GLOVE) ×1 IMPLANT
GLOVE BIOGEL PI INDICATOR 8 (GLOVE) ×2
GLOVE INDICATOR 6.5 STRL GRN (GLOVE) ×3 IMPLANT
GOWN STRL REUS W/TWL LRG LVL3 (GOWN DISPOSABLE) ×6 IMPLANT
HEAD FEMORAL 32 CERAMIC (Hips) ×2 IMPLANT
HOOD PEEL AWAY FLYTE STAYCOOL (MISCELLANEOUS) ×9 IMPLANT
KIT BASIN OR (CUSTOM PROCEDURE TRAY) ×3 IMPLANT
KIT TURNOVER KIT A (KITS) ×3 IMPLANT
LINER ACETABULAR 32X50 (Liner) ×2 IMPLANT
MANIFOLD NEPTUNE II (INSTRUMENTS) ×3 IMPLANT
NDL MA TROC 1/2 (NEEDLE) IMPLANT
NDL SAFETY ECLIPSE 18X1.5 (NEEDLE) ×2 IMPLANT
NEEDLE HYPO 18GX1.5 SHARP (NEEDLE) ×6
NEEDLE MA TROC 1/2 (NEEDLE) IMPLANT
NS IRRIG 1000ML POUR BTL (IV SOLUTION) ×3 IMPLANT
PACK TOTAL JOINT (CUSTOM PROCEDURE TRAY) ×3 IMPLANT
PENCIL SMOKE EVACUATOR (MISCELLANEOUS) IMPLANT
PROTECTOR NERVE ULNAR (MISCELLANEOUS) ×3 IMPLANT
RETRIEVER SUT HEWSON (MISCELLANEOUS) ×3 IMPLANT
SCREW 6.5MMX25MM (Screw) ×2 IMPLANT
STEM SUMMIT STD 4 (Stem) ×2 IMPLANT
SUCTION FRAZIER HANDLE 12FR (TUBING) ×3
SUCTION TUBE FRAZIER 12FR DISP (TUBING) ×1 IMPLANT
SUT FIBERWIRE #2 38 T-5 BLUE (SUTURE) ×9
SUT VIC AB 0 CT1 36 (SUTURE) ×3 IMPLANT
SUT VIC AB 1 CT1 36 (SUTURE) ×6 IMPLANT
SUT VIC AB 2-0 CT1 27 (SUTURE) ×6
SUT VIC AB 2-0 CT1 TAPERPNT 27 (SUTURE) ×2 IMPLANT
SUT VIC AB 3-0 SH 8-18 (SUTURE) ×3 IMPLANT
SUTURE FIBERWR #2 38 T-5 BLUE (SUTURE) ×3 IMPLANT
SYR CONTROL 10ML LL (SYRINGE) ×6 IMPLANT
TOWEL OR 17X26 10 PK STRL BLUE (TOWEL DISPOSABLE) ×3 IMPLANT
TRAY FOLEY MTR SLVR 16FR STAT (SET/KITS/TRAYS/PACK) ×3 IMPLANT
WATER STERILE IRR 1000ML POUR (IV SOLUTION) ×6 IMPLANT
YANKAUER SUCT BULB TIP 10FT TU (MISCELLANEOUS) ×2 IMPLANT

## 2020-11-10 NOTE — Anesthesia Procedure Notes (Signed)
Procedure Name: LMA Insertion Date/Time: 11/10/2020 12:54 PM Performed by: Janeece Riggers, MD Patient Re-evaluated:Patient Re-evaluated prior to induction Oxygen Delivery Method: Circle system utilized Preoxygenation: Pre-oxygenation with 100% oxygen Induction Type: IV induction LMA: LMA inserted LMA Size: 4.0 Number of attempts: 1 Placement Confirmation: positive ETCO2 and breath sounds checked- equal and bilateral Tube secured with: Tape Dental Injury: Teeth and Oropharynx as per pre-operative assessment

## 2020-11-10 NOTE — Evaluation (Signed)
Physical Therapy Evaluation Patient Details Name: Morgan Taylor MRN: [id number] DOB: October 19, 1961 Today's Date: 11/10/2020   History of Present Illness  Patient is 59 y.o. female s/p Lt THA via posterolateral approach on 11/10/20 with PMH significant for HTN, HLD, GERD, DM, depression, anxiety, OA, breast cancer, Bil TSA, Bil TKA.     Clinical Impression  Morgan Taylor is a 59 y.o. female POD 0 s/p Lt THA. Patient reports independence with mobility at baseline. Patient is now limited by functional impairments (see PT problem list below) and requires min assist for transfers and gait with RW. Patient was able to ambulate ~45 feet with RW and min assist. Patient instructed in exercise to facilitate circulation. Patient will benefit from continued skilled PT interventions to address impairments and progress towards PLOF. Acute PT will follow to progress mobility and stair training in preparation for safe discharge home.     Follow Up Recommendations Follow surgeon's recommendation for DC plan and follow-up therapies;Home health PT    Equipment Recommendations  Rolling walker with 5" wheels    Recommendations for Other Services       Precautions / Restrictions Precautions Precautions: Fall Restrictions Weight Bearing Restrictions: No Other Position/Activity Restrictions: WBAT      Mobility  Bed Mobility Overal bed mobility: Needs Assistance Bed Mobility: Supine to Sit     Supine to sit: Min assist;HOB elevated     General bed mobility comments: Assist for Lt LE mobility and to maintain posterior hip precautions. cues to use bed rail and assist to raise trunk and scoot forward.     Transfers Overall transfer level: Needs assistance Equipment used: Rolling walker (2 wheeled) Transfers: Sit to/from Stand Sit to Stand: Min assist         General transfer comment: cues to maintain posterior hip precautions. assist required for power up and to steady with rising.    Ambulation/Gait Ambulation/Gait assistance: Min assist Gait Distance (Feet): 45 Feet Assistive device: Rolling walker (2 wheeled) Gait Pattern/deviations: Step-to pattern;Decreased stride length;Decreased weight shift to left Gait velocity: decr   General Gait Details: cues for safe step pattern and proximity to RW, no overt LOB noted.   Stairs            Wheelchair Mobility    Modified Rankin (Stroke Patients Only)       Balance Overall balance assessment: Needs assistance Sitting-balance support: Feet supported Sitting balance-Leahy Scale: Good     Standing balance support: During functional activity;Bilateral upper extremity supported Standing balance-Leahy Scale: Fair                               Pertinent Vitals/Pain Pain Assessment: 0-10 Pain Score: 8  Pain Location: Lt hip Pain Descriptors / Indicators: Aching;Discomfort Pain Intervention(s): Limited activity within patient's tolerance;Monitored during session;Repositioned    Home Living Family/patient expects to be discharged to:: Private residence Living Arrangements: Children Available Help at Discharge: Family Type of Home: Apartment (duplex) Home Access: Stairs to enter Entrance Stairs-Rails: None Entrance Stairs-Number of Steps: 4-5 Home Layout: One level Home Equipment: Shower seat;Toilet riser Additional Comments: pt's son can help her at home, and her daughter is able to help as well.    Prior Function Level of Independence: Independent               Hand Dominance   Dominant Hand: Right    Extremity/Trunk Assessment   Upper Extremity Assessment Upper Extremity Assessment: Overall  WFL for tasks assessed    Lower Extremity Assessment Lower Extremity Assessment: Overall WFL for tasks assessed;LLE deficits/detail LLE Deficits / Details: good quad set and plantar/dorsiflexion LLE Sensation: WNL LLE Coordination: WNL    Cervical / Trunk Assessment Cervical /  Trunk Assessment: Normal  Communication   Communication: No difficulties  Cognition Arousal/Alertness: Awake/alert Behavior During Therapy: WFL for tasks assessed/performed Overall Cognitive Status: Within Functional Limits for tasks assessed                                        General Comments      Exercises Total Joint Exercises Ankle Circles/Pumps: AROM;Both;20 reps;Seated   Assessment/Plan    PT Assessment Patient needs continued PT services  PT Problem List Decreased strength;Decreased range of motion;Decreased activity tolerance;Decreased balance;Decreased mobility;Decreased knowledge of use of DME;Decreased knowledge of precautions;Pain       PT Treatment Interventions DME instruction;Gait training;Stair training;Functional mobility training;Therapeutic activities;Therapeutic exercise;Balance training;Patient/family education    PT Goals (Current goals can be found in the Care Plan section)  Acute Rehab PT Goals Patient Stated Goal: recover independence PT Goal Formulation: With patient Time For Goal Achievement: 11/17/20 Potential to Achieve Goals: Good    Frequency 7X/week   Barriers to discharge        Co-evaluation               AM-PAC PT "6 Clicks" Mobility  Outcome Measure Help needed turning from your back to your side while in a flat bed without using bedrails?: A Little Help needed moving from lying on your back to sitting on the side of a flat bed without using bedrails?: A Little Help needed moving to and from a bed to a chair (including a wheelchair)?: A Little Help needed standing up from a chair using your arms (e.g., wheelchair or bedside chair)?: A Little Help needed to walk in hospital room?: A Little Help needed climbing 3-5 steps with a railing? : A Lot 6 Click Score: 17    End of Session Equipment Utilized During Treatment: Gait belt Activity Tolerance: Patient tolerated treatment well Patient left: in chair;with  call bell/phone within reach;with chair alarm set;with nursing/sitter in room Nurse Communication: Mobility status PT Visit Diagnosis: Muscle weakness (generalized) (M62.81);Difficulty in walking, not elsewhere classified (R26.2);Pain Pain - Right/Left: Left Pain - part of body: Hip    Time: 8828-0034 PT Time Calculation (min) (ACUTE ONLY): 30 min   Charges:   PT Evaluation $PT Eval Low Complexity: 1 Low PT Treatments $Gait Training: 8-22 mins        Verner Mould, DPT Acute Rehabilitation Services  Office [phone] Pager [phone]  11/10/2020 6:52 PM

## 2020-11-10 NOTE — Anesthesia Preprocedure Evaluation (Addendum)
Anesthesia Evaluation  Patient identified by MRN, date of birth, ID band Patient awake    Reviewed: Allergy & Precautions, NPO status , Patient's Chart, lab work & pertinent test results  History of Anesthesia Complications Negative for: history of anesthetic complications  Airway Mallampati: III  TM Distance: >3 FB Neck ROM: Full    Dental  (+) Dental Advisory Given   Pulmonary neg shortness of breath, neg COPD, neg recent URI, former smoker,  Covid-19 Nucleic Acid Test Results Lab Results      Component                Value               Date                      SARSCOV2NAA              NEGATIVE            11/06/2020              breath sounds clear to auscultation       Cardiovascular hypertension, Pt. on medications (-) angina(-) Past MI and (-) CHF (-) dysrhythmias  Rhythm:Regular     Neuro/Psych PSYCHIATRIC DISORDERS Anxiety Depression negative neurological ROS     GI/Hepatic Neg liver ROS, GERD  Medicated and Controlled,  Endo/Other  diabetesHypothyroidism   Renal/GU negative Renal ROSLab Results      Component                Value               Date                      CREATININE               0.99                11/02/2020                Musculoskeletal  (+) Arthritis ,   Abdominal   Peds  Hematology   Anesthesia Other Findings   Reproductive/Obstetrics                            Anesthesia Physical Anesthesia Plan  ASA: II  Anesthesia Plan: MAC and Spinal   Post-op Pain Management:    Induction: Intravenous  PONV Risk Score and Plan: 2 and Propofol infusion and Treatment may vary due to age or medical condition  Airway Management Planned: Nasal Cannula  Additional Equipment: None  Intra-op Plan:   Post-operative Plan:   Informed Consent: I have reviewed the patients History and Physical, chart, labs and discussed the procedure including the risks, benefits and  alternatives for the proposed anesthesia with the patient or authorized representative who has indicated his/her understanding and acceptance.     Dental advisory given  Plan Discussed with: CRNA and Surgeon  Anesthesia Plan Comments:         Anesthesia Quick Evaluation

## 2020-11-10 NOTE — Op Note (Signed)
11/10/2020  2:17 PM  PATIENT:  Morgan Taylor   MRN: [id number]  PRE-OPERATIVE DIAGNOSIS: Left hip primary localized osteoarthritis  POST-OPERATIVE DIAGNOSIS:  same  PROCEDURE:  Procedure(s): TOTAL HIP ARTHROPLASTY  PREOPERATIVE INDICATIONS:    Morgan Taylor is an 59 y.o. female who has a diagnosis of left hip primary localized osteoarthritis and elected for surgical management after failing conservative treatment.  The risks benefits and alternatives were discussed with the patient including but not limited to the risks of nonoperative treatment, versus surgical intervention including infection, bleeding, nerve injury, periprosthetic fracture, the need for revision surgery, dislocation, leg length discrepancy, blood clots, cardiopulmonary complications, morbidity, mortality, among others, and they were willing to proceed.     OPERATIVE REPORT     SURGEON:  Marchia Bond, MD    ASSISTANT:  Merlene Pulling, PA-C, (Present throughout the entire procedure,  necessary for completion of procedure in a timely manner, assisting with retraction, instrumentation, and closure)     ANESTHESIA: Spinal converted to general  ESTIMATED BLOOD LOSS: 295 mL    COMPLICATIONS:  None.     UNIQUE ASPECTS OF THE CASE: Her acetabulum was fairly shallow.  I had a little bit more medial depth that I could take, the bone was extremely sclerotic, I had to repeat the reaming process because I was too shallow, without enough stick after the first round of reaming.  Even after repeat reaming, I was still somewhat sclerotic, I was showing just a little bit of the anterior wall, the superior felt like I was matching pretty close, although the posterior wall of the cup had a fair amount of beads showing.  I suspect I was still somewhat prominent, because she was so shallow, which ultimately made reducing the femur extremely difficult.  Additionally, is very hard to abduct the femur enough to deliver it so that I could get  access down the shaft.  I had a fairly difficult time throughout the case.  I downsized multiple times, countersinking the 3 broach and then removing more bone with a calcar planer in order to ultimately be capable of reducing the hip.  I do not think I was overly anteverted, but the cup was definitely hanging out posteriorly.  I ended up using a 0 polyethylene.  During the depths of the acetabular exposure, as I was releasing the capsule from the inside, I did get one episode of sciatic nerve stimulation, I suspect it was because the Bovie may have thrown a spark to the posterior retractor, I inspected the nerve at the completion of the case and I did not find any areas of hemorrhage, or disruption.  It was sitting just at the bottom level of the capsular release posteriorly.  COMPONENTS:  [employer] fit femur size 4 with a 32 mm + 1 ceramic head ball and a Gription Acetabular shell size 50, with a single cancellous screw for backup fixation, with an apex hole eliminator and a +0 neutral polyethylene liner.    PROCEDURE IN DETAIL:   The patient was met in the holding area and  identified.  The appropriate hip was identified and marked at the operative site.  The patient was then transported to the OR  and  placed under anesthesia.  At that point, the patient was  placed in the lateral decubitus position with the operative side up and  secured to the operating room table and all bony prominences padded.     The operative lower extremity was  prepped from the iliac crest to the distal leg.  Sterile draping was performed.  Time out was performed prior to incision.      A routine posterolateral approach was utilized via sharp dissection  carried down to the subcutaneous tissue.  Gross bleeders were Bovie coagulated.  The iliotibial band was identified and incised along the length of the skin incision.  Self-retaining retractors were  inserted.  With the hip internally rotated, the short  external rotators  were identified. The piriformis and capsule was tagged with FiberWire, and the hip capsule released in a T-type fashion.  The femoral neck was exposed, and I resected the femoral neck using the appropriate jig. This was performed at approximately a thumb's breadth above the lesser trochanter.    I then exposed the deep acetabulum, cleared out any tissue including the ligamentum teres.  A wing retractor was placed.  After adequate visualization, I excised the labrum, and then sequentially reamed.  I placed the trial acetabulum.  Initially, it would not completely seat, and was fairly prominent, so I went back and medialized, and delivered down more fully.  I had better seating, and actually had some degree of press-fit hold.  Appropriate version and inclination was confirmed clinically matching their bony anatomy, and also with the use of the jig.  This step was challenging because she was so shallow.  I placed a cancellous screw to augment fixation.  The screw had excellent fixation, and required a 2 hand placement technique to get fully seated.  A trial polyethylene liner was placed and the wing retractor removed.    I then prepared the proximal femur using the cookie-cutter, the lateralizing reamer, and then sequentially reamed and broached.  It was difficult to get the line because her femur would not deliver very well.  A trial broach, neck, and head was utilized.  I had extreme difficulty reducing the trial, I went back and went to the 0 polyethylene liner although they did not have a 32 available, so I used a 28, at least to get some degree of stimulation.  I still could not reduce the hip, it was too tight, so I countersunk a 3 broach and used a calcar reamer, I still could not get it reduced, so I did it again, and ultimately was able to get the hip reduced with some difficulty.  The hip was found to have excellent stability with functional range of motion. The trial components  were then removed, and the real polyethylene liner was placed.  I also placed a apex hole eliminator.  I then impacted the real femoral prosthesis into place into the appropriate version, and I impacted the real head ball into place. The hip was then reduced and taken through functional range of motion and found to have excellent stability. Leg lengths were restored.  I then used a 2 mm drill bits to pass the FiberWire suture from the capsule and piriformis through the greater trochanter, and secured this. Excellent posterior capsular repair was achieved. I also closed the T in the capsule.  I closely inspected the sciatic nerve given the one episode of stimulation during the case, it was found to be intact.  I then irrigated the hip copiously again with pulse lavage, and repaired the fascia with Vicryl, followed by Vicryl for the subcutaneous tissue, Monocryl for the skin, Steri-Strips and sterile gauze. The wounds were injected. The patient was then awakened and returned to PACU in stable and satisfactory condition. There were  no complications.  Marchia Bond, MD Orthopedic Surgeon [phone]   11/10/2020 2:17 PM

## 2020-11-10 NOTE — Discharge Instructions (Signed)
INSTRUCTIONS AFTER JOINT REPLACEMENT   o Remove items at home which could result in a fall. This includes throw rugs or furniture in walking pathways o ICE to the affected joint every three hours while awake for 30 minutes at a time, for at least the first 3-5 days, and then as needed for pain and swelling.  Continue to use ice for pain and swelling. You may notice swelling that will progress down to the foot and ankle.  This is normal after surgery.  Elevate your leg when you are not up walking on it.   o Continue to use the breathing machine you got in the hospital (incentive spirometer) which will help keep your temperature down.  It is common for your temperature to cycle up and down following surgery, especially at night when you are not up moving around and exerting yourself.  The breathing machine keeps your lungs expanded and your temperature down.   DIET:  As you were doing prior to hospitalization, we recommend a well-balanced diet.  DRESSING / WOUND CARE / SHOWERING  You may change your dressing 3-5 days after surgery.  Then change the dressing every day with sterile gauze.  Please use good hand washing techniques before changing the dressing.  Do not use any lotions or creams on the incision until instructed by your surgeon.  ACTIVITY  o Increase activity slowly as tolerated, but follow the weight bearing instructions below.   o No driving for 6 weeks or until further direction given by your physician.  You cannot drive while taking narcotics.  o No lifting or carrying greater than 10 lbs. until further directed by your surgeon. o Avoid periods of inactivity such as sitting longer than an hour when not asleep. This helps prevent blood clots.  o You may return to work once you are authorized by your doctor.     WEIGHT BEARING   Weight bearing as tolerated with assist device (walker, cane, etc) as directed, use it as long as suggested by your surgeon or therapist, typically at  least 4-6 weeks.   EXERCISES  Results after joint replacement surgery are often greatly improved when you follow the exercise, range of motion and muscle strengthening exercises prescribed by your doctor. Safety measures are also important to protect the joint from further injury. Any time any of these exercises cause you to have increased pain or swelling, decrease what you are doing until you are comfortable again and then slowly increase them. If you have problems or questions, call your caregiver or physical therapist for advice.   Rehabilitation is important following a joint replacement. After just a few days of immobilization, the muscles of the leg can become weakened and shrink (atrophy).  These exercises are designed to build up the tone and strength of the thigh and leg muscles and to improve motion. Often times heat used for twenty to thirty minutes before working out will loosen up your tissues and help with improving the range of motion but do not use heat for the first two weeks following surgery (sometimes heat can increase post-operative swelling).   These exercises can be done on a training (exercise) mat, on the floor, on a table or on a bed. Use whatever works the best and is most comfortable for you.    Use music or television while you are exercising so that the exercises are a pleasant break in your day. This will make your life better with the exercises acting as a break   in your routine that you can look forward to.   Perform all exercises about fifteen times, three times per day or as directed.  You should exercise both the operative leg and the other leg as well.  Exercises include:   . Quad Sets - Tighten up the muscle on the front of the thigh (Quad) and hold for 5-10 seconds.   . Straight Leg Raises - With your knee straight (if you were given a brace, keep it on), lift the leg to 60 degrees, hold for 3 seconds, and slowly lower the leg.  Perform this exercise against  resistance later as your leg gets stronger.  . Leg Slides: Lying on your back, slowly slide your foot toward your buttocks, bending your knee up off the floor (only go as far as is comfortable). Then slowly slide your foot back down until your leg is flat on the floor again.  . Angel Wings: Lying on your back spread your legs to the side as far apart as you can without causing discomfort.  . Hamstring Strength:  Lying on your back, push your heel against the floor with your leg straight by tightening up the muscles of your buttocks.  Repeat, but this time bend your knee to a comfortable angle, and push your heel against the floor.  You may put a pillow under the heel to make it more comfortable if necessary.   A rehabilitation program following joint replacement surgery can speed recovery and prevent re-injury in the future due to weakened muscles. Contact your doctor or a physical therapist for more information on knee rehabilitation.    CONSTIPATION  Constipation is defined medically as fewer than three stools per week and severe constipation as less than one stool per week.  Even if you have a regular bowel pattern at home, your normal regimen is likely to be disrupted due to multiple reasons following surgery.  Combination of anesthesia, postoperative narcotics, change in appetite and fluid intake all can affect your bowels.   YOU MUST use at least one of the following options; they are listed in order of increasing strength to get the job done.  They are all available over the counter, and you may need to use some, POSSIBLY even all of these options:    Drink plenty of fluids (prune juice may be helpful) and high fiber foods Colace 100 mg by mouth twice a day  Senokot for constipation as directed and as needed Dulcolax (bisacodyl), take with full glass of water  Miralax (polyethylene glycol) once or twice a day as needed.  If you have tried all these things and are unable to have a bowel  movement in the first 3-4 days after surgery call either your surgeon or your primary doctor.    If you experience loose stools or diarrhea, hold the medications until you stool forms back up.  If your symptoms do not get better within 1 week or if they get worse, check with your doctor.  If you experience "the worst abdominal pain ever" or develop nausea or vomiting, please contact the office immediately for further recommendations for treatment.   ITCHING:  If you experience itching with your medications, try taking only a single pain pill, or even half a pain pill at a time.  You can also use Benadryl over the counter for itching or also to help with sleep.   TED HOSE STOCKINGS:  Use stockings on both legs until for at least 2 weeks or as   directed by physician office. They may be removed at night for sleeping.  MEDICATIONS:  See your medication summary on the "After Visit Summary" that nursing will review with you.  You may have some home medications which will be placed on hold until you complete the course of blood thinner medication.  It is important for you to complete the blood thinner medication as prescribed.  PRECAUTIONS:  If you experience chest pain or shortness of breath - call 911 immediately for transfer to the hospital emergency department.   If you develop a fever greater that 101 F, purulent drainage from wound, increased redness or drainage from wound, foul odor from the wound/dressing, or calf pain - CONTACT YOUR SURGEON.                                                   FOLLOW-UP APPOINTMENTS:  If you do not already have a post-op appointment, please call the office for an appointment to be seen by your surgeon.  Guidelines for how soon to be seen are listed in your "After Visit Summary", but are typically between 1-4 weeks after surgery.  OTHER INSTRUCTIONS:    DENTAL ANTIBIOTICS:  In most cases prophylactic antibiotics for Dental procdeures after total joint surgery are  not necessary.  Exceptions are as follows:  1. History of prior total joint infection  2. Severely immunocompromised (Organ Transplant, cancer chemotherapy, Rheumatoid biologic meds such as [place])  3. Poorly controlled diabetes (A1C &gt; 8.0, blood glucose over 200)  If you have one of these conditions, contact your surgeon for an antibiotic prescription, prior to your dental procedure.   MAKE SURE YOU:  . Understand these instructions.  . Get help right away if you are not doing well or get worse.    Thank you for letting us be a part of your medical care team.  It is a privilege we respect greatly.  We hope these instructions will help you stay on track for a fast and full recovery!

## 2020-11-10 NOTE — Transfer of Care (Signed)
Immediate Anesthesia Transfer of Care Note  Patient: SHARLOTTE BAKA  Procedure(s) Performed: TOTAL HIP ARTHROPLASTY (Left Hip)  Patient Location: PACU  Anesthesia Type:General  Level of Consciousness: awake and alert   Airway & Oxygen Therapy: Patient Spontanous Breathing and Patient connected to face mask oxygen  Post-op Assessment: Report given to RN and Post -op Vital signs reviewed and stable  Post vital signs: Reviewed and stable  Last Vitals:  Vitals Value Taken Time  BP 119/79 11/10/20 1449  Temp    Pulse 96 11/10/20 1450  Resp 17 11/10/20 1450  SpO2 100 % 11/10/20 1450  Vitals shown include unvalidated device data.  Last Pain:  Vitals:   11/10/20 0837  TempSrc:   PainSc: 0-No pain         Complications: No complications documented.

## 2020-11-10 NOTE — Interval H&P Note (Signed)
History and Physical Interval Note:  11/10/2020 9:28 AM  Morgan Taylor  has presented today for surgery, with the diagnosis of OA LEFT HIP.  The various methods of treatment have been discussed with the patient and family. After consideration of risks, benefits and other options for treatment, the patient has consented to  Procedure(s): TOTAL HIP ARTHROPLASTY (Left) as a surgical intervention.  The patient's history has been reviewed, patient examined, no change in status, stable for surgery.  I have reviewed the patient's chart and labs.  Questions were answered to the patient's satisfaction.    The risks benefits and alternatives were discussed with the patient including but not limited to the risks of nonoperative treatment, versus surgical intervention including infection, bleeding, nerve injury, periprosthetic fracture, the need for revision surgery, dislocation, leg length discrepancy, blood clots, cardiopulmonary complications, morbidity, mortality, among others, and they were willing to proceed.     [place]

## 2020-11-10 NOTE — Anesthesia Procedure Notes (Signed)
Date/Time: 11/10/2020 11:04 AM Performed by: Sharlette Dense, CRNA Oxygen Delivery Method: Simple face mask

## 2020-11-10 NOTE — Anesthesia Procedure Notes (Signed)
Spinal  Patient location during procedure: OR Start time: 11/10/2020 11:09 AM End time: 11/10/2020 11:14 AM Preanesthetic Checklist Completed: patient identified, IV checked, site marked, risks and benefits discussed, surgical consent, monitors and equipment checked, pre-op evaluation and timeout performed Spinal Block Patient position: sitting Prep: DuraPrep Patient monitoring: heart rate, cardiac monitor, continuous pulse ox and blood pressure Approach: midline Location: L4-5 Injection technique: single-shot Needle Needle type: Sprotte  Needle gauge: 24 G Needle length: 9 cm Assessment Sensory level: T6

## 2020-11-11 DIAGNOSIS — M1612 Unilateral primary osteoarthritis, left hip: Secondary | ICD-10-CM | POA: Diagnosis not present

## 2020-11-11 LAB — CBC
HCT: 28.6 % — ABNORMAL LOW (ref 36.0–46.0)
Hemoglobin: 9.2 g/dL — ABNORMAL LOW (ref 12.0–15.0)
MCH: 30.8 pg (ref 26.0–34.0)
MCHC: 32.2 g/dL (ref 30.0–36.0)
MCV: 95.7 fL (ref 80.0–100.0)
Platelets: 194 10*3/uL (ref 150–400)
RBC: 2.99 MIL/uL — ABNORMAL LOW (ref 3.87–5.11)
RDW: 13 % (ref 11.5–15.5)
WBC: 10.3 10*3/uL (ref 4.0–10.5)
nRBC: 0 % (ref 0.0–0.2)

## 2020-11-11 LAB — BASIC METABOLIC PANEL
Anion gap: 9 (ref 5–15)
BUN: 16 mg/dL (ref 6–20)
CO2: 24 mmol/L (ref 22–32)
Calcium: 8.6 mg/dL — ABNORMAL LOW (ref 8.9–10.3)
Chloride: 101 mmol/L (ref 98–111)
Creatinine, Ser: 0.91 mg/dL (ref 0.44–1.00)
GFR, Estimated: >60 mL/min (ref >60)
Glucose, Bld: 282 mg/dL — ABNORMAL HIGH (ref 70–99)
Potassium: 3.9 mmol/L (ref 3.5–5.1)
Sodium: 134 mmol/L — ABNORMAL LOW (ref 135–145)

## 2020-11-11 MED ORDER — INSULIN ASPART 100 UNIT/ML ~~LOC~~ SOLN: 0.0000 [IU] | Freq: Three times a day (TID) | SUBCUTANEOUS | Status: DC

## 2020-11-11 NOTE — TOC Transition Note (Signed)
Transition of Care [facility]) - CM/SW Discharge Note   Patient Details  Name: Morgan Taylor MRN: [id number] Date of Birth: 06-Jan-1961  Transition of [facility] CM/SW Contact:  Lia Hopping, [place] Phone Number: 11/11/2020, 10:15 AM   Clinical Narrative:    Therapy Plan: OPPT @ [facility] RW ordered through [place] and delivered to the pt. bedside   Final next level of care: OP Rehab Barriers to Discharge: No Barriers Identified   Patient Goals and CMS Choice        Discharge Placement                       Discharge Plan and Services                DME Arranged: Walker rolling DME Agency: [employer] Date DME Agency Contacted: 11/11/20 Time DME Agency Contacted: [phone] Representative spoke with at DME Agency: [place] ([place]) Interventions     Readmission Risk Interventions No flowsheet data found.

## 2020-11-11 NOTE — Progress Notes (Signed)
Subjective: 1 Day Post-Op s/p Procedure(s): TOTAL HIP ARTHROPLASTY  Patient reports pain as to be well controlled overnight. Denies chest pain, SOB, Calf pain. No nausea/vomiting. No other complaints.   Objective:  PE: VITALS:   Vitals:   11/10/20 1943 11/11/20 0144 11/11/20 0144 11/11/20 0531  BP: 105/65 112/81 112/81 (!) 101/44  Pulse: 60 76 79 71  Resp: 17   17  Temp: (!) 97.5 F (36.4 C) 98.2 F (36.8 C) 98.2 F (36.8 C) 98.4 F (36.9 C)  TempSrc: Oral Oral Oral Oral  SpO2: 98% 97% 97% 97%  Weight:      Height:        ABD soft Neurovascular intact Sensation intact distally Intact pulses distally Dorsiflexion/Plantar flexion intact Incision: scant drainage    LABS  Results for orders placed or performed during the hospital encounter of 11/10/20 (from the past 24 hour(s))  Glucose, capillary     Status: Abnormal   Collection Time: 11/10/20  8:30 AM  Result Value Ref Range   Glucose-Capillary 126 (H) 70 - 99 mg/dL  Glucose, capillary     Status: Abnormal   Collection Time: 11/10/20  3:10 PM  Result Value Ref Range   Glucose-Capillary 175 (H) 70 - 99 mg/dL  Glucose, capillary     Status: Abnormal   Collection Time: 11/10/20  9:40 PM  Result Value Ref Range   Glucose-Capillary 243 (H) 70 - 99 mg/dL  CBC     Status: Abnormal   Collection Time: 11/11/20  3:38 AM  Result Value Ref Range   WBC 10.3 4.0 - 10.5 K/uL   RBC 2.99 (L) 3.87 - 5.11 MIL/uL   Hemoglobin 9.2 (L) 12.0 - 15.0 g/dL   HCT 28.6 (L) 36 - 46 %   MCV 95.7 80.0 - 100.0 fL   MCH 30.8 26.0 - 34.0 pg   MCHC 32.2 30.0 - 36.0 g/dL   RDW 13.0 11.5 - 15.5 %   Platelets 194 150 - 400 K/uL   nRBC 0.0 0.0 - 0.2 %  Basic metabolic panel     Status: Abnormal   Collection Time: 11/11/20  3:38 AM  Result Value Ref Range   Sodium 134 (L) 135 - 145 mmol/L   Potassium 3.9 3.5 - 5.1 mmol/L   Chloride 101 98 - 111 mmol/L   CO2 24 22 - 32 mmol/L   Glucose, Bld 282 (H) 70 - 99 mg/dL   BUN 16 6 - 20  mg/dL   Creatinine, Ser 0.91 0.44 - 1.00 mg/dL   Calcium 8.6 (L) 8.9 - 10.3 mg/dL   GFR, Estimated >60 >60 mL/min   Anion gap 9 5 - 15    DG Pelvis Portable  Result Date: 11/10/2020 CLINICAL DATA:  59 year old female status post total left arthroplasty. EXAM: PORTABLE PELVIS 1-2 VIEWS; DG HIP (WITH OR WITHOUT PELVIS) 1V PORT LEFT COMPARISON:  Pelvic radiograph dated 08/05/2020. FINDINGS: There is a total left hip arthroplasty. The arthroplasty components appear intact and in anatomic alignment. There is no acute fracture or dislocation. Postsurgical changes including small pockets of soft tissue air. IMPRESSION: Status post total left hip arthroplasty.  No immediate complication. Electronically Signed   By: Anner Crete M.D.   On: 11/10/2020 15:56   DG Hip Port Unilat With Pelvis 1V Left  Result Date: 11/10/2020 CLINICAL DATA:  58 year old female status post total left arthroplasty. EXAM: PORTABLE PELVIS 1-2 VIEWS; DG HIP (WITH OR WITHOUT PELVIS) 1V PORT LEFT COMPARISON:  Pelvic radiograph  dated 08/05/2020. FINDINGS: There is a total left hip arthroplasty. The arthroplasty components appear intact and in anatomic alignment. There is no acute fracture or dislocation. Postsurgical changes including small pockets of soft tissue air. IMPRESSION: Status post total left hip arthroplasty.  No immediate complication. Electronically Signed   By: Anner Crete M.D.   On: 11/10/2020 15:56    Assessment/Plan: Principal Problem:   S/P hip replacement, left   1 Day Post-Op s/p Procedure(s): TOTAL HIP ARTHROPLASTY - doing well  Weightbearing: WBAT LLE Insicional and dressing care: Reinforce dressings as needed VTE prophylaxis: Aspirin 325mg  BID x 30 days Pain control: home percocet with high dose pathway  Follow - up plan: 2 weeks with Dr. Mardelle Matte Dispo: ok to discharge today if passes PT  Contact information:   Weekdays 8-5 Merlene Pulling, PA-C [phone] A fter hours and holidays please  check [email or web address] for group call information for [facility]  Ventura Bruns 11/11/2020, 8:27 AM

## 2020-11-11 NOTE — Progress Notes (Signed)
Physical Therapy Treatment Patient Details Name: Morgan Taylor MRN: [id number] DOB: 1961/05/15 Today's Date: 11/11/2020    History of Present Illness Patient is 59 y.o. female s/p Lt THA via posterolateral approach on 11/10/20 with PMH significant for HTN, HLD, GERD, DM, depression, anxiety, OA, breast cancer, Bil TSA, Bil TKA.     PT Comments    Pt is making steady progress towards mod indep with RW. Pt ambulated 160 ft min guard assist, completed step training with RW min assist. Pt educated on posterior hip precautions and completed LE there ex with supervision. Pt will be ready for d/c home after BID treatment today. Pt will need a RW for home use. Pt will continue to benefit from continued acute skilled PT to maximize mobility and Independence for return home.   Follow Up Recommendations  Follow surgeon's recommendation for DC plan and follow-up therapies;Home health PT     Equipment Recommendations  Rolling walker with 5" wheels    Recommendations for Other Services       Precautions / Restrictions Precautions Precautions: Posterior Hip Precaution Booklet Issued: Yes (comment) Precaution Comments: pt able to recall 1/3 THP-Reviewed with patient and also issued a handout Restrictions Other Position/Activity Restrictions: WBAT    Mobility  Bed Mobility               General bed mobility comments: pt sitting in recliner  Transfers Overall transfer level: Modified independent Equipment used: Rolling walker (2 wheeled) Transfers: Sit to/from Stand Sit to Stand: Modified independent (Device/Increase time)            Ambulation/Gait Ambulation/Gait assistance: Min guard Gait Distance (Feet): 165 Feet Assistive device: Rolling walker (2 wheeled) Gait Pattern/deviations: Step-through pattern;Decreased stride length Gait velocity: decr   General Gait Details: cues for safe step pattern and proximity to [employer] Stairs: Yes Stairs assistance: Min  assist Stair Management: No rails;Backwards;With walker Number of Stairs: 3 General stair comments: handout given, pt demonstrated safe technique   Wheelchair Mobility    Modified Rankin (Stroke Patients Only)       Balance Overall balance assessment: No apparent balance deficits (not formally assessed)                                          Cognition Arousal/Alertness: Awake/alert Behavior During Therapy: WFL for tasks assessed/performed Overall Cognitive Status: Within Functional Limits for tasks assessed                                        Exercises Total Joint Exercises Ankle Circles/Pumps: AROM;Both;10 reps;Seated Quad Sets: AROM;Both;10 reps;Seated Heel Slides: AROM;Strengthening;10 reps;Seated;Left Hip ABduction/ADduction: AROM;Strengthening;Left;10 reps;Seated Long Arc Quad: AROM;Strengthening;Left;10 reps;Seated    General Comments        Pertinent Vitals/Pain Pain Assessment: 0-10 Pain Score: 6  Pain Location: Lt hip Pain Descriptors / Indicators: Discomfort Pain Intervention(s): Limited activity within patient's tolerance;Monitored during session;Ice applied    Home Living                      Prior Function            PT Goals (current goals can now be found in the care plan section) Progress towards PT goals: Progressing toward goals    Frequency  7X/week      PT Plan Current plan remains appropriate    Co-evaluation              AM-PAC PT "6 Clicks" Mobility   Outcome Measure  Help needed turning from your back to your side while in a flat bed without using bedrails?: None Help needed moving from lying on your back to sitting on the side of a flat bed without using bedrails?: None Help needed moving to and from a bed to a chair (including a wheelchair)?: A Little Help needed standing up from a chair using your arms (e.g., wheelchair or bedside chair)?: None Help needed to walk  in hospital room?: A Little Help needed climbing 3-5 steps with a railing? : A Little 6 Click Score: 21    End of Session Equipment Utilized During Treatment: Gait belt Activity Tolerance: Patient tolerated treatment well Patient left: in chair;with call bell/phone within reach Nurse Communication: Mobility status PT Visit Diagnosis: Muscle weakness (generalized) (M62.81);Difficulty in walking, not elsewhere classified (R26.2);Pain Pain - Right/Left: Left Pain - part of body: Hip     Time: 3013-1438 PT Time Calculation (min) (ACUTE ONLY): 26 min  Charges:  $Gait Training: 8-22 mins $Therapeutic Exercise: 8-22 mins                       Lelon Mast 11/11/2020, 9:12 AM

## 2020-11-11 NOTE — Discharge Summary (Signed)
Discharge Summary  Patient ID: Morgan Taylor MRN: [id number] DOB/AGE: 25-Jun-1961 59 y.o.  Admit date: 11/10/2020 Discharge date: 11/11/2020  Admission Diagnoses:  Left hip osteoarthritis  Discharge Diagnoses:  Principal Problem:   S/P hip replacement, left   Past Medical History:  Diagnosis Date  . Anxiety   . Arthritis   . Breast cancer ([place])   . Cancer [facility]) 2009   lumpectomy  . Depression    takes Remeron nightly  . Diabetes mellitus    takes Lantus and Victoza. Average fasting blood sugar runs 89  . GERD (gastroesophageal reflux disease)    takes Omeprazole daily  . Hyperlipemia    takes Lipitor daily  . Hypertension    takes Amlodipine and Losartan daily  . Hypothyroidism    takes Synthroid daily  . Neck pain   . Numbness    in both hands  . Personal history of radiation therapy   . Primary localized osteoarthrosis of right shoulder 03/21/2017    Surgeries: Procedure(s): TOTAL HIP ARTHROPLASTY on 11/10/2020   Consultants (if any):   Discharged Condition: Improved  Hospital Course: Morgan Taylor is an 59 y.o. female who was admitted 11/10/2020 with a diagnosis of left hip osteoarthritis and went to the operating room on 11/10/2020 and underwent the above named procedures.    She was given perioperative antibiotics:  Anti-infectives (From admission, onward)   Start     Dose/Rate Route Frequency Ordered Stop   11/10/20 2000  valACYclovir (VALTREX) tablet 500 mg        500 mg Oral Daily 11/10/20 1609     11/10/20 1730  ceFAZolin (ANCEF) IVPB 2g/100 mL premix        2 g 200 mL/hr over 30 Minutes Intravenous Every 6 hours 11/10/20 1609 11/10/20 2339   11/10/20 0830  ceFAZolin (ANCEF) IVPB 2g/100 mL premix        2 g 200 mL/hr over 30 Minutes Intravenous On call to O.R. 11/10/20 [id number] 11/10/20 1134    .  She was given sequential compression devices, early ambulation, and aspirin for DVT prophylaxis.  She benefited maximally from the hospital stay and there  were no complications.    Recent vital signs:  Vitals:   11/11/20 0144 11/11/20 0531  BP: 112/81 (!) 101/44  Pulse: 79 71  Resp:  17  Temp: 98.2 F (36.8 C) 98.4 F (36.9 C)  SpO2: 97% 97%    Recent laboratory studies:  Lab Results  Component Value Date   HGB 9.2 (L) 11/11/2020   HGB 12.5 11/02/2020   HGB 13.9 02/13/2018   Lab Results  Component Value Date   WBC 10.3 11/11/2020   PLT 194 11/11/2020   Lab Results  Component Value Date   INR 1.07 01/09/2015   Lab Results  Component Value Date   NA 134 (L) 11/11/2020   K 3.9 11/11/2020   CL 101 11/11/2020   CO2 24 11/11/2020   BUN 16 11/11/2020   CREATININE 0.91 11/11/2020   GLUCOSE 282 (H) 11/11/2020    Discharge Medications:   Allergies as of 11/11/2020      Reactions   Lyrica [pregabalin] Swelling   Hands, legs, feet   Sulfonamide Derivatives Hives   Vortioxetine Nausea And Vomiting      Medication List    STOP taking these medications   dicyclomine 20 MG tablet Commonly known as: BENTYL   GOODY HEADACHE PO   HM MULTIVITAMIN ADULT GUMMY PO   meloxicam 15 MG tablet Commonly  known as: MOBIC   mirtazapine 30 MG tablet Commonly known as: REMERON   ondansetron 4 MG disintegrating tablet Commonly known as: Zofran ODT   oxyCODONE 10 mg 12 hr tablet Commonly known as: OXYCONTIN Replaced by: oxyCODONE 5 MG immediate release tablet     TAKE these medications   amLODipine 10 MG tablet Commonly known as: NORVASC Take 10 mg by mouth daily.   aspirin EC 325 MG tablet Take 1 tablet (325 mg total) by mouth 2 (two) times daily.   atorvastatin 20 MG tablet Commonly known as: LIPITOR Take 20 mg by mouth daily.   baclofen 10 MG tablet Commonly known as: LIORESAL Take 1 tablet (10 mg total) by mouth 3 (three) times daily. As needed for muscle spasm What changed: Another medication with the same name was removed. Continue taking this medication, and follow the directions you see here.   Biotin  10000 MCG Tabs Take 10,000 mcg by mouth at bedtime.   HYDROmorphone 2 MG tablet Commonly known as: Dilaudid Take 1 tablet (2 mg total) by mouth every 6 (six) hours as needed for severe pain (breakthrough pain).   insulin glargine 100 UNIT/ML injection Commonly known as: LANTUS Inject 34 Units into the skin at bedtime.   levothyroxine 75 MCG tablet Commonly known as: SYNTHROID Take 75 mcg by mouth daily before breakfast.   losartan 100 MG tablet Commonly known as: COZAAR Take 100 mg by mouth daily.   omeprazole 40 MG capsule Commonly known as: PRILOSEC Take 40 mg by mouth daily.   ondansetron 4 MG tablet Commonly known as: Zofran Take 1 tablet (4 mg total) by mouth every 8 (eight) hours as needed for nausea or vomiting.   oxyCODONE 5 MG immediate release tablet Commonly known as: Roxicodone Take 1 tablet (5 mg total) by mouth every 6 (six) hours as needed for moderate pain or severe pain. Replaces: oxyCODONE 10 mg 12 hr tablet   oxyCODONE-acetaminophen 5-325 MG tablet Commonly known as: PERCOCET/ROXICET Take 1 tablet by mouth every 6 (six) hours as needed for moderate pain or severe pain. What changed: Another medication with the same name was removed. Continue taking this medication, and follow the directions you see here.   pioglitazone 15 MG tablet Commonly known as: ACTOS Take 15 mg by mouth daily.   sennosides-docusate sodium 8.6-50 MG tablet Commonly known as: SENOKOT-S Take 2 tablets by mouth daily.   valACYclovir 500 MG tablet Commonly known as: VALTREX Take 500 mg by mouth daily.   Victoza 18 MG/3ML Sopn Generic drug: liraglutide Inject 1.8 mg into the skin daily.   Vitamin D 125 MCG (5000 UT) Caps Take 5,000 Units by mouth daily.       Diagnostic Studies: MR CERVICAL SPINE WO CONTRAST  Result Date: 11/08/2020 CLINICAL DATA:  Neck pain. EXAM: MRI CERVICAL SPINE WITHOUT CONTRAST TECHNIQUE: Multiplanar, multisequence MR imaging of the cervical spine  was performed. No intravenous contrast was administered. COMPARISON:  MRI of the cervical spine May 09, 2017. FINDINGS: Alignment: Physiologic. Vertebrae: No fracture, evidence of discitis, or bone lesion. Cord: Normal signal and morphology. Posterior Fossa, vertebral arteries, paraspinal tissues: Negative. Disc levels: C2-3: No spinal canal or neural foraminal stenosis. C3-4: No spinal canal stenosis. Uncovertebral and facet degenerative changes resulting in mild bilateral neural foraminal narrowing. C4-5: Posterior disc protrusion resulting mild spinal canal stenosis. Uncovertebral and facet degenerative changes resulting mild bilateral neural foraminal narrowing. C5-6: Posterior disc protrusion resulting in mild spinal stenosis. Facet degenerative changes resulting moderate bilateral neural  foraminal narrowing, progressed from prior MRI. C6-7: Posterior disc protrusion without significant canal stenosis. Uncovertebral and facet degenerative changes resulting in mild left neural foraminal narrowing. C7-T1: No spinal canal or neural foraminal stenosis. IMPRESSION: Mild multilevel degenerative changes of the cervical spine as described above, worst at C5-6 where there is mild spinal canal stenosis and moderate bilateral neural foraminal narrowing, progressed from prior MRI. Electronically Signed   By: Pedro Earls M.D.   On: 11/08/2020 19:06   DG Pelvis Portable  Result Date: 11/10/2020 CLINICAL DATA:  59 year old female status post total left arthroplasty. EXAM: PORTABLE PELVIS 1-2 VIEWS; DG HIP (WITH OR WITHOUT PELVIS) 1V PORT LEFT COMPARISON:  Pelvic radiograph dated 08/05/2020. FINDINGS: There is a total left hip arthroplasty. The arthroplasty components appear intact and in anatomic alignment. There is no acute fracture or dislocation. Postsurgical changes including small pockets of soft tissue air. IMPRESSION: Status post total left hip arthroplasty.  No immediate complication.  Electronically Signed   By: Anner Crete M.D.   On: 11/10/2020 15:56   DG Hip Port Unilat With Pelvis 1V Left  Result Date: 11/10/2020 CLINICAL DATA:  59 year old female status post total left arthroplasty. EXAM: PORTABLE PELVIS 1-2 VIEWS; DG HIP (WITH OR WITHOUT PELVIS) 1V PORT LEFT COMPARISON:  Pelvic radiograph dated 08/05/2020. FINDINGS: There is a total left hip arthroplasty. The arthroplasty components appear intact and in anatomic alignment. There is no acute fracture or dislocation. Postsurgical changes including small pockets of soft tissue air. IMPRESSION: Status post total left hip arthroplasty.  No immediate complication. Electronically Signed   By: Anner Crete M.D.   On: 11/10/2020 15:56    Disposition: Discharge disposition: 01-Home or Self Care          Follow-up Information    Marchia Bond, MD. Schedule an appointment as soon as possible for a visit in 2 weeks.   Specialty: Orthopedic Surgery Contact information: [place] [address] [phone]                Signed: Jola Baptist 11/11/2020, 8:29 AM

## 2020-11-12 ENCOUNTER — Ambulatory Visit: Payer: BC Managed Care – PPO | Attending: Orthopaedic Surgery | Admitting: Physical Therapy

## 2020-11-12 ENCOUNTER — Other Ambulatory Visit: Payer: Self-pay

## 2020-11-12 ENCOUNTER — Encounter: Payer: Self-pay | Admitting: Physical Therapy

## 2020-11-12 DIAGNOSIS — M6281 Muscle weakness (generalized): Secondary | ICD-10-CM | POA: Diagnosis present

## 2020-11-12 DIAGNOSIS — R2689 Other abnormalities of gait and mobility: Secondary | ICD-10-CM | POA: Insufficient documentation

## 2020-11-12 DIAGNOSIS — M25652 Stiffness of left hip, not elsewhere classified: Secondary | ICD-10-CM | POA: Insufficient documentation

## 2020-11-12 DIAGNOSIS — M25552 Pain in left hip: Secondary | ICD-10-CM | POA: Diagnosis present

## 2020-11-12 NOTE — Therapy (Signed)
[place], [place], [address] Phone: [phone]   Fax:  [phone]  Physical Therapy Evaluation  Patient Details  Name: Morgan Taylor MRN: [id number] Date of Birth: 01-14-1961 Referring Provider (PT): Marchia Bond, MD   Encounter Date: 11/12/2020   PT End of Session - 11/12/20 1308    Visit Number 1    Number of Visits 17    Date for PT Re-Evaluation 01/07/21    Authorization Type UHC MCR    Authorization Time Period FOTO by 6th visit    Progress Note Due on Visit 10    PT Start Time 1215    PT Stop Time 1300    PT Time Calculation (min) 45 min    Activity Tolerance Patient tolerated treatment well    Behavior During Therapy [facility] for tasks assessed/performed           Past Medical History:  Diagnosis Date  . Anxiety   . Arthritis   . Breast cancer ([place])   . Cancer [facility]) 2009   lumpectomy  . Depression    takes Remeron nightly  . Diabetes mellitus    takes Lantus and Victoza. Average fasting blood sugar runs 89  . GERD (gastroesophageal reflux disease)    takes Omeprazole daily  . Hyperlipemia    takes Lipitor daily  . Hypertension    takes Amlodipine and Losartan daily  . Hypothyroidism    takes Synthroid daily  . Neck pain   . Numbness    in both hands  . Personal history of radiation therapy   . Primary localized osteoarthrosis of right shoulder 03/21/2017    Past Surgical History:  Procedure Laterality Date  . APPENDECTOMY    . BREAST BIOPSY    . BREAST LUMPECTOMY Left 2009  . CESAREAN SECTION    . COLONOSCOPY    . DILATION AND CURETTAGE OF UTERUS    . SHOULDER ARTHROSCOPY Left    cleaned out joint  . TOTAL KNEE ARTHROPLASTY Right 08/15/2014   Procedure: RIGHT TOTAL KNEE ARTHROPLASTY;  Surgeon: Yvette Rack., MD;  Location: [place];  Service: Orthopedics;  Laterality: Right;  . TOTAL KNEE ARTHROPLASTY Left 01/23/2015   Procedure: TOTAL KNEE ARTHROPLASTY;  Surgeon: Yvette Rack., MD;  Location: [place];  Service: Orthopedics;  Laterality: Left;  . TOTAL SHOULDER ARTHROPLASTY Left 02/19/2016   Procedure: LEFT TOTAL SHOULDER ARTHROPLASTY;  Surgeon: Marchia Bond, MD;  Location: [place];  Service: Orthopedics;  Laterality: Left;  . TOTAL SHOULDER ARTHROPLASTY Right 03/21/2017   Procedure: TOTAL SHOULDER ARTHROPLASTY;  Surgeon: Marchia Bond, MD;  Location: [place];  Service: Orthopedics;  Laterality: Right;    There were no vitals filed for this visit.    Subjective Assessment - 11/12/20 1221    Subjective Patient reports she had a left THA with posterior approach 2 days ago. She just got out of the hospital yesterday. She is having pretty constant dull pain and has sharp pains when she stands, walks, or moves the hip. She requires assistance from her children for standing from a chair, getting dressed, bathing, and going to lay down in bed.    Pertinent History Bilat TSA and TKA, chronic pain in pain management, diabetic, depression, breast CA s/p lumpectomy in 2009    Limitations Sitting;Lifting;Standing;Walking;House hold activities    How long can you sit comfortably? 20-30 minutes    How long can you stand comfortably? All standing uncomfortable    How  long can you walk comfortably? All walking uncomfortable    Patient Stated Goals Get back to walking without device, lighter duty job    Currently in Pain? Yes    Pain Score 8     Pain Location Hip    Pain Orientation Left    Pain Descriptors / Indicators Aching;Sharp;Dull    Pain Type Surgical pain    Pain Onset More than a month ago    Pain Frequency Constant    Aggravating Factors  Sitting, standing, walking    Pain Relieving Factors Rest, ice, medication    Effect of Pain on Daily Activities Patient limited with all activity              [facility] PT Assessment - 11/12/20 0001      Assessment   Medical Diagnosis Post-op left total hip replacement - posterior approach    Referring Provider (PT)  Marchia Bond, MD    Onset Date/Surgical Date 11/10/20    Hand Dominance Right    Next MD Visit 11/25/2020    Prior Therapy Yes - neck      Precautions   Precautions Posterior Hip      Restrictions   Weight Bearing Restrictions No      Balance Screen   Has the patient fallen in the past 6 months No    Has the patient had a decrease in activity level because of a fear of falling?  No    Is the patient reluctant to leave their home because of a fear of falling?  No      Home Social worker Private residence    Living Arrangements Children    Available Help at Discharge Family    Type of [place] to enter    Entrance Stairs-Number of Steps [place] One level    [place] - 2 wheels;Shower seat;Grab bars - toilet;Hand held shower head      Prior Function   Level of Independence Independent with household mobility with device;Independent with community mobility with device;Needs assistance with homemaking;Needs assistance with transfers    Vocation On disability   short term until February     Cognition   Overall Cognitive Status Within Functional Limits for tasks assessed      Observation/Other Assessments   Observations Patient appears in no apparent distress    Focus on Therapeutic Outcomes (FOTO)  Not assessed this visit - will assess next visit      Observation/Other Assessments-Edema    Edema --   generalized LLE edema     Sensation   Light Touch Appears Intact      Coordination   Gross Motor Movements are Fluid and Coordinated Yes      Posture/Postural Control   Posture Comments Patient exhibits rounded and shrugged shoulder posture      ROM / Strength   AROM / PROM / Strength PROM;Strength      PROM   Overall PROM Comments Hip PROM achieves allowable ranges, knee flexion limitation bilaterally, left knee extension limitation      Strength   Strength Assessment  Site Hip;Knee    Right/Left Hip Left;Right    Right Hip Flexion 4/5    Right Hip Extension 3+/5    Right Hip ABduction 3+/5    Left Hip Flexion 3-/5    Left Hip Extension 3-/5    Left Hip ABduction  3-/5    Right/Left Knee Right;Left    Right Knee Flexion 4+/5    Right Knee Extension 4+/5    Left Knee Flexion 4/5    Left Knee Extension 4/5      Palpation   Palpation comment Generalized left hip post-op tenderness      Transfers   Transfers Sit to Stand    Sit to Stand 4: Min assist    Comments Manual assist to stand from standard chair      Ambulation/Gait   Ambulation/Gait Yes    Ambulation/Gait Assistance 6: Modified independent (Device/Increase time)    Assistive device Rolling walker    Gait Comments Antalgic on left with increased BUE support                      Objective measurements completed on examination: See above findings.       [facility] Adult PT Treatment/Exercise - 11/12/20 0001      Exercises   Exercises Knee/Hip      Knee/Hip Exercises: Standing   Gait Training Proper heel-toe progression, posture and avoid shrugging shoulders, keeping within rolling walker    Other Standing Knee Exercises Lateral weight shifts 10 x 3 sec hold      Knee/Hip Exercises: Seated   Long Arc Quad 10 reps    Other Seated Knee/Hip Exercises Hee-toe raises x 10      Knee/Hip Exercises: Supine   Short Arc Quad Sets 10 reps    Heel Slides 10 reps    Bridges Limitations Hookling glute sets 10 x 5 sec    Other Supine Knee/Hip Exercises Hip abduction x 10    Other Supine Knee/Hip Exercises Marching x 10                  PT Education - 11/12/20 1303    Education Details Exam findings, POC, HEP, surgical precautions    Person(s) Educated Patient    Methods Explanation;Demonstration;Tactile cues;Verbal cues;Handout    Comprehension Verbalized understanding;Returned demonstration;Verbal cues required;Tactile cues required;Need further instruction              PT Short Term Goals - 11/12/20 1317      PT SHORT TERM GOAL #1   Title Patient will be I with initial HEP to progress with PT    Time 4    Period Weeks    Status New    Target Date 12/10/20      PT SHORT TERM GOAL #2   Title Patient will be able to perform sit<>stand and bed mobility without assist to improve independence    Time 4    Period Weeks    Status New    Target Date 12/10/20      PT SHORT TERM GOAL #3   Title Patient will be able to ambulate household distances with LRAD without limitation    Time 4    Period Weeks    Status New    Target Date 12/10/20      PT SHORT TERM GOAL #4   Title PT will take FOTO assessment on 2nd visit and review with patient by 3rd visit    Time 3    Period Weeks    Status New    Target Date 12/03/20             PT Long Term Goals - 11/12/20 1319      PT LONG TERM GOAL #1   Title Patient will be I with final  HEP to maintain progress from PT    Time 8    Period Weeks    Status New    Target Date 01/07/21      PT LONG TERM GOAL #2   Title Patient will be able to ambulate community level distances with LRAD to improve shopping ability    Time 8    Period Weeks    Status New    Target Date 01/07/21      PT LONG TERM GOAL #3   Title Patient will exhibit left hip strength grossly >/= 4/5 MMT to negoitate stairs without limitation    Time 8    Period Weeks    Status New    Target Date 01/07/21      PT LONG TERM GOAL #4   Title Patient will demonstrate left hip motion grossly WFL to improve dressing ability    Time 8    Period Weeks    Status New    Target Date 01/07/21      PT LONG TERM GOAL #5   Title Establish FOTO goal at next visit    Time 8    Period Weeks    Status New    Target Date 01/07/21                  Plan - 11/12/20 1309    Clinical Impression Statement Patient presents to PT following left THA using posterior approach on 11/10/2020. Shee currently exhibits increased left hip pain  and impairments with hip motion, strength, limitations with transfers and gait using rolling walking. No signs or symptoms of infection. She was provided exercises to initiate hip strengthening allowed precautions, and improving weight acceptance on LLE to progress gait. Patient would benefit from continued skilled PT to progress her strength and mobility in order to return to walking without limitation and maximize functional ability.    Personal Factors and Comorbidities Fitness;Past/Current Experience;Social Background;Time since onset of injury/illness/exacerbation;Comorbidity 3+    Comorbidities diabetic, history of breast CA, OA with history bilateral TKA and TSA, depression, chronic pain    Examination-Activity Limitations Locomotion Level;Bathing;Bed Mobility;Dressing;Stand;Stairs;Squat;Sit;Transfers    Examination-Participation Restrictions Meal Prep;Occupation;Cleaning;Community Activity;Driving;Shop;Laundry    Stability/Clinical Decision Making Evolving/Moderate complexity    Clinical Decision Making Moderate    Rehab Potential Good    PT Frequency 2x / week    PT Duration 8 weeks    PT Treatment/Interventions ADLs/Self Care Home Management;Iontophoresis 4mg /ml Dexamethasone;Moist Heat;Traction;Ultrasound;Cryotherapy;Electrical Stimulation;Therapeutic exercise;Patient/family education;Manual techniques;Dry needling;Therapeutic activities;Neuromuscular re-education;Taping;Aquatic Therapy;Balance training;Functional mobility training;Stair training;Gait training;Passive range of motion;Joint Manipulations    PT Next Visit Plan Review HEP and progress PRN, manual and stretching for hip motion and pain within allowable range, gait training with LRAD, progress strengthening as tolerated    PT Home Exercise Plan [email or web address]: supine heel slide, hookling glute set, SAQ, marching, supine hip abduction, LAQ, seated heel-toe raises, standing weight shifts    Consulted and Agree with Plan of Care Patient            Patient will benefit from skilled therapeutic intervention in order to improve the following deficits and impairments:  Abnormal gait, Decreased range of motion, Difficulty walking, Decreased activity tolerance, Pain, Decreased balance, Decreased strength, Postural dysfunction  Visit Diagnosis: Pain in left hip  Stiffness of left hip, not elsewhere classified  Muscle weakness (generalized)  Other abnormalities of gait and mobility     Problem List Patient Active Problem List   Diagnosis Date Noted  . S/P hip replacement, left 11/10/2020  .  Primary localized osteoarthrosis of right shoulder 03/21/2017  . S/P shoulder replacement 02/19/2016  . Genetic testing 12/11/2015  . primary Osteoarthritis of left knee 01/23/2015  . Osteoarthritis of right knee 08/15/2014  . Primary cancer of lower outer quadrant of left female breast ([place]) 01/23/2012  . HYPERGLYCEMIA 05/03/2007  . HYPERPLASIA, ENDOMETRIAL NOS 02/22/2007  . DYSMENORRHEA 10/18/2006  . DEGENERATIVE JOINT DISEASE 10/18/2006  . INSOMNIA 10/18/2006  . CHEST PAIN 10/18/2006  . OBESITY 10/05/2006  . ANXIETY 10/05/2006  . HYPERTENSION 10/05/2006  . GASTROENTERITIS 10/05/2006  . MENORRHAGIA, PERIMENOPAUSAL 10/05/2006    Hilda Blades, PT, DPT, LAT, ATC 11/12/20  1:41 PM Phone: [phone] Fax: [facility] [address] [place], [place], [address] Phone: [phone]   Fax:  [phone]  Name: Morgan Taylor MRN: [id number] Date of Birth: Jan 29, 1961

## 2020-11-12 NOTE — Patient Instructions (Signed)
Access Code: [email or web address] URL: [email or web address] Date: 11/12/2020 Prepared by: Hilda Blades  Exercises Supine Heel Slide - 3-4 x daily - 7 x weekly - 10 reps Hooklying Gluteal Sets - 3-4 x daily - 7 x weekly - 10 reps - 5 seconds hold Supine Short Arc Quad - 3-4 x daily - 7 x weekly - 10 reps Supine March - 3-4 x daily - 7 x weekly - 10 reps Supine Hip Abduction - 3-4 x daily - 7 x weekly - 10 reps Seated Long Arc Quad - 3-4 x daily - 7 x weekly - 10 reps Seated Heel Toe Raises - 3-4 x daily - 7 x weekly - 10 reps Side to Side Weight Shift with Counter Support - 3-4 x daily - 7 x weekly - 10 reps - 3 seconds hold

## 2020-11-13 ENCOUNTER — Ambulatory Visit: Payer: Medicare Other

## 2020-11-15 NOTE — Anesthesia Postprocedure Evaluation (Signed)
Anesthesia Post Note  Patient: Morgan Taylor  Procedure(s) Performed: TOTAL HIP ARTHROPLASTY (Left Hip)     Patient location during evaluation: PACU Anesthesia Type: Combined General/Spinal Level of consciousness: awake and alert Pain management: pain level controlled Vital Signs Assessment: post-procedure vital signs reviewed and stable Respiratory status: spontaneous breathing, nonlabored ventilation, respiratory function stable and patient connected to nasal cannula oxygen Cardiovascular status: blood pressure returned to baseline and stable Postop Assessment: no apparent nausea or vomiting Anesthetic complications: no   No complications documented.  Last Vitals:  Vitals:   11/11/20 0915 11/11/20 0922  BP: (!) 100/47 106/76  Pulse: 62 65  Resp: 18   Temp: 37 C   SpO2: 99%     Last Pain:  Vitals:   11/11/20 1028  TempSrc:   PainSc: 2                  Demetress Tift

## 2020-11-16 ENCOUNTER — Telehealth: Payer: Self-pay | Admitting: Physical Therapy

## 2020-11-16 ENCOUNTER — Other Ambulatory Visit: Payer: Self-pay

## 2020-11-16 ENCOUNTER — Ambulatory Visit
Admission: RE | Admit: 2020-11-16 | Discharge: 2020-11-16 | Disposition: A | Discharge status: Home / Self Care | Payer: Medicare Other | Source: Ambulatory Visit | Attending: Internal Medicine | Admitting: Internal Medicine

## 2020-11-16 ENCOUNTER — Ambulatory Visit: Payer: BC Managed Care – PPO | Admitting: Physical Therapy

## 2020-11-16 DIAGNOSIS — Z1231 Encounter for screening mammogram for malignant neoplasm of breast: Secondary | ICD-10-CM

## 2020-11-16 NOTE — Telephone Encounter (Signed)
Attempted to contact patient due to missed PT appointment. Left VM informing patient of missed appointment and attendance policy. Patient reminded of next scheduled appointment and to call of she needed to cancel or reschedule.  Hilda Blades, PT, DPT, LAT, ATC 11/16/20  3:06 PM Phone: [phone] Fax: [phone]

## 2020-11-18 ENCOUNTER — Other Ambulatory Visit: Payer: Self-pay

## 2020-11-18 ENCOUNTER — Encounter: Payer: Self-pay | Admitting: Physical Therapy

## 2020-11-18 ENCOUNTER — Ambulatory Visit: Payer: BC Managed Care – PPO | Admitting: Physical Therapy

## 2020-11-18 DIAGNOSIS — M25652 Stiffness of left hip, not elsewhere classified: Secondary | ICD-10-CM

## 2020-11-18 DIAGNOSIS — M25552 Pain in left hip: Secondary | ICD-10-CM | POA: Diagnosis not present

## 2020-11-18 DIAGNOSIS — M6281 Muscle weakness (generalized): Secondary | ICD-10-CM

## 2020-11-18 DIAGNOSIS — R2689 Other abnormalities of gait and mobility: Secondary | ICD-10-CM

## 2020-11-18 NOTE — Therapy (Signed)
[place], [place], [address] Phone: [phone]   Fax:  [phone]  Physical Therapy Treatment  Patient Details  Name: Morgan Taylor MRN: [id number] Date of Birth: 1961-12-07 Referring Provider (PT): Marchia Bond, MD   Encounter Date: 11/18/2020   PT End of Session - 11/18/20 1207    Visit Number 2    Number of Visits 17    Date for PT Re-Evaluation 01/07/21    Authorization Type UHC MCR    Authorization Time Period FOTO by 6th visit    Progress Note Due on Visit 10    PT Start Time 1158    PT Stop Time 1240    PT Time Calculation (min) 42 min    Activity Tolerance Patient tolerated treatment well    Behavior During Therapy [facility] for tasks assessed/performed           Past Medical History:  Diagnosis Date   Anxiety    Arthritis    Breast cancer ([place])    Cancer ([place]) 2009   lumpectomy   Depression    takes Remeron nightly   Diabetes mellitus    takes Lantus and Victoza. Average fasting blood sugar runs 89   GERD (gastroesophageal reflux disease)    takes Omeprazole daily   Hyperlipemia    takes Lipitor daily   Hypertension    takes Amlodipine and Losartan daily   Hypothyroidism    takes Synthroid daily   Neck pain    Numbness    in both hands   Personal history of radiation therapy    Primary localized osteoarthrosis of right shoulder 03/21/2017    Past Surgical History:  Procedure Laterality Date   APPENDECTOMY     BREAST BIOPSY     BREAST LUMPECTOMY Left 2009   CESAREAN SECTION     COLONOSCOPY     DILATION AND CURETTAGE OF UTERUS     SHOULDER ARTHROSCOPY Left    cleaned out joint   TOTAL HIP ARTHROPLASTY Left 11/10/2020   Procedure: TOTAL HIP ARTHROPLASTY;  Surgeon: Marchia Bond, MD;  Location: [facility];  Service: Orthopedics;  Laterality: Left;   TOTAL KNEE ARTHROPLASTY Right 08/15/2014   Procedure: RIGHT TOTAL KNEE ARTHROPLASTY;  Surgeon: Yvette Rack., MD;  Location: [place];  Service: Orthopedics;  Laterality: Right;   TOTAL KNEE ARTHROPLASTY Left 01/23/2015   Procedure: TOTAL KNEE ARTHROPLASTY;  Surgeon: Yvette Rack., MD;  Location: [place];  Service: Orthopedics;  Laterality: Left;   TOTAL SHOULDER ARTHROPLASTY Left 02/19/2016   Procedure: LEFT TOTAL SHOULDER ARTHROPLASTY;  Surgeon: Marchia Bond, MD;  Location: [place];  Service: Orthopedics;  Laterality: Left;   TOTAL SHOULDER ARTHROPLASTY Right 03/21/2017   Procedure: TOTAL SHOULDER ARTHROPLASTY;  Surgeon: Marchia Bond, MD;  Location: [place];  Service: Orthopedics;  Laterality: Right;    There were no vitals filed for this visit.   Subjective Assessment - 11/18/20 1202    Subjective Patient reports she is still having some left hip pain, she isn't taking her pain medication as much because it makes her sick.    Patient Stated Goals Get back to walking without device, lighter duty job    Currently in Pain? Yes    Pain Score 7     Pain Location Hip    Pain Orientation Left    Pain Descriptors / Indicators Aching;Dull;Sharp    Pain Type Surgical pain    Pain Onset More than a month ago  Pain Frequency Constant                             [facility] Adult PT Treatment/Exercise - 11/18/20 0001      Exercises   Exercises Knee/Hip      Knee/Hip Exercises: Aerobic   Nustep L4 x 5 min with LE only      Knee/Hip Exercises: Standing   Heel Raises 2 sets;10 reps    Heel Raises Limitations BUE support on walker    Hip Flexion 2 sets;10 reps    Hip Flexion Limitations BUE support on walker    Hip Abduction 2 sets;5 reps    Abduction Limitations BUE support on walker      Knee/Hip Exercises: Seated   Long Arc Quad 2 sets;15 reps    Long Arc Quad Weight 2 lbs.    Hamstring Curl 2 sets;15 reps    Hamstring Limitations yellow band    Sit to Sand 2 sets;5 reps      Knee/Hip Exercises: Supine   Bridges Limitations Hookling glute sets with ball squeeze 10  x 5 sec    Bridges with [employer] 2 sets;5 reps    Straight Leg Raises 5 reps    Other Supine Knee/Hip Exercises Clamshell 2 x 15 with yellow    Other Supine Knee/Hip Exercises Marching 2 x 10                  PT Education - 11/18/20 1203    Education Details HEP    Person(s) Educated Patient    Methods Explanation;Demonstration;Verbal cues    Comprehension Verbalized understanding;Returned demonstration;Verbal cues required;Need further instruction            PT Short Term Goals - 11/12/20 1317      PT SHORT TERM GOAL #1   Title Patient will be I with initial HEP to progress with PT    Time 4    Period Weeks    Status New    Target Date 12/10/20      PT SHORT TERM GOAL #2   Title Patient will be able to perform sit<>stand and bed mobility without assist to improve independence    Time 4    Period Weeks    Status New    Target Date 12/10/20      PT SHORT TERM GOAL #3   Title Patient will be able to ambulate household distances with LRAD without limitation    Time 4    Period Weeks    Status New    Target Date 12/10/20      PT SHORT TERM GOAL #4   Title PT will take FOTO assessment on 2nd visit and review with patient by 3rd visit    Time 3    Period Weeks    Status New    Target Date 12/03/20             PT Long Term Goals - 11/12/20 1319      PT LONG TERM GOAL #1   Title Patient will be I with final HEP to maintain progress from PT    Time 8    Period Weeks    Status New    Target Date 01/07/21      PT LONG TERM GOAL #2   Title Patient will be able to ambulate community level distances with LRAD to improve shopping ability    Time 8    Period  Weeks    Status New    Target Date 01/07/21      PT LONG TERM GOAL #3   Title Patient will exhibit left hip strength grossly >/= 4/5 MMT to negoitate stairs without limitation    Time 8    Period Weeks    Status New    Target Date 01/07/21      PT LONG TERM GOAL #4   Title Patient will  demonstrate left hip motion grossly WFL to improve dressing ability    Time 8    Period Weeks    Status New    Target Date 01/07/21      PT LONG TERM GOAL #5   Title Establish FOTO goal at next visit    Time 8    Period Weeks    Status New    Target Date 01/07/21                 Plan - 11/18/20 1208    Clinical Impression Statement Patient tolerated therapy well with no advers effects. Therapy focused on progressing hip strength and incorporating more standing exercises to improve hip control and weight bearing tolerance. She did report soreness and discomfort with exercise but this resolved with rest. No change to current HEP. Patient would benefit from continued skilled PT to progress her strength and mobility in order to return to walking without limitation and maximize functional ability.    PT Treatment/Interventions ADLs/Self Care Home Management;Iontophoresis 4mg /ml Dexamethasone;Moist Heat;Traction;Ultrasound;Cryotherapy;Electrical Stimulation;Therapeutic exercise;Patient/family education;Manual techniques;Dry needling;Therapeutic activities;Neuromuscular re-education;Taping;Aquatic Therapy;Balance training;Functional mobility training;Stair training;Gait training;Passive range of motion;Joint Manipulations    PT Next Visit Plan Review HEP and progress PRN, manual and/or stretching for hip motion and pain reduction within allowable range, gait training progressing to cane or no AD, progress strengthening as tolerated    PT Home Exercise Plan [email or web address]: supine heel slide, hookling glute set, SAQ, marching, supine hip abduction, LAQ, seated heel-toe raises, standing weight shifts    Consulted and Agree with Plan of Care Patient           Patient will benefit from skilled therapeutic intervention in order to improve the following deficits and impairments:  Abnormal gait, Decreased range of motion, Difficulty walking, Decreased activity tolerance, Pain, Decreased balance,  Decreased strength, Postural dysfunction  Visit Diagnosis: Pain in left hip  Stiffness of left hip, not elsewhere classified  Muscle weakness (generalized)  Other abnormalities of gait and mobility     Problem List Patient Active Problem List   Diagnosis Date Noted   S/P hip replacement, left 11/10/2020   Primary localized osteoarthrosis of right shoulder 03/21/2017   S/P shoulder replacement 02/19/2016   Genetic testing 12/11/2015   primary Osteoarthritis of left knee 01/23/2015   Osteoarthritis of right knee 08/15/2014   Primary cancer of lower outer quadrant of left female breast ([place]) 01/23/2012   HYPERGLYCEMIA 05/03/2007   HYPERPLASIA, ENDOMETRIAL NOS 02/22/2007   DYSMENORRHEA 10/18/2006   DEGENERATIVE JOINT DISEASE 10/18/2006   INSOMNIA 10/18/2006   CHEST PAIN 10/18/2006   OBESITY 10/05/2006   ANXIETY 10/05/2006   HYPERTENSION 10/05/2006   GASTROENTERITIS 10/05/2006   MENORRHAGIA, PERIMENOPAUSAL 10/05/2006    Hilda Blades, PT, DPT, LAT, ATC 11/18/20  12:40 PM Phone: [phone] Fax: [facility] [address] [place], [place], [address] Phone: [phone]   Fax:  [phone]  Name: Morgan Taylor MRN: [id number] Date of Birth: 08-14-1961

## 2020-11-18 NOTE — Patient Instructions (Signed)
Access Code: [email or web address] URL: [email or web address] Date: 11/18/2020 Prepared by: Hilda Blades  Exercises Supine Heel Slide - 3-4 x daily - 7 x weekly - 10 reps Hooklying Gluteal Sets - 3-4 x daily - 7 x weekly - 10 reps - 5 seconds hold Supine March - 3-4 x daily - 7 x weekly - 10 reps Supine Hip Abduction - 3-4 x daily - 7 x weekly - 10 reps Seated Long Arc Quad - 3-4 x daily - 7 x weekly - 10 reps Seated Heel Toe Raises - 3-4 x daily - 7 x weekly - 10 reps Side to Side Weight Shift with Counter Support - 3-4 x daily - 7 x weekly - 10 reps - 3 seconds hold

## 2020-11-25 ENCOUNTER — Encounter: Payer: Self-pay | Admitting: Physical Therapy

## 2020-11-25 ENCOUNTER — Other Ambulatory Visit: Payer: Self-pay

## 2020-11-25 ENCOUNTER — Ambulatory Visit: Payer: BC Managed Care – PPO | Attending: Orthopedic Surgery | Admitting: Physical Therapy

## 2020-11-25 DIAGNOSIS — M25652 Stiffness of left hip, not elsewhere classified: Secondary | ICD-10-CM | POA: Insufficient documentation

## 2020-11-25 DIAGNOSIS — R2689 Other abnormalities of gait and mobility: Secondary | ICD-10-CM | POA: Insufficient documentation

## 2020-11-25 DIAGNOSIS — M25552 Pain in left hip: Secondary | ICD-10-CM | POA: Insufficient documentation

## 2020-11-25 DIAGNOSIS — M6281 Muscle weakness (generalized): Secondary | ICD-10-CM | POA: Diagnosis present

## 2020-11-25 NOTE — Patient Instructions (Signed)
Access Code: [email or web address] URL: [email or web address] Date: 11/25/2020 Prepared by: Hilda Blades  Exercises Supine Heel Slide - 1-2 x daily - 7 x weekly - 10 reps - 2 sets Supine March - 1-2 x daily - 7 x weekly - 10 reps - 2 sets Seated Long Arc Quad - 1-2 x daily - 7 x weekly - 10 reps - 2 sets Bridge - 1-2 x daily - 7 x weekly - 2 sets - 10 reps Heel Toe Raises with Counter Support - 1-2 x daily - 7 x weekly - 2 sets - 10 reps Standing Hip Abduction with Counter Support - 1-2 x daily - 7 x weekly - 2 sets - 10 reps Standing Hip Extension with Counter Support - 1-2 x daily - 7 x weekly - 2 sets - 10 reps Mini Squat with Counter Support - 1-2 x daily - 7 x weekly - 2 sets - 10 reps

## 2020-11-25 NOTE — Therapy (Signed)
[place], [place], [address] Phone: [phone]   Fax:  [phone]  Physical Therapy Treatment  Patient Details  Name: Morgan Taylor MRN: [id number] Date of Birth: 10-19-1961 Referring Provider (PT): Marchia Bond, MD   Encounter Date: 11/25/2020   PT End of Session - 11/25/20 0947    Visit Number 3    Number of Visits 17    Date for PT Re-Evaluation 01/07/21    Authorization Type UHC MCR    Authorization Time Period FOTO by 6th visit    Progress Note Due on Visit 10    PT Start Time 0915    PT Stop Time 1000    PT Time Calculation (min) 45 min    Activity Tolerance Patient tolerated treatment well    Behavior During Therapy [facility] for tasks assessed/performed           Past Medical History:  Diagnosis Date   Anxiety    Arthritis    Breast cancer ([place])    Cancer ([place]) 2009   lumpectomy   Depression    takes Remeron nightly   Diabetes mellitus    takes Lantus and Victoza. Average fasting blood sugar runs 89   GERD (gastroesophageal reflux disease)    takes Omeprazole daily   Hyperlipemia    takes Lipitor daily   Hypertension    takes Amlodipine and Losartan daily   Hypothyroidism    takes Synthroid daily   Neck pain    Numbness    in both hands   Personal history of radiation therapy    Primary localized osteoarthrosis of right shoulder 03/21/2017    Past Surgical History:  Procedure Laterality Date   APPENDECTOMY     BREAST BIOPSY     BREAST LUMPECTOMY Left 2009   CESAREAN SECTION     COLONOSCOPY     DILATION AND CURETTAGE OF UTERUS     SHOULDER ARTHROSCOPY Left    cleaned out joint   TOTAL HIP ARTHROPLASTY Left 11/10/2020   Procedure: TOTAL HIP ARTHROPLASTY;  Surgeon: Marchia Bond, MD;  [facility];  Service: Orthopedics;  Laterality: Left;   TOTAL KNEE ARTHROPLASTY Right 08/15/2014   Procedure: RIGHT TOTAL KNEE ARTHROPLASTY;  Surgeon: Yvette Rack., MD;  Location: [place];  Service: Orthopedics;  Laterality: Right;   TOTAL KNEE ARTHROPLASTY Left 01/23/2015   Procedure: TOTAL KNEE ARTHROPLASTY;  Surgeon: Yvette Rack., MD;  Location: [place];  Service: Orthopedics;  Laterality: Left;   TOTAL SHOULDER ARTHROPLASTY Left 02/19/2016   Procedure: LEFT TOTAL SHOULDER ARTHROPLASTY;  Surgeon: Marchia Bond, MD;  Location: [place];  Service: Orthopedics;  Laterality: Left;   TOTAL SHOULDER ARTHROPLASTY Right 03/21/2017   Procedure: TOTAL SHOULDER ARTHROPLASTY;  Surgeon: Marchia Bond, MD;  Location: [place];  Service: Orthopedics;  Laterality: Right;    There were no vitals filed for this visit.   Subjective Assessment - 11/25/20 0945    Subjective Patient reports left hip feeling stiff today. She does have occasional sharp pains in left hip with activity. She is walking more and consistent with exercises.    Patient Stated Goals Get back to walking without device, lighter duty job    Currently in Pain? Yes    Pain Score 6     Pain Location Hip    Pain Orientation Left    Pain Descriptors / Indicators Aching;Sharp;Tightness    Pain Type Surgical pain    Pain Onset More than a  month ago    Pain Frequency Constant              [facility] PT Assessment - 11/25/20 0001      PROM   Overall PROM Comments Hip PROM achieves allowable ranges                         [facility] Adult PT Treatment/Exercise - 11/25/20 0001      Exercises   Exercises Knee/Hip      Knee/Hip Exercises: Aerobic   Nustep L5 x 5 min with LE only      Knee/Hip Exercises: Standing   Heel Raises 2 sets;15 reps    Heel Raises Limitations BUE support on FM bar    Hip Abduction 2 sets;10 reps    Abduction Limitations BUE support on walker    Hip Extension 2 sets;10 reps    Extension Limitations BUE support on walker    Functional Squat 2 sets;10 reps    Functional Squat Limitations holding on to FM bar, partial squat    Gait Training Proper heel-toe  progression, posture to avoid lateral trunk lean      Knee/Hip Exercises: Seated   Long Arc Quad 2 sets;15 reps    Long Arc Quad Weight 2 lbs.    Hamstring Curl 2 sets;15 reps    Hamstring Limitations red band      Knee/Hip Exercises: Supine   Bridges Limitations      Bridges with [employer] 2 sets;10 reps   partial range   Other Supine Knee/Hip Exercises Clamshell 2 x 15 with red    Other Supine Knee/Hip Exercises Marching 2 x 20   2nd set with red band     Manual Therapy   Manual Therapy Passive ROM    Passive ROM Hip PROM in all directions within allowable ranges                  PT Education - 11/25/20 0946    Education Details HEP update    Person(s) Educated Patient    Methods Explanation;Demonstration;Verbal cues;Handout    Comprehension Verbalized understanding;Need further instruction;Returned demonstration;Verbal cues required            PT Short Term Goals - 11/12/20 1317      PT SHORT TERM GOAL #1   Title Patient will be I with initial HEP to progress with PT    Time 4    Period Weeks    Status New    Target Date 12/10/20      PT SHORT TERM GOAL #2   Title Patient will be able to perform sit<>stand and bed mobility without assist to improve independence    Time 4    Period Weeks    Status New    Target Date 12/10/20      PT SHORT TERM GOAL #3   Title Patient will be able to ambulate household distances with LRAD without limitation    Time 4    Period Weeks    Status New    Target Date 12/10/20      PT SHORT TERM GOAL #4   Title PT will take FOTO assessment on 2nd visit and review with patient by 3rd visit    Time 3    Period Weeks    Status New    Target Date 12/03/20             PT Long Term Goals - 11/12/20 1319  PT LONG TERM GOAL #1   Title Patient will be I with final HEP to maintain progress from PT    Time 8    Period Weeks    Status New    Target Date 01/07/21      PT LONG TERM GOAL #2   Title Patient will be  able to ambulate community level distances with LRAD to improve shopping ability    Time 8    Period Weeks    Status New    Target Date 01/07/21      PT LONG TERM GOAL #3   Title Patient will exhibit left hip strength grossly >/= 4/5 MMT to negoitate stairs without limitation    Time 8    Period Weeks    Status New    Target Date 01/07/21      PT LONG TERM GOAL #4   Title Patient will demonstrate left hip motion grossly WFL to improve dressing ability    Time 8    Period Weeks    Status New    Target Date 01/07/21      PT LONG TERM GOAL #5   Title Establish FOTO goal at next visit    Time 8    Period Weeks    Status New    Target Date 01/07/21                 Plan - 11/25/20 0948    Clinical Impression Statement Patient tolerated therapy well with no advers effects. Therapy continued to focus on progress left hip strength and gait training with good tolerance. Patient demonstrated ability to ambulate without AD this visit but was instructed in using cane in case she goes on longer walks. Updated HEP and she was encouraged to focus on taking more frequent walks during the day. Patient would benefit from continued skilled PT to progress her strength and mobility in order to return to walking without limitation and maximize functional ability.    PT Treatment/Interventions ADLs/Self Care Home Management;Iontophoresis 4mg /ml Dexamethasone;Moist Heat;Traction;Ultrasound;Cryotherapy;Electrical Stimulation;Therapeutic exercise;Patient/family education;Manual techniques;Dry needling;Therapeutic activities;Neuromuscular re-education;Taping;Aquatic Therapy;Balance training;Functional mobility training;Stair training;Gait training;Passive range of motion;Joint Manipulations    PT Next Visit Plan Take FOTO!!!! Review HEP and progress PRN, manual and/or stretching for hip motion and pain reduction within allowable range, gait training progressing to cane or no AD, progress strengthening as  tolerated    PT Home Exercise Plan [place] and Agree with Plan of Care Patient           Patient will benefit from skilled therapeutic intervention in order to improve the following deficits and impairments:  Abnormal gait, Decreased range of motion, Difficulty walking, Decreased activity tolerance, Pain, Decreased balance, Decreased strength, Postural dysfunction  Visit Diagnosis: Pain in left hip  Stiffness of left hip, not elsewhere classified  Muscle weakness (generalized)  Other abnormalities of gait and mobility     Problem List Patient Active Problem List   Diagnosis Date Noted   S/P hip replacement, left 11/10/2020   Primary localized osteoarthrosis of right shoulder 03/21/2017   S/P shoulder replacement 02/19/2016   Genetic testing 12/11/2015   primary Osteoarthritis of left knee 01/23/2015   Osteoarthritis of right knee 08/15/2014   Primary cancer of lower outer quadrant of left female breast ([place]) 01/23/2012   HYPERGLYCEMIA 05/03/2007   HYPERPLASIA, ENDOMETRIAL NOS 02/22/2007   DYSMENORRHEA 10/18/2006   DEGENERATIVE JOINT DISEASE 10/18/2006   INSOMNIA 10/18/2006   CHEST PAIN 10/18/2006   OBESITY 10/05/2006  ANXIETY 10/05/2006   HYPERTENSION 10/05/2006   GASTROENTERITIS 10/05/2006   MENORRHAGIA, PERIMENOPAUSAL 10/05/2006    Hilda Blades, PT, DPT, LAT, ATC 11/25/20  10:19 AM Phone: [phone] Fax: [facility] [address] [place], [place], [address] Phone: [phone]   Fax:  [phone]  Name: Morgan Taylor MRN: [id number] Date of Birth: [birth year]

## 2020-12-02 ENCOUNTER — Ambulatory Visit: Payer: BC Managed Care – PPO | Admitting: Physical Therapy

## 2020-12-03 ENCOUNTER — Telehealth: Payer: Self-pay | Admitting: Physical Therapy

## 2020-12-03 NOTE — Telephone Encounter (Signed)
Called patient regarding no-show appointment, Patient reports she just forgot, She was advised of the next appointment and asked to please call if she is not going to make it,.

## 2020-12-04 ENCOUNTER — Ambulatory Visit: Payer: BC Managed Care – PPO | Admitting: Physical Therapy

## 2020-12-04 ENCOUNTER — Other Ambulatory Visit: Payer: Self-pay

## 2020-12-04 ENCOUNTER — Encounter: Payer: Self-pay | Admitting: Physical Therapy

## 2020-12-04 DIAGNOSIS — M25552 Pain in left hip: Secondary | ICD-10-CM

## 2020-12-04 DIAGNOSIS — M25652 Stiffness of left hip, not elsewhere classified: Secondary | ICD-10-CM

## 2020-12-04 DIAGNOSIS — R2689 Other abnormalities of gait and mobility: Secondary | ICD-10-CM

## 2020-12-04 DIAGNOSIS — M6281 Muscle weakness (generalized): Secondary | ICD-10-CM

## 2020-12-04 NOTE — Therapy (Signed)
[place], [place], [address] Phone: [phone]   Fax:  [phone]  Physical Therapy Treatment  Patient Details  Name: Morgan Taylor MRN: [id number] Date of Birth: [birth year] Referring Provider (PT): Marchia Bond, MD   Encounter Date: 12/04/2020   PT End of Session - 12/04/20 1025    Visit Number 4    Number of Visits 17    Date for PT Re-Evaluation 01/07/21    Authorization Type UHC MCR    Authorization Time Period FOTO 10th visit (initial completed on visit 4)    Progress Note Due on Visit 10    PT Start Time 1019    PT Stop Time 1057    PT Time Calculation (min) 38 min    Activity Tolerance Patient tolerated treatment well    Behavior During Therapy [facility] for tasks assessed/performed           Past Medical History:  Diagnosis Date  . Anxiety   . Arthritis   . Breast cancer ([place])   . Cancer [facility]) 2009   lumpectomy  . Depression    takes Remeron nightly  . Diabetes mellitus    takes Lantus and Victoza. Average fasting blood sugar runs 89  . GERD (gastroesophageal reflux disease)    takes Omeprazole daily  . Hyperlipemia    takes Lipitor daily  . Hypertension    takes Amlodipine and Losartan daily  . Hypothyroidism    takes Synthroid daily  . Neck pain   . Numbness    in both hands  . Personal history of radiation therapy   . Primary localized osteoarthrosis of right shoulder 03/21/2017    Past Surgical History:  Procedure Laterality Date  . APPENDECTOMY    . BREAST BIOPSY    . BREAST LUMPECTOMY Left 2009  . CESAREAN SECTION    . COLONOSCOPY    . DILATION AND CURETTAGE OF UTERUS    . SHOULDER ARTHROSCOPY Left    cleaned out joint  . TOTAL HIP ARTHROPLASTY Left 11/10/2020   Procedure: TOTAL HIP ARTHROPLASTY;  Surgeon: Marchia Bond, MD;  [facility];  Service: Orthopedics;  Laterality: Left;  . TOTAL KNEE ARTHROPLASTY Right 08/15/2014   Procedure: RIGHT TOTAL KNEE  ARTHROPLASTY;  Surgeon: Yvette Rack., MD;  Location: [place];  Service: Orthopedics;  Laterality: Right;  . TOTAL KNEE ARTHROPLASTY Left 01/23/2015   Procedure: TOTAL KNEE ARTHROPLASTY;  Surgeon: Yvette Rack., MD;  Location: [place];  Service: Orthopedics;  Laterality: Left;  . TOTAL SHOULDER ARTHROPLASTY Left 02/19/2016   Procedure: LEFT TOTAL SHOULDER ARTHROPLASTY;  Surgeon: Marchia Bond, MD;  Location: [place];  Service: Orthopedics;  Laterality: Left;  . TOTAL SHOULDER ARTHROPLASTY Right 03/21/2017   Procedure: TOTAL SHOULDER ARTHROPLASTY;  Surgeon: Marchia Bond, MD;  Location: [place];  Service: Orthopedics;  Laterality: Right;    There were no vitals filed for this visit.   Subjective Assessment - 12/04/20 1021    Subjective It is sore, it always is before it rains. My dog passed away yesterday. I am getting stabbing pains in it today.    Currently in Pain? Yes    Pain Score 7     Pain Location Hip    Pain Orientation Left    Pain Descriptors / Indicators Stabbing    Aggravating Factors  constant pain              [facility] PT Assessment - 12/04/20 0001  Posture/Postural Control   Posture Comments Lt post pelvic rotation/Rt ant creating LLD (Lt longer)                         [facility] Adult PT Treatment/Exercise - 12/04/20 0001      Knee/Hip Exercises: Supine   Hip Adduction Isometric Limitations ball bw knees with pelvic tilt    Single Leg Bridge Right   pushing through Rt leg   Other Supine Knee/Hip Exercises isomeric Rt leg ext on PT shoulder 2x5 reps 5s holds    Other Supine Knee/Hip Exercises hooklying left leg march      Manual Therapy   Soft tissue mobilization roller Rt hip    Passive ROM prone Lt uper quadrant sacral PA creep hold                  PT Education - 12/04/20 1144    Education Details pelvic rotation & LLD    Person(s) Educated Patient    Methods Explanation    Comprehension Verbalized understanding;Need further instruction             PT Short Term Goals - 11/12/20 1317      PT SHORT TERM GOAL #1   Title Patient will be I with initial HEP to progress with PT    Time 4    Period Weeks    Status New    Target Date 12/10/20      PT SHORT TERM GOAL #2   Title Patient will be able to perform sit<>stand and bed mobility without assist to improve independence    Time 4    Period Weeks    Status New    Target Date 12/10/20      PT SHORT TERM GOAL #3   Title Patient will be able to ambulate household distances with LRAD without limitation    Time 4    Period Weeks    Status New    Target Date 12/10/20      PT SHORT TERM GOAL #4   Title PT will take FOTO assessment on 2nd visit and review with patient by 3rd visit    Time 3    Period Weeks    Status New    Target Date 12/03/20             PT Long Term Goals - 11/12/20 1319      PT LONG TERM GOAL #1   Title Patient will be I with final HEP to maintain progress from PT    Time 8    Period Weeks    Status New    Target Date 01/07/21      PT LONG TERM GOAL #2   Title Patient will be able to ambulate community level distances with LRAD to improve shopping ability    Time 8    Period Weeks    Status New    Target Date 01/07/21      PT LONG TERM GOAL #3   Title Patient will exhibit left hip strength grossly >/= 4/5 MMT to negoitate stairs without limitation    Time 8    Period Weeks    Status New    Target Date 01/07/21      PT LONG TERM GOAL #4   Title Patient will demonstrate left hip motion grossly WFL to improve dressing ability    Time 8    Period Weeks    Status New    Target  Date 01/07/21      PT LONG TERM GOAL #5   Title Establish FOTO goal at next visit    Time 8    Period Weeks    Status New    Target Date 01/07/21                 Plan - 12/04/20 1057    Clinical Impression Statement Lt LE noted to be longer today but pelvic rotation is evident. provided with short HEP to focus on just until her next  appointment and will reassess leg length. Legs became less unequal as treatment progressed today so I am hopeful that she does not have a true LLD. Core weakness noted and will benefit from training.    PT Treatment/Interventions ADLs/Self Care Home Management;Iontophoresis 4mg /ml Dexamethasone;Moist Heat;Traction;Ultrasound;Cryotherapy;Electrical Stimulation;Therapeutic exercise;Patient/family education;Manual techniques;Dry needling;Therapeutic activities;Neuromuscular re-education;Taping;Aquatic Therapy;Balance training;Functional mobility training;Stair training;Gait training;Passive range of motion;Joint Manipulations    PT Next Visit Plan reassess LLD, lumbopelvic stability    PT Home Exercise Plan [email or web address], [email or web address] (given 12/10 for until next apt)    Consulted and Agree with Plan of Care Patient           Patient will benefit from skilled therapeutic intervention in order to improve the following deficits and impairments:  Abnormal gait,Decreased range of motion,Difficulty walking,Decreased activity tolerance,Pain,Decreased balance,Decreased strength,Postural dysfunction  Visit Diagnosis: Pain in left hip  Stiffness of left hip, not elsewhere classified  Muscle weakness (generalized)  Other abnormalities of gait and mobility     Problem List Patient Active Problem List   Diagnosis Date Noted  . S/P hip replacement, left 11/10/2020  . Primary localized osteoarthrosis of right shoulder 03/21/2017  . S/P shoulder replacement 02/19/2016  . Genetic testing 12/11/2015  . primary Osteoarthritis of left knee 01/23/2015  . Osteoarthritis of right knee 08/15/2014  . Primary cancer of lower outer quadrant of left female breast ([place]) 01/23/2012  . HYPERGLYCEMIA 05/03/2007  . HYPERPLASIA, ENDOMETRIAL NOS 02/22/2007  . DYSMENORRHEA 10/18/2006  . DEGENERATIVE JOINT DISEASE 10/18/2006  . INSOMNIA 10/18/2006  . CHEST PAIN 10/18/2006  . OBESITY 10/05/2006  . ANXIETY 10/05/2006  .  HYPERTENSION 10/05/2006  . GASTROENTERITIS 10/05/2006  . MENORRHAGIA, PERIMENOPAUSAL 10/05/2006   Gay Filler. Jaison Petraglia PT, DPT 12/04/20 11:44 AM   [facility] [address] [place], [place], [address] Phone: [phone]   Fax:  [phone]  Name: Morgan Taylor MRN: [id number] Date of Birth: 1961-01-13

## 2020-12-07 ENCOUNTER — Other Ambulatory Visit: Payer: Self-pay

## 2020-12-07 ENCOUNTER — Encounter: Payer: Self-pay | Admitting: Physical Therapy

## 2020-12-07 ENCOUNTER — Ambulatory Visit: Payer: BC Managed Care – PPO | Admitting: Physical Therapy

## 2020-12-07 DIAGNOSIS — R2689 Other abnormalities of gait and mobility: Secondary | ICD-10-CM

## 2020-12-07 DIAGNOSIS — M25552 Pain in left hip: Secondary | ICD-10-CM | POA: Diagnosis not present

## 2020-12-07 DIAGNOSIS — M6281 Muscle weakness (generalized): Secondary | ICD-10-CM

## 2020-12-07 DIAGNOSIS — M25652 Stiffness of left hip, not elsewhere classified: Secondary | ICD-10-CM

## 2020-12-07 NOTE — Patient Instructions (Signed)
Access Code: [email or web address] URL: [email or web address] Date: 12/07/2020 Prepared by: Hilda Blades  Exercises Supine Heel Slide - 1-2 x daily - 7 x weekly - 10 reps - 2 sets Supine March - 1-2 x daily - 7 x weekly - 10 reps - 2 sets Bridge - 1-2 x daily - 7 x weekly - 2 sets - 10 reps Seated Knee Extension with Anchored Resistance - 1-2 x daily - 7 x weekly - 2 sets - 15 reps Seated Knee Flexion with Anchored Resistance - 1-2 x daily - 7 x weekly - 2 sets - 15 reps Heel Toe Raises with Counter Support - 1-2 x daily - 7 x weekly - 2 sets - 10 reps Standing Hip Abduction with Counter Support - 1-2 x daily - 7 x weekly - 2 sets - 10 reps Standing Hip Extension with Counter Support - 1-2 x daily - 7 x weekly - 2 sets - 10 reps Mini Squat with Counter Support - 1-2 x daily - 7 x weekly - 2 sets - 10 reps

## 2020-12-07 NOTE — Therapy (Signed)
[place], [place], [address] Phone: [phone]   Fax:  [phone]  Physical Therapy Treatment  Patient Details  Name: Morgan Taylor MRN: [id number] Date of Birth: 1961/07/29 Referring Provider (PT): Marchia Bond, MD   Encounter Date: 12/07/2020   PT End of Session - 12/07/20 1054    Visit Number 5    Number of Visits 17    Date for PT Re-Evaluation 01/07/21    Authorization Type UHC MCR    Authorization Time Period FOTO 10th visit (initial completed on visit 4)    Progress Note Due on Visit 10    PT Start Time 1046    PT Stop Time 1125    PT Time Calculation (min) 39 min    Activity Tolerance Patient tolerated treatment well    Behavior During Therapy [facility] for tasks assessed/performed           Past Medical History:  Diagnosis Date  . Anxiety   . Arthritis   . Breast cancer ([place])   . Cancer [facility]) 2009   lumpectomy  . Depression    takes Remeron nightly  . Diabetes mellitus    takes Lantus and Victoza. Average fasting blood sugar runs 89  . GERD (gastroesophageal reflux disease)    takes Omeprazole daily  . Hyperlipemia    takes Lipitor daily  . Hypertension    takes Amlodipine and Losartan daily  . Hypothyroidism    takes Synthroid daily  . Neck pain   . Numbness    in both hands  . Personal history of radiation therapy   . Primary localized osteoarthrosis of right shoulder 03/21/2017    Past Surgical History:  Procedure Laterality Date  . APPENDECTOMY    . BREAST BIOPSY    . BREAST LUMPECTOMY Left 2009  . CESAREAN SECTION    . COLONOSCOPY    . DILATION AND CURETTAGE OF UTERUS    . SHOULDER ARTHROSCOPY Left    cleaned out joint  . TOTAL HIP ARTHROPLASTY Left 11/10/2020   Procedure: TOTAL HIP ARTHROPLASTY;  Surgeon: Marchia Bond, MD;  Location: [facility];  Service: Orthopedics;  Laterality: Left;  . TOTAL KNEE ARTHROPLASTY Right 08/15/2014   Procedure: RIGHT TOTAL KNEE  ARTHROPLASTY;  Surgeon: Yvette Rack., MD;  Location: [place];  Service: Orthopedics;  Laterality: Right;  . TOTAL KNEE ARTHROPLASTY Left 01/23/2015   Procedure: TOTAL KNEE ARTHROPLASTY;  Surgeon: Yvette Rack., MD;  Location: [place];  Service: Orthopedics;  Laterality: Left;  . TOTAL SHOULDER ARTHROPLASTY Left 02/19/2016   Procedure: LEFT TOTAL SHOULDER ARTHROPLASTY;  Surgeon: Marchia Bond, MD;  Location: [place];  Service: Orthopedics;  Laterality: Left;  . TOTAL SHOULDER ARTHROPLASTY Right 03/21/2017   Procedure: TOTAL SHOULDER ARTHROPLASTY;  Surgeon: Marchia Bond, MD;  Location: [place];  Service: Orthopedics;  Laterality: Right;    There were no vitals filed for this visit.   Subjective Assessment - 12/07/20 1050    Subjective Patient reports hip continues to improve. She does have soreness and occasional sharp pains.    Patient Stated Goals Get back to walking without device, lighter duty job    Currently in Pain? Yes    Pain Score 5     Pain Location Hip    Pain Orientation Left    Pain Descriptors / Indicators Sore    Pain Type Surgical pain    Pain Onset More than a month ago    Pain Frequency Constant  [facility] PT Assessment - 12/07/20 0001      Ambulation/Gait   Ambulation/Gait Yes    Ambulation/Gait Assistance 7: Independent    Gait Comments Mildly antalgic on left, much improved heel-toe progression and minimal trendelenburg                         [facility] Adult PT Treatment/Exercise - 12/07/20 0001      Exercises   Exercises Knee/Hip      Knee/Hip Exercises: Aerobic   Nustep L5 x 5 min with LE only      Knee/Hip Exercises: Standing   Heel Raises 2 sets;15 reps    Heel Raises Limitations BUE support on FM bar    Hip Abduction 2 sets;10 reps    Abduction Limitations BUE support on FM bar    Hip Extension 2 sets;10 reps    Extension Limitations BUE support on FM bar    Functional Squat 2 sets;15 reps    Functional Squat Limitations  holding on to FM bar, to 90 deg      Knee/Hip Exercises: Seated   Long Arc Quad 2 sets;15 reps    Long Arc Quad Limitations red band loop    Hamstring Curl 2 sets;15 reps    Hamstring Limitations red band loop      Knee/Hip Exercises: Supine   Bridges Limitations x 15 partial range    Bridges with [employer] 15 reps   partial range   Straight Leg Raises 2 sets;10 reps                  PT Education - 12/07/20 1052    Education Details HEP    Person(s) Educated Patient    Methods Explanation;Demonstration;Verbal cues;Handout    Comprehension Verbalized understanding;Need further instruction;Returned demonstration;Verbal cues required            PT Short Term Goals - 12/07/20 1122      PT SHORT TERM GOAL #1   Title Patient will be I with initial HEP to progress with PT    Time 4    Period Weeks    Status New    Target Date 12/10/20      PT SHORT TERM GOAL #2   Title Patient will be able to perform sit<>stand and bed mobility without assist to improve independence    Time 4    Period Weeks    Status Achieved    Target Date 12/10/20      PT SHORT TERM GOAL #3   Title Patient will be able to ambulate household distances with LRAD without limitation    Time 4    Period Weeks    Status Achieved    Target Date 12/10/20      PT SHORT TERM GOAL #4   Title PT will take FOTO assessment on 2nd visit and review with patient by 3rd visit    Time 3    Period Weeks    Status Achieved    Target Date 12/03/20             PT Long Term Goals - 11/12/20 1319      PT LONG TERM GOAL #1   Title Patient will be I with final HEP to maintain progress from PT    Time 8    Period Weeks    Status New    Target Date 01/07/21      PT LONG TERM GOAL #2   Title Patient will  be able to ambulate community level distances with LRAD to improve shopping ability    Time 8    Period Weeks    Status New    Target Date 01/07/21      PT LONG TERM GOAL #3   Title Patient will  exhibit left hip strength grossly >/= 4/5 MMT to negoitate stairs without limitation    Time 8    Period Weeks    Status New    Target Date 01/07/21      PT LONG TERM GOAL #4   Title Patient will demonstrate left hip motion grossly WFL to improve dressing ability    Time 8    Period Weeks    Status New    Target Date 01/07/21      PT LONG TERM GOAL #5   Title Establish FOTO goal at next visit    Time 8    Period Weeks    Status New    Target Date 01/07/21                 Plan - 12/07/20 1055    Clinical Impression Statement Patient tolerated therapy well with no adverse effects. She is progressing well with hip/core strengthening and reports continued improvement. Her gait is much improved and she is no longer using AD. Patient notes feeling down due to her dog dying and other personal factors. Patient would benefit from continued skilled PT to progress her strength and mobility in order to return to walking without limitation and maximize functional ability.    PT Treatment/Interventions ADLs/Self Care Home Management;Iontophoresis 4mg /ml Dexamethasone;Moist Heat;Traction;Ultrasound;Cryotherapy;Electrical Stimulation;Therapeutic exercise;Patient/family education;Manual techniques;Dry needling;Therapeutic activities;Neuromuscular re-education;Taping;Aquatic Therapy;Balance training;Functional mobility training;Stair training;Gait training;Passive range of motion;Joint Manipulations    PT Next Visit Plan Review HEP and progress PRN, manual and/or stretching for hip motion and pain reduction within allowable range, progress strengthening as tolerated    PT Home Exercise Plan [email or web address], [id number] (given 12/10 for until next apt)    Consulted and Agree with Plan of Care Patient           Patient will benefit from skilled therapeutic intervention in order to improve the following deficits and impairments:  Abnormal gait,Decreased range of motion,Difficulty walking,Decreased activity  tolerance,Pain,Decreased balance,Decreased strength,Postural dysfunction  Visit Diagnosis: Pain in left hip  Stiffness of left hip, not elsewhere classified  Muscle weakness (generalized)  Other abnormalities of gait and mobility     Problem List Patient Active Problem List   Diagnosis Date Noted  . S/P hip replacement, left 11/10/2020  . Primary localized osteoarthrosis of right shoulder 03/21/2017  . S/P shoulder replacement 02/19/2016  . Genetic testing 12/11/2015  . primary Osteoarthritis of left knee 01/23/2015  . Osteoarthritis of right knee 08/15/2014  . Primary cancer of lower outer quadrant of left female breast ([place]) 01/23/2012  . HYPERGLYCEMIA 05/03/2007  . HYPERPLASIA, ENDOMETRIAL NOS 02/22/2007  . DYSMENORRHEA 10/18/2006  . DEGENERATIVE JOINT DISEASE 10/18/2006  . INSOMNIA 10/18/2006  . CHEST PAIN 10/18/2006  . OBESITY 10/05/2006  . ANXIETY 10/05/2006  . HYPERTENSION 10/05/2006  . GASTROENTERITIS 10/05/2006  . MENORRHAGIA, PERIMENOPAUSAL 10/05/2006    Hilda Blades, PT, DPT, LAT, ATC 12/07/20  11:25 AM Phone: [phone] Fax: [place] [facility] [address] [place], [place], [address] Phone: [phone]   Fax:  [phone]  Name: TANGA GLOOR MRN: [id number] Date of Birth: 1961/04/15

## 2020-12-09 ENCOUNTER — Encounter: Payer: Self-pay | Admitting: Physical Therapy

## 2020-12-09 ENCOUNTER — Other Ambulatory Visit: Payer: Self-pay

## 2020-12-09 ENCOUNTER — Ambulatory Visit: Payer: BC Managed Care – PPO | Admitting: Physical Therapy

## 2020-12-09 DIAGNOSIS — M6281 Muscle weakness (generalized): Secondary | ICD-10-CM

## 2020-12-09 DIAGNOSIS — M25552 Pain in left hip: Secondary | ICD-10-CM

## 2020-12-09 DIAGNOSIS — R2689 Other abnormalities of gait and mobility: Secondary | ICD-10-CM

## 2020-12-09 DIAGNOSIS — M25652 Stiffness of left hip, not elsewhere classified: Secondary | ICD-10-CM

## 2020-12-09 NOTE — Therapy (Signed)
[place], [place], [address] Phone: [phone]   Fax:  [phone]  Physical Therapy Treatment  Patient Details  Name: Morgan Taylor MRN: [id number] Date of Birth: 05-Feb-1961 Referring Provider (PT): Marchia Bond, MD   Encounter Date: 12/09/2020   PT End of Session - 12/09/20 1147    Visit Number 6    Number of Visits 17    Date for PT Re-Evaluation 01/07/21    Authorization Type UHC MCR    Authorization Time Period FOTO 10th visit (initial completed on visit 4)    Progress Note Due on Visit 10    PT Start Time 1135    PT Stop Time 1214    PT Time Calculation (min) 39 min    Activity Tolerance Patient tolerated treatment well    Behavior During Therapy [facility] for tasks assessed/performed           Past Medical History:  Diagnosis Date   Anxiety    Arthritis    Breast cancer ([place])    Cancer ([place]) 2009   lumpectomy   Depression    takes Remeron nightly   Diabetes mellitus    takes Lantus and Victoza. Average fasting blood sugar runs 89   GERD (gastroesophageal reflux disease)    takes Omeprazole daily   Hyperlipemia    takes Lipitor daily   Hypertension    takes Amlodipine and Losartan daily   Hypothyroidism    takes Synthroid daily   Neck pain    Numbness    in both hands   Personal history of radiation therapy    Primary localized osteoarthrosis of right shoulder 03/21/2017    Past Surgical History:  Procedure Laterality Date   APPENDECTOMY     BREAST BIOPSY     BREAST LUMPECTOMY Left 2009   CESAREAN SECTION     COLONOSCOPY     DILATION AND CURETTAGE OF UTERUS     SHOULDER ARTHROSCOPY Left    cleaned out joint   TOTAL HIP ARTHROPLASTY Left 11/10/2020   Procedure: TOTAL HIP ARTHROPLASTY;  Surgeon: Marchia Bond, MD;  [facility];  Service: Orthopedics;  Laterality: Left;   TOTAL KNEE ARTHROPLASTY Right 08/15/2014   Procedure: RIGHT TOTAL KNEE  ARTHROPLASTY;  Surgeon: Yvette Rack., MD;  Location: [place];  Service: Orthopedics;  Laterality: Right;   TOTAL KNEE ARTHROPLASTY Left 01/23/2015   Procedure: TOTAL KNEE ARTHROPLASTY;  Surgeon: Yvette Rack., MD;  Location: [place];  Service: Orthopedics;  Laterality: Left;   TOTAL SHOULDER ARTHROPLASTY Left 02/19/2016   Procedure: LEFT TOTAL SHOULDER ARTHROPLASTY;  Surgeon: Marchia Bond, MD;  Location: [place];  Service: Orthopedics;  Laterality: Left;   TOTAL SHOULDER ARTHROPLASTY Right 03/21/2017   Procedure: TOTAL SHOULDER ARTHROPLASTY;  Surgeon: Marchia Bond, MD;  Location: [place];  Service: Orthopedics;  Laterality: Right;    There were no vitals filed for this visit.   Subjective Assessment - 12/09/20 1144    Subjective Patient reports hip is feeling ok, her right ankle has been bothering her this morning.    Patient Stated Goals Get back to walking without device, lighter duty job    Currently in Pain? Yes    Pain Score 4     Pain Location Hip    Pain Orientation Left    Pain Descriptors / Indicators Sore;Aching    Pain Type Surgical pain    Pain Onset More than a month ago    Pain Frequency  Constant              [facility] PT Assessment - 12/09/20 0001      Ambulation/Gait   Gait Comments Minimal antalgic gait on left                         [facility] Adult PT Treatment/Exercise - 12/09/20 0001      Exercises   Exercises Knee/Hip      Knee/Hip Exercises: Aerobic   Nustep L5 x 4 min with LE only      Knee/Hip Exercises: Seated   Long Arc Quad 2 sets;15 reps    Long Arc Quad Limitations red band loop    Hamstring Curl 2 sets;15 reps    Hamstring Limitations red band loop      Knee/Hip Exercises: Supine   Bridges with Ball Squeeze 2 sets;15 reps   partial range   Straight Leg Raises 2 sets;10 reps    Other Supine Knee/Hip Exercises Clam with red 2 x 15    Other Supine Knee/Hip Exercises Marching with red 2 x 20      Knee/Hip Exercises: Sidelying    Hip ABduction 2 sets;10 reps    Hip ABduction Limitations left only                  PT Education - 12/09/20 1146    Education Details HEP    Person(s) Educated Patient    Methods Explanation    Comprehension Verbalized understanding;Need further instruction            PT Short Term Goals - 12/09/20 1152      PT SHORT TERM GOAL #1   Title Patient will be I with initial HEP to progress with PT    Time 4    Period Weeks    Status Achieved    Target Date 12/10/20      PT SHORT TERM GOAL #2   Title Patient will be able to perform sit<>stand and bed mobility without assist to improve independence    Time 4    Period Weeks    Status Achieved    Target Date 12/10/20      PT SHORT TERM GOAL #3   Title Patient will be able to ambulate household distances with LRAD without limitation    Time 4    Period Weeks    Status Achieved    Target Date 12/10/20      PT SHORT TERM GOAL #4   Title PT will take FOTO assessment on 2nd visit and review with patient by 3rd visit    Time 3    Period Weeks    Status Achieved    Target Date 12/03/20             PT Long Term Goals - 11/12/20 1319      PT LONG TERM GOAL #1   Title Patient will be I with final HEP to maintain progress from PT    Time 8    Period Weeks    Status New    Target Date 01/07/21      PT LONG TERM GOAL #2   Title Patient will be able to ambulate community level distances with LRAD to improve shopping ability    Time 8    Period Weeks    Status New    Target Date 01/07/21      PT LONG TERM GOAL #3   Title Patient will exhibit left  hip strength grossly >/= 4/5 MMT to negoitate stairs without limitation    Time 8    Period Weeks    Status New    Target Date 01/07/21      PT LONG TERM GOAL #4   Title Patient will demonstrate left hip motion grossly WFL to improve dressing ability    Time 8    Period Weeks    Status New    Target Date 01/07/21      PT LONG TERM GOAL #5   Title Establish  FOTO goal at next visit    Time 8    Period Weeks    Status New    Target Date 01/07/21                 Plan - 12/09/20 1147    Clinical Impression Statement Patient tolerated therapy well with no adverse effects. She is continuing to progress well with strengthening exercises. Focused more on mat based strengthening due to right ankle pain. Minimal antalgic gait on left noted this visit. No change to HEP this visit.  Patient would benefit from continued skilled PT to progress her strength and mobility in order to return to walking without limitation and maximize functional ability.    PT Treatment/Interventions ADLs/Self Care Home Management;Iontophoresis 4mg /ml Dexamethasone;Moist Heat;Traction;Ultrasound;Cryotherapy;Electrical Stimulation;Therapeutic exercise;Patient/family education;Manual techniques;Dry needling;Therapeutic activities;Neuromuscular re-education;Taping;Aquatic Therapy;Balance training;Functional mobility training;Stair training;Gait training;Passive range of motion;Joint Manipulations    PT Next Visit Plan Review HEP and progress PRN, manual and/or stretching for hip motion and pain reduction within allowable range, progress strengthening as tolerated    PT Home Exercise Plan [email or web address], [id number] (PRN)    Consulted and Agree with Plan of Care Patient           Patient will benefit from skilled therapeutic intervention in order to improve the following deficits and impairments:  Abnormal gait,Decreased range of motion,Difficulty walking,Decreased activity tolerance,Pain,Decreased balance,Decreased strength,Postural dysfunction  Visit Diagnosis: Pain in left hip  Stiffness of left hip, not elsewhere classified  Muscle weakness (generalized)  Other abnormalities of gait and mobility     Problem List Patient Active Problem List   Diagnosis Date Noted   S/P hip replacement, left 11/10/2020   Primary localized osteoarthrosis of right shoulder 03/21/2017    S/P shoulder replacement 02/19/2016   Genetic testing 12/11/2015   primary Osteoarthritis of left knee 01/23/2015   Osteoarthritis of right knee 08/15/2014   Primary cancer of lower outer quadrant of left female breast ([place]) 01/23/2012   HYPERGLYCEMIA 05/03/2007   HYPERPLASIA, ENDOMETRIAL NOS 02/22/2007   DYSMENORRHEA 10/18/2006   DEGENERATIVE JOINT DISEASE 10/18/2006   INSOMNIA 10/18/2006   CHEST PAIN 10/18/2006   OBESITY 10/05/2006   ANXIETY 10/05/2006   HYPERTENSION 10/05/2006   GASTROENTERITIS 10/05/2006   MENORRHAGIA, PERIMENOPAUSAL 10/05/2006    Hilda Blades, PT, DPT, LAT, ATC 12/09/20  12:14 PM Phone: [phone] Fax: [facility] [address] [place], [place], [address] Phone: [phone]   Fax:  [phone]  Name: Morgan Taylor MRN: [id number] Date of Birth: 05-Mar-1961

## 2020-12-14 ENCOUNTER — Other Ambulatory Visit: Payer: Self-pay

## 2020-12-14 ENCOUNTER — Ambulatory Visit: Payer: BC Managed Care – PPO | Admitting: Physical Therapy

## 2020-12-14 ENCOUNTER — Encounter: Payer: Self-pay | Admitting: Physical Therapy

## 2020-12-14 DIAGNOSIS — M25552 Pain in left hip: Secondary | ICD-10-CM

## 2020-12-14 DIAGNOSIS — M25652 Stiffness of left hip, not elsewhere classified: Secondary | ICD-10-CM

## 2020-12-14 DIAGNOSIS — R2689 Other abnormalities of gait and mobility: Secondary | ICD-10-CM

## 2020-12-14 DIAGNOSIS — M6281 Muscle weakness (generalized): Secondary | ICD-10-CM

## 2020-12-14 NOTE — Therapy (Signed)
[place], [place], [address] Phone: [phone]   Fax:  [phone]  Physical Therapy Treatment  Patient Details  Name: Morgan Taylor MRN: [id number] Date of Birth: [birth year] Referring Provider (PT): Marchia Bond, MD   Encounter Date: 12/14/2020   PT End of Session - 12/14/20 0925    Visit Number 7    Number of Visits 17    Date for PT Re-Evaluation 01/07/21    Authorization Type UHC MCR    Authorization Time Period FOTO 10th visit (initial completed on visit 4)    Progress Note Due on Visit 10    PT Start Time 0915    PT Stop Time 0955    PT Time Calculation (min) 40 min    Activity Tolerance Patient tolerated treatment well    Behavior During Therapy [facility] for tasks assessed/performed           Past Medical History:  Diagnosis Date  . Anxiety   . Arthritis   . Breast cancer ([place])   . Cancer [facility]) 2009   lumpectomy  . Depression    takes Remeron nightly  . Diabetes mellitus    takes Lantus and Victoza. Average fasting blood sugar runs 89  . GERD (gastroesophageal reflux disease)    takes Omeprazole daily  . Hyperlipemia    takes Lipitor daily  . Hypertension    takes Amlodipine and Losartan daily  . Hypothyroidism    takes Synthroid daily  . Neck pain   . Numbness    in both hands  . Personal history of radiation therapy   . Primary localized osteoarthrosis of right shoulder 03/21/2017    Past Surgical History:  Procedure Laterality Date  . APPENDECTOMY    . BREAST BIOPSY    . BREAST LUMPECTOMY Left 2009  . CESAREAN SECTION    . COLONOSCOPY    . DILATION AND CURETTAGE OF UTERUS    . SHOULDER ARTHROSCOPY Left    cleaned out joint  . TOTAL HIP ARTHROPLASTY Left 11/10/2020   Procedure: TOTAL HIP ARTHROPLASTY;  Surgeon: Marchia Bond, MD;  Location: [facility];  Service: Orthopedics;  Laterality: Left;  . TOTAL KNEE ARTHROPLASTY Right 08/15/2014   Procedure: RIGHT TOTAL KNEE  ARTHROPLASTY;  Surgeon: Yvette Rack., MD;  Location: [place];  Service: Orthopedics;  Laterality: Right;  . TOTAL KNEE ARTHROPLASTY Left 01/23/2015   Procedure: TOTAL KNEE ARTHROPLASTY;  Surgeon: Yvette Rack., MD;  Location: [place];  Service: Orthopedics;  Laterality: Left;  . TOTAL SHOULDER ARTHROPLASTY Left 02/19/2016   Procedure: LEFT TOTAL SHOULDER ARTHROPLASTY;  Surgeon: Marchia Bond, MD;  Location: [place];  Service: Orthopedics;  Laterality: Left;  . TOTAL SHOULDER ARTHROPLASTY Right 03/21/2017   Procedure: TOTAL SHOULDER ARTHROPLASTY;  Surgeon: Marchia Bond, MD;  Location: [place];  Service: Orthopedics;  Laterality: Right;    There were no vitals filed for this visit.   Subjective Assessment - 12/14/20 0921    Subjective Patient reports feeling better this visit and states she can walk almost without a limp. Ankle feeling much better this visit.    Patient Stated Goals Get back to walking without device, lighter duty job    Currently in Pain? Yes    Pain Score 3     Pain Location Hip    Pain Orientation Left    Pain Descriptors / Indicators Aching;Sore    Pain Type Surgical pain    Pain Onset More than a month  ago    Pain Frequency Constant              [facility] PT Assessment - 12/14/20 0001      ROM / Strength   AROM / PROM / Strength AROM;Strength      AROM   Overall AROM Comments AROM left hip within appropriate range      Strength   Right Hip Flexion 4/5    Right Hip Extension 4-/5    Right Hip ABduction 4-/5    Left Hip Flexion 4-/5    Left Hip Extension 3+/5    Left Hip ABduction 3+/5                         [facility] Adult PT Treatment/Exercise - 12/14/20 0001      Exercises   Exercises Knee/Hip      Knee/Hip Exercises: Aerobic   Nustep L5 x 5 min with LE only      Knee/Hip Exercises: Standing   Heel Raises 2 sets;15 reps    Lateral Step Up 2 sets;10 reps    Lateral Step Up Limitations 6" step height    Forward Step Up 2 sets;10 reps     Forward Step Up Limitations 6" step height      Knee/Hip Exercises: Seated   Sit to Sand 2 sets;10 reps      Knee/Hip Exercises: Supine   Bridges with Ball Squeeze 2 sets;15 reps   partial range   Straight Leg Raises 2 sets;10 reps    Other Supine Knee/Hip Exercises Marching with red x 20      Knee/Hip Exercises: Sidelying   Hip ABduction 2 sets;10 reps    Hip ABduction Limitations left only                  PT Education - 12/14/20 0923    Education Details HEP    Person(s) Educated Patient    Methods Explanation;Demonstration;Verbal cues;Handout    Comprehension Verbalized understanding;Returned demonstration;Verbal cues required;Need further instruction            PT Short Term Goals - 12/09/20 1152      PT SHORT TERM GOAL #1   Title Patient will be I with initial HEP to progress with PT    Time 4    Period Weeks    Status Achieved    Target Date 12/10/20      PT SHORT TERM GOAL #2   Title Patient will be able to perform sit<>stand and bed mobility without assist to improve independence    Time 4    Period Weeks    Status Achieved    Target Date 12/10/20      PT SHORT TERM GOAL #3   Title Patient will be able to ambulate household distances with LRAD without limitation    Time 4    Period Weeks    Status Achieved    Target Date 12/10/20      PT SHORT TERM GOAL #4   Title PT will take FOTO assessment on 2nd visit and review with patient by 3rd visit    Time 3    Period Weeks    Status Achieved    Target Date 12/03/20             PT Long Term Goals - 11/12/20 1319      PT LONG TERM GOAL #1   Title Patient will be I with final HEP to maintain progress from  PT    Time 8    Period Weeks    Status New    Target Date 01/07/21      PT LONG TERM GOAL #2   Title Patient will be able to ambulate community level distances with LRAD to improve shopping ability    Time 8    Period Weeks    Status New    Target Date 01/07/21      PT LONG TERM  GOAL #3   Title Patient will exhibit left hip strength grossly >/= 4/5 MMT to negoitate stairs without limitation    Time 8    Period Weeks    Status New    Target Date 01/07/21      PT LONG TERM GOAL #4   Title Patient will demonstrate left hip motion grossly WFL to improve dressing ability    Time 8    Period Weeks    Status New    Target Date 01/07/21      PT LONG TERM GOAL #5   Title Establish FOTO goal at next visit    Time 8    Period Weeks    Status New    Target Date 01/07/21                 Plan - 12/14/20 0926    Clinical Impression Statement Patient tolerated therapy well with no adverse effects. Patient is progressing well with hip/core strengthening exercises and was able to progress standing exercises with good tolerance. She exhibits appropriate range of motion of the left hip and continues to demonstrate weakness of the left hip. Patient would benefit from continued skilled PT to progress her strength and mobility in order to return to walking without limitation and maximize functional ability.    PT Treatment/Interventions ADLs/Self Care Home Management;Iontophoresis 4mg /ml Dexamethasone;Moist Heat;Traction;Ultrasound;Cryotherapy;Electrical Stimulation;Therapeutic exercise;Patient/family education;Manual techniques;Dry needling;Therapeutic activities;Neuromuscular re-education;Taping;Aquatic Therapy;Balance training;Functional mobility training;Stair training;Gait training;Passive range of motion;Joint Manipulations    PT Next Visit Plan Review HEP and progress PRN, balance training, progress strengthening as tolerated    PT Home Exercise Plan [email or web address], [email or web address] (PRN)    Consulted and Agree with Plan of Care Patient           Patient will benefit from skilled therapeutic intervention in order to improve the following deficits and impairments:  Abnormal gait,Decreased range of motion,Difficulty walking,Decreased activity tolerance,Pain,Decreased  balance,Decreased strength,Postural dysfunction  Visit Diagnosis: Pain in left hip  Stiffness of left hip, not elsewhere classified  Muscle weakness (generalized)  Other abnormalities of gait and mobility     Problem List Patient Active Problem List   Diagnosis Date Noted  . S/P hip replacement, left 11/10/2020  . Primary localized osteoarthrosis of right shoulder 03/21/2017  . S/P shoulder replacement 02/19/2016  . Genetic testing 12/11/2015  . primary Osteoarthritis of left knee 01/23/2015  . Osteoarthritis of right knee 08/15/2014  . Primary cancer of lower outer quadrant of left female breast ([place]) 01/23/2012  . HYPERGLYCEMIA 05/03/2007  . HYPERPLASIA, ENDOMETRIAL NOS 02/22/2007  . DYSMENORRHEA 10/18/2006  . DEGENERATIVE JOINT DISEASE 10/18/2006  . INSOMNIA 10/18/2006  . CHEST PAIN 10/18/2006  . OBESITY 10/05/2006  . ANXIETY 10/05/2006  . HYPERTENSION 10/05/2006  . GASTROENTERITIS 10/05/2006  . MENORRHAGIA, PERIMENOPAUSAL 10/05/2006    Hilda Blades, PT, DPT, LAT, ATC 12/14/20  9:56 AM Phone: [phone] Fax: [place] [facility] [address] [place], [place], [address] Phone: [phone]   Fax:  [phone]  Name: Morgan Knee  Taylor MRN: [id number] Date of Birth: [birth year]

## 2020-12-16 ENCOUNTER — Encounter: Payer: Medicare Other | Admitting: Physical Therapy

## 2020-12-21 ENCOUNTER — Other Ambulatory Visit: Payer: Self-pay

## 2020-12-21 ENCOUNTER — Encounter: Payer: Self-pay | Admitting: Physical Therapy

## 2020-12-21 ENCOUNTER — Ambulatory Visit: Payer: BC Managed Care – PPO | Admitting: Physical Therapy

## 2020-12-21 DIAGNOSIS — R2689 Other abnormalities of gait and mobility: Secondary | ICD-10-CM

## 2020-12-21 DIAGNOSIS — M25652 Stiffness of left hip, not elsewhere classified: Secondary | ICD-10-CM

## 2020-12-21 DIAGNOSIS — M25552 Pain in left hip: Secondary | ICD-10-CM

## 2020-12-21 DIAGNOSIS — M6281 Muscle weakness (generalized): Secondary | ICD-10-CM

## 2020-12-21 NOTE — Patient Instructions (Signed)
Access Code: [email or web address] URL: [email or web address] Date: 12/21/2020 Prepared by: Rosana Hoes  Exercises Active Straight Leg Raise with Quad Set - 1 x daily - 7 x weekly - 3 sets - 10 reps Bridge - 1-2 x daily - 7 x weekly - 2 sets - 10 reps Seated Knee Extension with Anchored Resistance - 1-2 x daily - 7 x weekly - 2 sets - 15 reps Seated Knee Flexion with Anchored Resistance - 1-2 x daily - 7 x weekly - 2 sets - 15 reps Heel Toe Raises with Counter Support - 1-2 x daily - 7 x weekly - 2 sets - 10 reps Standing Hip Abduction with Resistance at Ankles and Counter Support - 1 x daily - 7 x weekly - 2 sets - 15 reps Standing Hip Extension with Resistance at Ankles and Counter Support - 1 x daily - 7 x weekly - 2 sets - 15 reps Standing Marching - 1 x daily - 7 x weekly - 2 sets - 10 reps Sit to Stand with Arms Crossed - 1 x daily - 7 x weekly - 2 sets - 10 reps Standing Single Leg Stance with Counter Support - 1 x daily - 7 x weekly - 3 sets - 30 seconds hold

## 2020-12-21 NOTE — Therapy (Signed)
[place], [place], [address] Phone: [phone]   Fax:  [phone]  Physical Therapy Treatment  Patient Details  Name: Morgan Taylor MRN: [id number] Date of Birth: 21-Mar-1961 Referring Provider (PT): Marchia Bond, MD   Encounter Date: 12/21/2020   PT End of Session - 12/21/20 1139    Visit Number 8    Number of Visits 17    Date for PT Re-Evaluation 01/07/21    Authorization Type UHC MCR    Authorization Time Period FOTO 10th visit (initial completed on visit 4)    Progress Note Due on Visit 10    PT Start Time 1130    PT Stop Time 1210    PT Time Calculation (min) 40 min    Activity Tolerance Patient tolerated treatment well    Behavior During Therapy [facility] for tasks assessed/performed           Past Medical History:  Diagnosis Date   Anxiety    Arthritis    Breast cancer ([place])    Cancer ([place]) 2009   lumpectomy   Depression    takes Remeron nightly   Diabetes mellitus    takes Lantus and Victoza. Average fasting blood sugar runs 89   GERD (gastroesophageal reflux disease)    takes Omeprazole daily   Hyperlipemia    takes Lipitor daily   Hypertension    takes Amlodipine and Losartan daily   Hypothyroidism    takes Synthroid daily   Neck pain    Numbness    in both hands   Personal history of radiation therapy    Primary localized osteoarthrosis of right shoulder 03/21/2017    Past Surgical History:  Procedure Laterality Date   APPENDECTOMY     BREAST BIOPSY     BREAST LUMPECTOMY Left 2009   CESAREAN SECTION     COLONOSCOPY     DILATION AND CURETTAGE OF UTERUS     SHOULDER ARTHROSCOPY Left    cleaned out joint   TOTAL HIP ARTHROPLASTY Left 11/10/2020   Procedure: TOTAL HIP ARTHROPLASTY;  Surgeon: Marchia Bond, MD;  [facility];  Service: Orthopedics;  Laterality: Left;   TOTAL KNEE ARTHROPLASTY Right 08/15/2014   Procedure: RIGHT TOTAL KNEE  ARTHROPLASTY;  Surgeon: Yvette Rack., MD;  Location: [place];  Service: Orthopedics;  Laterality: Right;   TOTAL KNEE ARTHROPLASTY Left 01/23/2015   Procedure: TOTAL KNEE ARTHROPLASTY;  Surgeon: Yvette Rack., MD;  Location: [place];  Service: Orthopedics;  Laterality: Left;   TOTAL SHOULDER ARTHROPLASTY Left 02/19/2016   Procedure: LEFT TOTAL SHOULDER ARTHROPLASTY;  Surgeon: Marchia Bond, MD;  Location: [place];  Service: Orthopedics;  Laterality: Left;   TOTAL SHOULDER ARTHROPLASTY Right 03/21/2017   Procedure: TOTAL SHOULDER ARTHROPLASTY;  Surgeon: Marchia Bond, MD;  Location: [place];  Service: Orthopedics;  Laterality: Right;    There were no vitals filed for this visit.   Subjective Assessment - 12/21/20 1137    Subjective Patient reports she is doing well. Nothing new going on.    Patient Stated Goals Get back to walking without device, lighter duty job    Currently in Pain? Yes    Pain Score 3     Pain Location Hip    Pain Orientation Left    Pain Descriptors / Indicators Aching;Sore    Pain Type Surgical pain    Pain Onset More than a month ago    Pain Frequency Constant  [facility] PT Assessment - 12/21/20 0001      Strength   Left Hip Flexion 4/5    Left Hip Extension 4-/5    Left Hip ABduction 4-/5                         [facility] Adult PT Treatment/Exercise - 12/21/20 0001      Exercises   Exercises Knee/Hip      Knee/Hip Exercises: Standing   Heel Raises 2 sets;15 reps    Hip Flexion 10 reps    Hip Flexion Limitations marching    Hip Abduction 10 reps    Abduction Limitations yellow band at ankles    Hip Extension 10 reps    Extension Limitations yellow band at ankles    Lateral Step Up 2 sets;10 reps    Lateral Step Up Limitations 6" step height    Forward Step Up 2 sets;10 reps    Forward Step Up Limitations 6" step height    SLS 3 x 30 sec each      Knee/Hip Exercises: Seated   Sit to Sand 2 sets;10 reps      Knee/Hip  Exercises: Supine   Bridges with Ball Squeeze 2 sets;15 reps    Straight Leg Raises 2 sets;10 reps      Knee/Hip Exercises: Sidelying   Hip ABduction 2 sets;10 reps    Hip ABduction Limitations left only                  PT Education - 12/21/20 1139    Education Details HEP progression    Person(s) Educated Patient    Methods Explanation;Handout;Demonstration;Verbal cues    Comprehension Verbalized understanding;Need further instruction;Returned demonstration;Verbal cues required            PT Short Term Goals - 12/09/20 1152      PT SHORT TERM GOAL #1   Title Patient will be I with initial HEP to progress with PT    Time 4    Period Weeks    Status Achieved    Target Date 12/10/20      PT SHORT TERM GOAL #2   Title Patient will be able to perform sit<>stand and bed mobility without assist to improve independence    Time 4    Period Weeks    Status Achieved    Target Date 12/10/20      PT SHORT TERM GOAL #3   Title Patient will be able to ambulate household distances with LRAD without limitation    Time 4    Period Weeks    Status Achieved    Target Date 12/10/20      PT SHORT TERM GOAL #4   Title PT will take FOTO assessment on 2nd visit and review with patient by 3rd visit    Time 3    Period Weeks    Status Achieved    Target Date 12/03/20             PT Long Term Goals - 11/12/20 1319      PT LONG TERM GOAL #1   Title Patient will be I with final HEP to maintain progress from PT    Time 8    Period Weeks    Status New    Target Date 01/07/21      PT LONG TERM GOAL #2   Title Patient will be able to ambulate community level distances with LRAD to improve shopping ability  Time 8    Period Weeks    Status New    Target Date 01/07/21      PT LONG TERM GOAL #3   Title Patient will exhibit left hip strength grossly >/= 4/5 MMT to negoitate stairs without limitation    Time 8    Period Weeks    Status New    Target Date 01/07/21       PT LONG TERM GOAL #4   Title Patient will demonstrate left hip motion grossly WFL to improve dressing ability    Time 8    Period Weeks    Status New    Target Date 01/07/21      PT LONG TERM GOAL #5   Title Establish FOTO goal at next visit    Time 8    Period Weeks    Status New    Target Date 01/07/21                 Plan - 12/21/20 1141    Clinical Impression Statement Patient tolerated therapy well with no adverse effects. Continued focus on hip strengthening with good tolerance. Patient does not exhibit any significant gait deviations and is progressing well with all strengthening and balance training with only minimal increase in discomfort that resolves quickly with rest. Patient would benefit from continued skilled PT to progress her strength and mobility in order to return to walking without limitation and maximize functional ability.    PT Treatment/Interventions ADLs/Self Care Home Management;Iontophoresis 4mg /ml Dexamethasone;Moist Heat;Traction;Ultrasound;Cryotherapy;Electrical Stimulation;Therapeutic exercise;Patient/family education;Manual techniques;Dry needling;Therapeutic activities;Neuromuscular re-education;Taping;Aquatic Therapy;Balance training;Functional mobility training;Stair training;Gait training;Passive range of motion;Joint Manipulations    PT Next Visit Plan Review HEP and progress PRN, balance training, progress strengthening as tolerated    PT Home Exercise Plan [email or web address], [email or web address] (PRN)    Consulted and Agree with Plan of Care Patient           Patient will benefit from skilled therapeutic intervention in order to improve the following deficits and impairments:  Abnormal gait,Decreased range of motion,Difficulty walking,Decreased activity tolerance,Pain,Decreased balance,Decreased strength,Postural dysfunction  Visit Diagnosis: Pain in left hip  Stiffness of left hip, not elsewhere classified  Muscle weakness (generalized)  Other  abnormalities of gait and mobility     Problem List Patient Active Problem List   Diagnosis Date Noted   S/P hip replacement, left 11/10/2020   Primary localized osteoarthrosis of right shoulder 03/21/2017   S/P shoulder replacement 02/19/2016   Genetic testing 12/11/2015   primary Osteoarthritis of left knee 01/23/2015   Osteoarthritis of right knee 08/15/2014   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 [place], [place], [place] Phone: [phone]   Fax:  [phone]  Name: ALIZ MERITT MRN: Tristan Schroeder Date of Birth: 28-Jan-1961

## 2020-12-23 ENCOUNTER — Ambulatory Visit: Payer: BC Managed Care – PPO | Admitting: Physical Therapy

## 2020-12-23 ENCOUNTER — Encounter: Payer: Self-pay | Admitting: Physical Therapy

## 2020-12-23 ENCOUNTER — Other Ambulatory Visit: Payer: Self-pay

## 2020-12-23 DIAGNOSIS — R2689 Other abnormalities of gait and mobility: Secondary | ICD-10-CM

## 2020-12-23 DIAGNOSIS — M6281 Muscle weakness (generalized): Secondary | ICD-10-CM

## 2020-12-23 DIAGNOSIS — M25552 Pain in left hip: Secondary | ICD-10-CM

## 2020-12-23 DIAGNOSIS — M25652 Stiffness of left hip, not elsewhere classified: Secondary | ICD-10-CM

## 2020-12-23 NOTE — Therapy (Signed)
[place], [place], [address] Phone: [phone]   Fax:  [phone]  Physical Therapy Treatment / Discharge  Patient Details  Name: Morgan Taylor MRN: [id number] Date of Birth: 1961-09-02 Referring Provider (PT): Marchia Bond, MD   Encounter Date: 12/23/2020   PT End of Session - 12/23/20 1135    Visit Number 9    Number of Visits 17    Date for PT Re-Evaluation 01/07/21    Authorization Type [employer] MCR    Progress Note Due on Visit 10    PT Start Time 1133    PT Stop Time 1211    PT Time Calculation (min) 38 min    Activity Tolerance Patient tolerated treatment well    Behavior During Therapy [facility] for tasks assessed/performed           Past Medical History:  Diagnosis Date  . Anxiety   . Arthritis   . Breast cancer ([place])   . Cancer [facility]) 2009   lumpectomy  . Depression    takes Remeron nightly  . Diabetes mellitus    takes Lantus and Victoza. Average fasting blood sugar runs 89  . GERD (gastroesophageal reflux disease)    takes Omeprazole daily  . Hyperlipemia    takes Lipitor daily  . Hypertension    takes Amlodipine and Losartan daily  . Hypothyroidism    takes Synthroid daily  . Neck pain   . Numbness    in both hands  . Personal history of radiation therapy   . Primary localized osteoarthrosis of right shoulder 03/21/2017    Past Surgical History:  Procedure Laterality Date  . APPENDECTOMY    . BREAST BIOPSY    . BREAST LUMPECTOMY Left 2009  . CESAREAN SECTION    . COLONOSCOPY    . DILATION AND CURETTAGE OF UTERUS    . SHOULDER ARTHROSCOPY Left    cleaned out joint  . TOTAL HIP ARTHROPLASTY Left 11/10/2020   Procedure: TOTAL HIP ARTHROPLASTY;  Surgeon: Marchia Bond, MD;  Location: [facility];  Service: Orthopedics;  Laterality: Left;  . TOTAL KNEE ARTHROPLASTY Right 08/15/2014   Procedure: RIGHT TOTAL KNEE ARTHROPLASTY;  Surgeon: Yvette Rack., MD;  Location: [place];   Service: Orthopedics;  Laterality: Right;  . TOTAL KNEE ARTHROPLASTY Left 01/23/2015   Procedure: TOTAL KNEE ARTHROPLASTY;  Surgeon: Yvette Rack., MD;  Location: [place];  Service: Orthopedics;  Laterality: Left;  . TOTAL SHOULDER ARTHROPLASTY Left 02/19/2016   Procedure: LEFT TOTAL SHOULDER ARTHROPLASTY;  Surgeon: Marchia Bond, MD;  Location: [place];  Service: Orthopedics;  Laterality: Left;  . TOTAL SHOULDER ARTHROPLASTY Right 03/21/2017   Procedure: TOTAL SHOULDER ARTHROPLASTY;  Surgeon: Marchia Bond, MD;  Location: [employer];  Service: Orthopedics;  Laterality: Right;    There were no vitals filed for this visit.   Subjective Assessment - 12/23/20 1133    Subjective Patient reports she continues to do well, occasional pain but does not feel limited. She feels she is ready to discharge from PT.    How long can you sit comfortably? No limitation    How long can you stand comfortably? 15 minutes    How long can you walk comfortably? 15 minutes, able to continue walking    Patient Stated Goals Get back to walking without device, lighter duty job    Currently in Pain? No/denies              [facility] PT Assessment -  12/23/20 0001      Assessment   Medical Diagnosis Post-op left total hip replacement - posterior approach    Referring Provider (PT) Marchia Bond, MD    Onset Date/Surgical Date 11/10/20      Precautions   Precautions None      Restrictions   Weight Bearing Restrictions No      Balance Screen   Has the patient fallen in the past 6 months No    Has the patient had a decrease in activity level because of a fear of falling?  No    Is the patient reluctant to leave their home because of a fear of falling?  No      Prior Function   Level of Independence Independent      Cognition   Overall Cognitive Status Within Functional Limits for tasks assessed      Observation/Other Assessments   Observations Patient appears in no apparent distress    Focus on Therapeutic  Outcomes (FOTO)  62% functional status   predicted 67% functional status     AROM   Overall AROM Comments AROM left hip grossly WFL and non-painful      Strength   Left Hip Flexion 4/5    Left Hip Extension 4/5    Left Hip ABduction 4/5      Transfers   Transfers Independent with all Transfers      Ambulation/Gait   Gait Pattern Within Functional Limits                         [facility] Adult PT Treatment/Exercise - 12/23/20 0001      Self-Care   Self-Care Other Self-Care Comments    Other Self-Care Comments  FOTO, POC discharge      Exercises   Exercises Knee/Hip   reviewed and demonstrated current HEP, patient provided additional bands to progress resistance at home                 PT Education - 12/23/20 1134    Education Details POC discharge, HEP conisstency    Person(s) Educated Patient    Methods Explanation    Comprehension Verbalized understanding            PT Short Term Goals - 12/09/20 1152      PT SHORT TERM GOAL #1   Title Patient will be I with initial HEP to progress with PT    Time 4    Period Weeks    Status Achieved    Target Date 12/10/20      PT SHORT TERM GOAL #2   Title Patient will be able to perform sit<>stand and bed mobility without assist to improve independence    Time 4    Period Weeks    Status Achieved    Target Date 12/10/20      PT SHORT TERM GOAL #3   Title Patient will be able to ambulate household distances with LRAD without limitation    Time 4    Period Weeks    Status Achieved    Target Date 12/10/20      PT SHORT TERM GOAL #4   Title PT will take FOTO assessment on 2nd visit and review with patient by 3rd visit    Time 3    Period Weeks    Status Achieved    Target Date 12/03/20             PT Long Term Goals -  12/23/20 1137      PT LONG TERM GOAL #1   Title Patient will be I with final HEP to maintain progress from PT    Time 8    Period Weeks    Status Achieved      PT LONG  TERM GOAL #2   Title Patient will be able to ambulate community level distances with LRAD to improve shopping ability    Time 8    Period Weeks    Status Achieved      PT LONG TERM GOAL #3   Title Patient will exhibit left hip strength grossly >/= 4/5 MMT to negoitate stairs without limitation    Time 8    Period Weeks    Status Achieved      PT LONG TERM GOAL #4   Title Patient will demonstrate left hip motion grossly WFL to improve dressing ability    Time 8    Period Weeks    Status Achieved      PT LONG TERM GOAL #5   Title FOTO goal    Baseline Patient exhibited improvement but did not achieve FOTO goal    Time 8    Period Weeks    Status Partially Met                 Plan - 12/23/20 1136    Clinical Impression Statement Patient tolerated therapy well with no adverse effects. She has achieved all established goals except functional status on FOTO but patient reports no real limitations with activity level. Patient will be officially discharged from PT and was instructed on follow-up instructions.    PT Treatment/Interventions ADLs/Self Care Home Management;Iontophoresis 57m/ml Dexamethasone;Moist Heat;Traction;Ultrasound;Cryotherapy;Electrical Stimulation;Therapeutic exercise;Patient/family education;Manual techniques;Dry needling;Therapeutic activities;Neuromuscular re-education;Taping;Aquatic Therapy;Balance training;Functional mobility training;Stair training;Gait training;Passive range of motion;Joint Manipulations    PT Next Visit Plan NA - discharge    PT Home Exercise Plan [email or web address], [id number] (PRN)    Consulted and Agree with Plan of Care Patient           Patient will benefit from skilled therapeutic intervention in order to improve the following deficits and impairments:  Abnormal gait,Decreased range of motion,Difficulty walking,Decreased activity tolerance,Pain,Decreased balance,Decreased strength,Postural dysfunction  Visit Diagnosis: Pain in left  hip  Stiffness of left hip, not elsewhere classified  Muscle weakness (generalized)  Other abnormalities of gait and mobility     Problem List Patient Active Problem List   Diagnosis Date Noted  . S/P hip replacement, left 11/10/2020  . Primary localized osteoarthrosis of right shoulder 03/21/2017  . S/P shoulder replacement 02/19/2016  . Genetic testing 12/11/2015  . primary Osteoarthritis of left knee 01/23/2015  . Osteoarthritis of right knee 08/15/2014  . 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 HYPERGLYCEMIA 05/03/2007  . HYPERPLASIA, ENDOMETRIAL NOS 02/22/2007  . DYSMENORRHEA 10/18/2006  . DEGENERATIVE JOINT DISEASE 10/18/2006  . INSOMNIA 10/18/2006  . CHEST PAIN 10/18/2006  . OBESITY 10/05/2006  . ANXIETY 10/05/2006  . HYPERTENSION 10/05/2006  . GASTROENTERITIS 10/05/2006  . MENORRHAGIA, PERIMENOPAUSAL 10/05/2006    CHilda Blades PT, DPT, LAT, ATC 12/23/20  12:12 PM Phone: [phone]: 3[facility] [address]Phone: [phone]  Fax:  [phone] Name: Morgan SUPANMRN: [id number] of Birth: [birth year]  PHYSICAL THERAPY DISCHARGE SUMMARY  Visits from Start of Care: 9  Current functional level related to goals / functional outcomes: See  above   Remaining deficits: See above   Education / Equipment: HEP Plan: Patient agrees to discharge.  Patient goals were partially met. Patient is being discharged due to being pleased with the current functional level.  ?????

## 2022-01-05 ENCOUNTER — Other Ambulatory Visit: Payer: Self-pay | Admitting: Physician Assistant

## 2022-01-05 DIAGNOSIS — Z1231 Encounter for screening mammogram for malignant neoplasm of breast: Secondary | ICD-10-CM

## 2022-01-20 ENCOUNTER — Ambulatory Visit: Payer: Medicare Other

## 2022-01-31 ENCOUNTER — Ambulatory Visit: Payer: Medicare Other

## 2022-02-04 ENCOUNTER — Other Ambulatory Visit: Payer: Self-pay

## 2022-02-04 ENCOUNTER — Ambulatory Visit
Admission: RE | Admit: 2022-02-04 | Discharge: 2022-02-04 | Disposition: A | Discharge status: Home / Self Care | Payer: BC Managed Care – PPO | Source: Ambulatory Visit | Attending: Physician Assistant | Admitting: Physician Assistant

## 2022-02-04 DIAGNOSIS — Z1231 Encounter for screening mammogram for malignant neoplasm of breast: Secondary | ICD-10-CM

## 2022-02-13 ENCOUNTER — Emergency Department (HOSPITAL_COMMUNITY): Payer: BC Managed Care – PPO

## 2022-02-13 ENCOUNTER — Encounter (HOSPITAL_COMMUNITY): Payer: Self-pay

## 2022-02-13 ENCOUNTER — Emergency Department (HOSPITAL_COMMUNITY)
Admission: EM | Admit: 2022-02-13 | Discharge: 2022-02-13 | Disposition: A | Discharge status: Home / Self Care | Payer: BC Managed Care – PPO | Attending: Emergency Medicine | Admitting: Emergency Medicine

## 2022-02-13 ENCOUNTER — Other Ambulatory Visit: Payer: Self-pay

## 2022-02-13 DIAGNOSIS — S61551A Open bite of right wrist, initial encounter: Secondary | ICD-10-CM | POA: Diagnosis not present

## 2022-02-13 DIAGNOSIS — Z23 Encounter for immunization: Secondary | ICD-10-CM | POA: Diagnosis not present

## 2022-02-13 DIAGNOSIS — W540XXA Bitten by dog, initial encounter: Secondary | ICD-10-CM | POA: Diagnosis not present

## 2022-02-13 DIAGNOSIS — S60211A Contusion of right wrist, initial encounter: Secondary | ICD-10-CM

## 2022-02-13 MED ORDER — TETANUS-DIPHTH-ACELL PERTUSSIS 5-2.5-18.5 LF-MCG/0.5 IM SUSY
0.5000 mL | PREFILLED_SYRINGE | Freq: Once | INTRAMUSCULAR | Status: AC
  Administered 2022-02-13: 0.5 mL via INTRAMUSCULAR
  Filled 2022-02-13: qty 0.5

## 2022-02-13 MED ORDER — AMOXICILLIN-POT CLAVULANATE 875-125 MG PO TABS
1.0000 | ORAL_TABLET | Freq: Once | ORAL | Status: AC
Start: 2022-02-13 — End: 2022-02-13
  Administered 2022-02-13: 1 via ORAL
  Filled 2022-02-13: qty 1

## 2022-02-13 MED ORDER — OXYCODONE-ACETAMINOPHEN 5-325 MG PO TABS
1.0000 | ORAL_TABLET | Freq: Once | ORAL | Status: AC
  Administered 2022-02-13: 1 via ORAL
  Filled 2022-02-13: qty 1

## 2022-02-13 MED ORDER — AMOXICILLIN-POT CLAVULANATE 875-125 MG PO TABS: 1.0000 | ORAL_TABLET | Freq: Two times a day (BID) | ORAL | 0 refills | Status: DC

## 2022-02-13 NOTE — ED Provider Notes (Signed)
[facility] DEPT Provider Note   CSN: [id number] Arrival date & time: 02/13/22  2154     History  Chief Complaint  Patient presents with   Animal Bite    Morgan Taylor is a 61 y.o. female.  HPI She presents for evaluation of injuries that occurred when she was trying to break her dogs up who were fighting.  She feels that she may have gotten struck on the right wrist with a broom stick as a family member was trying to dislodge the larger dog from the smaller dog.  She complains of injuries only to the right forearm, right hand and fingers.  She cannot recall her last tetanus booster.    Home Medications Prior to Admission medications   Medication Sig Start Date End Date Taking? Authorizing Provider  amLODipine (NORVASC) 10 MG tablet Take 10 mg by mouth daily.    Yes [provider]  amoxicillin-clavulanate (AUGMENTIN) 875-125 MG tablet Take 1 tablet by mouth 2 (two) times daily. One po bid x 7 days 02/13/22  Yes Daleen Bo, MD  atorvastatin (LIPITOR) 20 MG tablet Take 20 mg by mouth daily.    Yes [provider]  Biotin 10000 MCG TABS Take 10,000 mcg by mouth daily.   Yes [provider]  Cholecalciferol (VITAMIN D) 125 MCG (5000 UT) CAPS Take 5,000 Units by mouth daily.   Yes [provider]  diclofenac (VOLTAREN) 75 MG EC tablet Take 75 mg by mouth 2 (two) times daily. 02/04/22  Yes [provider]  INVOKANA 300 MG TABS tablet Take 300 mg by mouth every morning. 01/10/22  Yes [provider]  levothyroxine (SYNTHROID, LEVOTHROID) 75 MCG tablet Take 75 mcg by mouth daily before breakfast.  01/05/16  Yes [provider]  liraglutide (VICTOZA) 18 MG/3ML SOPN Inject 1.8 mg into the skin daily.   Yes [provider]  losartan (COZAAR) 100 MG tablet Take 100 mg by mouth daily.    Yes [provider]  Omeprazole 20 MG TBEC Take 20 mg by mouth daily.   Yes [provider]   oxyCODONE-acetaminophen (PERCOCET) 10-325 MG tablet Take 1 tablet by mouth 5 (five) times daily as needed for pain. 01/12/22  Yes [provider]  pioglitazone (ACTOS) 30 MG tablet Take 30 mg by mouth daily.   Yes [provider]  pregabalin (LYRICA) 300 MG capsule Take 300 mg by mouth 2 (two) times daily. 02/07/22  Yes [provider]  sertraline (ZOLOFT) 50 MG tablet Take 50 mg by mouth daily. 02/06/22  Yes [provider]  tiZANidine (ZANAFLEX) 4 MG tablet Take 4 mg by mouth daily as needed for muscle spasms. 02/04/22  Yes [provider]  naloxone (NARCAN) nasal spray 4 mg/0.1 mL Place 1 spray into the nose Once PRN. Patient not taking: Reported on 02/13/2022 02/04/22   [provider]  sennosides-docusate sodium (SENOKOT-S) 8.6-50 MG tablet Take 2 tablets by mouth daily. Patient not taking: Reported on 02/13/2022 11/10/20   Ventura Bruns, PA-C  valACYclovir (VALTREX) 500 MG tablet Take 500 mg by mouth daily. Patient not taking: Reported on 02/13/2022    [provider]      Allergies    Lyrica [pregabalin], Sulfonamide derivatives, and Vortioxetine    Review of Systems   Review of Systems  Physical Exam Updated Vital Signs BP 132/72 (BP Location: Right Arm)    Pulse 69    Temp 98.5 F (36.9 C) (Oral)  Resp 18    Ht 5\' 2"  (1.575 m)    Wt 79.4 kg    SpO2 99%    BMI 32.01 kg/m  Physical Exam Vitals and nursing note reviewed.  Constitutional:      General: She is in acute distress (She is uncomfortable).     Appearance: She is well-developed. She is obese. She is not ill-appearing.  HENT:     Head: Normocephalic and atraumatic.     Right Ear: External ear normal.     Left Ear: External ear normal.  Eyes:     Conjunctiva/sclera: Conjunctivae normal.     Pupils: Pupils are equal, round, and reactive to light.  Neck:     Trachea: Phonation normal.  Cardiovascular:     Rate and Rhythm: Normal rate.  Pulmonary:      Effort: Pulmonary effort is normal.  Abdominal:     General: There is no distension.  Musculoskeletal:        General: Normal range of motion.     Cervical back: Normal range of motion and neck supple.     Comments: Right dorsal wrist with 2 contusions over the dorsum of the distal radius region.  No skin break at the site.  Scattered punctures of the left mid volar forearm, dorsal base of the thumb, tip of the index finger and side of the fifth finger.  These are appearing to be superficial, not bleeding and without evidence for foreign body.  She has reasonable range of motion of the right hand and wrist without significant limitation or deformity.  Skin:    General: Skin is warm and dry.  Neurological:     Mental Status: She is alert and oriented to person, place, and time.     Cranial Nerves: No cranial nerve deficit.     Sensory: No sensory deficit.     Motor: No abnormal muscle tone.     Coordination: Coordination normal.  Psychiatric:        Mood and Affect: Mood normal.        Behavior: Behavior normal.        Thought Content: Thought content normal.        Judgment: Judgment normal.    ED Results / Procedures / Treatments   Labs (all labs ordered are listed, but only abnormal results are displayed) Labs Reviewed - No data to display  EKG None  Radiology DG Wrist Complete Right  Result Date: 02/13/2022 CLINICAL DATA:  Injury while breaking up a dog attack. Pain and swelling at wrist and distal forearm. EXAM: RIGHT WRIST - COMPLETE 3+ VIEW COMPARISON:  None. FINDINGS: There is no evidence of fracture or dislocation. There is no evidence of arthropathy or other focal bone abnormality. No radiopaque foreign body is identified. Soft tissue swelling is noted about the distal forearm, wrist, and hand. IMPRESSION: Soft tissue swelling about the distal forearm, wrist, and hand. No acute osseous abnormality or radiopaque foreign body. Electronically Signed   By: Brett Fairy M.D.    On: 02/13/2022 22:26    Procedures Procedures    Medications Ordered in ED Medications  oxyCODONE-acetaminophen (PERCOCET/ROXICET) 5-325 MG per tablet 1 tablet (1 tablet Oral Given 02/13/22 2311)  amoxicillin-clavulanate (AUGMENTIN) 875-125 MG per tablet 1 tablet (1 tablet Oral Given 02/13/22 2312)  Tdap (BOOSTRIX) injection 0.5 mL (0.5 mLs Intramuscular Given 02/13/22 2312)    ED Course/ Medical Decision Making/ A&P  Medical Decision Making Injuries from her dog as she was trying to break up a fight with another one of her dogs.  Problems Addressed: Contusion of right wrist, initial encounter: acute illness or injury    Details: Likely secondary to being hit with a broom stick as the dogs were fighting. Dog bite, initial encounter: acute illness or injury    Details: Multiple small punctures, requiring laceration repair  Amount and/or Complexity of Data Reviewed Independent Historian:     Details: She is lucid, and gives a cogent history Radiology: ordered.  Risk Prescription drug management. Decision regarding hospitalization. Risk Details: Patient's wounds are superficial and do not require closure or irrigation at this time.  No evidence for fracture on imaging.  Doubt retained foreign body in the wounds.  These wounds can be managed expectantly with symptomatic treatment.  She does not require hospitalization for this management.  Prophylactic antibiotic ordered.           Final Clinical Impression(s) / ED Diagnoses Final diagnoses:  Dog bite, initial encounter  Contusion of right wrist, initial encounter    Rx / DC Orders ED Discharge Orders          Ordered    amoxicillin-clavulanate (AUGMENTIN) 875-125 MG tablet  2 times daily        02/13/22 2319              Daleen Bo, MD 02/14/22 1610

## 2022-02-13 NOTE — ED Triage Notes (Signed)
Pt reports with multiple dog bites to her right hand and arm. Pt states that she was breaking up her dogs fighting and was bitten. Pt states that someone had a broom and may have hit her on accident. Pts right wrist is swollen and she cant bend it.

## 2022-02-13 NOTE — ED Notes (Signed)
Pt given ice pack

## 2022-02-13 NOTE — Discharge Instructions (Addendum)
To help the wounds heal, clean them with soap and water 2-3 times a day and apply a movement such as Neosporin.  Use ice on sore spots 3-4 times a day for 30 minutes.  Start the antibiotic prescription tomorrow morning.  Do not drive or drink alcohol when taking the narcotic pain reliever.  Follow-up with your doctor for checkup in 3 to 5 days.  Return here if needed for problems.

## 2022-02-24 ENCOUNTER — Other Ambulatory Visit: Payer: Self-pay | Admitting: Physician Assistant

## 2022-02-24 DIAGNOSIS — N644 Mastodynia: Secondary | ICD-10-CM

## 2022-03-18 ENCOUNTER — Ambulatory Visit
Admission: RE | Admit: 2022-03-18 | Discharge: 2022-03-18 | Disposition: A | Discharge status: Home / Self Care | Payer: Medicare Other | Source: Ambulatory Visit | Attending: Physician Assistant | Admitting: Physician Assistant

## 2022-03-18 ENCOUNTER — Ambulatory Visit: Payer: Medicaid Other

## 2022-03-18 ENCOUNTER — Other Ambulatory Visit: Payer: Self-pay

## 2022-03-18 DIAGNOSIS — N644 Mastodynia: Secondary | ICD-10-CM

## 2022-06-03 ENCOUNTER — Other Ambulatory Visit (HOSPITAL_COMMUNITY): Payer: Self-pay

## 2022-06-03 MED ORDER — OXYCODONE-ACETAMINOPHEN 10-325 MG PO TABS
1.0000 | ORAL_TABLET | Freq: Every day | ORAL | 0 refills | Status: DC | PRN
  Filled 2022-06-03: qty 150, 30d supply, fill #0

## 2022-07-04 ENCOUNTER — Other Ambulatory Visit (HOSPITAL_COMMUNITY): Payer: Self-pay

## 2022-11-28 ENCOUNTER — Other Ambulatory Visit (HOSPITAL_COMMUNITY): Payer: Self-pay

## 2022-11-28 MED ORDER — OXYCODONE-ACETAMINOPHEN 10-325 MG PO TABS
1.0000 | ORAL_TABLET | Freq: Every day | ORAL | 0 refills | Status: DC | PRN
  Filled 2022-11-28: qty 150, 30d supply, fill #0

## 2023-01-26 ENCOUNTER — Other Ambulatory Visit (HOSPITAL_COMMUNITY): Payer: Self-pay

## 2023-01-26 MED ORDER — TIZANIDINE HCL 4 MG PO TABS
4.0000 mg | ORAL_TABLET | Freq: Three times a day (TID) | ORAL | 0 refills | Status: DC | PRN
  Filled 2023-01-26: qty 90, 30d supply, fill #0

## 2023-01-26 MED ORDER — OXYCODONE-ACETAMINOPHEN 10-325 MG PO TABS
1.0000 | ORAL_TABLET | Freq: Every day | ORAL | 0 refills | Status: DC
  Filled 2023-01-26: qty 150, 30d supply, fill #0

## 2023-01-26 MED ORDER — PREGABALIN 300 MG PO CAPS
300.0000 mg | ORAL_CAPSULE | Freq: Two times a day (BID) | ORAL | 0 refills | Status: DC
  Filled 2023-01-26: qty 60, 30d supply, fill #0

## 2023-01-26 MED ORDER — PHENTERMINE HCL 15 MG PO CAPS
15.0000 mg | ORAL_CAPSULE | Freq: Every day | ORAL | 0 refills | Status: DC
  Filled 2023-01-26: qty 30, 30d supply, fill #0

## 2023-01-26 MED ORDER — DICLOFENAC SODIUM 75 MG PO TBEC
75.0000 mg | DELAYED_RELEASE_TABLET | Freq: Every day | ORAL | 0 refills | Status: DC
  Filled 2023-01-26: qty 30, 30d supply, fill #0

## 2023-01-27 ENCOUNTER — Other Ambulatory Visit: Payer: Self-pay

## 2023-02-07 ENCOUNTER — Other Ambulatory Visit (HOSPITAL_COMMUNITY): Payer: Self-pay

## 2023-06-20 ENCOUNTER — Emergency Department (HOSPITAL_COMMUNITY): Payer: BC Managed Care – PPO

## 2023-06-20 ENCOUNTER — Other Ambulatory Visit: Payer: Self-pay

## 2023-06-20 ENCOUNTER — Encounter (HOSPITAL_COMMUNITY): Payer: Self-pay

## 2023-06-20 ENCOUNTER — Emergency Department (HOSPITAL_COMMUNITY)
Admission: EM | Admit: 2023-06-20 | Discharge: 2023-06-20 | Disposition: A | Discharge status: Home / Self Care | Payer: BC Managed Care – PPO | Attending: Emergency Medicine | Admitting: Emergency Medicine

## 2023-06-20 DIAGNOSIS — E1165 Type 2 diabetes mellitus with hyperglycemia: Secondary | ICD-10-CM | POA: Insufficient documentation

## 2023-06-20 DIAGNOSIS — S39012A Strain of muscle, fascia and tendon of lower back, initial encounter: Secondary | ICD-10-CM | POA: Diagnosis not present

## 2023-06-20 DIAGNOSIS — Z79899 Other long term (current) drug therapy: Secondary | ICD-10-CM | POA: Diagnosis not present

## 2023-06-20 DIAGNOSIS — D72829 Elevated white blood cell count, unspecified: Secondary | ICD-10-CM | POA: Insufficient documentation

## 2023-06-20 DIAGNOSIS — R109 Unspecified abdominal pain: Secondary | ICD-10-CM | POA: Insufficient documentation

## 2023-06-20 DIAGNOSIS — E039 Hypothyroidism, unspecified: Secondary | ICD-10-CM | POA: Insufficient documentation

## 2023-06-20 DIAGNOSIS — I10 Essential (primary) hypertension: Secondary | ICD-10-CM | POA: Diagnosis not present

## 2023-06-20 DIAGNOSIS — X58XXXA Exposure to other specified factors, initial encounter: Secondary | ICD-10-CM | POA: Insufficient documentation

## 2023-06-20 DIAGNOSIS — S3992XA Unspecified injury of lower back, initial encounter: Secondary | ICD-10-CM | POA: Diagnosis present

## 2023-06-20 LAB — CBC WITH DIFFERENTIAL/PLATELET
Abs Immature Granulocytes: 0 10*3/uL (ref 0.00–0.07)
Basophils Absolute: 0 10*3/uL (ref 0.0–0.1)
Basophils Relative: 0 %
Eosinophils Absolute: 0.2 10*3/uL (ref 0.0–0.5)
Eosinophils Relative: 2 %
HCT: 42.4 % (ref 36.0–46.0)
Hemoglobin: 13.8 g/dL (ref 12.0–15.0)
Lymphocytes Relative: 21 %
Lymphs Abs: 2.6 10*3/uL (ref 0.7–4.0)
MCH: 30.5 pg (ref 26.0–34.0)
MCHC: 32.5 g/dL (ref 30.0–36.0)
MCV: 93.8 fL (ref 80.0–100.0)
Monocytes Absolute: 0.5 10*3/uL (ref 0.1–1.0)
Monocytes Relative: 4 %
Neutro Abs: 9 10*3/uL — ABNORMAL HIGH (ref 1.7–7.7)
Neutrophils Relative %: 73 %
Platelets: 271 10*3/uL (ref 150–400)
RBC: 4.52 MIL/uL (ref 3.87–5.11)
RDW: 12.9 % (ref 11.5–15.5)
WBC: 12.3 10*3/uL — ABNORMAL HIGH (ref 4.0–10.5)
nRBC: 0 % (ref 0.0–0.2)
nRBC: 0 /100 WBC

## 2023-06-20 LAB — URINALYSIS, ROUTINE W REFLEX MICROSCOPIC
Bilirubin Urine: NEGATIVE
Glucose, UA: >=500 mg/dL — AB
Hgb urine dipstick: NEGATIVE
Ketones, ur: NEGATIVE mg/dL
Nitrite: NEGATIVE
Protein, ur: NEGATIVE mg/dL
Specific Gravity, Urine: 1.018 (ref 1.005–1.030)
pH: 7 (ref 5.0–8.0)

## 2023-06-20 LAB — COMPREHENSIVE METABOLIC PANEL
ALT: 15 U/L (ref 0–44)
AST: 15 U/L (ref 15–41)
Albumin: 3.5 g/dL (ref 3.5–5.0)
Alkaline Phosphatase: 79 U/L (ref 38–126)
Anion gap: 10 (ref 5–15)
BUN: 7 mg/dL (ref 6–20)
CO2: 25 mmol/L (ref 22–32)
Calcium: 8.9 mg/dL (ref 8.9–10.3)
Chloride: 101 mmol/L (ref 98–111)
Creatinine, Ser: 0.6 mg/dL (ref 0.44–1.00)
GFR, Estimated: >60 mL/min (ref >60)
Glucose, Bld: 296 mg/dL — ABNORMAL HIGH (ref 70–99)
Potassium: 3.2 mmol/L — ABNORMAL LOW (ref 3.5–5.1)
Sodium: 136 mmol/L (ref 135–145)
Total Bilirubin: 0.6 mg/dL (ref 0.3–1.2)
Total Protein: 7.2 g/dL (ref 6.5–8.1)

## 2023-06-20 MED ORDER — MORPHINE SULFATE (PF) 4 MG/ML IV SOLN
4.0000 mg | Freq: Once | INTRAVENOUS | Status: AC
  Administered 2023-06-20: 4 mg via INTRAVENOUS
  Filled 2023-06-20: qty 1

## 2023-06-20 MED ORDER — KETOROLAC TROMETHAMINE 30 MG/ML IJ SOLN
30.0000 mg | Freq: Once | INTRAMUSCULAR | Status: AC
  Administered 2023-06-20: 30 mg via INTRAVENOUS
  Filled 2023-06-20: qty 1

## 2023-06-20 MED ORDER — METHOCARBAMOL 500 MG PO TABS: 500.0000 mg | ORAL_TABLET | Freq: Two times a day (BID) | ORAL | 0 refills | Status: DC

## 2023-06-20 MED ORDER — SODIUM CHLORIDE 0.9 % IV BOLUS
1000.0000 mL | Freq: Once | INTRAVENOUS | Status: AC
  Administered 2023-06-20: 1000 mL via INTRAVENOUS

## 2023-06-20 MED ORDER — ONDANSETRON HCL 4 MG/2ML IJ SOLN
4.0000 mg | Freq: Once | INTRAMUSCULAR | Status: AC
  Administered 2023-06-20: 4 mg via INTRAVENOUS
  Filled 2023-06-20: qty 2

## 2023-06-20 MED ORDER — IBUPROFEN 600 MG PO TABS: 600.0000 mg | ORAL_TABLET | Freq: Four times a day (QID) | ORAL | 0 refills | Status: DC | PRN

## 2023-06-20 NOTE — ED Provider Notes (Signed)
[place] [facility] AT [facility] Provider Note   CSN: [id number] Arrival date & time: 06/20/23  1203     History  Chief Complaint  Patient presents with   Back Pain    Morgan Taylor is a 62 y.o. female.  Pt is a 62  yo female with pmhx significant for arthritis, hld, anxiety, gerd, htn, dm, hypothyroidism, depression, breast cancer s/p lumpectomy + radiation in 2009.  Pt presents to the ED today with right flank pain.  Pt said pain started suddenly around 0900 and did not go away.  It is now a 10/10.  No n/v.  No f/c.   No hx kidney stones.       Home Medications Prior to Admission medications   Medication Sig Start Date End Date Taking? Authorizing Provider  ibuprofen (ADVIL) 600 MG tablet Take 1 tablet (600 mg total) by mouth every 6 (six) hours as needed. 06/20/23  Yes Jacalyn Lefevre, MD  methocarbamol (ROBAXIN) 500 MG tablet Take 1 tablet (500 mg total) by mouth 2 (two) times daily. 06/20/23  Yes Jacalyn Lefevre, MD  amLODipine (NORVASC) 10 MG tablet Take 10 mg by mouth daily.     [provider]  amoxicillin-clavulanate (AUGMENTIN) 875-125 MG tablet Take 1 tablet by mouth 2 (two) times daily. One po bid x 7 days 02/13/22   Mancel Bale, MD  atorvastatin (LIPITOR) 20 MG tablet Take 20 mg by mouth daily.     [provider]  Biotin 78295 MCG TABS Take 10,000 mcg by mouth daily.    [provider]  Cholecalciferol (VITAMIN D) 125 MCG (5000 UT) CAPS Take 5,000 Units by mouth daily.    [provider]  diclofenac (VOLTAREN) 75 MG EC tablet Take 75 mg by mouth 2 (two) times daily. 02/04/22   [provider]  diclofenac (VOLTAREN) 75 MG EC tablet Take 1 tablet (75 mg total) by mouth daily. 01/26/23     INVOKANA 300 MG TABS tablet Take 300 mg by mouth every morning. 01/10/22   [provider]  levothyroxine (SYNTHROID, LEVOTHROID) 75 MCG tablet Take 75 mcg by mouth daily before breakfast.  01/05/16   [provider]  liraglutide (VICTOZA) 18 MG/3ML SOPN Inject 1.8 mg into the skin daily.    [provider]  losartan (COZAAR) 100 MG tablet Take 100 mg by mouth daily.     [provider]  naloxone [facility]) nasal spray 4 mg/0.1 mL Place 1 spray into the nose Once PRN. Patient not taking: Reported on 02/13/2022 02/04/22   [provider]  Omeprazole 20 MG TBEC Take 20 mg by mouth daily.    [provider]  oxyCODONE-acetaminophen (PERCOCET) 10-325 MG tablet Take 1 tablet by mouth 5 (five) times daily as needed for pain. 01/12/22   [provider]  oxyCODONE-acetaminophen (PERCOCET) 10-325 MG tablet Take 1 tablet by mouth 5 (five) times daily as needed for pain. 06/03/22     oxyCODONE-acetaminophen (PERCOCET) 10-325 MG tablet Take 1 tablet by mouth 5 (five) times daily as needed for pain 11/28/22     oxyCODONE-acetaminophen (PERCOCET) 10-325 MG tablet Take 1 tablet by mouth 5 (five) times daily as needed for pain 01/26/23     phentermine 15 MG capsule Take 1 capsule (15 mg total) by mouth daily. 01/26/23     pioglitazone (ACTOS) 30 MG tablet Take 30 mg by mouth daily.    [provider]  pregabalin (LYRICA) 300 MG capsule Take 300 mg  by mouth 2 (two) times daily. 02/07/22   [provider]  pregabalin (LYRICA) 300 MG capsule Take 1 capsule (300 mg total) by mouth 2 (two) times daily for neuropathy 01/26/23     sennosides-docusate sodium (SENOKOT-S) 8.6-50 MG tablet Take 2 tablets by mouth daily. Patient not taking: Reported on 02/13/2022 11/10/20   Janine Ores K, PA-C  sertraline (ZOLOFT) 50 MG tablet Take 50 mg by mouth daily. 02/06/22   [provider]  tiZANidine (ZANAFLEX) 4 MG tablet Take 4 mg by mouth daily as needed for muscle spasms. 02/04/22   [provider]  tiZANidine (ZANAFLEX) 4 MG tablet Take 1 tablet (4 mg total) by mouth 3 (three) times daily as needed for muscle spasms 01/26/23     valACYclovir (VALTREX) 500 MG tablet  Take 500 mg by mouth daily. Patient not taking: Reported on 02/13/2022    [provider]      Allergies    Lyrica [pregabalin], Sulfonamide derivatives, and Vortioxetine    Review of Systems   Review of Systems  Genitourinary:  Positive for flank pain.  All other systems reviewed and are negative.   Physical Exam Updated Vital Signs BP (!) 136/91 (BP Location: Right Arm)   Pulse 65   Temp 98.3 F (36.8 C) (Oral)   Resp 18   SpO2 100%  Physical Exam Vitals and nursing note reviewed.  Constitutional:      Appearance: Normal appearance.  HENT:     Head: Normocephalic and atraumatic.     Right Ear: External ear normal.     Left Ear: External ear normal.     Nose: Nose normal.     Mouth/Throat:     Mouth: Mucous membranes are moist.     Pharynx: Oropharynx is clear.  Eyes:     Extraocular Movements: Extraocular movements intact.     Conjunctiva/sclera: Conjunctivae normal.     Pupils: Pupils are equal, round, and reactive to light.  Cardiovascular:     Rate and Rhythm: Normal rate and regular rhythm.     Pulses: Normal pulses.     Heart sounds: Normal heart sounds.  Pulmonary:     Effort: Pulmonary effort is normal.     Breath sounds: Normal breath sounds.  Abdominal:     General: Abdomen is flat. Bowel sounds are normal.     Palpations: Abdomen is soft.  Musculoskeletal:        General: Normal range of motion.     Cervical back: Normal range of motion and neck supple.  Skin:    General: Skin is warm.     Capillary Refill: Capillary refill takes less than 2 seconds.  Neurological:     General: No focal deficit present.     Mental Status: She is alert and oriented to person, place, and time.  Psychiatric:        Mood and Affect: Mood normal.        Behavior: Behavior normal.     ED Results / Procedures / Treatments   Labs (all labs ordered are listed, but only abnormal results are displayed) Labs Reviewed  COMPREHENSIVE METABOLIC PANEL - Abnormal;  Notable for the following components:      Result Value   Potassium 3.2 (*)    Glucose, Bld 296 (*)    All other components within normal limits  CBC WITH DIFFERENTIAL/PLATELET - Abnormal; Notable for the following components:   WBC 12.3 (*)    Neutro Abs 9.0 (*)  All other components within normal limits  URINALYSIS, ROUTINE W REFLEX MICROSCOPIC - Abnormal; Notable for the following components:   APPearance CLOUDY (*)    Glucose, UA >=500 (*)    Leukocytes,Ua LARGE (*)    Bacteria, UA RARE (*)    All other components within normal limits    EKG None  Radiology CT Renal Stone Study  Result Date: 06/20/2023 CLINICAL DATA:  Abdominal/flank pain, stone suspected EXAM: CT ABDOMEN AND PELVIS WITHOUT CONTRAST TECHNIQUE: Multidetector CT imaging of the abdomen and pelvis was performed following the standard protocol without IV contrast. RADIATION DOSE REDUCTION: This exam was performed according to the departmental dose-optimization program which includes automated exposure control, adjustment of the mA and/or kV according to patient size and/or use of iterative reconstruction technique. COMPARISON:  None Available. FINDINGS: Lower chest: No acute abnormality. Hepatobiliary: Unremarkable unenhanced appearance of the liver. No focal liver lesion identified. Gallbladder within normal limits. No hyperdense gallstone. No biliary dilatation. Pancreas: Unremarkable. No pancreatic ductal dilatation or surrounding inflammatory changes. Spleen: Normal in size without focal abnormality. Adrenals/Urinary Tract: Adrenal glands are unremarkable. Kidneys are normal, without renal calculi, solid lesion, or hydronephrosis. Urinary bladder is decompressed and partially obscured by streak artifact from patient's left hip prosthesis. Stomach/Bowel: Stomach is within normal limits. Surgically absent appendix. Left-sided colonic diverticulosis. No evidence of bowel wall thickening, distention, or inflammatory changes.  Vascular/Lymphatic: Scattered aortoiliac atherosclerotic calcifications without aneurysm. Retroaortic left renal vein. No abdominopelvic lymphadenopathy. Reproductive: Uterus and bilateral adnexa are unremarkable. Other: No free fluid. No abdominopelvic fluid collection. No pneumoperitoneum. No abdominal wall hernia. Musculoskeletal: Previous left total hip arthroplasty. Advanced degenerative changes of the right hip. Degenerative disc disease of L3-4 and L4-5. No acute bony abnormality. IMPRESSION: 1. No acute abdominopelvic findings. Specifically, no evidence of obstructive uropathy. 2. Diverticulosis without evidence of acute diverticulitis. 3. Aortic atherosclerosis (ICD10-I70.0). Electronically Signed   By: Duanne Guess D.O.   On: 06/20/2023 13:07    Procedures Procedures    Medications Ordered in ED Medications  ketorolac (TORADOL) 30 MG/ML injection 30 mg (has no administration in time range)  sodium chloride 0.9 % bolus 1,000 mL (0 mLs Intravenous Stopped 06/20/23 1451)  morphine (PF) 4 MG/ML injection 4 mg (4 mg Intravenous Given 06/20/23 1250)  ondansetron (ZOFRAN) injection 4 mg (4 mg Intravenous Given 06/20/23 1251)    ED Course/ Medical Decision Making/ A&P                             Medical Decision Making Amount and/or Complexity of Data Reviewed Labs: ordered. Radiology: ordered.  Risk Prescription drug management.   This patient presents to the ED for concern of flank pain, this involves an extensive number of treatment options, and is a complaint that carries with it a high risk of complications and morbidity.  The differential diagnosis includes msk, renal stone, cholelithiasis   Co morbidities that complicate the patient evaluation  arthritis, hld, anxiety, gerd, htn, dm, hypothyroidism, depression, breast cancer s/p lumpectomy + radiation in 2009   Additional history obtained:  Additional history obtained from epic chart review External records from  outside source obtained and reviewed including EMS report   Lab Tests:  I Ordered, and personally interpreted labs.  The pertinent results include:  cbc with wbc elevated at 12.3, ua + leuk, 11-20 wbcs, but 11-20 epis (contaminated)   Imaging Studies ordered:  I ordered imaging studies including ct renal  I independently visualized  and interpreted imaging which showed  1. No acute abdominopelvic findings. Specifically, no evidence of  obstructive uropathy.  2. Diverticulosis without evidence of acute diverticulitis.  3. Aortic atherosclerosis (ICD10-I70.0).   I agree with the radiologist interpretation   Cardiac Monitoring:  The patient was maintained on a cardiac monitor.  I personally viewed and interpreted the cardiac monitored which showed an underlying rhythm of: nsr   Medicines ordered and prescription drug management:  I ordered medication including ivfs/morphine/zofran  for sx  Reevaluation of the patient after these medicines showed that the patient improved I have reviewed the patients home medicines and have made adjustments as needed   Test Considered:  ct   Critical Interventions:  Pain control  Problem List / ED Course:  Right flank pain:  no kidney stone/rash/nl labs other than hyperglycemia.  Likely msk.  Pt just had 150 oxy 10s filled on 6/19.  I will add ibuprofen and robaxin. Hyperglycemia:  pt has not been taking her mounjaro as the copay is too much.  Pt instructed to call PCP and make an appt to talk about her dm.   Reevaluation:  After the interventions noted above, I reevaluated the patient and found that they have :improved   Social Determinants of Health:  Lives at home   Dispostion:  After consideration of the diagnostic results and the patients response to treatment, I feel that the patent would benefit from discharge with outpatient f/u.          Final Clinical Impression(s) / ED Diagnoses Final diagnoses:  Strain of  lumbar region, initial encounter  Hyperglycemia due to diabetes mellitus (HCC)    Rx / DC Orders ED Discharge Orders          Ordered    ibuprofen (ADVIL) 600 MG tablet  Every 6 hours PRN        06/20/23 1450    methocarbamol (ROBAXIN) 500 MG tablet  2 times daily        06/20/23 1450              Jacalyn Lefevre, MD 06/20/23 1453

## 2023-06-20 NOTE — Discharge Instructions (Addendum)
Follow up with your PCP regarding your blood sugar.  You can take your normal pain meds plus add the robaxin and ibuprofen which will help the pain.

## 2023-06-20 NOTE — ED Triage Notes (Signed)
Patient arrives by EMS with c/o mid upper right back pain that radiates into left side.   Patient reports pain started around 0900 and reports pain 10/10.   Denies injury.    EMS CBG 300   Patient reports has not taken medication for diabetes.

## 2023-09-11 ENCOUNTER — Other Ambulatory Visit (HOSPITAL_COMMUNITY): Payer: Self-pay

## 2023-09-11 MED ORDER — OXYCODONE-ACETAMINOPHEN 10-325 MG PO TABS
1.0000 | ORAL_TABLET | Freq: Every day | ORAL | 0 refills | Status: DC
  Filled 2023-09-11: qty 150, 30d supply, fill #0

## 2023-09-11 MED ORDER — PREGABALIN 300 MG PO CAPS
300.0000 mg | ORAL_CAPSULE | Freq: Two times a day (BID) | ORAL | 0 refills | Status: DC | PRN
Start: 2023-09-11 — End: 2024-08-09
  Filled 2023-09-11: qty 60, 30d supply, fill #0

## 2023-09-11 MED ORDER — TIZANIDINE HCL 4 MG PO TABS
4.0000 mg | ORAL_TABLET | Freq: Three times a day (TID) | ORAL | 0 refills | Status: DC | PRN
  Filled 2023-09-11: qty 90, 30d supply, fill #0

## 2023-09-11 MED ORDER — DICLOFENAC SODIUM 75 MG PO TBEC
75.0000 mg | DELAYED_RELEASE_TABLET | Freq: Every day | ORAL | 0 refills | Status: DC
  Filled 2023-09-11: qty 30, 30d supply, fill #0

## 2023-09-11 MED ORDER — PHENTERMINE HCL 15 MG PO CAPS
15.0000 mg | ORAL_CAPSULE | Freq: Every day | ORAL | 1 refills | Status: DC
  Filled 2023-09-11: qty 30, 30d supply, fill #0

## 2023-09-26 NOTE — Progress Notes (Signed)
Flulaval administered left deltoid/pt tolerated injection well

## 2023-11-08 NOTE — Progress Notes (Signed)
Erroneous encounter

## 2023-11-09 ENCOUNTER — Other Ambulatory Visit: Payer: Self-pay

## 2023-11-09 ENCOUNTER — Other Ambulatory Visit (HOSPITAL_COMMUNITY): Payer: Self-pay

## 2023-11-09 MED ORDER — PREGABALIN 300 MG PO CAPS
300.0000 mg | ORAL_CAPSULE | Freq: Two times a day (BID) | ORAL | 0 refills | Status: DC
  Filled 2023-11-09: qty 14, 7d supply, fill #0

## 2023-11-09 MED ORDER — TIZANIDINE HCL 4 MG PO TABS
4.0000 mg | ORAL_TABLET | Freq: Three times a day (TID) | ORAL | 0 refills | Status: DC | PRN
  Filled 2023-11-09: qty 21, 7d supply, fill #0

## 2023-11-09 MED ORDER — ERGOCALCIFEROL 1.25 MG (50000 UT) PO CAPS
50000.0000 [IU] | ORAL_CAPSULE | ORAL | 1 refills | Status: DC
  Filled 2023-11-09: qty 12, 84d supply, fill #0

## 2023-11-09 MED ORDER — DICLOFENAC SODIUM 75 MG PO TBEC
75.0000 mg | DELAYED_RELEASE_TABLET | Freq: Every day | ORAL | 0 refills | Status: DC
  Filled 2023-11-09: qty 30, 30d supply, fill #0

## 2023-11-09 MED ORDER — OXYCODONE-ACETAMINOPHEN 10-325 MG PO TABS
1.0000 | ORAL_TABLET | Freq: Every day | ORAL | 0 refills | Status: DC | PRN
  Filled 2023-11-09: qty 35, 8d supply, fill #0

## 2023-11-15 ENCOUNTER — Other Ambulatory Visit (HOSPITAL_COMMUNITY): Payer: Self-pay

## 2023-11-15 MED ORDER — OXYCODONE-ACETAMINOPHEN 10-325 MG PO TABS
1.0000 | ORAL_TABLET | Freq: Every day | ORAL | 0 refills | Status: DC
  Filled 2023-11-15: qty 35, 8d supply, fill #0

## 2023-11-15 MED ORDER — DICLOFENAC SODIUM 75 MG PO TBEC
75.0000 mg | DELAYED_RELEASE_TABLET | Freq: Every day | ORAL | 0 refills | Status: DC
  Filled 2023-11-15: qty 30, 30d supply, fill #0

## 2023-11-15 MED ORDER — PREGABALIN 300 MG PO CAPS
300.0000 mg | ORAL_CAPSULE | Freq: Two times a day (BID) | ORAL | 0 refills | Status: DC
  Filled 2023-11-15: qty 14, 7d supply, fill #0

## 2023-11-15 MED ORDER — TIZANIDINE HCL 4 MG PO TABS
4.0000 mg | ORAL_TABLET | Freq: Three times a day (TID) | ORAL | 0 refills | Status: DC | PRN
  Filled 2023-11-15: qty 21, 7d supply, fill #0

## 2023-12-26 ENCOUNTER — Other Ambulatory Visit (HOSPITAL_COMMUNITY): Payer: Self-pay

## 2024-01-23 ENCOUNTER — Emergency Department (HOSPITAL_COMMUNITY)
Admission: EM | Admit: 2024-01-23 | Discharge: 2024-01-24 | Disposition: A | Discharge status: Home / Self Care | Payer: Self-pay | Attending: Emergency Medicine | Admitting: Emergency Medicine

## 2024-01-23 ENCOUNTER — Other Ambulatory Visit: Payer: Self-pay

## 2024-01-23 ENCOUNTER — Emergency Department (HOSPITAL_COMMUNITY): Payer: Self-pay

## 2024-01-23 ENCOUNTER — Encounter (HOSPITAL_COMMUNITY): Payer: Self-pay

## 2024-01-23 ENCOUNTER — Ambulatory Visit: Payer: Medicaid Other | Admitting: Nurse Practitioner

## 2024-01-23 DIAGNOSIS — E1165 Type 2 diabetes mellitus with hyperglycemia: Secondary | ICD-10-CM | POA: Insufficient documentation

## 2024-01-23 DIAGNOSIS — R079 Chest pain, unspecified: Secondary | ICD-10-CM | POA: Diagnosis not present

## 2024-01-23 DIAGNOSIS — Z794 Long term (current) use of insulin: Secondary | ICD-10-CM | POA: Diagnosis not present

## 2024-01-23 DIAGNOSIS — R739 Hyperglycemia, unspecified: Secondary | ICD-10-CM

## 2024-01-23 LAB — URINALYSIS, ROUTINE W REFLEX MICROSCOPIC
Bilirubin Urine: NEGATIVE
Glucose, UA: >=500 mg/dL — AB
Ketones, ur: NEGATIVE mg/dL
Nitrite: NEGATIVE
Protein, ur: 30 mg/dL — AB
Specific Gravity, Urine: 1.016 (ref 1.005–1.030)
WBC, UA: >50 WBC/hpf (ref 0–5)
pH: 5 (ref 5.0–8.0)

## 2024-01-23 LAB — COMPREHENSIVE METABOLIC PANEL
ALT: 12 U/L (ref 0–44)
AST: 19 U/L (ref 15–41)
Albumin: 3.5 g/dL (ref 3.5–5.0)
Alkaline Phosphatase: 71 U/L (ref 38–126)
Anion gap: 12 (ref 5–15)
BUN: 18 mg/dL (ref 8–23)
CO2: 24 mmol/L (ref 22–32)
Calcium: 9.2 mg/dL (ref 8.9–10.3)
Chloride: 99 mmol/L (ref 98–111)
Creatinine, Ser: 0.82 mg/dL (ref 0.44–1.00)
GFR, Estimated: >60 mL/min (ref >60)
Glucose, Bld: 245 mg/dL — ABNORMAL HIGH (ref 70–99)
Potassium: 4.2 mmol/L (ref 3.5–5.1)
Sodium: 135 mmol/L (ref 135–145)
Total Bilirubin: 1.2 mg/dL (ref 0.0–1.2)
Total Protein: 6.6 g/dL (ref 6.5–8.1)

## 2024-01-23 LAB — CBC
HCT: 40 % (ref 36.0–46.0)
Hemoglobin: 13.4 g/dL (ref 12.0–15.0)
MCH: 30.1 pg (ref 26.0–34.0)
MCHC: 33.5 g/dL (ref 30.0–36.0)
MCV: 89.9 fL (ref 80.0–100.0)
Platelets: 250 10*3/uL (ref 150–400)
RBC: 4.45 MIL/uL (ref 3.87–5.11)
RDW: 13.2 % (ref 11.5–15.5)
WBC: 8.1 10*3/uL (ref 4.0–10.5)
nRBC: 0 % (ref 0.0–0.2)

## 2024-01-23 LAB — LIPASE, BLOOD: Lipase: 40 U/L (ref 11–51)

## 2024-01-23 LAB — CBG MONITORING, ED: Glucose-Capillary: 245 mg/dL — ABNORMAL HIGH (ref 70–99)

## 2024-01-23 LAB — BETA-HYDROXYBUTYRIC ACID: Beta-Hydroxybutyric Acid: 0.13 mmol/L (ref 0.05–0.27)

## 2024-01-23 NOTE — ED Triage Notes (Addendum)
Pt states she went to [facility] mobile nursing unit, was told she had hyperglycemia; states she was treated for bronchitis on 1/2, did not finish abx; also c/o CP and bilateral ear pain; hx DM; pt states she is homeless and has been having to haul a lot of belongings; states she has also had diarrhea, dry cough; denies fevers; pt taking ozempic  Verbal consent given for mse

## 2024-01-23 NOTE — ED Provider Triage Note (Signed)
Emergency Medicine Provider Triage Evaluation Note  CALLEE ROHRIG , a 63 y.o. female  was evaluated in triage.  Pt complains of elevated blood sugar, ongoing cough. Went to mobile clinic today and blood sugar was 486. She is on ozempic for diabetes, has not taken in a week. Currently unhoused.   Review of Systems  Positive: Dry cough, polyuria, polydipsia, chest pain with coughing, neck pain, SOB, diarrhea Negative: Leg pain, swelling  Physical Exam  BP 131/73 (BP Location: Right Arm)   Pulse 82   Temp 98.6 F (37 C)   Resp 16   Wt 68 kg   SpO2 100%   BMI 27.44 kg/m  Gen:   Awake, no distress   Resp:  Normal effort  MSK:   Moves extremities without difficulty  Other:    Medical Decision Making  Medically screening exam initiated at 2:04 PM.  Appropriate orders placed.  EYDIE WORMLEY was informed that the remainder of the evaluation will be completed by another provider, this initial triage assessment does not replace that evaluation, and the importance of remaining in the ED until their evaluation is complete.  Per note from this AM,  Pt here for vital sign check and complaints of non-productive cough x 1 week, headache. Recent history of bronchitis diagnosis Jan 2, given doxycycline. She did not complete the antibiotics. BS diminished bilaterally.    She also has history of diabetes and has not taken medications since last week.  Today fsbs 486 with polydipsia, polyuria.  Pt has limited access to food. Pt states that she would like to go to emergency room to be checked out.        Clearnce Leja T, PA-C 01/23/24 1405

## 2024-01-23 NOTE — Progress Notes (Signed)
Pt here for vital sign check and complaints of non-productive cough x 1 week, headache. Recent history of bronchitis diagnosis Jan 2, given doxycycline. She did not complete the antibiotics. BS diminished bilaterally. No acute distress.   She also has history of diabetes and has not taken medications since last week.  Today fsbs 486 with polydipsia, polyuria.  Pt has limited access to food. Pt states that she would like to go to emergency room to be checked out.    Pt given bus passes in order to go to the ED.

## 2024-01-24 ENCOUNTER — Other Ambulatory Visit (HOSPITAL_COMMUNITY): Payer: Self-pay

## 2024-01-24 ENCOUNTER — Encounter (HOSPITAL_COMMUNITY): Payer: Self-pay

## 2024-01-24 MED ORDER — OZEMPIC (2 MG/DOSE) 8 MG/3ML ~~LOC~~ SOPN
2.0000 mg | PEN_INJECTOR | SUBCUTANEOUS | 0 refills | Status: DC
  Filled 2024-01-24: qty 3, 28d supply, fill #0

## 2024-01-24 NOTE — ED Notes (Signed)
This RN reviewed discharge instructions with patient. She verbalized understanding and denied any further questions. PT well appearing upon discharge and reports no pain. Pt ambulated with stable gait to exit with friend. Gave pt sandwich bag and drink and socks. Informed pt she would receive call for TOC.

## 2024-01-24 NOTE — Discharge Instructions (Signed)
I have sent new script for your ozempic to pharmacy here. Our case management team should be following up with you. Recommend to follow-up with your primary care doctor. Return to the ED for new or worsening symptoms.

## 2024-01-24 NOTE — ED Provider Notes (Signed)
[place] EMERGENCY DEPARTMENT AT [facility] Provider Note   CSN: [id number] Arrival date & time: 01/23/24  1252     History  Chief Complaint  Patient presents with   Hyperglycemia   Chest Pain    CHENE KASINGER is a 63 y.o. female.  The history is provided by the patient and medical records.  Hyperglycemia Associated symptoms: chest pain, increased thirst and polyuria   Chest Pain  63 year old female with history of diabetes, obesity, osteoarthritis, anxiety, presenting to the ED with concern of hyperglycemia.  Seen at mobile clinic today, glucose was in the 400s and she was sent here for further evaluation.  Reports she has been having polyuria and polydipsia recently.  She has been on Ozempic 2mg  dosing weekly for the past 6 months or so but does not feel like it is fully controlling her sugars.  She did not have her shot this week as her medication is at a friend's house [place] the other side of town and has been unable to go get it.  Also reports cough for a few weeks now.  Was treated for bronchitis previously.  He has not had any nausea or vomiting.  Home Medications Prior to Admission medications   Medication Sig Start Date End Date Taking? Authorizing Provider  Semaglutide, 2 MG/DOSE, (OZEMPIC, 2 MG/DOSE,) 8 MG/3ML SOPN Inject 2 mg into the skin once a week. 01/24/24  Yes Garlon Hatchet, PA-C  amLODipine (NORVASC) 10 MG tablet Take 10 mg by mouth daily.     [provider]  amoxicillin-clavulanate (AUGMENTIN) 875-125 MG tablet Take 1 tablet by mouth 2 (two) times daily. One po bid x 7 days 02/13/22   Mancel Bale, MD  atorvastatin (LIPITOR) 20 MG tablet Take 20 mg by mouth daily.     [provider]  Biotin 40981 MCG TABS Take 10,000 mcg by mouth daily.    [provider]  Cholecalciferol (VITAMIN D) 125 MCG (5000 UT) CAPS Take 5,000 Units by mouth daily.    [provider]  diclofenac (VOLTAREN) 75 MG EC tablet Take 75 mg by mouth  2 (two) times daily. 02/04/22   [provider]  diclofenac (VOLTAREN) 75 MG EC tablet Take 1 tablet (75 mg total) by mouth daily. 01/26/23     diclofenac (VOLTAREN) 75 MG EC tablet Take 1 tablet (75 mg total) by mouth daily. 09/11/23     diclofenac (VOLTAREN) 75 MG EC tablet Take 1 tablet (75 mg total) by mouth daily. 11/09/23     diclofenac (VOLTAREN) 75 MG EC tablet Take 1 tablet (75 mg total) by mouth daily. 11/15/23     ergocalciferol (VITAMIN D2) 1.25 MG (50000 UT) capsule Take 1 capsule (50,000 Units total) by mouth once a week. 11/09/23     ibuprofen (ADVIL) 600 MG tablet Take 1 tablet (600 mg total) by mouth every 6 (six) hours as needed. 06/20/23   Jacalyn Lefevre, MD  INVOKANA 300 MG TABS tablet Take 300 mg by mouth every morning. 01/10/22   [provider]  levothyroxine (SYNTHROID, LEVOTHROID) 75 MCG tablet Take 75 mcg by mouth daily before breakfast.  01/05/16   [provider]  losartan (COZAAR) 100 MG tablet Take 100 mg by mouth daily.     [provider]  methocarbamol (ROBAXIN) 500 MG tablet Take 1 tablet (500 mg total) by mouth 2 (two) times daily. 06/20/23   Jacalyn Lefevre, MD  naloxone [facility] nasal spray 4 mg/0.1 mL Place 1 spray  into the nose Once PRN. Patient not taking: Reported on 02/13/2022 02/04/22   [provider]  Omeprazole 20 MG TBEC Take 20 mg by mouth daily.    [provider]  oxyCODONE-acetaminophen (PERCOCET) 10-325 MG tablet Take 1 tablet by mouth 5 (five) times daily as needed for pain. 01/12/22   [provider]  oxyCODONE-acetaminophen (PERCOCET) 10-325 MG tablet Take 1 tablet by mouth 5 (five) times daily as needed for pain. 06/03/22     oxyCODONE-acetaminophen (PERCOCET) 10-325 MG tablet Take 1 tablet by mouth 5 (five) times daily as needed for pain 11/28/22     oxyCODONE-acetaminophen (PERCOCET) 10-325 MG tablet Take 1 tablet by mouth 5 (five) times daily as needed for pain 01/26/23      oxyCODONE-acetaminophen (PERCOCET) 10-325 MG tablet Take 1 tablet by mouth 5 (five) times daily as needed for pain. 09/11/23     oxyCODONE-acetaminophen (PERCOCET) 10-325 MG tablet Take 1 tablet by mouth 5 (five) times daily as needed for pain. 11/09/23     oxyCODONE-acetaminophen (PERCOCET) 10-325 MG tablet Take 1 tablet by mouth 5 (five) times daily as needed for pain. 11/15/23     phentermine 15 MG capsule Take 1 capsule (15 mg total) by mouth daily. 01/26/23     phentermine 15 MG capsule Take 1 capsule (15 mg total) by mouth daily. 09/11/23     pioglitazone (ACTOS) 30 MG tablet Take 30 mg by mouth daily.    [provider]  pregabalin (LYRICA) 300 MG capsule Take 300 mg by mouth 2 (two) times daily. 02/07/22   [provider]  pregabalin (LYRICA) 300 MG capsule Take 1 capsule (300 mg total) by mouth 2 (two) times daily for neuropathy 01/26/23     pregabalin (LYRICA) 300 MG capsule Take 1 capsule (300 mg total) by mouth 2 (two) times daily for neuropathy. 09/11/23     pregabalin (LYRICA) 300 MG capsule Take 1 capsule (300 mg total) by mouth 2 (two) times daily for neuropathy. 11/09/23     pregabalin (LYRICA) 300 MG capsule Take 1 capsule (300 mg total) by mouth 2 (two) times daily for neuropathy. 11/15/23     sennosides-docusate sodium (SENOKOT-S) 8.6-50 MG tablet Take 2 tablets by mouth daily. Patient not taking: Reported on 02/13/2022 11/10/20   Janine Ores K, PA-C  sertraline (ZOLOFT) 50 MG tablet Take 50 mg by mouth daily. 02/06/22   [provider]  tiZANidine (ZANAFLEX) 4 MG tablet Take 4 mg by mouth daily as needed for muscle spasms. 02/04/22   [provider]  tiZANidine (ZANAFLEX) 4 MG tablet Take 1 tablet (4 mg total) by mouth 3 (three) times daily as needed for muscle spasms 01/26/23     tiZANidine (ZANAFLEX) 4 MG tablet Take 1 tablet (4 mg total) by mouth 3 (three) times daily as needed for muscle spasms. Separate from opioids by 1-2 hours to avoid  oversedation. 09/11/23     tiZANidine (ZANAFLEX) 4 MG tablet Take 1 tablet (4 mg total) by mouth 3 (three) times daily as needed for muscle spasms. Separate from opioids by 1-2 hours to avoid over sedation. 11/09/23     tiZANidine (ZANAFLEX) 4 MG tablet Take 1 tablet (4 mg total) by mouth 3 (three) times daily as needed for muscle spasms. Separate from opioids by 1-2 hours to avoid oversedation. 11/15/23     valACYclovir (VALTREX) 500 MG tablet Take 500 mg by mouth daily. Patient not taking: Reported on 02/13/2022    [provider]  Allergies    Pregabalin, Sulfonamide derivatives, and Vortioxetine    Review of Systems   Review of Systems  Cardiovascular:  Positive for chest pain.  Endocrine: Positive for polydipsia and polyuria.  All other systems reviewed and are negative.   Physical Exam Updated Vital Signs BP 122/65 (BP Location: Right Arm)   Pulse 73   Temp 98.5 F (36.9 C) (Oral)   Resp 17   Wt 68 kg   SpO2 99%   BMI 27.44 kg/m   Physical Exam Vitals and nursing note reviewed.  Constitutional:      Appearance: She is well-developed.  HENT:     Head: Normocephalic and atraumatic.  Eyes:     Conjunctiva/sclera: Conjunctivae normal.     Pupils: Pupils are equal, round, and reactive to light.  Cardiovascular:     Rate and Rhythm: Normal rate and regular rhythm.     Heart sounds: Normal heart sounds.  Pulmonary:     Effort: Pulmonary effort is normal.     Breath sounds: Normal breath sounds.  Abdominal:     General: Bowel sounds are normal.     Palpations: Abdomen is soft.  Musculoskeletal:        General: Normal range of motion.     Cervical back: Normal range of motion.  Skin:    General: Skin is warm and dry.  Neurological:     Mental Status: She is alert and oriented to person, place, and time.     ED Results / Procedures / Treatments   Labs (all labs ordered are listed, but only abnormal results are displayed) Labs Reviewed  URINALYSIS,  ROUTINE W REFLEX MICROSCOPIC - Abnormal; Notable for the following components:      Result Value   APPearance CLOUDY (*)    Glucose, UA >=500 (*)    Hgb urine dipstick MODERATE (*)    Protein, ur 30 (*)    Leukocytes,Ua LARGE (*)    Bacteria, UA FEW (*)    All other components within normal limits  COMPREHENSIVE METABOLIC PANEL - Abnormal; Notable for the following components:   Glucose, Bld 245 (*)    All other components within normal limits  CBG MONITORING, ED - Abnormal; Notable for the following components:   Glucose-Capillary 245 (*)    All other components within normal limits  CBC  BETA-HYDROXYBUTYRIC ACID  LIPASE, BLOOD    EKG EKG Interpretation Date/Time:  Tuesday January 23 2024 13:40:49 EST Ventricular Rate:  82 PR Interval:  132 QRS Duration:  84 QT Interval:  362 QTC Calculation: 422 R Axis:   86  Text Interpretation: Normal sinus rhythm ST & T wave abnormality, consider inferolateral ischemia Abnormal ECG When compared with ECG of 02-Nov-2020 13:46, No significant change was found Confirmed by Dione Booze ([id number]) on 01/23/2024 11:28:09 PM  Radiology DG Chest 2 View Result Date: 01/23/2024 CLINICAL DATA:  Cough for several weeks. EXAM: CHEST - 2 VIEW COMPARISON:  February 13, 2018. FINDINGS: The heart size and mediastinal contours are within normal limits. Both lungs are clear. Status post bilateral shoulder arthroplasties. IMPRESSION: No active cardiopulmonary disease. Electronically Signed   By: Lupita Raider M.D.   On: 01/23/2024 15:03    Procedures Procedures    Medications Ordered in ED Medications - No data to display  ED Course/ Medical Decision Making/ A&P  Medical Decision Making Amount and/or Complexity of Data Reviewed Radiology: ordered and independent interpretation performed. ECG/medicine tests: ordered and independent interpretation performed.  Risk Prescription drug management.   63 year old female  presenting to the ED with hyperglycemia.  She was seen at a [facility] mobile clinic today and sent in for further evaluation as her sugars was in the 400s.  Has been on Ozempic for about 6 months or so, missed her shot this week as medication is at her friend's house [place] the [place] side of town.  Does report some polyuria and polydipsia.  Afebrile, non-toxic in appearance here.  Work-up initiated in triage and reviewed--does have hyperglycemia but maintains normal bicarb and anion gap.  No ketones in the urine.  Not clinically consistent with DKA.  CXR without acute findings.  No coughing witnessed here.  As she reports she is unable to get her medications, new script for ozempic sent to pharmacy.  Also requested some assistance as her issues with her medicaid.  TOC consult placed to follow-up in AM.  Also advised to follow-up with PCP.  Return here for new concerns.  Final Clinical Impression(s) / ED Diagnoses Final diagnoses:  Hyperglycemia    Rx / DC Orders ED Discharge Orders          Ordered    Semaglutide, 2 MG/DOSE, (OZEMPIC, 2 MG/DOSE,) 8 MG/3ML SOPN  Weekly        01/24/24 0305              Garlon Hatchet, PA-C 01/24/24 0316    Dione Booze, MD 01/24/24 [phone]

## 2024-01-25 ENCOUNTER — Other Ambulatory Visit (HOSPITAL_COMMUNITY): Payer: Self-pay

## 2024-01-25 ENCOUNTER — Other Ambulatory Visit: Payer: Self-pay

## 2024-01-25 LAB — GLUCOSE, POCT (MANUAL RESULT ENTRY): POC Glucose: 259 mg/dL — AB (ref 70–99)

## 2024-01-25 MED ORDER — GLUCOSE BLOOD VI STRP
1.0000 | ORAL_STRIP | Freq: Every day | 3 refills | Status: AC
Start: 2023-07-28 — End: ?
  Filled 2024-01-25 – 2024-02-14 (×2): qty 100, 100d supply, fill #0

## 2024-01-25 NOTE — Congregational Nurse Program (Signed)
  Dept: [phone]   Congregational Nurse Program Note  Date of Encounter: 01/25/2024  Past Medical History: Past Medical History:  Diagnosis Date   Anxiety    Arthritis    Breast cancer (HCC)    Cancer (HCC) 2009   lumpectomy   Depression    takes Remeron nightly   Diabetes mellitus    takes Lantus and Victoza. Average fasting blood sugar runs 89   GERD (gastroesophageal reflux disease)    takes Omeprazole daily   Hyperlipemia    takes Lipitor daily   Hypertension    takes Amlodipine and Losartan daily   Hypothyroidism    takes Synthroid daily   Neck pain    Numbness    in both hands   Personal history of radiation therapy    Primary localized osteoarthrosis of right shoulder 03/21/2017    Encounter Details:  Community Questionnaire - 01/25/24 1118       Questionnaire   Student Assistance [employer]    Location Patient Served  [facility]    Encounter Setting CN site    Population Status Unhoused    Insurance Medicare;Medicaid    Insurance/Financial Assistance Referral N/A    Medication Provided Medication Assistance;Referred to Medication Assistance    Medical Provider Yes    Screening Referrals Made N/A    Medical Referrals Made [facility] PCP/Clinic    Medical Appointment Completed N/A    CNP Interventions Advocate/Support;Navigate Healthcare System;Case Management;Counsel;Spiritual Care;Educate    Screenings CN Performed Blood Glucose    ED Visit Averted N/A    Life-Saving Intervention Made N/A            Client referred by [employer] to assist client in getting diabetic medication Ozempic prescribed to treat her blood glucose levels. She says the copay is close to $500. She recently went to hospital because sugar was elevated no alternative to medicine given. She says that she does have an appointment with her PCP on Monday and since ED didn't provide temporary solution for her we will wait for PCP appointment Monday. She also has a glucometer with no strips because she  cannot afford copay currently. We were able to move the strips for device to [facility] on [address] to help with affordability. Bus passes given. She will pick up from there likely tomorrow so she can know manage her diet and sugar level interm while waiting for PCP appointment. She is advised to go to ED if blood sugars become unmanageable. RN also recommended that client potentially ask PCP to refer her to Endocrinologist to help further with diabetic management.   Client also wanted to know more information about her labs and tests from hospital visit because no one there made her aware. RN informed client of her results and mentioned to client to have PCP follow up on her urinanalysis due to suspicion of potential UTI development.   Client also looking for services related to disability and resources given to client to assist. She is also working on [facility] for there Help Program and other additional services. Additionally client is wanting help with getting [employer] [place] bus information to help her transportation needs.  We will continue working on these processes over the next few weeks and client will return on Tuesday to follow up after Dr.s appointment Monday.

## 2024-01-26 ENCOUNTER — Other Ambulatory Visit (HOSPITAL_COMMUNITY): Payer: Self-pay

## 2024-01-29 ENCOUNTER — Other Ambulatory Visit (HOSPITAL_COMMUNITY): Payer: Self-pay

## 2024-01-29 MED ORDER — OXYCODONE-ACETAMINOPHEN 10-325 MG PO TABS
1.0000 | ORAL_TABLET | Freq: Four times a day (QID) | ORAL | 0 refills | Status: DC | PRN
  Filled 2024-01-29: qty 60, 15d supply, fill #0

## 2024-02-05 ENCOUNTER — Other Ambulatory Visit: Payer: Self-pay

## 2024-02-14 ENCOUNTER — Other Ambulatory Visit: Payer: Self-pay

## 2024-02-27 ENCOUNTER — Other Ambulatory Visit (HOSPITAL_COMMUNITY): Payer: Self-pay

## 2024-02-27 ENCOUNTER — Other Ambulatory Visit: Payer: Self-pay | Admitting: Family Medicine

## 2024-02-27 DIAGNOSIS — N6342 Unspecified lump in left breast, subareolar: Secondary | ICD-10-CM

## 2024-02-27 DIAGNOSIS — Z1231 Encounter for screening mammogram for malignant neoplasm of breast: Secondary | ICD-10-CM

## 2024-02-27 MED ORDER — ACCU-CHEK GUIDE TEST VI STRP
ORAL_STRIP | Freq: Three times a day (TID) | 3 refills | Status: DC
Start: 2024-02-27 — End: 2024-08-08
  Filled 2024-02-27: qty 300, 100d supply, fill #0

## 2024-02-27 MED ORDER — FLUCONAZOLE 150 MG PO TABS
150.0000 mg | ORAL_TABLET | Freq: Once | ORAL | 0 refills | Status: AC
  Filled 2024-02-27: qty 2, 2d supply, fill #0

## 2024-02-27 MED ORDER — ACCU-CHEK GUIDE W/DEVICE KIT
PACK | 0 refills | Status: DC
  Filled 2024-02-27: qty 1, 365d supply, fill #0

## 2024-02-27 MED ORDER — CEPHALEXIN 500 MG PO CAPS
500.0000 mg | ORAL_CAPSULE | Freq: Three times a day (TID) | ORAL | 0 refills | Status: DC
  Filled 2024-02-27: qty 30, 10d supply, fill #0

## 2024-02-28 ENCOUNTER — Encounter (HOSPITAL_COMMUNITY): Payer: Self-pay

## 2024-02-28 ENCOUNTER — Other Ambulatory Visit (HOSPITAL_COMMUNITY): Payer: Self-pay

## 2024-03-18 ENCOUNTER — Ambulatory Visit: Admitting: Physician Assistant

## 2024-03-18 ENCOUNTER — Other Ambulatory Visit (HOSPITAL_COMMUNITY): Payer: Self-pay

## 2024-03-18 ENCOUNTER — Encounter: Payer: Self-pay | Admitting: Physician Assistant

## 2024-03-18 VITALS — BP 164/86 | HR 70 | Ht 62.0 in | Wt 157.0 lb

## 2024-03-18 DIAGNOSIS — E039 Hypothyroidism, unspecified: Secondary | ICD-10-CM

## 2024-03-18 DIAGNOSIS — Z794 Long term (current) use of insulin: Secondary | ICD-10-CM | POA: Diagnosis not present

## 2024-03-18 DIAGNOSIS — R519 Headache, unspecified: Secondary | ICD-10-CM

## 2024-03-18 DIAGNOSIS — E1165 Type 2 diabetes mellitus with hyperglycemia: Secondary | ICD-10-CM | POA: Diagnosis not present

## 2024-03-18 DIAGNOSIS — G8929 Other chronic pain: Secondary | ICD-10-CM | POA: Diagnosis not present

## 2024-03-18 DIAGNOSIS — I10 Essential (primary) hypertension: Secondary | ICD-10-CM

## 2024-03-18 DIAGNOSIS — Z5901 Sheltered homelessness: Secondary | ICD-10-CM

## 2024-03-18 MED ORDER — LEVOTHYROXINE SODIUM 75 MCG PO TABS
75.0000 ug | ORAL_TABLET | Freq: Every day | ORAL | 1 refills | Status: DC
  Filled 2024-03-18: qty 30, 30d supply, fill #0
  Filled 2024-06-19: qty 30, 30d supply, fill #1

## 2024-03-18 MED ORDER — AMLODIPINE BESYLATE 10 MG PO TABS
10.0000 mg | ORAL_TABLET | Freq: Every day | ORAL | 1 refills | Status: DC
  Filled 2024-03-18: qty 30, 30d supply, fill #0
  Filled 2024-06-19: qty 30, 30d supply, fill #1

## 2024-03-18 MED ORDER — LANTUS SOLOSTAR 100 UNIT/ML ~~LOC~~ SOPN
10.0000 [IU] | PEN_INJECTOR | Freq: Every day | SUBCUTANEOUS | 11 refills | Status: DC
Start: 2024-03-18 — End: 2024-08-09
  Filled 2024-03-18: qty 3, 30d supply, fill #0

## 2024-03-18 MED ORDER — LOSARTAN POTASSIUM 100 MG PO TABS
100.0000 mg | ORAL_TABLET | Freq: Every day | ORAL | 1 refills | Status: DC
  Filled 2024-03-18: qty 30, 30d supply, fill #0
  Filled 2024-06-19: qty 30, 30d supply, fill #1

## 2024-03-18 MED ORDER — TRUEPLUS 5-BEVEL PEN NEEDLES 32G X 4 MM MISC
11 refills | Status: AC
  Filled 2024-03-18: qty 100, 100d supply, fill #0
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
## 2024-03-18 NOTE — Congregational Nurse Program (Signed)
 Client to RN office she needs help with housing resources. She says she is waiting on a check to be sent before she finds out how much housing she can afford. We will reconvene later once she has the amount. Will also refer to [facility] to assist with housing needs. RN also called Medicaid to set up a care coordinator for client to help with additional services. They will inform us in 4 business days. She states she has mammogram appointment in the morning because she has lumps in her breast and has a pain management referral and is wanting to go to [place] for services. She has all of her meds and this RN will continue f/u with her.

## 2024-03-18 NOTE — Patient Instructions (Addendum)
 VISIT SUMMARY:  During today's visit, we discussed your ongoing health concerns, including generalized body aches, upper body pain, headaches, vision problems, and a lump in your breast. We also addressed issues related to your blood pressure, diabetes, and cholesterol management, and the difficulties you have faced in accessing your medications due to financial and insurance constraints.  YOUR PLAN:  -BREAST LUMP: A lump in the breast can be concerning, especially with a history of breast cancer. Continue with your scheduled diagnostic screening to investigate this further.  -CHRONIC PAIN: Chronic pain can result from various conditions, including past surgeries and joint replacements. We will refer you to a pain management clinic to help manage your pain effectively.  -HEADACHES: Headaches and blurry vision can be symptoms of several conditions, including high blood pressure, uncontrolled blood sugar, or thyroid issues. We have refilled your blood pressure and thyroid medications to help address these symptoms.  -HYPERTENSION: Hypertension, or high blood pressure, can lead to serious health issues if not managed properly. We have refilled your blood pressure medications to help control your blood pressure.  -DIABETES MELLITUS: Diabetes Mellitus is a condition where your blood sugar levels are too high. We have prescribed Lantus and ensured you have the necessary supplies to monitor and manage your blood sugar levels.  -HYPERLIPIDEMIA: Hyperlipidemia is a condition where there are high levels of fats (lipids) in your blood. We have refilled your atorvastatin prescription to help manage your cholesterol levels.  -GENERAL HEALTH MAINTENANCE: We discussed the impact of insurance issues on your ability to access medications. It is important to follow up with your primary care provider to resolve these issues and ensure you have the necessary coverage for your medications.  General Headache Without  Cause A headache is pain or discomfort felt around the head or neck area. There are many causes and types of headaches. A few common types include: Tension headaches. Migraine headaches. Cluster headaches. Chronic daily headaches. Sometimes, the specific cause of a headache may not be found. Follow these instructions at home: Watch your condition for any changes. Let your health care provider know about them. Take these steps to help with your condition: Managing pain     Take over-the-counter and prescription medicines only as told by your health care provider. Treatment may include medicines for pain that are taken by mouth or applied to the skin. Lie down in a dark, quiet room when you have a headache. Keep lights dim if bright lights bother you or make your headaches worse. If directed, put ice on your head and neck area: Put ice in a plastic bag. Place a towel between your skin and the bag. Leave the ice on for 20 minutes, 2-3 times per day. Remove the ice if your skin turns bright red. This is very important. If you cannot feel pain, heat, or cold, you have a greater risk of damage to the area. If directed, apply heat to the affected area. Use the heat source that your health care provider recommends, such as a moist heat pack or a heating pad. Place a towel between your skin and the heat source. Leave the heat on for 20-30 minutes. Remove the heat if your skin turns bright red. This is especially important if you are unable to feel pain, heat, or cold. You have a greater risk of getting burned. Eating and drinking Eat meals on a regular schedule. If you drink alcohol: Limit how much you have to: 0-1 drink a day for women who are  not pregnant. 0-2 drinks a day for men. Know how much alcohol is in a drink. In the [place] one drink equals one 12 oz bottle of beer (355 mL), one 5 oz glass of wine (148 mL), or one 1 oz glass of hard liquor (44 mL). Stop drinking caffeine, or decrease  the amount of caffeine you drink. Drink enough fluid to keep your urine pale yellow. General instructions  Keep a headache journal to help find out what may trigger your headaches. For example, write down: What you eat and drink. How much sleep you get. Any change to your diet or medicines. Try massage or other relaxation techniques. Limit stress. Sit up straight, and do not tense your muscles. Do not use any products that contain nicotine or tobacco. These products include cigarettes, chewing tobacco, and vaping devices, such as e-cigarettes. If you need help quitting, ask your health care provider. Exercise regularly as told by your health care provider. Sleep on a regular schedule. Get 7-9 hours of sleep each night, or the amount recommended by your health care provider. Keep all follow-up visits. This is important. Contact a health care provider if: Medicine does not help your symptoms. You have a headache that is different from your usual headache. You have nausea or you vomit. You have a fever. Get help right away if: Your headache: Becomes severe quickly. Gets worse after moderate to intense physical activity. You have any of these symptoms: Repeated vomiting. Pain or stiffness in your neck. Changes to your vision. Pain in an eye or ear. Problems with speech. Muscular weakness or loss of muscle control. Loss of balance or coordination. You feel faint or pass out. You have confusion. You have a seizure. These symptoms may represent a serious problem that is an emergency. Do not wait to see if the symptoms will go away. Get medical help right away. Call your local emergency services (911 in the [place] Do not drive yourself to the hospital. Summary A headache is pain or discomfort felt around the head or neck area. There are many causes and types of headaches. In some cases, the cause may not be found. Keep a headache journal to help find out what may trigger your  headaches. Watch your condition for any changes. Let your health care provider know about them. Contact a health care provider if you have a headache that is different from the usual headache, or if your symptoms are not helped by medicine. Get help right away if your headache becomes severe, you vomit, you have a loss of vision, you lose your balance, or you have a seizure. This information is not intended to replace advice given to you by your health care provider. Make sure you discuss any questions you have with your health care provider. Document Revised: 05/12/2021 Document Reviewed: 05/12/2021 Elsevier Patient Education  2024 [employer].

## 2024-03-19 ENCOUNTER — Other Ambulatory Visit: Payer: Self-pay | Admitting: Family Medicine

## 2024-03-19 ENCOUNTER — Ambulatory Visit
Admission: RE | Admit: 2024-03-19 | Discharge: 2024-03-19 | Disposition: A | Discharge status: Home / Self Care | Source: Ambulatory Visit | Attending: Family Medicine | Admitting: Family Medicine

## 2024-03-19 ENCOUNTER — Ambulatory Visit
Admission: RE | Admit: 2024-03-19 | Discharge: 2024-03-19 | Disposition: A | Discharge status: Home / Self Care | Payer: Self-pay | Source: Ambulatory Visit | Attending: Family Medicine | Admitting: Family Medicine

## 2024-03-19 DIAGNOSIS — N6342 Unspecified lump in left breast, subareolar: Secondary | ICD-10-CM

## 2024-03-25 NOTE — Congregational Nurse Program (Signed)
 Client followed up with RN about housing situation. She is also going to orthopedic doctor for hand pain she says she fell on it and it has been pain and can hardly grasp things without difficulty. RN gave her two bus passes to get to her appointment. RN will follow up with client after appointment.

## 2024-03-26 NOTE — Congregational Nurse Program (Signed)
 Client to RN office seeking assistance with housing support, specifically a housing voucher and temporary hotel stay through the [employer]. RN informed cleint that [facility] staff would be most appropriate and responsive contact for this referral and submitted a referral accordingly. Client also shared that her medicaid was recently transitioned from a family plan to an adult plan and she will need to restart her Medicaid application. RN referred client to [facility] staff for further assistance with navigating Medicaid changes. Client expressed some frustration with sleeping outside, financial hardship and now having her wages garnished. RN provided a safe space for client to process these challenges through active listening, therapeutic communication and the ministry of presence.

## 2024-03-28 NOTE — Congregational Nurse Program (Signed)
 Client to RN office today for assistance with bus passes to her MD appointment to get [place]2 to help her get housing. She has no other needs at this time. RN offered her spiritual encouragement and prayer.

## 2024-04-10 ENCOUNTER — Other Ambulatory Visit (HOSPITAL_COMMUNITY): Payer: Self-pay

## 2024-04-10 MED ORDER — SERTRALINE HCL 100 MG PO TABS
100.0000 mg | ORAL_TABLET | Freq: Every day | ORAL | 0 refills | Status: AC
  Filled 2024-04-10: qty 90, 90d supply, fill #0

## 2024-04-10 MED ORDER — ALPRAZOLAM 1 MG PO TABS
1.0000 mg | ORAL_TABLET | Freq: Two times a day (BID) | ORAL | 2 refills | Status: DC | PRN
  Filled 2024-04-10: qty 60, 30d supply, fill #0
  Filled 2024-05-13: qty 60, 30d supply, fill #1
  Filled 2024-06-10: qty 60, 30d supply, fill #2

## 2024-04-10 MED ORDER — AMPHETAMINE-DEXTROAMPHETAMINE 15 MG PO TABS
15.0000 mg | ORAL_TABLET | Freq: Two times a day (BID) | ORAL | 0 refills | Status: DC
  Filled 2024-06-05: qty 60, 30d supply, fill #0

## 2024-04-10 MED ORDER — AMPHETAMINE-DEXTROAMPHETAMINE 15 MG PO TABS
15.0000 mg | ORAL_TABLET | Freq: Two times a day (BID) | ORAL | 0 refills | Status: DC
  Filled 2024-07-10: qty 60, 30d supply, fill #0

## 2024-04-10 MED ORDER — AMPHETAMINE-DEXTROAMPHETAMINE 15 MG PO TABS
15.0000 mg | ORAL_TABLET | Freq: Two times a day (BID) | ORAL | 0 refills | Status: DC
  Filled 2024-05-07: qty 60, 30d supply, fill #0

## 2024-04-12 ENCOUNTER — Other Ambulatory Visit (HOSPITAL_COMMUNITY): Payer: Self-pay

## 2024-04-15 ENCOUNTER — Other Ambulatory Visit (HOSPITAL_COMMUNITY): Payer: Self-pay

## 2024-04-18 ENCOUNTER — Other Ambulatory Visit (HOSPITAL_COMMUNITY): Payer: Self-pay

## 2024-05-02 DIAGNOSIS — M654 Radial styloid tenosynovitis [de Quervain]: Secondary | ICD-10-CM | POA: Insufficient documentation

## 2024-05-07 ENCOUNTER — Other Ambulatory Visit (HOSPITAL_COMMUNITY): Payer: Self-pay

## 2024-05-13 ENCOUNTER — Other Ambulatory Visit (HOSPITAL_COMMUNITY): Payer: Self-pay

## 2024-06-03 ENCOUNTER — Other Ambulatory Visit (HOSPITAL_COMMUNITY): Payer: Self-pay

## 2024-06-04 ENCOUNTER — Other Ambulatory Visit (HOSPITAL_COMMUNITY): Payer: Self-pay

## 2024-06-05 ENCOUNTER — Other Ambulatory Visit (HOSPITAL_COMMUNITY): Payer: Self-pay

## 2024-06-10 ENCOUNTER — Other Ambulatory Visit (HOSPITAL_COMMUNITY): Payer: Self-pay

## 2024-06-14 DIAGNOSIS — G894 Chronic pain syndrome: Secondary | ICD-10-CM | POA: Insufficient documentation

## 2024-06-14 DIAGNOSIS — M5412 Radiculopathy, cervical region: Secondary | ICD-10-CM | POA: Insufficient documentation

## 2024-06-19 ENCOUNTER — Other Ambulatory Visit (HOSPITAL_COMMUNITY): Payer: Self-pay

## 2024-07-01 ENCOUNTER — Other Ambulatory Visit (HOSPITAL_COMMUNITY): Payer: Self-pay

## 2024-07-10 ENCOUNTER — Other Ambulatory Visit: Payer: Self-pay

## 2024-07-10 ENCOUNTER — Other Ambulatory Visit (HOSPITAL_COMMUNITY): Payer: Self-pay

## 2024-07-22 ENCOUNTER — Other Ambulatory Visit (HOSPITAL_COMMUNITY): Payer: Self-pay

## 2024-07-30 ENCOUNTER — Emergency Department (HOSPITAL_COMMUNITY)
Admission: EM | Admit: 2024-07-30 | Discharge: 2024-07-30 | Disposition: A | Discharge status: Home / Self Care | Attending: Emergency Medicine | Admitting: Emergency Medicine

## 2024-07-30 ENCOUNTER — Other Ambulatory Visit: Payer: Self-pay

## 2024-07-30 ENCOUNTER — Encounter (HOSPITAL_COMMUNITY): Payer: Self-pay

## 2024-07-30 DIAGNOSIS — J029 Acute pharyngitis, unspecified: Secondary | ICD-10-CM | POA: Diagnosis present

## 2024-07-30 DIAGNOSIS — J020 Streptococcal pharyngitis: Secondary | ICD-10-CM | POA: Diagnosis not present

## 2024-07-30 DIAGNOSIS — Z794 Long term (current) use of insulin: Secondary | ICD-10-CM | POA: Diagnosis not present

## 2024-07-30 LAB — RESP PANEL BY RT-PCR (RSV, FLU A&B, COVID)  RVPGX2
Influenza A by PCR: NEGATIVE
Influenza B by PCR: NEGATIVE
Resp Syncytial Virus by PCR: NEGATIVE
SARS Coronavirus 2 by RT PCR: NEGATIVE

## 2024-07-30 LAB — URINALYSIS, ROUTINE W REFLEX MICROSCOPIC
Bilirubin Urine: NEGATIVE
Glucose, UA: >=500 mg/dL — AB
Ketones, ur: NEGATIVE mg/dL
Nitrite: NEGATIVE
Protein, ur: 30 mg/dL — AB
Specific Gravity, Urine: 1.026 (ref 1.005–1.030)
WBC, UA: >50 WBC/hpf (ref 0–5)
pH: 6 (ref 5.0–8.0)

## 2024-07-30 LAB — GROUP A STREP BY PCR: Group A Strep by PCR: DETECTED — AB

## 2024-07-30 MED ORDER — PENICILLIN G BENZATHINE 1200000 UNIT/2ML IM SUSY: 2.4000 10*6.[IU] | PREFILLED_SYRINGE | Freq: Once | INTRAMUSCULAR | Status: DC

## 2024-07-30 MED ORDER — AMOXICILLIN 500 MG PO CAPS
1000.0000 mg | ORAL_CAPSULE | Freq: Every day | ORAL | 0 refills | Status: DC
  Filled 2024-07-30 – 2024-07-31 (×2): qty 18, 9d supply, fill #0

## 2024-07-30 MED ORDER — DEXAMETHASONE 4 MG PO TABS
10.0000 mg | ORAL_TABLET | Freq: Once | ORAL | Status: AC
  Administered 2024-07-30: 10 mg via ORAL
  Filled 2024-07-30: qty 3

## 2024-07-30 MED ORDER — AMOXICILLIN 500 MG PO CAPS
1000.0000 mg | ORAL_CAPSULE | Freq: Once | ORAL | Status: AC
  Administered 2024-07-30: 1000 mg via ORAL
  Filled 2024-07-30: qty 2

## 2024-07-30 MED ORDER — ACETAMINOPHEN 325 MG PO TABS
650.0000 mg | ORAL_TABLET | Freq: Once | ORAL | Status: AC | PRN
  Administered 2024-07-30: 650 mg via ORAL
  Filled 2024-07-30: qty 2

## 2024-07-30 NOTE — Care Management (Signed)
 MATCH Medication Assistance Card *Pharmacies please call [phone] for claim processing assistance Name: Morgan Taylor                                                                                                                                                                                         Relationship Code:  1 ID (MRN): : [id number]                                                                                                                                                                                   Person Code:  01 Bin: [id number] RX Group: [id number] Discharge Date: 07/30/2024                                 RX PCN:  [id number] Expiration Date: 08/09/2024                                           (must be filled within 7 days of discharge)     You have been approved to have the prescriptions written by your discharging physician filled through our [facility] (Medication Assistance Through [facility]) program. This program allows for a one-time (no refills) 34-day supply of selected medications for a low copay amount.  The copay is $0 per prescription.   Only certain pharmacies are participating in this program with [facility]. You will need to select one of the pharmacies from the attached list and take your prescriptions, this letter, and your photo ID to one of  the [facility] pharmacies.  We are excited that you are able to use the [facility] program to get your  medications. These prescriptions must be filled within 7 days of hospital discharge or they will no longer be valid for the [facility] program. Should you have any problems with your prescriptions please contact your case management team member at [phone] for [place]/ [facility].  Thank you,   [facility]

## 2024-07-30 NOTE — Care Management (Signed)
 Prescription sent to [facility] pharmacy with [facility] letter.  Patient unable to afford co-pay pharmacy made aware and patient states she will back in the am to pick up her antibiotics

## 2024-07-30 NOTE — ED Triage Notes (Signed)
 PT arrives via POV. PT reports sore throat, cough, and dysuria for the past couple of days. Pt is AxOx4.

## 2024-07-30 NOTE — Discharge Instructions (Addendum)
 Pick up your antibiotics at the [facility] transitions of care pharmacy.

## 2024-07-30 NOTE — ED Provider Notes (Signed)
 [place] EMERGENCY DEPARTMENT AT [facility] Provider Note   CSN: [id number] Arrival date & time: 07/30/24  1247     Patient presents with: No chief complaint on file.   Morgan Taylor is a 63 y.o. female.   Patient here with sore throat for the last few days.  Nothing makes worse or better.  Denies any difficulty eating or drinking.  She like to be checked for UTI as well.  She denies any fever or chills.  Denies any weakness numbness tingling.  No change in her voice.  No difficulty opening her mouth.  The history is provided by the patient.       Prior to Admission medications   Medication Sig Start Date End Date Taking? Authorizing Provider  ALPRAZolam  (XANAX ) 1 MG tablet Take 1 mg by mouth daily as needed. 02/17/24   [provider]  ALPRAZolam  (XANAX ) 1 MG tablet Take 1 tablet (1 mg total) by mouth 2 (two) times daily as needed for severe anxiety/panic attacks. 04/10/24     amLODipine  (NORVASC ) 10 MG tablet Take 1 tablet (10 mg total) by mouth daily. 03/18/24   Mayers, Cari S, PA-C  amoxicillin -clavulanate (AUGMENTIN ) 875-125 MG tablet Take 1 tablet by mouth 2 (two) times daily. One po bid x 7 days 02/13/22   Lorriane Holmes, MD  amphetamine -dextroamphetamine  (ADDERALL) 15 MG tablet Take 1 tablet by mouth 2 (two) times daily. 02/29/24   [provider]  amphetamine -dextroamphetamine  (ADDERALL) 15 MG tablet Take 1 tablet by mouth 2 (two) times daily for ADHD. 07/04/24     amphetamine -dextroamphetamine  (ADDERALL) 15 MG tablet Take 1 tablet by mouth 2 (two) times daily for ADHD. 06/05/24     amphetamine -dextroamphetamine  (ADDERALL) 15 MG tablet Take 1 tablet by mouth 2 (two) times daily for ADHD. *05/13 05/07/24     atorvastatin  (LIPITOR) 20 MG tablet Take 20 mg by mouth daily.     [provider]  Biotin  10000 MCG TABS Take 10,000 mcg by mouth daily.    [provider]  Blood Glucose Monitoring Suppl (ACCU-CHEK GUIDE) w/Device KIT Use as  directed to check blood sugar up to 3 times per day. 02/27/24     cephALEXin  (KEFLEX ) 500 MG capsule Take 1 capsule (500 mg total) by mouth 3 (three) times daily for 10 days. 02/27/24     Cholecalciferol (VITAMIN D ) 125 MCG (5000 UT) CAPS Take 5,000 Units by mouth daily.    [provider]  diclofenac  (VOLTAREN ) 75 MG EC tablet Take 1 tablet (75 mg total) by mouth daily. 01/26/23     diclofenac  (VOLTAREN ) 75 MG EC tablet Take 1 tablet (75 mg total) by mouth daily. 11/15/23     ergocalciferol  (VITAMIN D2) 1.25 MG (50000 UT) capsule Take 1 capsule (50,000 Units total) by mouth once a week. 11/09/23     glucose blood (ACCU-CHEK GUIDE TEST) test strip Check blood glucose 3 times daily. 02/27/24     glucose blood test strip Use 1 strip daily as directed. 07/28/23     ibuprofen  (ADVIL ) 600 MG tablet Take 1 tablet (600 mg total) by mouth every 6 (six) hours as needed. 06/20/23   Dean Clarity, MD  insulin  glargine (LANTUS  SOLOSTAR) 100 UNIT/ML Solostar Pen Inject 10 Units into the skin daily. 03/18/24   Mayers, Cari S, PA-C  Insulin  Pen Needle (TRUEPLUS 5-BEVEL PEN NEEDLES) 32G X 4 MM MISC To use with Lantus  once daily 03/18/24   Mayers, Cari S, PA-C  INVOKANA 300 MG TABS  tablet Take 300 mg by mouth every morning. 01/10/22   [provider]  levothyroxine  (SYNTHROID ) 75 MCG tablet Take 1 tablet (75 mcg total) by mouth daily before breakfast. 03/18/24   Mayers, Cari S, PA-C  losartan  (COZAAR ) 100 MG tablet Take 1 tablet (100 mg total) by mouth daily. 03/18/24   Mayers, Cari S, PA-C  methocarbamol  (ROBAXIN ) 500 MG tablet Take 1 tablet (500 mg total) by mouth 2 (two) times daily. 06/20/23   Haviland, Julie, MD  naloxone [facility]) nasal spray 4 mg/0.1 mL Place 1 spray into the nose Once PRN. Patient not taking: Reported on 02/13/2022 02/04/22   [provider]  Omeprazole 20 MG TBEC Take 20 mg by mouth daily.    [provider]  oxyCODONE -acetaminophen  (PERCOCET) 10-325 MG tablet Take 1  tablet by mouth 5 (five) times daily as needed for pain. 01/12/22   [provider]  oxyCODONE -acetaminophen  (PERCOCET) 10-325 MG tablet Take 1 tablet by mouth 5 (five) times daily as needed for pain. 06/03/22     oxyCODONE -acetaminophen  (PERCOCET) 10-325 MG tablet Take 1 tablet by mouth 5 (five) times daily as needed for pain 11/28/22     oxyCODONE -acetaminophen  (PERCOCET) 10-325 MG tablet Take 1 tablet by mouth 5 (five) times daily as needed for pain 01/26/23     oxyCODONE -acetaminophen  (PERCOCET) 10-325 MG tablet Take 1 tablet by mouth 5 (five) times daily as needed for pain. 09/11/23     oxyCODONE -acetaminophen  (PERCOCET) 10-325 MG tablet Take 1 tablet by mouth 5 (five) times daily as needed for pain. 11/09/23     oxyCODONE -acetaminophen  (PERCOCET) 10-325 MG tablet Take 1 tablet by mouth 5 (five) times daily as needed for pain. 11/15/23     oxyCODONE -acetaminophen  (PERCOCET) 10-325 MG tablet Take 1 tablet by mouth every 6 (six) hours as needed for severe pain 01/29/24     phentermine  15 MG capsule Take 1 capsule (15 mg total) by mouth daily. 01/26/23     phentermine  15 MG capsule Take 1 capsule (15 mg total) by mouth daily. 09/11/23     pregabalin  (LYRICA ) 300 MG capsule Take 300 mg by mouth 2 (two) times daily. 02/07/22   [provider]  pregabalin  (LYRICA ) 300 MG capsule Take 1 capsule (300 mg total) by mouth 2 (two) times daily for neuropathy 01/26/23     pregabalin  (LYRICA ) 300 MG capsule Take 1 capsule (300 mg total) by mouth 2 (two) times daily for neuropathy. 09/11/23     pregabalin  (LYRICA ) 300 MG capsule Take 1 capsule (300 mg total) by mouth 2 (two) times daily for neuropathy. 11/09/23     pregabalin  (LYRICA ) 300 MG capsule Take 1 capsule (300 mg total) by mouth 2 (two) times daily for neuropathy. 11/15/23     Semaglutide , 2 MG/DOSE, (OZEMPIC , 2 MG/DOSE,) 8 MG/3ML SOPN Inject 2 mg into the skin once a week. 01/24/24   Jarold Olam HERO, PA-C  sennosides-docusate sodium  (SENOKOT-S)  8.6-50 MG tablet Take 2 tablets by mouth daily. Patient not taking: Reported on 02/13/2022 11/10/20   Brown, Blaine K, PA-C  sertraline  (ZOLOFT ) 100 MG tablet Take 1 tablet (100 mg total) by mouth daily for anxiety/depression. 04/10/24     sertraline  (ZOLOFT ) 50 MG tablet Take 50 mg by mouth daily. 02/06/22   [provider]  tiZANidine  (ZANAFLEX ) 4 MG tablet Take 4 mg by mouth daily as needed for muscle spasms. 02/04/22   [provider]  tiZANidine  (ZANAFLEX ) 4 MG tablet Take 1 tablet (4 mg total) by mouth 3 (  three) times daily as needed for muscle spasms 01/26/23     tiZANidine  (ZANAFLEX ) 4 MG tablet Take 1 tablet (4 mg total) by mouth 3 (three) times daily as needed for muscle spasms. Separate from opioids by 1-2 hours to avoid oversedation. 09/11/23     tiZANidine  (ZANAFLEX ) 4 MG tablet Take 1 tablet (4 mg total) by mouth 3 (three) times daily as needed for muscle spasms. Separate from opioids by 1-2 hours to avoid over sedation. 11/09/23     tiZANidine  (ZANAFLEX ) 4 MG tablet Take 1 tablet (4 mg total) by mouth 3 (three) times daily as needed for muscle spasms. Separate from opioids by 1-2 hours to avoid oversedation. 11/15/23     valACYclovir  (VALTREX ) 500 MG tablet Take 500 mg by mouth daily. Patient not taking: Reported on 02/13/2022    [provider]    Allergies: Pregabalin , Sulfonamide derivatives, and Vortioxetine    Review of Systems  Updated Vital Signs BP (!) 152/75 (BP Location: Right Arm)   Pulse 63   Temp 98.4 F (36.9 C)   Resp 16   Ht 5' 2 (1.575 m)   Wt 68 kg   SpO2 98%   BMI 27.44 kg/m   Physical Exam Vitals and nursing note reviewed.  Constitutional:      General: She is not in acute distress.    Appearance: She is well-developed. She is not ill-appearing.  HENT:     Head: Normocephalic and atraumatic.     Mouth/Throat:     Mouth: Mucous membranes are moist.     Pharynx: Posterior oropharyngeal erythema present.  Eyes:      Extraocular Movements: Extraocular movements intact.     Conjunctiva/sclera: Conjunctivae normal.     Pupils: Pupils are equal, round, and reactive to light.  Cardiovascular:     Rate and Rhythm: Normal rate and regular rhythm.     Pulses: Normal pulses.     Heart sounds: Normal heart sounds. No murmur heard. Pulmonary:     Effort: Pulmonary effort is normal. No respiratory distress.     Breath sounds: Normal breath sounds.  Abdominal:     General: Abdomen is flat.     Palpations: Abdomen is soft.     Tenderness: There is no abdominal tenderness.  Musculoskeletal:        General: No swelling.     Cervical back: Normal range of motion and neck supple.  Skin:    General: Skin is warm and dry.     Capillary Refill: Capillary refill takes less than 2 seconds.  Neurological:     General: No focal deficit present.     Mental Status: She is alert.  Psychiatric:        Mood and Affect: Mood normal.     (all labs ordered are listed, but only abnormal results are displayed) Labs Reviewed  GROUP A STREP BY PCR - Abnormal; Notable for the following components:      Result Value   Group A Strep by PCR DETECTED (*)    All other components within normal limits  URINALYSIS, ROUTINE W REFLEX MICROSCOPIC - Abnormal; Notable for the following components:   APPearance CLOUDY (*)    Glucose, UA >=500 (*)    Hgb urine dipstick SMALL (*)    Protein, ur 30 (*)    Leukocytes,Ua MODERATE (*)    Bacteria, UA RARE (*)    All other components within normal limits  RESP PANEL BY RT-PCR (RSV, FLU A&B, COVID)  [id number]  EKG: None  Radiology: No results found.   Procedures   Medications Ordered in the ED  dexamethasone  (DECADRON ) tablet 10 mg (has no administration in time range)  amoxicillin  (AMOXIL ) capsule 1,000 mg (has no administration in time range)  acetaminophen  (TYLENOL ) tablet 650 mg (650 mg Oral Given 07/30/24 1354)                                    Medical Decision  Making Amount and/or Complexity of Data Reviewed Labs: ordered.  Risk OTC drugs. Prescription drug management.   Morgan Taylor is here with sore throat.  Denies any weakness numbness tingling.  Overall strep test is positive.  She has no signs of abscess or major deep space infection otherwise.  Urinalysis negative for infection.  COVID flu RSV test negative.  Overall backorder of penicillin  G unable to offer this for her.  She states she cannot afford antibiotics have talked with case management to see if we can get this filled for her.  I will give her a dose of Decadron  and amoxicillin  here in the ED and arrange for antibiotics for her to be picked up at [facility] pharmacy.  Patient discharged in good condition.  Understands return precautions.  This chart was dictated using voice recognition software.  Despite best efforts to proofread,  errors can occur which can change the documentation meaning.      Final diagnoses:  Strep pharyngitis    ED Discharge Orders     None          Ruthe Cornet, DO 07/30/24 1959

## 2024-07-30 NOTE — ED Notes (Incomplete)
 PT D/C'D AFTER INSTRUCTIONS REVIEWED. PT VERBALIZED UNDERSTANDING. NAD REPORTED OR NOTED AT THIS TIME.

## 2024-07-31 ENCOUNTER — Other Ambulatory Visit: Payer: Self-pay

## 2024-07-31 ENCOUNTER — Other Ambulatory Visit (HOSPITAL_COMMUNITY): Payer: Self-pay

## 2024-08-08 ENCOUNTER — Telehealth: Admitting: Nurse Practitioner

## 2024-08-08 ENCOUNTER — Other Ambulatory Visit: Payer: Self-pay

## 2024-08-08 ENCOUNTER — Encounter: Payer: Self-pay | Admitting: Nurse Practitioner

## 2024-08-08 VITALS — BP 184/95

## 2024-08-08 DIAGNOSIS — M545 Low back pain, unspecified: Secondary | ICD-10-CM | POA: Diagnosis not present

## 2024-08-08 DIAGNOSIS — I10 Essential (primary) hypertension: Secondary | ICD-10-CM

## 2024-08-08 DIAGNOSIS — T3695XA Adverse effect of unspecified systemic antibiotic, initial encounter: Secondary | ICD-10-CM

## 2024-08-08 DIAGNOSIS — K219 Gastro-esophageal reflux disease without esophagitis: Secondary | ICD-10-CM | POA: Diagnosis not present

## 2024-08-08 DIAGNOSIS — E119 Type 2 diabetes mellitus without complications: Secondary | ICD-10-CM | POA: Diagnosis not present

## 2024-08-08 DIAGNOSIS — E785 Hyperlipidemia, unspecified: Secondary | ICD-10-CM

## 2024-08-08 DIAGNOSIS — B379 Candidiasis, unspecified: Secondary | ICD-10-CM

## 2024-08-08 DIAGNOSIS — G8929 Other chronic pain: Secondary | ICD-10-CM

## 2024-08-08 LAB — GLUCOSE, POCT (MANUAL RESULT ENTRY): POC Glucose: 345 mg/dL — AB (ref 70–99)

## 2024-08-08 MED ORDER — ACCU-CHEK GUIDE TEST VI STRP
ORAL_STRIP | Freq: Three times a day (TID) | 3 refills | Status: DC
  Filled 2024-08-08: qty 100, 33d supply, fill #0

## 2024-08-08 MED ORDER — AMLODIPINE BESYLATE 10 MG PO TABS
10.0000 mg | ORAL_TABLET | Freq: Every day | ORAL | 1 refills | Status: AC
  Filled 2024-08-08: qty 90, 90d supply, fill #0

## 2024-08-08 MED ORDER — LOSARTAN POTASSIUM 100 MG PO TABS
100.0000 mg | ORAL_TABLET | Freq: Every day | ORAL | 1 refills | Status: AC
  Filled 2024-08-08: qty 30, 30d supply, fill #0

## 2024-08-08 MED ORDER — ACCU-CHEK GUIDE W/DEVICE KIT
PACK | 0 refills | Status: DC
Start: 2024-08-08 — End: 2024-08-12
  Filled 2024-08-08: qty 1, fill #0

## 2024-08-08 MED ORDER — DICLOFENAC SODIUM 75 MG PO TBEC
75.0000 mg | DELAYED_RELEASE_TABLET | Freq: Every day | ORAL | 0 refills | Status: DC
  Filled 2024-08-08: qty 30, 30d supply, fill #0

## 2024-08-08 MED ORDER — FLUCONAZOLE 150 MG PO TABS
150.0000 mg | ORAL_TABLET | Freq: Once | ORAL | 0 refills | Status: AC
  Filled 2024-08-08: qty 1, 1d supply, fill #0

## 2024-08-08 MED ORDER — ATORVASTATIN CALCIUM 20 MG PO TABS
20.0000 mg | ORAL_TABLET | Freq: Every day | ORAL | 0 refills | Status: AC
  Filled 2024-08-08: qty 90, 90d supply, fill #0

## 2024-08-08 MED ORDER — OMEPRAZOLE 20 MG PO CPDR
20.0000 mg | DELAYED_RELEASE_CAPSULE | Freq: Every day | ORAL | 1 refills | Status: AC
  Filled 2024-08-08: qty 90, 90d supply, fill #0

## 2024-08-08 NOTE — Progress Notes (Signed)
 Virtual Primary Care Telehealth Visit  Virtual Visit Consent  Morgan Taylor, you are scheduled for a virtual visit with a [facility] provider today. Just as with appointments in the office, your consent must be obtained to participate. Your consent will be active for this visit and any virtual visit you may have with one of our providers in the next 365 days. If you have a MyChart account, a copy of this consent can be sent to you electronically.  By engaging in this virtual visit, you consent to the provision of healthcare and authorize for your insurance to be billed (if applicable) for the services provided during this visit. Depending on your insurance coverage, you may receive a charge related to this service.  I need to obtain your verbal consent now. Are you willing to proceed with your visit today? Morgan Taylor has provided verbal consent on 08/08/2024 for a virtual visit. Morgan Kitty, FNP  Date: 08/08/2024 3:29 PM  Virtual Visit via Video Note   I, Morgan Taylor, connected with  Morgan Taylor  ([id number], 1961/12/18) on 08/08/24 at  1:00 PM EDT by a video-enabled telemedicine application and verified that I am speaking with the correct person using two identifiers.  This is an initial visit to discuss the opportunity to become a primary care patient at [facility] The patient understands that if their medical background is complex, their case will be reviewed with the Medical Director, and if Virtual Primary Care is not the ideal location for their care, our team will help establish the patient with a primary care provider in the area.   If this is determined that this location is not the best option for the patient, in the future if the patient's medical condition changes we can re explore the option of this location serving as their primary care location.  The patient understands that by becoming a primary care patient, this would be the location for their primary care, and if they  chose to leave this location and seek primary care services at another location they will not be able to continue their relationship with this clinic.    Location: Patient: Virtual Visit Location Patient: VPC visit location: Other/IRC Provider: Virtual Visit Location Provider: Home Office   History of Present Illness: Morgan Taylor is a 63 y.o. who identifies as a female who was assigned female at birth, and is being seen today as a new patient with the [facility].   She has been suffering from lack of follow up due to confusion between Medicare and Medicaid  And out of pocket cost with providers.  She has had a primary care in the past, has been a patient with endocrinology and well.   She is currently taking Amoxicillin  for strep throat  She has been suffering from vaginal yeast symptoms since starting that   She has a long history of type 2 Diabetes. She was on Lantus  and Invokana in the past.  Has not used insulin  in 6 months   She was using her thyroid medicine up until 2 weeks ago when she ran out  No recent labs for thyroid or cholesterol on file   She is currently homeless and working on housing, is possibly considering moving to [place] [employer] area. She has not been able to refrigerate medications in the past  She has lost her glucose monitor and supplies     Do you have a current Primary Care Provider? Yes Has  not been seen recently due to lack of transportation  Have you seen a Primary Care Provider in the past? Yes Do you live in [facility]? No Do you live in the [facility]? Yes  Do you have a history of Diabetes No Do you have a history of HTN Yes Do you have a history of high cholesterol? Yes Have you been diagnosed with kidney disease or kidney failure in the past? No Have you been diagnosed with an autoimmune disease? No Have you been diagnosed with sleep apnea? No Have you been diagnosed with Asthma or COPD? No Do you have a  history of tobacco use? Yes Quit over 20 years ago   Have you ever suffered a stroke or TIA? No Have you ever had a seizure? No Have you been diagnosed with ADHD? Yes Have you been diagnosed with anxiety or depression? 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   Primary localized osteoarthrosis of right shoulder 03/21/2017   S/P shoulder replacement 02/19/2016   Genetic testing 12/11/2015   primary Osteoarthritis of left knee 01/23/2015   Osteoarthritis of right knee 08/15/2014   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) 01/23/2012   HYPERGLYCEMIA 05/03/2007   HYPERPLASIA, ENDOMETRIAL NOS 02/22/2007   DYSMENORRHEA 10/18/2006   DEGENERATIVE JOINT DISEASE 10/18/2006   INSOMNIA 10/18/2006   CHEST PAIN 10/18/2006   OBESITY 10/05/2006   ANXIETY 10/05/2006   HYPERTENSION 10/05/2006   GASTROENTERITIS 10/05/2006   MENORRHAGIA, PERIMENOPAUSAL 10/05/2006    Allergies:  Allergies  Allergen Reactions   Pregabalin  Swelling    Hands, legs, feet (per pt tolerating as of 11/15/23)   Sulfonamide Derivatives Hives   Vortioxetine Nausea And Vomiting   Medications:  Current Outpatient Medications:    fluconazole  (DIFLUCAN ) 150 MG tablet, Take 1 tablet (150 mg total) by mouth once for 1 dose., Disp: 1 tablet, Rfl: 0   ALPRAZolam  (XANAX ) 1 MG tablet, Take 1 mg by mouth daily as needed., Disp: , Rfl:    ALPRAZolam  (XANAX ) 1 MG tablet, Take 1 tablet (1 mg total) by mouth 2 (two) times daily as needed for severe anxiety/panic attacks., Disp: 60 tablet, Rfl: 2   amLODipine  (NORVASC ) 10 MG tablet, Take 1 tablet (10 mg total) by mouth daily., Disp: 90 tablet, Rfl: 1   amoxicillin  (AMOXIL ) 500 MG  capsule, Take 2 capsules (1,000 mg total) by mouth daily for 9 days., Disp: 18 capsule, Rfl: 0   amoxicillin -clavulanate (AUGMENTIN ) 875-125 MG tablet, Take 1 tablet by mouth 2 (two) times daily. One po bid x 7 days, Disp: 14 tablet, Rfl: 0   amphetamine -dextroamphetamine  (ADDERALL) 15 MG tablet, Take 1 tablet by mouth 2 (two) times daily., Disp: , Rfl:    amphetamine -dextroamphetamine  (ADDERALL) 15 MG tablet, Take 1 tablet by mouth 2 (two) times daily for ADHD., Disp: 60 tablet, Rfl: 0   amphetamine -dextroamphetamine  (ADDERALL) 15 MG tablet, Take 1 tablet by mouth 2 (two) times daily for ADHD., Disp: 60 tablet, Rfl: 0   amphetamine -dextroamphetamine  (ADDERALL) 15 MG tablet, Take 1 tablet by mouth 2 (two) times daily for ADHD. *05/13, Disp: 60 tablet, Rfl: 0   atorvastatin  (LIPITOR) 20 MG tablet, Take 1 tablet (  20 mg total) by mouth daily., Disp: 90 tablet, Rfl: 0   Biotin  89999 MCG TABS, Take 10,000 mcg by mouth daily., Disp: , Rfl:    Blood Glucose Monitoring Suppl (ACCU-CHEK GUIDE) w/Device KIT, Use as directed to check blood sugar up to 3 times per day., Disp: 1 kit, Rfl: 0   cephALEXin  (KEFLEX ) 500 MG capsule, Take 1 capsule (500 mg total) by mouth 3 (three) times daily for 10 days., Disp: 30 capsule, Rfl: 0   Cholecalciferol (VITAMIN D ) 125 MCG (5000 UT) CAPS, Take 5,000 Units by mouth daily., Disp: , Rfl:    diclofenac  (VOLTAREN ) 75 MG EC tablet, Take 1 tablet (75 mg total) by mouth daily., Disp: 30 tablet, Rfl: 0   ergocalciferol  (VITAMIN D2) 1.25 MG (50000 UT) capsule, Take 1 capsule (50,000 Units total) by mouth once a week., Disp: 12 capsule, Rfl: 1   glucose blood (ACCU-CHEK GUIDE TEST) test strip, Check blood glucose 3 times daily., Disp: 300 strip, Rfl: 3   glucose blood test strip, Use 1 strip daily as directed., Disp: 100 strip, Rfl: 3   ibuprofen  (ADVIL ) 600 MG tablet, Take 1 tablet (600 mg total) by mouth every 6 (six) hours as needed., Disp: 30 tablet, Rfl: 0   insulin  glargine  (LANTUS  SOLOSTAR) 100 UNIT/ML Solostar Pen, Inject 10 Units into the skin daily., Disp: 15 mL, Rfl: 11   Insulin  Pen Needle (TRUEPLUS 5-BEVEL PEN NEEDLES) 32G X 4 MM MISC, To use with Lantus  once daily, Disp: 120 each, Rfl: 11   INVOKANA 300 MG TABS tablet, Take 300 mg by mouth every morning., Disp: , Rfl:    levothyroxine  (SYNTHROID ) 75 MCG tablet, Take 1 tablet (75 mcg total) by mouth daily before breakfast., Disp: 30 tablet, Rfl: 1   losartan  (COZAAR ) 100 MG tablet, Take 1 tablet (100 mg total) by mouth daily., Disp: 30 tablet, Rfl: 1   methocarbamol  (ROBAXIN ) 500 MG tablet, Take 1 tablet (500 mg total) by mouth 2 (two) times daily., Disp: 20 tablet, Rfl: 0   naloxone (NARCAN) nasal spray 4 mg/0.1 mL, Place 1 spray into the nose Once PRN. (Patient not taking: Reported on 02/13/2022), Disp: , Rfl:    omeprazole  (PRILOSEC) 20 MG capsule, Take 1 capsule (20 mg total) by mouth daily., Disp: 90 capsule, Rfl: 1   oxyCODONE -acetaminophen  (PERCOCET) 10-325 MG tablet, Take 1 tablet by mouth 5 (five) times daily as needed for pain., Disp: , Rfl:    oxyCODONE -acetaminophen  (PERCOCET) 10-325 MG tablet, Take 1 tablet by mouth 5 (five) times daily as needed for pain., Disp: 150 tablet, Rfl: 0   oxyCODONE -acetaminophen  (PERCOCET) 10-325 MG tablet, Take 1 tablet by mouth 5 (five) times daily as needed for pain, Disp: 150 tablet, Rfl: 0   oxyCODONE -acetaminophen  (PERCOCET) 10-325 MG tablet, Take 1 tablet by mouth 5 (five) times daily as needed for pain, Disp: 150 tablet, Rfl: 0   oxyCODONE -acetaminophen  (PERCOCET) 10-325 MG tablet, Take 1 tablet by mouth 5 (five) times daily as needed for pain., Disp: 150 tablet, Rfl: 0   oxyCODONE -acetaminophen  (PERCOCET) 10-325 MG tablet, Take 1 tablet by mouth 5 (five) times daily as needed for pain., Disp: 35 tablet, Rfl: 0   oxyCODONE -acetaminophen  (PERCOCET) 10-325 MG tablet, Take 1 tablet by mouth 5 (five) times daily as needed for pain., Disp: 35 tablet, Rfl: 0    oxyCODONE -acetaminophen  (PERCOCET) 10-325 MG tablet, Take 1 tablet by mouth every 6 (six) hours as needed for severe pain, Disp: 60 tablet, Rfl: 0   phentermine  15  MG capsule, Take 1 capsule (15 mg total) by mouth daily., Disp: 30 capsule, Rfl: 0   phentermine  15 MG capsule, Take 1 capsule (15 mg total) by mouth daily., Disp: 30 capsule, Rfl: 1   pregabalin  (LYRICA ) 300 MG capsule, Take 300 mg by mouth 2 (two) times daily., Disp: , Rfl:    pregabalin  (LYRICA ) 300 MG capsule, Take 1 capsule (300 mg total) by mouth 2 (two) times daily for neuropathy, Disp: 60 capsule, Rfl: 0   pregabalin  (LYRICA ) 300 MG capsule, Take 1 capsule (300 mg total) by mouth 2 (two) times daily for neuropathy., Disp: 60 capsule, Rfl: 0   pregabalin  (LYRICA ) 300 MG capsule, Take 1 capsule (300 mg total) by mouth 2 (two) times daily for neuropathy., Disp: 14 capsule, Rfl: 0   pregabalin  (LYRICA ) 300 MG capsule, Take 1 capsule (300 mg total) by mouth 2 (two) times daily for neuropathy., Disp: 14 capsule, Rfl: 0   Semaglutide , 2 MG/DOSE, (OZEMPIC , 2 MG/DOSE,) 8 MG/3ML SOPN, Inject 2 mg into the skin once a week., Disp: 3 mL, Rfl: 0   sennosides-docusate sodium  (SENOKOT-S) 8.6-50 MG tablet, Take 2 tablets by mouth daily. (Patient not taking: Reported on 02/13/2022), Disp: 30 tablet, Rfl: 1   sertraline  (ZOLOFT ) 100 MG tablet, Take 1 tablet (100 mg total) by mouth daily for anxiety/depression., Disp: 90 tablet, Rfl: 0   sertraline  (ZOLOFT ) 50 MG tablet, Take 50 mg by mouth daily., Disp: , Rfl:    tiZANidine  (ZANAFLEX ) 4 MG tablet, Take 4 mg by mouth daily as needed for muscle spasms., Disp: , Rfl:    tiZANidine  (ZANAFLEX ) 4 MG tablet, Take 1 tablet (4 mg total) by mouth 3 (three) times daily as needed for muscle spasms, Disp: 90 tablet, Rfl: 0   tiZANidine  (ZANAFLEX ) 4 MG tablet, Take 1 tablet (4 mg total) by mouth 3 (three) times daily as needed for muscle spasms. Separate from opioids by 1-2 hours to avoid oversedation., Disp: 90  tablet, Rfl: 0   tiZANidine  (ZANAFLEX ) 4 MG tablet, Take 1 tablet (4 mg total) by mouth 3 (three) times daily as needed for muscle spasms. Separate from opioids by 1-2 hours to avoid over sedation., Disp: 21 tablet, Rfl: 0   tiZANidine  (ZANAFLEX ) 4 MG tablet, Take 1 tablet (4 mg total) by mouth 3 (three) times daily as needed for muscle spasms. Separate from opioids by 1-2 hours to avoid oversedation., Disp: 21 tablet, Rfl: 0   valACYclovir  (VALTREX ) 500 MG tablet, Take 500 mg by mouth daily. (Patient not taking: Reported on 02/13/2022), Disp: , Rfl:   Observations/Objective: Physical Exam Constitutional:      Appearance: Normal appearance.  HENT:     Head: Normocephalic.     Nose: Nose normal.  Neurological:     Mental Status: She is alert.     Today's Vitals   08/08/24 1344  BP: (!) 184/95  178/88 on recheck ( off medications)   There is no height or weight on file to calculate BMI.   Assessment and Plan:  1. Essential hypertension  - amLODipine  (NORVASC ) 10 MG tablet; Take 1 tablet (10 mg total) by mouth daily.  Dispense: 90 tablet; Refill: 1 - losartan  (COZAAR ) 100 MG tablet; Take 1 tablet (100 mg total) by mouth daily.  Dispense: 30 tablet; Refill: 1 - AMB Referral [facility] Care Management  2. Chronic right-sided low back pain, unspecified whether sciatica present (Primary)  - diclofenac  (VOLTAREN ) 75 MG EC tablet; Take 1 tablet (75 mg total) by mouth daily.  Dispense: 30 tablet; Refill: 0  3. Diabetes mellitus without complication (HCC)  - Blood Glucose Monitoring Suppl (ACCU-CHEK GUIDE) w/Device KIT; Use as directed to check blood sugar up to 3 times per day.  Dispense: 1 kit; Refill: 0 - glucose blood (ACCU-CHEK GUIDE TEST) test strip; Check blood glucose 3 times daily.  Dispense: 300 strip; Refill: 3 - AMB Referral [facility] Care Management  4. Gastroesophageal reflux disease without esophagitis  - Omeprazole  20 MG TBEC; Take 1 tablet (20 mg total) by mouth daily.   Dispense: 90 tablet; Refill: 1  5. Hyperlipidemia, unspecified hyperlipidemia type  - atorvastatin  (LIPITOR) 20 MG tablet; Take 1 tablet (20 mg total) by mouth daily.  Dispense: 90 tablet; Refill: 0 - AMB Referral [facility] Care Management    Needs Rx for hearing aid (will bring heading test Monday)   Assistance with DM2 management and homelessness   Labs Monday  CHW Monday    Follow Up Instructions: I discussed the assessment and treatment plan with the patient. The Telepresenter provided patient with a physical copy of my written instructions for review.   The patient was advised to call back or seek an in-person evaluation if the symptoms worsen or if the condition fails to improve as anticipated.    Morgan Kitty, FNP  **Disclaimer: This note may have been dictated with voice recognition software. Similar sounding words can inadvertently be transcribed and this note may contain transcription errors which may not have been corrected upon publication of note.**

## 2024-08-08 NOTE — Congregational Nurse Program (Signed)
  Dept: [phone]   Congregational Nurse Program Note  Date of Encounter: 08/08/2024  Past Medical History: Past Medical History:  Diagnosis Date   Anxiety    Arthritis    Breast cancer (HCC)    Cancer (HCC) 2009   lumpectomy   Depression    takes Remeron  nightly   Diabetes mellitus    takes Lantus  and Victoza . Average fasting blood sugar runs 89   GERD (gastroesophageal reflux disease)    takes Omeprazole  daily   Hyperlipemia    takes Lipitor daily   Hypertension    takes Amlodipine  and Losartan  daily   Hypothyroidism    takes Synthroid  daily   Neck pain    Numbness    in both hands   Personal history of radiation therapy    Primary localized osteoarthrosis of right shoulder 03/21/2017    Encounter Details:  Community Questionnaire - 08/08/24 1301       Questionnaire   Ask client: Do you give verbal consent for me to treat you today? Yes    Student Assistance N/A    Location Patient Served  [facility]    Encounter Setting CN site    Population Status Unhoused    Insurance Medicaid    Insurance/Financial Assistance Referral N/A    Medication N/A    Medical Provider Yes    Screening Referrals Made N/A    Medical Referrals Made [facility]    Medical Appointment Completed [facility]    CNP Interventions Advocate/Support;Navigate Healthcare System;Case Management;Counsel;Educate    Screenings CN Performed Blood Pressure    ED Visit Averted Yes    Life-Saving Intervention Made N/A           BP (!) 178/88 (BP Location: Right Arm, Patient Position: Sitting, Cuff Size: Normal)   Pulse 65   SpO2 98%    RN received referral from Case Management from [facility]. Client is need of medication and requesting assistance in getting medication. She states she has PCP but they would not see her due to insurance issues, she also states that she does not want to return to them. Requesting RN assist with PCP. RN able to get same day appointment with virtual  primary care to assist client and get assistance medication needs.   RN assisted during VPC appointment in RN office. Client requested prescription for hearing aid and NP made aware. VS checked and CBG- 345, NP aware and medication ordered and client will pick up after visit completed. Follow up appointment made for Monday at [facility] office. Client has no needs from RN for transportation and medication pick up. RN will look into helping client with diabetic education and helping with CGM monitor. RN will follow up with client next Thursday.

## 2024-08-09 ENCOUNTER — Other Ambulatory Visit (HOSPITAL_COMMUNITY): Payer: Self-pay

## 2024-08-09 ENCOUNTER — Encounter (HOSPITAL_COMMUNITY): Payer: Self-pay

## 2024-08-09 ENCOUNTER — Other Ambulatory Visit: Payer: Self-pay

## 2024-08-09 ENCOUNTER — Ambulatory Visit (HOSPITAL_COMMUNITY)
Admission: EM | Admit: 2024-08-09 | Discharge: 2024-08-09 | Disposition: A | Discharge status: Home / Self Care | Attending: Emergency Medicine | Admitting: Emergency Medicine

## 2024-08-09 ENCOUNTER — Other Ambulatory Visit (HOSPITAL_BASED_OUTPATIENT_CLINIC_OR_DEPARTMENT_OTHER): Payer: Self-pay

## 2024-08-09 DIAGNOSIS — B9789 Other viral agents as the cause of diseases classified elsewhere: Secondary | ICD-10-CM

## 2024-08-09 DIAGNOSIS — R051 Acute cough: Secondary | ICD-10-CM

## 2024-08-09 DIAGNOSIS — J988 Other specified respiratory disorders: Secondary | ICD-10-CM | POA: Diagnosis not present

## 2024-08-09 LAB — POC SARS CORONAVIRUS 2 AG -  ED: SARS Coronavirus 2 Ag: NEGATIVE

## 2024-08-09 MED ORDER — BENZONATATE 100 MG PO CAPS
100.0000 mg | ORAL_CAPSULE | Freq: Three times a day (TID) | ORAL | 0 refills | Status: DC
  Filled 2024-08-09 (×2): qty 21, 7d supply, fill #0

## 2024-08-09 MED ORDER — PROMETHAZINE-DM 6.25-15 MG/5ML PO SYRP
5.0000 mL | ORAL_SOLUTION | Freq: Every evening | ORAL | 0 refills | Status: DC | PRN
  Filled 2024-08-09 (×2): qty 118, 23d supply, fill #0

## 2024-08-09 MED ORDER — AZELASTINE HCL 0.1 % NA SOLN
2.0000 | Freq: Two times a day (BID) | NASAL | 0 refills | Status: AC
Start: 2024-08-09 — End: ?
  Filled 2024-08-09: qty 30, 50d supply, fill #0
  Filled 2024-08-09: qty 30, 25d supply, fill #0

## 2024-08-09 NOTE — ED Provider Notes (Signed)
 [facility]    CSN: [id number] Arrival date & time: 08/09/24  9056      History   Chief Complaint Chief Complaint  Patient presents with   Cough    HPI Morgan Taylor is a 63 y.o. female.   Patient presents with dry cough that began yesterday.  Patient states that at times she coughs so much that it will take her breath away or she becomes lightheaded.  Patient states that today she began to have some nasal congestion with this as well.  Denies chest pain, shortness of breath, fever, body aches, chills, nausea, vomiting, diarrhea, abdominal pain.  Patient states that she is currently on amoxicillin  for treatment of strep throat that she was diagnosed with in the ER last week.  Patient states that her symptoms related to this have since resolved.  Patient states that she has been taking over-the-counter cough syrup with minimal relief.  Denies history of asthma or COPD.  The history is provided by the patient and medical records.  Cough   Past Medical History:  Diagnosis Date   Anxiety    Arthritis    Breast cancer (HCC)    Cancer (HCC) 2009   lumpectomy   Depression    takes Remeron  nightly   Diabetes mellitus    takes Lantus  and Victoza . Average fasting blood sugar runs 89   GERD (gastroesophageal reflux disease)    takes Omeprazole  daily   Hyperlipemia    takes Lipitor daily   Hypertension    takes Amlodipine  and Losartan  daily   Hypothyroidism    takes Synthroid  daily   Neck pain    Numbness    in both hands   Personal history of radiation therapy    Primary localized osteoarthrosis of right shoulder 03/21/2017    Patient Active Problem List   Diagnosis Date Noted   S/P hip replacement, left 11/10/2020   Primary localized osteoarthrosis of right shoulder 03/21/2017   S/P shoulder replacement 02/19/2016   Genetic testing 12/11/2015   primary Osteoarthritis of left knee 01/23/2015   Osteoarthritis of right knee 08/15/2014   Primary cancer of lower  outer quadrant of left female breast (HCC) 01/23/2012   HYPERGLYCEMIA 05/03/2007   HYPERPLASIA, ENDOMETRIAL NOS 02/22/2007   DYSMENORRHEA 10/18/2006   DEGENERATIVE JOINT DISEASE 10/18/2006   INSOMNIA 10/18/2006   CHEST PAIN 10/18/2006   OBESITY 10/05/2006   ANXIETY 10/05/2006   HYPERTENSION 10/05/2006   GASTROENTERITIS 10/05/2006   MENORRHAGIA, PERIMENOPAUSAL 10/05/2006    Past Surgical History:  Procedure Laterality Date   APPENDECTOMY     BREAST BIOPSY     BREAST LUMPECTOMY Left 2009   CESAREAN SECTION     COLONOSCOPY     DILATION AND CURETTAGE OF UTERUS     SHOULDER ARTHROSCOPY Left    cleaned out joint   TOTAL HIP ARTHROPLASTY Left 11/10/2020   Procedure: TOTAL HIP ARTHROPLASTY;  Surgeon: Josefina Chew, MD;  Location: [facility];  Service: Orthopedics;  Laterality: Left;   TOTAL KNEE ARTHROPLASTY Right 08/15/2014   Procedure: RIGHT TOTAL KNEE ARTHROPLASTY;  Surgeon: LELON JONETTA Shari Mickey., MD;  Location: [facility];  Service: Orthopedics;  Laterality: Right;   TOTAL KNEE ARTHROPLASTY Left 01/23/2015   Procedure: TOTAL KNEE ARTHROPLASTY;  Surgeon: LELON JONETTA Shari Mickey., MD;  Location: [facility];  Service: Orthopedics;  Laterality: Left;   TOTAL SHOULDER ARTHROPLASTY Left 02/19/2016   Procedure: LEFT TOTAL SHOULDER ARTHROPLASTY;  Surgeon: Chew Josefina, MD;  Location: [facility];  Service: Orthopedics;  Laterality: Left;   TOTAL SHOULDER ARTHROPLASTY Right 03/21/2017   Procedure: TOTAL SHOULDER ARTHROPLASTY;  Surgeon: Fonda Olmsted, MD;  Location: [facility];  Service: Orthopedics;  Laterality: Right;    OB History   No obstetric history on file.      Home Medications    Prior to Admission medications   Medication Sig Start Date End Date Taking? Authorizing Provider  amLODipine  (NORVASC ) 10 MG tablet Take 1 tablet (10 mg total) by mouth daily. 08/08/24  Yes Kennyth Domino, FNP  amoxicillin -clavulanate (AUGMENTIN ) 875-125 MG tablet Take 1 tablet by mouth 2 (two) times daily. One po bid x 7 days 02/13/22  Yes  Lorriane Holmes, MD  atorvastatin  (LIPITOR) 20 MG tablet Take 1 tablet (20 mg total) by mouth daily. 08/08/24  Yes Kennyth Domino, FNP  azelastine  (ASTELIN ) 0.1 % nasal spray Place 2 sprays into both nostrils 2 (two) times daily. Use in each nostril as directed 08/09/24  Yes Johnie Flaming A, NP  benzonatate  (TESSALON ) 100 MG capsule Take 1 capsule (100 mg total) by mouth every 8 (eight) hours. 08/09/24  Yes Johnie Flaming A, NP  Blood Glucose Monitoring Suppl (ACCU-CHEK GUIDE) w/Device KIT Use as directed to check blood sugar up to 3 times per day. 08/08/24  Yes Kennyth Domino, FNP  diclofenac  (VOLTAREN ) 75 MG EC tablet Take 1 tablet (75 mg total) by mouth daily. 08/08/24  Yes Kennyth Domino, FNP  fluconazole  (DIFLUCAN ) 150 MG tablet Take 1 tablet (150 mg total) by mouth once for 1 dose. 08/08/24 08/09/24 Yes Kennyth Domino, FNP  glucose blood (ACCU-CHEK GUIDE TEST) test strip Check blood glucose 3 times daily. 08/08/24  Yes Kennyth Domino, FNP  glucose blood test strip Use 1 strip daily as directed. 07/28/23  Yes   ibuprofen  (ADVIL ) 600 MG tablet Take 1 tablet (600 mg total) by mouth every 6 (six) hours as needed. 06/20/23  Yes Dean Clarity, MD  Insulin  Pen Needle (TRUEPLUS 5-BEVEL PEN NEEDLES) 32G X 4 MM MISC To use with Lantus  once daily 03/18/24  Yes Mayers, Cari S, PA-C  losartan  (COZAAR ) 100 MG tablet Take 1 tablet (100 mg total) by mouth daily. 08/08/24  Yes Kennyth Domino, FNP  omeprazole  (PRILOSEC) 20 MG capsule Take 1 capsule (20 mg total) by mouth daily. 08/08/24  Yes Kennyth Domino, FNP  promethazine -dextromethorphan (PROMETHAZINE -DM) 6.25-15 MG/5ML syrup Take 5 mLs by mouth at bedtime as needed for cough. 08/09/24  Yes Johnie, Daemon Dowty A, NP  sennosides-docusate sodium  (SENOKOT-S) 8.6-50 MG tablet Take 2 tablets by mouth daily. 11/10/20  Yes Brown, Blaine K, PA-C  sertraline  (ZOLOFT ) 100 MG tablet Take 1 tablet (100 mg total) by mouth daily for anxiety/depression. 04/10/24  Yes   sertraline  (ZOLOFT ) 50  MG tablet Take 50 mg by mouth daily. 02/06/22  Yes [provider]  ALPRAZolam  (XANAX ) 1 MG tablet Take 1 mg by mouth daily as needed. 02/17/24   [provider]  ALPRAZolam  (XANAX ) 1 MG tablet Take 1 tablet (1 mg total) by mouth 2 (two) times daily as needed for severe anxiety/panic attacks. 04/10/24       Family History Family History  Problem Relation Age of Onset   Cancer Paternal Grandmother        NOS   Pancreatic cancer Paternal Aunt    Cancer Paternal Aunt        NOS   Breast cancer Mother 71       Stage 3   Colon cancer Neg Hx    Rectal cancer Neg Hx  Stomach cancer Neg Hx     Social History Social History   Tobacco Use   Smoking status: Former   Smokeless tobacco: Never   Tobacco comments:    quit smoking 14 yrs ago  Vaping Use   Vaping status: Never Used  Substance Use Topics   Alcohol use: No   Drug use: No     Allergies   Pregabalin , Sulfonamide derivatives, and Vortioxetine   Review of Systems Review of Systems  Respiratory:  Positive for cough.    Per HPI  Physical Exam Triage Vital Signs ED Triage Vitals  Encounter Vitals Group     BP 08/09/24 1036 (!) 144/79     Girls Systolic BP Percentile --      Girls Diastolic BP Percentile --      Boys Systolic BP Percentile --      Boys Diastolic BP Percentile --      Pulse Rate 08/09/24 1036 69     Resp 08/09/24 1036 18     Temp 08/09/24 1036 98.3 F (36.8 C)     Temp Source 08/09/24 1036 Oral     SpO2 08/09/24 1036 96 %     Weight --      Height --      Head Circumference --      Peak Flow --      Pain Score 08/09/24 1033 0     Pain Loc --      Pain Education --      Exclude from Growth Chart --    No data found.  Updated Vital Signs BP (!) 144/79 (BP Location: Right Arm)   Pulse 69   Temp 98.3 F (36.8 C) (Oral)   Resp 18   SpO2 96%   Visual Acuity Right Eye Distance:   Left Eye Distance:   Bilateral Distance:    Right Eye Near:   Left Eye Near:     Bilateral Near:     Physical Exam Vitals and nursing note reviewed.  Constitutional:      General: She is awake. She is not in acute distress.    Appearance: Normal appearance. She is well-developed and well-groomed. She is not ill-appearing.  HENT:     Right Ear: Tympanic membrane, ear canal and external ear normal.     Left Ear: Tympanic membrane, ear canal and external ear normal.     Nose: Congestion and rhinorrhea present.     Mouth/Throat:     Mouth: Mucous membranes are moist.     Pharynx: Posterior oropharyngeal erythema and postnasal drip present. No oropharyngeal exudate.  Cardiovascular:     Rate and Rhythm: Normal rate and regular rhythm.  Pulmonary:     Effort: Pulmonary effort is normal.     Breath sounds: Normal breath sounds.  Skin:    General: Skin is warm and dry.  Neurological:     Mental Status: She is alert.  Psychiatric:        Behavior: Behavior is cooperative.      UC Treatments / Results  Labs (all labs ordered are listed, but only abnormal results are displayed) Labs Reviewed  POC SARS CORONAVIRUS 2 AG -  ED - Normal    EKG   Radiology No results found.  Procedures Procedures (including critical care time)  Medications Ordered in UC Medications - No data to display  Initial Impression / Assessment and Plan / UC Course  I have reviewed the triage vital signs and the nursing notes.  Pertinent labs & imaging results that were available during my care of the patient were reviewed by me and considered in my medical decision making (see chart for details).     Patient is overall well-appearing.  Vitals are stable.  Congestion and rhinorrhea present on exam, mild erythema and PND noted to posterior oropharynx.  Lungs clear bilaterally to auscultation.  COVID testing negative.  Symptoms likely viral in nature.  Prescribed Tessalon  and Promethazine  DM as needed for cough.  Prescribed azelastine  for nasal congestion.  Discussed  over-the-counter medications to take for symptoms as well.  Discussed follow-up and return precautions. Final Clinical Impressions(s) / UC Diagnoses   Final diagnoses:  Acute cough  Viral respiratory illness     Discharge Instructions      Your COVID test was negative today.  As discussed I believe your symptoms are likely related to a viral respiratory illness. You can take Tessalon  every 8 hours as needed for cough. You can take promethazine  DM cough syrup at bedtime as needed for cough.  This can make you drowsy so do not drive, work, or drink alcohol while taking this. You can also use azelastine  nasal spray twice daily to help with congestion. Otherwise you can take Coricidin which is an over-the-counter medication that is similar to Mucinex that is safe in those with high blood pressure. Make sure you are staying hydrated and getting plenty of rest. Follow-up with your primary care provider or return here as needed.   ED Prescriptions     Medication Sig Dispense Auth. Provider   benzonatate  (TESSALON ) 100 MG capsule Take 1 capsule (100 mg total) by mouth every 8 (eight) hours. 21 capsule Johnie Flaming A, NP   promethazine -dextromethorphan (PROMETHAZINE -DM) 6.25-15 MG/5ML syrup Take 5 mLs by mouth at bedtime as needed for cough. 118 mL Johnie Flaming A, NP   azelastine  (ASTELIN ) 0.1 % nasal spray Place 2 sprays into both nostrils 2 (two) times daily. Use in each nostril as directed 30 mL Johnie Flaming A, NP      PDMP not reviewed this encounter.   Johnie Flaming A, NP 08/09/24 1240

## 2024-08-09 NOTE — ED Triage Notes (Signed)
 Chief Complaint: dry cough. States will have bouts of cough that takes her breath, light headed. Had strep last week  Sick exposure: No  Onset: yesterday  Prescriptions or OTC medications tried: Yes- otc cough syrup   with no relief  New foods, medications, or products: No  Recent Travel: No

## 2024-08-09 NOTE — Discharge Instructions (Signed)
 Your COVID test was negative today.  As discussed I believe your symptoms are likely related to a viral respiratory illness. You can take Tessalon  every 8 hours as needed for cough. You can take promethazine  DM cough syrup at bedtime as needed for cough.  This can make you drowsy so do not drive, work, or drink alcohol while taking this. You can also use azelastine  nasal spray twice daily to help with congestion. Otherwise you can take Coricidin which is an over-the-counter medication that is similar to Mucinex that is safe in those with high blood pressure. Make sure you are staying hydrated and getting plenty of rest. Follow-up with your primary care provider or return here as needed.

## 2024-08-12 ENCOUNTER — Other Ambulatory Visit: Payer: Self-pay

## 2024-08-12 ENCOUNTER — Other Ambulatory Visit (HOSPITAL_COMMUNITY): Payer: Self-pay

## 2024-08-12 ENCOUNTER — Telehealth (INDEPENDENT_AMBULATORY_CARE_PROVIDER_SITE_OTHER): Admitting: Nurse Practitioner

## 2024-08-12 VITALS — BP 150/82 | HR 60 | Ht 62.0 in | Wt 151.0 lb

## 2024-08-12 DIAGNOSIS — Z1159 Encounter for screening for other viral diseases: Secondary | ICD-10-CM | POA: Diagnosis not present

## 2024-08-12 DIAGNOSIS — E782 Mixed hyperlipidemia: Secondary | ICD-10-CM | POA: Diagnosis not present

## 2024-08-12 DIAGNOSIS — Z113 Encounter for screening for infections with a predominantly sexual mode of transmission: Secondary | ICD-10-CM | POA: Diagnosis not present

## 2024-08-12 DIAGNOSIS — Z0000 Encounter for general adult medical examination without abnormal findings: Secondary | ICD-10-CM | POA: Diagnosis not present

## 2024-08-12 DIAGNOSIS — E1165 Type 2 diabetes mellitus with hyperglycemia: Secondary | ICD-10-CM

## 2024-08-12 DIAGNOSIS — B379 Candidiasis, unspecified: Secondary | ICD-10-CM

## 2024-08-12 DIAGNOSIS — Z794 Long term (current) use of insulin: Secondary | ICD-10-CM

## 2024-08-12 DIAGNOSIS — T3695XA Adverse effect of unspecified systemic antibiotic, initial encounter: Secondary | ICD-10-CM

## 2024-08-12 DIAGNOSIS — E119 Type 2 diabetes mellitus without complications: Secondary | ICD-10-CM

## 2024-08-12 DIAGNOSIS — I158 Other secondary hypertension: Secondary | ICD-10-CM | POA: Diagnosis not present

## 2024-08-12 DIAGNOSIS — H9193 Unspecified hearing loss, bilateral: Secondary | ICD-10-CM

## 2024-08-12 DIAGNOSIS — E569 Vitamin deficiency, unspecified: Secondary | ICD-10-CM | POA: Diagnosis not present

## 2024-08-12 MED ORDER — HEARING AID DEVICE DEVI: 2.0000 [IU] | Freq: Every day | 0 refills | Status: AC

## 2024-08-12 MED ORDER — FLUCONAZOLE 150 MG PO TABS
ORAL_TABLET | ORAL | 0 refills | Status: DC
Start: 2024-08-12 — End: 2024-09-13
  Filled 2024-08-12: qty 1, 3d supply, fill #0

## 2024-08-12 MED ORDER — OZEMPIC (0.25 OR 0.5 MG/DOSE) 2 MG/3ML ~~LOC~~ SOPN
0.2500 mg | PEN_INJECTOR | SUBCUTANEOUS | 0 refills | Status: DC
  Filled 2024-08-12: qty 3, 56d supply, fill #0

## 2024-08-12 MED ORDER — FLUCONAZOLE 150 MG PO TABS
ORAL_TABLET | ORAL | 0 refills | Status: DC
  Filled 2024-08-12: qty 1, fill #0

## 2024-08-12 MED ORDER — ACCU-CHEK GUIDE TEST VI STRP
ORAL_STRIP | Freq: Three times a day (TID) | 3 refills | Status: AC
  Filled 2024-08-12: qty 300, 100d supply, fill #0
  Filled 2024-09-18: qty 300, 90d supply, fill #0

## 2024-08-12 MED ORDER — ACCU-CHEK GUIDE W/DEVICE KIT
PACK | 0 refills | Status: AC
  Filled 2024-08-12: qty 1, fill #0
  Filled 2024-08-16 – 2024-09-18 (×3): qty 1, 30d supply, fill #0

## 2024-08-12 MED ORDER — OZEMPIC (0.25 OR 0.5 MG/DOSE) 2 MG/3ML ~~LOC~~ SOPN
0.2500 mg | PEN_INJECTOR | SUBCUTANEOUS | 0 refills | Status: DC
  Filled 2024-08-12: qty 3, fill #0

## 2024-08-12 NOTE — Progress Notes (Addendum)
 Annual Physical Exam   Location: Patient: in office  Provider: In office     Patient: Morgan Taylor   DOB: Feb 07, 1961   63 y.o. Female  MRN: [id number]  Subjective:    No chief complaint on file.   Morgan Taylor is a 63 y.o. female who presents today for a complete physical exam. She reports consuming a general diet. She walks a lot currently because she is un housed. She generally feels fairly well. She reports sleeping fairly well. She does have additional problems to discuss today. She continues on her Amoxicillin  for strep throat and has had ongoing vaginal itching, some improvement with one dose of Diflucan  last week.   She recently completed a hearing test at [facility] and is requesting assistance with a prescription for a heading aid.   She is working on getting permanent housing, she is currently staying [facility] the [facility]. She does have access to a shower and takes good care of her skin and feet.   Her medical history is significant for Breast CA s/p lumpectomy (LOquadrant of Left breast)  She has had bilateral knee replacements, left shoulder and left hip replacements   She did take her blood pressure medicine today, was restarted on both Amlodipine  and Losartan  5 days ago.   She has not been able to get a glucose monitor yet.  Due to being un housed and not having a refrigerator it was decided to hold off on restarting her insulin .   She notes that she believes she was on a GLP-1 in the past and denies any SE   Denies a history of thyroid CA   She does suffer chronic pain, was told at the pain clinic that she would need to be off Xanax  for 2 months prior to entering their program, she has now been off Xanax  for one month.   She has been suffering from viral URI with mild nasal congestion and cough. Visit UC last week and was given cough medication and nasal spray   Most recent fall risk assessment:    03/18/2024    9:30  AM  Fall Risk   Falls in the past year? 0  Number falls in past yr: 0  Injury with Fall? 0  Risk for fall due to : No Fall Risks  Follow up Falls evaluation completed     Most recent depression screenings:  Flowsheet Row Office Visit from 03/18/2024 in [facility]  PHQ-2 Total Score 6      It has been over one year since her last opthalmology exam-   Patient Active Problem List   Diagnosis Date Noted   Cervical radiculopathy 06/14/2024   Pain syndrome, chronic 06/14/2024   S/P hip replacement, left 11/10/2020   Genital herpes simplex 08/29/2018   Referred otalgia of both ears 07/19/2018   Primary localized osteoarthrosis of right shoulder 03/21/2017   S/P shoulder replacement 02/19/2016   Genetic testing 12/11/2015   primary Osteoarthritis of left knee 01/23/2015   Osteoarthritis of right knee 08/15/2014   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) 01/23/2012   History of lumpectomy of left breast 12/27/2007   HYPERGLYCEMIA 05/03/2007   HYPERPLASIA, ENDOMETRIAL NOS 02/22/2007   DYSMENORRHEA  10/18/2006   DEGENERATIVE JOINT DISEASE 10/18/2006   INSOMNIA 10/18/2006   CHEST PAIN 10/18/2006   OBESITY 10/05/2006   ANXIETY 10/05/2006   HYPERTENSION 10/05/2006   GASTROENTERITIS 10/05/2006   MENORRHAGIA, PERIMENOPAUSAL 10/05/2006   Past Medical History:  Diagnosis Date   Anxiety    Arthritis    Breast cancer (HCC)    Cancer (HCC) 2009   lumpectomy   Depression    takes Remeron  nightly   Diabetes mellitus    takes Lantus  and Victoza . Average fasting blood sugar runs 89   GERD (gastroesophageal reflux disease)    takes Omeprazole  daily   Hyperlipemia    takes Lipitor daily   Hypertension    takes Amlodipine  and Losartan  daily   Hypothyroidism    takes Synthroid  daily   Neck pain    Numbness    in both hands   Personal history of radiation therapy    Primary localized osteoarthrosis of right shoulder 03/21/2017   Past Surgical  History:  Procedure Laterality Date   APPENDECTOMY     BREAST BIOPSY     BREAST LUMPECTOMY Left 2009   CESAREAN SECTION     COLONOSCOPY     DILATION AND CURETTAGE OF UTERUS     SHOULDER ARTHROSCOPY Left    cleaned out joint   TOTAL HIP ARTHROPLASTY Left 11/10/2020   Procedure: TOTAL HIP ARTHROPLASTY;  Surgeon: Josefina Chew, MD;  Location: [facility];  Service: Orthopedics;  Laterality: Left;   TOTAL KNEE ARTHROPLASTY Right 08/15/2014   Procedure: RIGHT TOTAL KNEE ARTHROPLASTY;  Surgeon: LELON JONETTA Shari Mickey., MD;  Location: [facility];  Service: Orthopedics;  Laterality: Right;   TOTAL KNEE ARTHROPLASTY Left 01/23/2015   Procedure: TOTAL KNEE ARTHROPLASTY;  Surgeon: LELON JONETTA Shari Mickey., MD;  Location: [facility];  Service: Orthopedics;  Laterality: Left;   TOTAL SHOULDER ARTHROPLASTY Left 02/19/2016   Procedure: LEFT TOTAL SHOULDER ARTHROPLASTY;  Surgeon: Chew Josefina, MD;  Location: [facility];  Service: Orthopedics;  Laterality: Left;   TOTAL SHOULDER ARTHROPLASTY Right 03/21/2017   Procedure: TOTAL SHOULDER ARTHROPLASTY;  Surgeon: Chew Josefina, MD;  Location: [facility];  Service: Orthopedics;  Laterality: Right;   Social History   Tobacco Use   Smoking status: Former   Smokeless tobacco: Never   Tobacco comments:    quit smoking 14 yrs ago  Vaping Use   Vaping status: Never Used  Substance Use Topics   Alcohol use: No   Drug use: No   Allergies  Allergen Reactions   Pregabalin  Swelling    Hands, legs, feet (per pt tolerating as of 11/15/23)   Sulfonamide Derivatives Hives   Vortioxetine Nausea And Vomiting      Patient Care Team: Kennyth Domino, FNP as PCP - General (Nurse Practitioner)   Outpatient Medications Prior to Visit  Medication Sig   ALPRAZolam  (XANAX ) 1 MG tablet Take 1 mg by mouth daily as needed.   ALPRAZolam  (XANAX ) 1 MG tablet Take 1 tablet (1 mg total) by mouth 2 (two) times daily as needed for severe anxiety/panic attacks.   amLODipine  (NORVASC ) 10 MG tablet Take 1 tablet (10 mg  total) by mouth daily.   amoxicillin -clavulanate (AUGMENTIN ) 875-125 MG tablet Take 1 tablet by mouth 2 (two) times daily. One po bid x 7 days   atorvastatin  (LIPITOR) 20 MG tablet Take 1 tablet (20 mg total) by mouth daily.   azelastine  (ASTELIN ) 0.1 % nasal spray Place 2 sprays into both nostrils 2 (two) times daily. Use in each nostril as  directed   benzonatate  (TESSALON ) 100 MG capsule Take 1 capsule (100 mg total) by mouth every 8 (eight) hours.   diclofenac  (VOLTAREN ) 75 MG EC tablet Take 1 tablet (75 mg total) by mouth daily.   glucose blood test strip Use 1 strip daily as directed.   ibuprofen  (ADVIL ) 600 MG tablet Take 1 tablet (600 mg total) by mouth every 6 (six) hours as needed.   Insulin  Pen Needle (TRUEPLUS 5-BEVEL PEN NEEDLES) 32G X 4 MM MISC To use with Lantus  once daily   losartan  (COZAAR ) 100 MG tablet Take 1 tablet (100 mg total) by mouth daily.   omeprazole  (PRILOSEC) 20 MG capsule Take 1 capsule (20 mg total) by mouth daily.   promethazine -dextromethorphan (PROMETHAZINE -DM) 6.25-15 MG/5ML syrup Take 5 mLs by mouth at bedtime as needed for cough.   sennosides-docusate sodium  (SENOKOT-S) 8.6-50 MG tablet Take 2 tablets by mouth daily.   sertraline  (ZOLOFT ) 100 MG tablet Take 1 tablet (100 mg total) by mouth daily for anxiety/depression.   sertraline  (ZOLOFT ) 50 MG tablet Take 50 mg by mouth daily.   [DISCONTINUED] Blood Glucose Monitoring Suppl (ACCU-CHEK GUIDE) w/Device KIT Use as directed to check blood sugar up to 3 times per day.   [DISCONTINUED] glucose blood (ACCU-CHEK GUIDE TEST) test strip Check blood glucose 3 times daily.   No facility-administered medications prior to visit.    Review of Systems  Constitutional: Negative.   HENT:  Positive for congestion and hearing loss.   Eyes: Negative.   Respiratory:  Positive for cough.   Cardiovascular: Negative.   Genitourinary: Negative.   Skin: Negative.        Objective:     BP (!) 150/82   Pulse 60   Ht 5'  2 (1.575 m)   Wt 151 lb (68.5 kg)   SpO2 97%   BMI 27.62 kg/m  BP Readings from Last 3 Encounters:  08/12/24 (!) 150/82  08/09/24 (!) 144/79  08/08/24 (!) 184/95      Physical Exam Constitutional:      General: She is not in acute distress.    Appearance: Normal appearance.  HENT:     Head: Normocephalic.     Right Ear: External ear normal.     Left Ear: External ear normal.     Nose: Rhinorrhea present.     Mouth/Throat:     Mouth: Mucous membranes are moist.  Eyes:     Extraocular Movements: Extraocular movements intact.     Conjunctiva/sclera: Conjunctivae normal.  Cardiovascular:     Rate and Rhythm: Normal rate and regular rhythm.     Heart sounds: Normal heart sounds.  Pulmonary:     Effort: Pulmonary effort is normal.     Breath sounds: Normal breath sounds.  Abdominal:     Palpations: Abdomen is soft.  Musculoskeletal:        General: No swelling. Normal range of motion.     Cervical back: Normal range of motion and neck supple.  Skin:    General: Skin is warm.  Neurological:     General: No focal deficit present.     Mental Status: She is alert.  Psychiatric:        Mood and Affect: Mood normal.      No results found for any visits on 08/12/24.      Assessment & Plan:    Routine Health Maintenance and Physical Exam  Immunization History  Administered Date(s) Administered   Influenza,inj,Quad PF,6+ Mos 10/13/2014   Influenza-Unspecified 09/26/2023  PFIZER(Purple Top)SARS-COV-2 Vaccination 04/04/2020, 04/28/2020   Pneumococcal Polysaccharide-23 08/17/2014   Tdap 02/13/2022    Health Maintenance  Topic Date Due   OPHTHALMOLOGY EXAM  Never done   HIV Screening  Never done   Diabetic kidney evaluation - Urine ACR  Never done   Hepatitis C Screening  Never done   Zoster Vaccines- Shingrix (1 of 2) Never done   Cervical Cancer Screening (HPV/Pap Cotest)  Never done   Pneumococcal Vaccine: 50+ Years (2 of 2 - PCV) 08/18/2015   COVID-19  Vaccine (3 - Pfizer risk series) 05/26/2020   HEMOGLOBIN A1C  05/02/2021   Colonoscopy  06/04/2024   INFLUENZA VACCINE  07/26/2024   Diabetic kidney evaluation - eGFR measurement  01/22/2025   FOOT EXAM  08/12/2025   MAMMOGRAM  03/19/2026   DTaP/Tdap/Td (2 - Td or Tdap) 02/14/2032   Hepatitis B Vaccines 19-59 Average Risk  Aged Out   HPV VACCINES  Aged Out   Meningococcal B Vaccine  Aged Out   Pneumococcal Vaccine  Discontinued    Discussed health benefits of physical activity, and encouraged her to engage in regular exercise appropriate for her age and condition.  Will defer Colonoscopy until she has housing arranged- she is in process.  Breast screening will be due March of 2026 diagnostic based on CA history   Vaccines will defer until she has insurance straightened out, she is having difficulty currently at the pharmacy due to [facility] and Medicare coverage listed as primary.   Pharmacy referral placed for assistance with medications and preferences for diabetic management while she is un housed.  Ozempic  can be kept at room temperature for longer periods than insulin , plan will be to switch to Ozempic  until housing has been acquired/ may continue if therapeutic for patient  ng Aid Device DEVI; 2 Units by Does not apply route daily.  Dispense: 2 Units; Refill: 0   1. Need for hepatitis C screening test (Primary)  2. Screen for STD (sexually transmitted disease)  - HIV Antibody (routine testing w rflx) - Hepatitis C antibody  3. Other secondary hypertension Continue medications and check in with Congregational Nurse Thursday for BP check  Watch sodium intake  Continue exercise routine   - CBC with Differential/Platelet - Comprehensive metabolic panel with GFR - Hemoglobin A1c - Lipid panel - TSH - VITAMIN D  25 Hydroxy (Vit-D Deficiency, Fractures) - Microalbumin / creatinine urine ratio  4. Type 2 diabetes mellitus with hyperglycemia, with long-term current use of  insulin  (HCC)  - CBC with Differential/Platelet - Comprehensive metabolic panel with GFR - Hemoglobin A1c - Lipid panel - TSH - VITAMIN D  25 Hydroxy (Vit-D Deficiency, Fractures) - Microalbumin / creatinine urine ratio - Blood Glucose Monitoring Suppl (ACCU-CHEK GUIDE) w/Device KIT; Use as directed to check blood sugar up to 3 times per day.  Dispense: 1 kit; Refill: 0 - glucose blood (ACCU-CHEK GUIDE TEST) test strip; Check blood glucose 3 times daily.  Dispense: 300 strip; Refill: 3 - Semaglutide ,0.25 or 0.5MG /DOS, (OZEMPIC , 0.25 OR 0.5 MG/DOSE,) 2 MG/3ML SOPN; Inject 0.25 mg into the skin once a week for 4 doses.  Dispense: 3 mL; Refill: 0  5. Mixed hyperlipidemia  - CBC with Differential/Platelet - Comprehensive metabolic panel with GFR - Hemoglobin A1c - Lipid panel - TSH - VITAMIN D  25 Hydroxy (Vit-D Deficiency, Fractures) - Microalbumin / creatinine urine ratio  6. Vitamin deficiency  - VITAMIN D  25 Hydroxy (Vit-D Deficiency, Fractures)  7. Uncontrolled type 2 diabetes mellitus with hyperglycemia (  HCC)  - Blood Glucose Monitoring Suppl (ACCU-CHEK GUIDE) w/Device KIT; Use as directed to check blood sugar up to 3 times per day.  Dispense: 1 kit; Refill: 0 - glucose blood (ACCU-CHEK GUIDE TEST) test strip; Check blood glucose 3 times daily.  Dispense: 300 strip; Refill: 3 - Semaglutide ,0.25 or 0.5MG /DOS, (OZEMPIC , 0.25 OR 0.5 MG/DOSE,) 2 MG/3ML SOPN; Inject 0.25 mg into the skin once a week for 4 doses.  Dispense: 3 mL; Refill: 0 - Ambulatory referral to Ophthalmology  8. Diabetes mellitus without complication (HCC)  - Blood Glucose Monitoring Suppl (ACCU-CHEK GUIDE) w/Device KIT; Use as directed to check blood sugar up to 3 times per day.  Dispense: 1 kit; Refill: 0 - glucose blood (ACCU-CHEK GUIDE TEST) test strip; Check blood glucose 3 times daily.  Dispense: 300 strip; Refill: 3 - Ambulatory referral to Ophthalmology  9. Antibiotic-induced yeast infection  -  fluconazole  (DIFLUCAN ) 150 MG tablet; Take one tablet today may repeat after 72 hours if needed  Dispense: 1 tablet; Refill: 0  10. Bilateral hearing loss, unspecified hearing loss type  - Hearing Aid Device DEVI; 2 Units by Does not apply route daily.  Dispense: 2 Units; Refill: 0     Will follow up with lab results when available  Patient is provided phone number for pharmacy and ophthalmology   RTC in 4 weeks to evaluate Ozempic  titration, encouraged to check in with Congregational Nurse prior for BP/Weight and Glucose checks    [name], FNP  **Disclaimer: This note may have been dictated with voice recognition software. Similar sounding words can inadvertently be transcribed and this note may contain transcription errors which may not have been corrected upon publication of note.**

## 2024-08-12 NOTE — Progress Notes (Signed)
 The patient presented for a primary care visit on 08/12/2024. Blood pressure screening was conducted, and the result was 150/82. During the appointment, the patient reported food, housing, transportation. As a precaution, the patient was provided with food, housing, transportation SDOH resources via email, chart review shows that the pt is also connected to Congregational Nurse Raven at [facility] for additional support.   A review of the patient's chart revealed that they do have a PCP [name], FNP) and no future appointments were indicated.  An additional follow up will be done according to the health equity team's protocol.

## 2024-08-12 NOTE — Patient Instructions (Addendum)
 Pharmacy for pick up or delivery  [phone]   Eye exam referral placed Dr. Octavia  [phone]

## 2024-08-14 LAB — CBC WITH DIFFERENTIAL/PLATELET
Basophils Absolute: 0 x10E3/uL (ref 0.0–0.2)
Basos: 1 %
EOS (ABSOLUTE): 0.1 x10E3/uL (ref 0.0–0.4)
Eos: 2 %
Hematocrit: 43.5 % (ref 34.0–46.6)
Hemoglobin: 13.4 g/dL (ref 11.1–15.9)
Immature Grans (Abs): 0 x10E3/uL (ref 0.0–0.1)
Immature Granulocytes: 0 %
Lymphocytes Absolute: 2.1 x10E3/uL (ref 0.7–3.1)
Lymphs: 27 %
MCH: 29.3 pg (ref 26.6–33.0)
MCHC: 30.8 g/dL — ABNORMAL LOW (ref 31.5–35.7)
MCV: 95 fL (ref 79–97)
Monocytes Absolute: 0.5 x10E3/uL (ref 0.1–0.9)
Monocytes: 6 %
Neutrophils Absolute: 4.8 x10E3/uL (ref 1.4–7.0)
Neutrophils: 64 %
Platelets: 263 x10E3/uL (ref 150–450)
RBC: 4.58 x10E6/uL (ref 3.77–5.28)
RDW: 12.6 % (ref 11.7–15.4)
WBC: 7.5 x10E3/uL (ref 3.4–10.8)

## 2024-08-14 LAB — COMPREHENSIVE METABOLIC PANEL WITH GFR
ALT: 12 IU/L (ref 0–32)
AST: 10 IU/L (ref 0–40)
Albumin: 4 g/dL (ref 3.9–4.9)
Alkaline Phosphatase: 90 IU/L (ref 44–121)
BUN/Creatinine Ratio: 24 (ref 12–28)
BUN: 15 mg/dL (ref 8–27)
Bilirubin Total: 0.5 mg/dL (ref 0.0–1.2)
CO2: 23 mmol/L (ref 20–29)
Calcium: 9.4 mg/dL (ref 8.7–10.3)
Chloride: 99 mmol/L (ref 96–106)
Creatinine, Ser: 0.62 mg/dL (ref 0.57–1.00)
Globulin, Total: 2.8 g/dL (ref 1.5–4.5)
Glucose: 222 mg/dL — ABNORMAL HIGH (ref 70–99)
Potassium: 4.1 mmol/L (ref 3.5–5.2)
Sodium: 139 mmol/L (ref 134–144)
Total Protein: 6.8 g/dL (ref 6.0–8.5)
eGFR: 101 mL/min/1.73 (ref >59)

## 2024-08-14 LAB — MICROALBUMIN / CREATININE URINE RATIO
Creatinine, Urine: 55.9 mg/dL
Microalb/Creat Ratio: 14 mg/g{creat} (ref 0–29)
Microalbumin, Urine: 7.8 ug/mL

## 2024-08-14 LAB — LIPID PANEL
Chol/HDL Ratio: 3.7 ratio (ref 0.0–4.4)
Cholesterol, Total: 183 mg/dL (ref 100–199)
HDL: 50 mg/dL (ref >39)
LDL Chol Calc (NIH): 119 mg/dL — ABNORMAL HIGH (ref 0–99)
Triglycerides: 78 mg/dL (ref 0–149)
VLDL Cholesterol Cal: 14 mg/dL (ref 5–40)

## 2024-08-14 LAB — VITAMIN D 25 HYDROXY (VIT D DEFICIENCY, FRACTURES): Vit D, 25-Hydroxy: 30.5 ng/mL (ref 30.0–100.0)

## 2024-08-14 LAB — HEMOGLOBIN A1C
Est. average glucose Bld gHb Est-mCnc: 260 mg/dL
Hgb A1c MFr Bld: 10.7 % — ABNORMAL HIGH (ref 4.8–5.6)

## 2024-08-14 LAB — TSH: TSH: 4.99 u[IU]/mL — ABNORMAL HIGH (ref 0.450–4.500)

## 2024-08-14 LAB — HIV ANTIBODY (ROUTINE TESTING W REFLEX): HIV Screen 4th Generation wRfx: NONREACTIVE

## 2024-08-14 LAB — HEPATITIS C ANTIBODY: Hep C Virus Ab: NONREACTIVE

## 2024-08-15 ENCOUNTER — Telehealth: Payer: Self-pay

## 2024-08-15 ENCOUNTER — Telehealth: Payer: Self-pay | Admitting: Nurse Practitioner

## 2024-08-15 NOTE — Telephone Encounter (Signed)
 She has not been to the pharmacy yet, she did get a call from the pharmacy and is scheduled for a follow up call tomorrow at 8am.

## 2024-08-15 NOTE — Progress Notes (Signed)
 Care Guide Pharmacy Note  08/15/2024 Name: Morgan Taylor MRN: [id number] DOB: 10-21-1961  Referred By: Kennyth Domino, FNP Reason for referral: Complex Care Management (Outreach to schedule with Pharm d Wynema d requested 08/16/2024)   Morgan Taylor is a 63 y.o. year old female who is a primary care patient of Kennyth Domino, FNP.  Morgan Taylor was referred to the pharmacist for assistance related to: HTN  An unsuccessful telephone outreach was attempted today to contact the patient who was referred to the pharmacy team for assistance with medication management. Additional attempts will be made to contact the patient.  Jeoffrey Buffalo , RMA     [facility]  [facility], [facility] Guide  Direct Dial: [phone]  Website: [email or web address]

## 2024-08-15 NOTE — Progress Notes (Signed)
 Care Guide Pharmacy Note  08/15/2024 Name: Morgan Taylor MRN: [id number] DOB: [birth year]  Referred By: Kennyth Domino, FNP Reason for referral: Complex Care Management (Outreach to schedule with Pharm d Wynema d requested 08/16/2024)   Morgan Taylor is a 63 y.o. year old female who is a primary care patient of Kennyth Domino, FNP.  Morgan Taylor was referred to the pharmacist for assistance related to: HTN  Successful contact was made with the patient to discuss pharmacy services including being ready for the pharmacist to call at least 5 minutes before the scheduled appointment time and to have medication bottles and any blood pressure readings ready for review. The patient agreed to meet with the pharmacist via telephone visit on (date/time).08/16/2024  Jeoffrey Buffalo , RMA     [place]  [facility], [facility] Guide  Direct Dial: [phone]  Website: [email or web address]

## 2024-08-16 ENCOUNTER — Other Ambulatory Visit (HOSPITAL_COMMUNITY): Payer: Self-pay

## 2024-08-16 ENCOUNTER — Other Ambulatory Visit: Payer: Self-pay

## 2024-08-16 DIAGNOSIS — R7989 Other specified abnormal findings of blood chemistry: Secondary | ICD-10-CM

## 2024-08-16 NOTE — Progress Notes (Signed)
 08/16/2024 Name: Morgan Taylor MRN: [id number] DOB: [birth year]  Chief Complaint  Patient presents with   Medication Management   Diabetes    Morgan Taylor is a 63 y.o. year old female who presented for a telephone visit.   They were referred to the pharmacist by their PCP for assistance in managing complex medication management.    Subjective:  Care Team: Primary Care Provider: Kennyth Domino, FNP   Medication Access/Adherence  Current Pharmacy:  [facility] [address], [address] [place] [address] Phone: [phone] Fax: [phone]  [facility] [address] [place] [address] Phone: [phone] Fax: [phone]  [facility] [address], [address] [place] [address] Phone: [phone] Fax: [phone]   Patient reports affordability concerns with their medications: No  Patient reports access/transportation concerns to their pharmacy: No  Patient reports adherence concerns with their medications:  No     Diabetes:  Current medications: Ozempic  0.25mg  once weekly (not started yet) Medications tried in the past: Pioglitazone , Victoza , Invokana  Current glucose readings: None, no meter, unable to check  Does not have a fridge right now for medication storage but does have access to a/c to keep any meds at room temp  Hypertension:  Current medications: Losartan  100mg  daily, Amlodipine  10mg  daily Medications previously tried: Metoprolol   Patient does not have a validated, automated, upper arm home BP cuff Current blood pressure readings readings: unable to check  Hyperlipidemia/ASCVD Risk Reduction  Current lipid lowering medications: Atorvastatin  20mg  Medications tried in the past: Simvastatin   Objective:  Lab Results  Component Value Date   HGBA1C 10.7 (H) 08/12/2024    Lab Results  Component Value Date    CREATININE 0.62 08/12/2024   BUN 15 08/12/2024   NA 139 08/12/2024   K 4.1 08/12/2024   CL 99 08/12/2024   CO2 23 08/12/2024    Lab Results  Component Value Date   CHOL 183 08/12/2024   HDL 50 08/12/2024   LDLCALC 119 (H) 08/12/2024   TRIG 78 08/12/2024   CHOLHDL 3.7 08/12/2024    Medications Reviewed Today     Reviewed by Lionell Jon DEL, RPH (Pharmacist) on 08/16/24 at 1110  Med List Status: <None>   Medication Order Taking? Sig Documenting Provider Last Dose Status Informant  ALPRAZolam  (XANAX ) 1 MG tablet [id number]  Take 1 mg by mouth daily as needed.  Patient not taking: Reported on 08/16/2024   [provider]  Active   ALPRAZolam  (XANAX ) 1 MG tablet [id number]  Take 1 tablet (1 mg total) by mouth 2 (two) times daily as needed for severe anxiety/panic attacks.  Patient not taking: Reported on 08/16/2024     Active   amLODipine  (NORVASC ) 10 MG tablet [id number] Yes Take 1 tablet (10 mg total) by mouth daily. Kennyth Domino, FNP  Active   amoxicillin -clavulanate (AUGMENTIN ) 875-125 MG tablet [id number]  Take 1 tablet by mouth 2 (two) times daily. One po bid x 7 days Lorriane Holmes, MD  Active   atorvastatin  (LIPITOR) 20 MG tablet [id number] Yes Take 1 tablet (20 mg total) by mouth daily. Kennyth Domino, FNP  Active   azelastine  (ASTELIN ) 0.1 % nasal spray [id number] Yes Place 2 sprays into both nostrils 2 (two) times daily. Use in each nostril as directed Johnie Flaming A, NP  Active   benzonatate  (TESSALON ) 100 MG capsule [id number]  Take 1 capsule (100 mg total)  by mouth every 8 (eight) hours.  Patient not taking: Reported on 08/16/2024   Johnie Flaming A, NP  Active   Blood Glucose Monitoring Suppl (ACCU-CHEK GUIDE) w/Device KIT [id number]  Use as directed to check blood sugar up to 3 times per day. Kennyth Domino, FNP  Active   diclofenac  (VOLTAREN ) 75 MG EC tablet [id number] Yes Take 1 tablet (75 mg total) by mouth daily. Kennyth Domino, FNP  Active   fluconazole   (DIFLUCAN ) 150 MG tablet [id number] Yes Take one tablet today may repeat after 72 hours if needed Kennyth Domino, FNP  Active   glucose blood (ACCU-CHEK GUIDE TEST) test strip [id number]  Check blood glucose 3 times daily. Kennyth Domino, FNP  Active   glucose blood test strip [id number]  Use 1 strip daily as directed.   Active   Hearing Aid Device DEVI [id number]  2 Units by Does not apply route daily. Kennyth Domino, FNP  Active   ibuprofen  (ADVIL ) 600 MG tablet [id number] Yes Take 1 tablet (600 mg total) by mouth every 6 (six) hours as needed. Dean Clarity, MD  Active   Insulin  Pen Needle (TRUEPLUS 5-BEVEL PEN NEEDLES) 32G X 4 MM MISC [id number]  To use with Lantus  once daily Mayers, Cari S, PA-C  Active   losartan  (COZAAR ) 100 MG tablet [id number] Yes Take 1 tablet (100 mg total) by mouth daily. Kennyth Domino, FNP  Active   omeprazole  (PRILOSEC) 20 MG capsule [id number] Yes Take 1 capsule (20 mg total) by mouth daily. Kennyth Domino, FNP  Active   promethazine -dextromethorphan (PROMETHAZINE -DM) 6.25-15 MG/5ML syrup [id number]  Take 5 mLs by mouth at bedtime as needed for cough. Johnie Flaming A, NP  Active   Semaglutide ,0.25 or 0.5MG /DOS, (OZEMPIC , 0.25 OR 0.5 MG/DOSE,) 2 MG/3ML SOPN [id number]  Inject 0.25 mg into the skin once a week for 4 doses. Kennyth Domino, FNP  Active   sennosides-docusate sodium  (SENOKOT-S) 8.6-50 MG tablet [id number]  Take 2 tablets by mouth daily.  Patient not taking: Reported on 08/16/2024   Brown, Blaine K, PA-C  Active Self  sertraline  (ZOLOFT ) 100 MG tablet [id number] Yes Take 1 tablet (100 mg total) by mouth daily for anxiety/depression.   Active     Discontinued 08/16/24 0837 (Dose change)             Assessment/Plan:   Diabetes: - Currently uncontrolled - Reviewed long term cardiovascular and renal outcomes of uncontrolled blood sugar - Reviewed goal A1c, goal fasting, and goal 2 hour post prandial glucose - Reviewed dietary modifications including low carb  diet - Recommend to pick up Ozempic  and start medication TODAY. Reviewed proper use and storage.   - Patient denies personal or family history of multiple endocrine neoplasia type 2, medullary thyroid cancer; personal history of pancreatitis or gallbladder disease. - Recommend to check glucose once daily. Spoke with [facility] pharmacy and they are unable to bill her insurance to part B for testing supplies/meter. Attempted to contact patient back to ask where she would like supplies resent, had to leave a voicemail. MyChart Message Sent - If BG are elevated once checking, may need to consider addition of metformin while ozempic  is being titrated up     Hypertension: - Currently uncontrolled but patient had been without meds at time of check - Reviewed long term cardiovascular and renal outcomes of uncontrolled blood pressure - Reviewed appropriate blood pressure monitoring technique and reviewed goal blood pressure. Recommended to check home blood pressure and heart rate daily. Working to coordinate BP monitor  and supplies - Recommend to continue current medication therapy      Hyperlipidemia/ASCVD Risk Reduction: - Currently uncontrolled. But patient had been without meds at time of check - Reviewed long term complications of uncontrolled cholesterol - Recommend to continue current medication therapy. Obtain updated lipid panel in 2-3 months    Follow Up Plan: 3-4 weeks  [name], PharmD Clinical Pharmacist [phone]

## 2024-08-19 ENCOUNTER — Other Ambulatory Visit: Payer: Self-pay

## 2024-08-19 NOTE — Telephone Encounter (Signed)
 Connected with Pharmacy, plans to pick up Ozempic  and start this week

## 2024-08-21 LAB — GLUCOSE, POCT (MANUAL RESULT ENTRY): Glucose Fasting, POC: 301 mg/dL — AB (ref 70–99)

## 2024-08-21 NOTE — Progress Notes (Unsigned)
 Pt has a pcp. Pt BP was 140/75, glucose was 301mg /dl. Pt did not screen for SDOH. Pt does not smoke. Pt was recommended to contact pcp about blood glucose.

## 2024-08-22 ENCOUNTER — Other Ambulatory Visit: Payer: Self-pay | Admitting: Nurse Practitioner

## 2024-08-22 ENCOUNTER — Encounter: Payer: Self-pay | Admitting: Nurse Practitioner

## 2024-08-22 LAB — GLUCOSE, POCT (MANUAL RESULT ENTRY): POC Glucose: 293 mg/dL — AB (ref 70–99)

## 2024-08-22 NOTE — Congregational Nurse Program (Signed)
  Dept: [phone]   Congregational Nurse Program Note  Date of Encounter: 08/22/2024  Past Medical History: Past Medical History:  Diagnosis Date   Anxiety    Arthritis    Breast cancer (HCC)    Cancer (HCC) 2009   lumpectomy   Depression    takes Remeron  nightly   Diabetes mellitus    takes Lantus  and Victoza . Average fasting blood sugar runs 89   GERD (gastroesophageal reflux disease)    takes Omeprazole  daily   Hyperlipemia    takes Lipitor daily   Hypertension    takes Amlodipine  and Losartan  daily   Hypothyroidism    takes Synthroid  daily   Neck pain    Numbness    in both hands   Personal history of radiation therapy    Primary localized osteoarthrosis of right shoulder 03/21/2017    Encounter Details:  Community Questionnaire - 08/22/24 1058       Questionnaire   Ask client: Do you give verbal consent for me to treat you today? Yes    Student Assistance N/A    Location Patient Served  [facility]    Encounter Setting CN site    Population Status Unhoused    Insurance Medicaid    Insurance/Financial Assistance Referral N/A    Medication N/A    Medical Provider Yes    Screening Referrals Made N/A    Medical Referrals Made N/A    Medical Appointment Completed [facility]    CNP Interventions Advocate/Support;Navigate Healthcare System;Case Management;Counsel;Educate    Screenings CN Performed Blood Pressure;Blood Glucose;Weight    ED Visit Averted N/A    Life-Saving Intervention Made Yes         BP (!) 157/93 (BP Location: Right Arm, Patient Position: Sitting, Cuff Size: Normal)   Pulse (!) 59   Wt 154 lb (69.9 kg)   BMI 28.17 kg/m   Client to RN office. She is requesting CBG and VS check. She states she also is need of a document for hearing aid. RN able to help client get document from PCP office. NP also made aware of CBG and VS, will work on medication management. Client also states that she was concerned about labs and RN able to discuss  results and answer questions as appropriate.  Client also states she needs new glucometer kit, RN will call My pharmacy kit to attempt to get fee waived. RN will follow up with client as able.RN will also assist client with an electronic BP machine on September 4th. No further concerns at this time.

## 2024-08-26 ENCOUNTER — Encounter (HOSPITAL_COMMUNITY): Payer: Self-pay

## 2024-08-26 ENCOUNTER — Ambulatory Visit (HOSPITAL_COMMUNITY)
Admission: EM | Admit: 2024-08-26 | Discharge: 2024-08-26 | Disposition: A | Discharge status: Home / Self Care | Attending: Emergency Medicine | Admitting: Emergency Medicine

## 2024-08-26 DIAGNOSIS — J020 Streptococcal pharyngitis: Secondary | ICD-10-CM

## 2024-08-26 DIAGNOSIS — R051 Acute cough: Secondary | ICD-10-CM

## 2024-08-26 LAB — POCT RAPID STREP A (OFFICE): Rapid Strep A Screen: POSITIVE — AB

## 2024-08-26 LAB — POC SARS CORONAVIRUS 2 AG -  ED: SARS Coronavirus 2 Ag: NEGATIVE

## 2024-08-26 MED ORDER — AMOXICILLIN 500 MG PO CAPS: 500.0000 mg | ORAL_CAPSULE | Freq: Two times a day (BID) | ORAL | 0 refills | Status: AC

## 2024-08-26 MED ORDER — AMOXICILLIN 500 MG PO CAPS: 500.0000 mg | ORAL_CAPSULE | Freq: Two times a day (BID) | ORAL | 0 refills | Status: DC

## 2024-08-26 NOTE — ED Triage Notes (Signed)
 Patient reports a fever, nausea, headache, sore throat, and a non productive cough x 2 days.  Patient has  had Zyrtec and Excedrin for her symptoms.

## 2024-08-26 NOTE — Discharge Instructions (Addendum)
 Your strep test was positive. Please take the medication for the full 10 days. It's important to finish all 10 days even when you are feeling better. Otherwise the infection can come back worse. Take with food to avoid upset stomach. Make sure to change out your toothbrush! Continue other symptomatic care as needed for pain.  The amoxicillin  should be covered by your medicaid

## 2024-08-26 NOTE — ED Provider Notes (Addendum)
 [facility]    CSN: [id number] Arrival date & time: 08/26/24  0827      History   Chief Complaint Chief Complaint  Patient presents with   Cough   Sore Throat   Headache    HPI Morgan Taylor is a 63 y.o. female.  2 day history of headache, sore throat, and some dry cough Throat pain 9/10 Feeling hot and chills intermittently. No fever measured at home No known sick contacts, but reports she rides the bus No recent travel Took Excedrin migraine    She had strep positive 4 weeks ago in the ED. She did not take the antibiotics as directed.   Past Medical History:  Diagnosis Date   Anxiety    Arthritis    Breast cancer (HCC)    Cancer (HCC) 2009   lumpectomy   Depression    takes Remeron  nightly   Diabetes mellitus    takes Lantus  and Victoza . Average fasting blood sugar runs 89   GERD (gastroesophageal reflux disease)    takes Omeprazole  daily   Hyperlipemia    takes Lipitor daily   Hypertension    takes Amlodipine  and Losartan  daily   Hypothyroidism    takes Synthroid  daily   Neck pain    Numbness    in both hands   Personal history of radiation therapy    Primary localized osteoarthrosis of right shoulder 03/21/2017    Patient Active Problem List   Diagnosis Date Noted   Cervical radiculopathy 06/14/2024   Pain syndrome, chronic 06/14/2024   S/P hip replacement, left 11/10/2020   Genital herpes simplex 08/29/2018   Referred otalgia of both ears 07/19/2018   Primary localized osteoarthrosis of right shoulder 03/21/2017   S/P shoulder replacement 02/19/2016   Genetic testing 12/11/2015   primary Osteoarthritis of left knee 01/23/2015   Osteoarthritis of right knee 08/15/2014   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) 01/23/2012   History of lumpectomy of left breast 12/27/2007   HYPERGLYCEMIA 05/03/2007   HYPERPLASIA, ENDOMETRIAL NOS 02/22/2007   DYSMENORRHEA 10/18/2006   DEGENERATIVE JOINT DISEASE 10/18/2006    INSOMNIA 10/18/2006   CHEST PAIN 10/18/2006   OBESITY 10/05/2006   ANXIETY 10/05/2006   HYPERTENSION 10/05/2006   GASTROENTERITIS 10/05/2006   MENORRHAGIA, PERIMENOPAUSAL 10/05/2006    Past Surgical History:  Procedure Laterality Date   APPENDECTOMY     BREAST BIOPSY     BREAST LUMPECTOMY Left 2009   CESAREAN SECTION     COLONOSCOPY     DILATION AND CURETTAGE OF UTERUS     SHOULDER ARTHROSCOPY Left    cleaned out joint   TOTAL HIP ARTHROPLASTY Left 11/10/2020   Procedure: TOTAL HIP ARTHROPLASTY;  Surgeon: Josefina Chew, MD;  Location: [facility];  Service: Orthopedics;  Laterality: Left;   TOTAL KNEE ARTHROPLASTY Right 08/15/2014   Procedure: RIGHT TOTAL KNEE ARTHROPLASTY;  Surgeon: LELON JONETTA Shari Mickey., MD;  Location: [facility];  Service: Orthopedics;  Laterality: Right;   TOTAL KNEE ARTHROPLASTY Left 01/23/2015   Procedure: TOTAL KNEE ARTHROPLASTY;  Surgeon: LELON JONETTA Shari Mickey., MD;  Location: [facility];  Service: Orthopedics;  Laterality: Left;   TOTAL SHOULDER ARTHROPLASTY Left 02/19/2016   Procedure: LEFT TOTAL SHOULDER ARTHROPLASTY;  Surgeon: Chew Josefina, MD;  Location: [facility];  Service: Orthopedics;  Laterality: Left;   TOTAL SHOULDER ARTHROPLASTY Right 03/21/2017   Procedure: TOTAL SHOULDER ARTHROPLASTY;  Surgeon: Chew Josefina, MD;  Location: [facility];  Service: Orthopedics;  Laterality: Right;  OB History   No obstetric history on file.      Home Medications    Prior to Admission medications   Medication Sig Start Date End Date Taking? Authorizing Provider  amoxicillin  (AMOXIL ) 500 MG capsule Take 1 capsule (500 mg total) by mouth 2 (two) times daily for 10 days. 08/26/24 09/05/24 Yes Shadawn Hanaway, [place], PA-C  amLODipine  (NORVASC ) 10 MG tablet Take 1 tablet (10 mg total) by mouth daily. 08/08/24   Kennyth Domino, FNP  atorvastatin  (LIPITOR) 20 MG tablet Take 1 tablet (20 mg total) by mouth daily. 08/08/24   Kennyth Domino, FNP  azelastine  (ASTELIN ) 0.1 % nasal spray Place 2 sprays into both  nostrils 2 (two) times daily. Use in each nostril as directed 08/09/24   Johnie Flaming A, NP  Blood Glucose Monitoring Suppl (ACCU-CHEK GUIDE) w/Device KIT Use as directed to check blood sugar up to 3 times per day. 08/12/24   Kennyth Domino, FNP  diclofenac  (VOLTAREN ) 75 MG EC tablet Take 1 tablet (75 mg total) by mouth daily. 08/08/24   Kennyth Domino, FNP  fluconazole  (DIFLUCAN ) 150 MG tablet Take one tablet today may repeat after 72 hours if needed 08/12/24   Kennyth Domino, FNP  glucose blood (ACCU-CHEK GUIDE TEST) test strip Check blood glucose 3 times daily. 08/12/24   Kennyth Domino, FNP  glucose blood test strip Use 1 strip daily as directed. 07/28/23     Hearing Aid Device DEVI 2 Units by Does not apply route daily. 08/12/24   Kennyth Domino, FNP  Insulin  Pen Needle (TRUEPLUS 5-BEVEL PEN NEEDLES) 32G X 4 MM MISC To use with Lantus  once daily Patient not taking: Reported on 08/26/2024 03/18/24   Mayers, Cari S, PA-C  levothyroxine  (SYNTHROID ) 75 MCG tablet Take 75 mcg by mouth.    [provider]  losartan  (COZAAR ) 100 MG tablet Take 1 tablet (100 mg total) by mouth daily. 08/08/24   Kennyth Domino, FNP  omeprazole  (PRILOSEC) 20 MG capsule Take 1 capsule (20 mg total) by mouth daily. 08/08/24   Kennyth Domino, FNP  Semaglutide ,0.25 or 0.5MG /DOS, (OZEMPIC , 0.25 OR 0.5 MG/DOSE,) 2 MG/3ML SOPN Inject 0.25 mg into the skin once a week for 4 doses. 08/12/24 10/14/24  Kennyth Domino, FNP  sennosides-docusate sodium  (SENOKOT-S) 8.6-50 MG tablet Take 2 tablets by mouth daily. Patient not taking: Reported on 08/16/2024 11/10/20   Brown, Blaine K, PA-C  sertraline  (ZOLOFT ) 100 MG tablet Take 1 tablet (100 mg total) by mouth daily for anxiety/depression. 04/10/24       Family History Family History  Problem Relation Age of Onset   Cancer Paternal Grandmother        NOS   Pancreatic cancer Paternal Aunt    Cancer Paternal Aunt        NOS   Breast cancer Mother 23       Stage 3   Colon cancer Neg Hx     Rectal cancer Neg Hx    Stomach cancer Neg Hx     Social History Social History   Tobacco Use   Smoking status: Former   Smokeless tobacco: Never   Tobacco comments:    quit smoking 14 yrs ago  Vaping Use   Vaping status: Never Used  Substance Use Topics   Alcohol use: No   Drug use: No     Allergies   Pregabalin , Sulfonamide derivatives, and Vortioxetine   Review of Systems Review of Systems As per HPI   Physical Exam Triage Vital Signs ED Triage Vitals  Encounter Vitals Group     BP 08/26/24 0913 138/81     Girls Systolic BP Percentile --      Girls Diastolic BP Percentile --      Boys Systolic BP Percentile --      Boys Diastolic BP Percentile --      Pulse Rate 08/26/24 0913 65     Resp 08/26/24 0913 16     Temp 08/26/24 0913 98.4 F (36.9 C)     Temp Source 08/26/24 0913 Oral     SpO2 08/26/24 0913 97 %     Weight --      Height --      Head Circumference --      Peak Flow --      Pain Score 08/26/24 0912 7     Pain Loc --      Pain Education --      Exclude from Growth Chart --    No data found.  Updated Vital Signs BP 138/81 (BP Location: Right Arm)   Pulse 65   Temp 98.4 F (36.9 C) (Oral)   Resp 16   SpO2 97%    Physical Exam Vitals and nursing note reviewed.  Constitutional:      General: She is not in acute distress.    Appearance: She is not ill-appearing.  HENT:     Right Ear: Tympanic membrane and ear canal normal.     Left Ear: Tympanic membrane and ear canal normal.     Nose: No congestion or rhinorrhea.     Mouth/Throat:     Mouth: Mucous membranes are moist.     Pharynx: Oropharynx is clear. Posterior oropharyngeal erythema present.     Tonsils: No tonsillar exudate. 2+ on the right. 2+ on the left.  Eyes:     Conjunctiva/sclera: Conjunctivae normal.  Cardiovascular:     Rate and Rhythm: Normal rate and regular rhythm.     Pulses: Normal pulses.     Heart sounds: Normal heart sounds.  Pulmonary:     Effort:  Pulmonary effort is normal.     Breath sounds: Normal breath sounds.  Musculoskeletal:     Cervical back: Normal range of motion.  Lymphadenopathy:     Cervical: No cervical adenopathy.  Skin:    General: Skin is warm and dry.  Neurological:     Mental Status: She is alert and oriented to person, place, and time.      UC Treatments / Results  Labs (all labs ordered are listed, but only abnormal results are displayed) Labs Reviewed  POCT RAPID STREP A (OFFICE) - Abnormal; Notable for the following components:      Result Value   Rapid Strep A Screen Positive (*)    All other components within normal limits  POC SARS CORONAVIRUS 2 AG -  ED    EKG   Radiology No results found.  Procedures Procedures (including critical care time)  Medications Ordered in UC Medications - No data to display  Initial Impression / Assessment and Plan / UC Course  I have reviewed the triage vital signs and the nursing notes.  Pertinent labs & imaging results that were available during my care of the patient were reviewed by me and considered in my medical decision making (see chart for details).  Afebrile in clinic. Normal phonation, tolerating secretions  Rapid strep positive Amoxicillin  BID x 10 days. Discussed importance of taking every dose as directed, finishing all 10 days. Discussed dangers of untreated  strep infections.  Other supportive care, and importance of changing out toothbrush  Patient requesting note for her caseworker stating what she has and that it's contagious. This is provided  Final Clinical Impressions(s) / UC Diagnoses   Final diagnoses:  Acute cough  Strep pharyngitis     Discharge Instructions      Your strep test was positive. Please take the medication for the full 10 days. It's important to finish all 10 days even when you are feeling better. Otherwise the infection can come back worse. Take with food to avoid upset stomach. Make sure to change out  your toothbrush! Continue other symptomatic care as needed for pain.  The amoxicillin  should be covered by your medicaid     ED Prescriptions     Medication Sig Dispense Auth. Provider   amoxicillin  (AMOXIL ) 500 MG capsule Take 1 capsule (500 mg total) by mouth 2 (two) times daily for 10 days. 20 capsule Kanchan Gal, [place], PA-C      PDMP not reviewed this encounter.   Shaniah Baltes, [place] 08/26/24 1017    Mance Vallejo, [place], [place] 08/26/24 1020

## 2024-08-29 LAB — GLUCOSE, POCT (MANUAL RESULT ENTRY): POC Glucose: 228 mg/dL — AB (ref 70–99)

## 2024-08-29 NOTE — Congregational Nurse Program (Signed)
  Dept: [phone]   Congregational Nurse Program Note  Date of Encounter: 08/29/2024  Past Medical History: Past Medical History:  Diagnosis Date   Anxiety    Arthritis    Breast cancer (HCC)    Cancer (HCC) 2009   lumpectomy   Depression    takes Remeron  nightly   Diabetes mellitus    takes Lantus  and Victoza . Average fasting blood sugar runs 89   GERD (gastroesophageal reflux disease)    takes Omeprazole  daily   Hyperlipemia    takes Lipitor daily   Hypertension    takes Amlodipine  and Losartan  daily   Hypothyroidism    takes Synthroid  daily   Neck pain    Numbness    in both hands   Personal history of radiation therapy    Primary localized osteoarthrosis of right shoulder 03/21/2017    Encounter Details:  Community Questionnaire - 08/29/24 1003       Questionnaire   Ask client: Do you give verbal consent for me to treat you today? Yes    Student Assistance N/A    Location Patient Served  [facility]    Encounter Setting CN site    Population Status Unhoused    Insurance Medicaid    Insurance/Financial Assistance Referral N/A    Medication Patient Medications Reviewed    Medical Provider Yes    Screening Referrals Made N/A    Medical Referrals Made N/A    Medical Appointment Completed [facility]    CNP Interventions Advocate/Support;Navigate Healthcare System;Case Management;Counsel;Educate    Screenings CN Performed Blood Glucose    ED Visit Averted N/A    Life-Saving Intervention Made N/A         Client to RN office for blood sugar check. She states she just ate a big meal and has been taking her weekly injections. CBG is 228. She is working on stable housing and is very close to having permanent housing. RN also able to provide spiritual care for client as she navigates personal issues. RN able to counsel, listen and therapeutic communication. RN will continue to support client as needed.

## 2024-09-05 ENCOUNTER — Other Ambulatory Visit: Payer: Self-pay

## 2024-09-05 ENCOUNTER — Telehealth: Admitting: Nurse Practitioner

## 2024-09-05 DIAGNOSIS — G8929 Other chronic pain: Secondary | ICD-10-CM | POA: Diagnosis not present

## 2024-09-05 DIAGNOSIS — E1142 Type 2 diabetes mellitus with diabetic polyneuropathy: Secondary | ICD-10-CM | POA: Insufficient documentation

## 2024-09-05 DIAGNOSIS — M545 Low back pain, unspecified: Secondary | ICD-10-CM | POA: Diagnosis not present

## 2024-09-05 DIAGNOSIS — M6289 Other specified disorders of muscle: Secondary | ICD-10-CM | POA: Insufficient documentation

## 2024-09-05 DIAGNOSIS — M2012 Hallux valgus (acquired), left foot: Secondary | ICD-10-CM | POA: Insufficient documentation

## 2024-09-05 DIAGNOSIS — E1165 Type 2 diabetes mellitus with hyperglycemia: Secondary | ICD-10-CM

## 2024-09-05 MED ORDER — DICLOFENAC SODIUM 75 MG PO TBEC
75.0000 mg | DELAYED_RELEASE_TABLET | Freq: Every day | ORAL | 0 refills | Status: AC
  Filled 2024-09-05: qty 30, 30d supply, fill #0

## 2024-09-05 NOTE — Progress Notes (Signed)
 Acute Video Visit    Virtual Visit Consent:   Morgan Taylor, you are scheduled for a virtual visit with a [facility] provider today.     Just as with appointments in the office, your consent must be obtained to participate.  Your consent will be active for this visit and any virtual visit you may have with one of our providers in the next 365 days.     If you have a MyChart account, a copy of this consent can be sent to you electronically.  All virtual visits are billed to your insurance company just like a traditional visit in the office.    If the connection with a video visit is poor, the visit may have to be switched to a telephone visit.  With either a video or telephone visit, we are not always able to ensure that we have a secure connection.     I need to obtain your verbal consent now.   Are you willing to proceed with your visit today?    Morgan Taylor has provided verbal consent on 09/05/2024 for a virtual visit (video or telephone).   [name], FNP  Date: 09/05/2024 9:57 AM  Subjective:     Patient ID: Morgan Morgan Taylor, female    DOB: Nov 26, 1961, 63 y.o.   MRN: [id number]  LILLETTE [name], connected with  KATELAN HIRT  ([id number], February 22, 1961) on 09/05/24 at 10:00 AM EDT by a video-enabled telemedicine application and verified that I am speaking with the correct person using two identifiers.   Location: Patient: [facility]  Provider: Virtual Visit Location Provider: Home Office   I discussed the limitations of evaluation and management by telemedicine and the availability of in person appointments. The patient expressed understanding and agreed to proceed.     HPI Morgan Taylor is a 63 y.o. who identifies as a female who was assigned female at birth, and is being seen today for follow up after starting Ozempic . She has taken the shot twice so far. Used in once on week one and skipped last week and then she did receive her dose this week.   She did stop into [facility] office on 08/29/24  for glucose check and was 228 non fasting.  This was after one week on Ozempic   She will return to [facility] office today at the [facility] for glucose check and will plan to continue Ozempic  for the next two weeks.   Denies SE at this time to new medication   She is also requesting a refill on her Diclofenac  that she uses for chronic pain in her lower back. She is scheduled to follow up at [facility] for an injection.    No other issues to discuss today       Objective:    There were no vitals taken for this visit. BP Readings from Last 3 Encounters:  08/26/24 138/81  08/22/24 (!) 157/93  08/21/24 (!) 140/75      Physical Exam Constitutional:      General: She is not in acute distress.    Appearance: Normal appearance.  HENT:     Head: Normocephalic.     Nose: Nose normal.     Mouth/Throat:     Mouth: Mucous membranes are moist.  Pulmonary:     Effort: Pulmonary effort is normal.  Neurological:     Mental Status: She is alert and oriented to person, place, and time.  Psychiatric:        Mood  and Affect: Mood normal.    Recent Results (from the past 2160 hours)  Resp panel by RT-PCR (RSV, Flu A&B, Covid) Anterior Nasal Swab     Status: None   Collection Time: 07/30/24  1:54 PM   Specimen: Anterior Nasal Swab  Result Value Ref Range   SARS Coronavirus 2 by RT PCR NEGATIVE NEGATIVE   Influenza A by PCR NEGATIVE NEGATIVE   Influenza B by PCR NEGATIVE NEGATIVE    Comment: (NOTE) The Xpert Xpress SARS-CoV-2/FLU/RSV plus assay is intended as an aid in the diagnosis of influenza from Nasopharyngeal swab specimens and should not be used as a sole basis for treatment. Nasal washings and aspirates are unacceptable for Xpert Xpress SARS-CoV-2/FLU/RSV testing.  Fact Sheet for Patients: BloggerCourse.com  Fact Sheet for Healthcare Providers: SeriousBroker.it  This test is not yet approved or cleared by the [place]  FDA and has been  authorized for detection and/or diagnosis of SARS-CoV-2 by FDA under an Emergency Use Authorization (EUA). This EUA will remain in effect (meaning this test can be used) for the duration of the COVID-19 declaration under Section 564(b)(1) of the Act, 21 U.S.C. section 360bbb-3(b)(1), unless the authorization is terminated or revoked.     Resp Syncytial Virus by PCR NEGATIVE NEGATIVE    Comment: (NOTE) Fact Sheet for Patients: BloggerCourse.com  Fact Sheet for Healthcare Providers: SeriousBroker.it  This test is not yet approved or cleared by the [place]  FDA and has been authorized for detection and/or diagnosis of SARS-CoV-2 by FDA under an Emergency Use Authorization (EUA). This EUA will remain in effect (meaning this test can be used) for the duration of the COVID-19 declaration under Section 564(b)(1) of the Act, 21 U.S.C. section 360bbb-3(b)(1), unless the authorization is terminated or revoked.  Performed at [facility], [address], [place], [place] [address]   Group A Strep by PCR if patient complains of sore throat.     Status: Abnormal   Collection Time: 07/30/24  1:54 PM   Specimen: Anterior Nasal Swab; Sterile Swab  Result Value Ref Range   Group A Strep by PCR DETECTED (A) NOT DETECTED    Comment: Performed at [facility], [address], [place], [place] [address]  Urinalysis, Routine w reflex microscopic -Urine, Clean Catch     Status: Abnormal   Collection Time: 07/30/24  1:54 PM  Result Value Ref Range   Color, Urine YELLOW YELLOW   APPearance CLOUDY (A) CLEAR   Specific Gravity, Urine 1.026 1.005 - 1.030   pH 6.0 5.0 - 8.0   Glucose, UA >=500 (A) NEGATIVE mg/dL   Hgb urine dipstick SMALL (A) NEGATIVE   Bilirubin Urine NEGATIVE NEGATIVE   Ketones, ur NEGATIVE NEGATIVE mg/dL   Protein, ur 30 (A) NEGATIVE mg/dL   Nitrite NEGATIVE NEGATIVE   Leukocytes,Ua MODERATE (A) NEGATIVE   RBC /  HPF 0-5 0 - 5 RBC/hpf   WBC, UA >50 0 - 5 WBC/hpf   Bacteria, UA RARE (A) NONE SEEN   Squamous Epithelial / HPF 21-50 0 - 5 /HPF    Comment: Performed at [facility], [address], [place], [place] [address]  POCT glucose (manual entry)     Status: Abnormal   Collection Time: 08/08/24  1:33 PM  Result Value Ref Range   POC Glucose 345 (A) 70 - 99 mg/dl  POC SARS Coronavirus 2 Ag-ED - Nasal Swab     Status: Normal   Collection Time: 08/09/24 12:13 PM  Result Value Ref Range   SARS Coronavirus 2 Ag Negative Negative  CBC with Differential/Platelet     Status: Abnormal   Collection Time: 08/12/24  9:02 AM  Result Value Ref Range   WBC 7.5 3.4 - 10.8 x10E3/uL   RBC 4.58 3.77 - 5.28 x10E6/uL   Hemoglobin 13.4 11.1 - 15.9 g/dL   Hematocrit 56.4 65.9 - 46.6 %   MCV 95 79 - 97 fL   MCH 29.3 26.6 - 33.0 pg   MCHC 30.8 (L) 31.5 - 35.7 g/dL   RDW 87.3 88.2 - 84.5 %   Platelets 263 150 - 450 x10E3/uL   Neutrophils 64 Not Estab. %   Lymphs 27 Not Estab. %   Monocytes 6 Not Estab. %   Eos 2 Not Estab. %   Basos 1 Not Estab. %   Neutrophils Absolute 4.8 1.4 - 7.0 x10E3/uL   Lymphocytes Absolute 2.1 0.7 - 3.1 x10E3/uL   Monocytes Absolute 0.5 0.1 - 0.9 x10E3/uL   EOS (ABSOLUTE) 0.1 0.0 - 0.4 x10E3/uL   Basophils Absolute 0.0 0.0 - 0.2 x10E3/uL   Immature Granulocytes 0 Not Estab. %   Immature Grans (Abs) 0.0 0.0 - 0.1 x10E3/uL  Comprehensive metabolic panel with GFR     Status: Abnormal   Collection Time: 08/12/24  9:02 AM  Result Value Ref Range   Glucose 222 (H) 70 - 99 mg/dL   BUN 15 8 - 27 mg/dL   Creatinine, Ser 9.37 0.57 - 1.00 mg/dL   eGFR 898 >40 fO/fpw/8.26   BUN/Creatinine Ratio 24 12 - 28   Sodium 139 134 - 144 mmol/L   Potassium 4.1 3.5 - 5.2 mmol/L   Chloride 99 96 - 106 mmol/L   CO2 23 20 - 29 mmol/L   Calcium  9.4 8.7 - 10.3 mg/dL   Total Protein 6.8 6.0 - 8.5 g/dL   Albumin  4.0 3.9 - 4.9 g/dL   Globulin, Total 2.8 1.5 - 4.5 g/dL   Bilirubin Total 0.5 0.0 -  1.2 mg/dL   Alkaline Phosphatase 90 44 - 121 IU/L   AST 10 0 - 40 IU/L   ALT 12 0 - 32 IU/L  Hemoglobin A1c     Status: Abnormal   Collection Time: 08/12/24  9:02 AM  Result Value Ref Range   Hgb A1c MFr Bld 10.7 (H) 4.8 - 5.6 %    Comment:          Prediabetes: 5.7 - 6.4          Diabetes: >6.4          Glycemic control for adults with diabetes: <7.0    Est. average glucose Bld gHb Est-mCnc 260 mg/dL  Lipid panel     Status: Abnormal   Collection Time: 08/12/24  9:02 AM  Result Value Ref Range   Cholesterol, Total 183 100 - 199 mg/dL   Triglycerides 78 0 - 149 mg/dL   HDL 50 >60 mg/dL   VLDL Cholesterol Cal 14 5 - 40 mg/dL   LDL Chol Calc (NIH) 880 (H) 0 - 99 mg/dL   Chol/HDL Ratio 3.7 0.0 - 4.4 ratio    Comment:                                   T. Chol/HDL Ratio  Men  Women                               1/2 Avg.Risk  3.4    3.3                                   Avg.Risk  5.0    4.4                                2X Avg.Risk  9.6    7.1                                3X Avg.Risk 23.4   11.0   TSH     Status: Abnormal   Collection Time: 08/12/24  9:02 AM  Result Value Ref Range   TSH 4.990 (H) 0.450 - 4.500 uIU/mL  VITAMIN D  25 Hydroxy (Vit-D Deficiency, Fractures)     Status: None   Collection Time: 08/12/24  9:02 AM  Result Value Ref Range   Vit D, 25-Hydroxy 30.5 30.0 - 100.0 ng/mL    Comment: Vitamin D  deficiency has been defined by the Institute of Medicine and an Endocrine Society practice guideline as a level of serum 25-OH vitamin D  less than 20 ng/mL (1,2). The Endocrine Society went on to further define vitamin D  insufficiency as a level between 21 and 29 ng/mL (2). 1. IOM (Institute of Medicine). 2010. Dietary reference    intakes for calcium  and D. [place]: The    [employer]. 2. Holick MF, [place], Bischoff-Ferrari HA, et al.    Evaluation, treatment, and prevention of vitamin D      deficiency: an Endocrine Society clinical practice    guideline. JCEM. 2011 Jul; 96(7):1911-30.   HIV Antibody (routine testing w rflx)     Status: None   Collection Time: 08/12/24  9:02 AM  Result Value Ref Range   HIV Screen 4th Generation wRfx Non Reactive Non Reactive    Comment: HIV-1/HIV-2 antibodies and HIV-1 p24 antigen were NOT detected. There is no laboratory evidence of HIV infection. HIV Negative   Microalbumin / creatinine urine ratio     Status: None   Collection Time: 08/12/24  9:02 AM  Result Value Ref Range   Creatinine, Urine 55.9 Not Estab. mg/dL   Microalbumin, Urine 7.8 Not Estab. ug/mL   Microalb/Creat Ratio 14 0 - 29 mg/g creat    Comment:                        Normal:                0 -  29                        Moderately increased: 30 - 300                        Severely increased:       >300   Hepatitis C antibody     Status: None   Collection Time: 08/12/24  9:02 AM  Result Value Ref Range   Hep C Virus Ab Non Reactive Non Reactive    Comment:  HCV antibody alone does not differentiate between previously resolved infection and active infection. Equivocal and Reactive HCV antibody results should be followed up with an HCV RNA test to support the diagnosis of active HCV infection.   POCT glucose (manual entry)     Status: Abnormal   Collection Time: 08/21/24  5:11 PM  Result Value Ref Range   Glucose Fasting, POC 301 (A) 70 - 99 mg/dL  POCT glucose (manual entry)     Status: Abnormal   Collection Time: 08/22/24 10:49 AM  Result Value Ref Range   POC Glucose 293 (A) 70 - 99 mg/dl  POCT rapid strep A     Status: Abnormal   Collection Time: 08/26/24  9:33 AM  Result Value Ref Range   Rapid Strep A Screen Positive (A) Negative  POC SARS Coronavirus 2 Ag-ED - Nasal Swab     Status: None   Collection Time: 08/26/24  9:44 AM  Result Value Ref Range   SARS Coronavirus 2 Ag Negative Negative  POCT glucose (manual entry)     Status: Abnormal    Collection Time: 08/29/24 10:03 AM  Result Value Ref Range   POC Glucose 228 (A) 70 - 99 mg/dl     Assessment & Plan:   1. Chronic right-sided low back pain, unspecified whether sciatica present - diclofenac  (VOLTAREN ) 75 MG EC tablet; Take 1 tablet (75 mg total) by mouth daily.  Dispense: 30 tablet; Refill: 0  2. Type 2 diabetes mellitus with hyperglycemia, without long-term current use of insulin  (HCC) (Primary)   Continue Ozempic  injections once a week [facility] to [facility] for glucose checks as often as possible  [facility] may also continue to monitor BP and weight   Follow up earlier with any SE Will have follow up appointment in two weeks to discuss titration of Ozempic    Continue to work with [facility] and [facility] for resources on housing and SDOH needs   Follow Up Instructions: I discussed the assessment and treatment plan with the patient. The patient was provided an opportunity to ask questions and all were answered. The patient agreed with the plan and demonstrated an understanding of the instructions.  A copy of instructions were sent to the patient via MyChart unless otherwise noted below.    The patient was advised to call back or seek an in-person evaluation if the symptoms worsen or if the condition fails to improve as anticipated.    [name], FNP  **Disclaimer: This note may have been dictated with voice recognition software. Similar sounding words can inadvertently be transcribed and this note may contain transcription errors which may not have been corrected upon publication of note.**

## 2024-09-06 ENCOUNTER — Other Ambulatory Visit: Payer: Self-pay

## 2024-09-06 DIAGNOSIS — E119 Type 2 diabetes mellitus without complications: Secondary | ICD-10-CM

## 2024-09-06 DIAGNOSIS — Z794 Long term (current) use of insulin: Secondary | ICD-10-CM

## 2024-09-06 DIAGNOSIS — E1165 Type 2 diabetes mellitus with hyperglycemia: Secondary | ICD-10-CM

## 2024-09-06 DIAGNOSIS — R7989 Other specified abnormal findings of blood chemistry: Secondary | ICD-10-CM

## 2024-09-06 MED ORDER — ACCU-CHEK GUIDE ME W/DEVICE KIT: PACK | 0 refills | Status: AC

## 2024-09-06 MED ORDER — ACCU-CHEK GUIDE TEST VI STRP: ORAL_STRIP | 3 refills | Status: AC

## 2024-09-06 MED ORDER — ACCU-CHEK SOFTCLIX LANCETS MISC: 12 refills | Status: AC

## 2024-09-06 NOTE — Progress Notes (Signed)
 09/06/2024 Name: Morgan Taylor MRN: [id number] DOB: 1961/10/09  Chief Complaint  Patient presents with   Medication Management    Morgan Taylor is a 63 y.o. year old female who presented for a telephone visit.   They were referred to the pharmacist by their PCP for assistance in managing complex medication management.    Subjective:  Care Team: Primary Care Provider: Kennyth Domino, FNP   Medication Access/Adherence  Current Pharmacy:  [id number] [address] [place], [place] - [address] [place] [address] Phone: [phone] Fax: [phone]   Patient reports affordability concerns with their medications: No  Patient reports access/transportation concerns to their pharmacy: No  Patient reports adherence concerns with their medications:  No     Diabetes:  Current medications: Ozempic  0.25mg  once weekly  Medications tried in the past: Pioglitazone , Victoza , Invokana  Current glucose readings: None, no meter, unable to check  Does not have a fridge right now for medication storage but does have access to a/c to keep any meds at room temp  Hypertension:  Current medications: Losartan  100mg  daily, Amlodipine  10mg  daily Medications previously tried: Metoprolol   Patient does not have a validated, automated, upper arm home BP cuff Current blood pressure readings readings: unable to check  Hyperlipidemia/ASCVD Risk Reduction  Current lipid lowering medications: Atorvastatin  20mg  Medications tried in the past: Simvastatin   Objective:  Lab Results  Component Value Date   HGBA1C 10.7 (H) 08/12/2024    Lab Results  Component Value Date   CREATININE 0.62 08/12/2024   BUN 15 08/12/2024   NA 139 08/12/2024   K 4.1 08/12/2024   CL 99 08/12/2024   CO2 23 08/12/2024    Lab Results  Component Value Date   CHOL 183 08/12/2024   HDL 50 08/12/2024   LDLCALC 119 (H) 08/12/2024   TRIG 78 08/12/2024   CHOLHDL 3.7 08/12/2024    Medications  Reviewed Today     Reviewed by Lionell Jon DEL, RPH (Pharmacist) on 09/06/24 at [phone]  Med List Status: <None>   Medication Order Taking? Sig Documenting Provider Last Dose Status Informant  amLODipine  (NORVASC ) 10 MG tablet [id number]  Take 1 tablet (10 mg total) by mouth daily. Kennyth Domino, FNP  Active   atorvastatin  (LIPITOR) 20 MG tablet [id number]  Take 1 tablet (20 mg total) by mouth daily. Kennyth Domino, FNP  Active   azelastine  (ASTELIN ) 0.1 % nasal spray [id number]  Place 2 sprays into both nostrils 2 (two) times daily. Use in each nostril as directed Johnie Flaming A, NP  Active   Blood Glucose Monitoring Suppl (ACCU-CHEK GUIDE) w/Device KIT [id number]  Use as directed to check blood sugar up to 3 times per day. Kennyth Domino, FNP  Active   diclofenac  (VOLTAREN ) 75 MG EC tablet [id number]  Take 1 tablet (75 mg total) by mouth daily. Kennyth Domino, FNP  Active   fluconazole  (DIFLUCAN ) 150 MG tablet [id number]  Take one tablet today may repeat after 72 hours if needed Kennyth Domino, FNP  Active   glucose blood (ACCU-CHEK GUIDE TEST) test strip [id number]  Check blood glucose 3 times daily. Kennyth Domino, FNP  Active   glucose blood test strip [id number] No Use 1 strip daily as directed.  08/09/2024 Active   Hearing Aid Device DEVI [id number]  2 Units by Does not apply route daily. Kennyth Domino, FNP  Active   Insulin  Pen Needle (TRUEPLUS 5-BEVEL PEN NEEDLES) 32G X 4 MM MISC [id number] No To use with Lantus   once daily  Patient not taking: Reported on 08/26/2024   Mayers, Cari S, PA-C Not Taking Active   levothyroxine  (SYNTHROID ) 75 MCG tablet [id number]  Take 75 mcg by mouth. [provider]  Active   losartan  (COZAAR ) 100 MG tablet [id number]  Take 1 tablet (100 mg total) by mouth daily. Kennyth Domino, FNP  Active   omeprazole  (PRILOSEC) 20 MG capsule [id number]  Take 1 capsule (20 mg total) by mouth daily. Kennyth Domino, FNP  Active   Semaglutide ,0.25 or 0.5MG /DOS, (OZEMPIC , 0.25 OR 0.5  MG/DOSE,) 2 MG/3ML SOPN [id number]  Inject 0.25 mg into the skin once a week for 4 doses. Kennyth Domino, FNP  Active   sennosides-docusate sodium  (SENOKOT-S) 8.6-50 MG tablet [id number]  Take 2 tablets by mouth daily.  Patient not taking: Reported on 08/16/2024   Brown, Blaine K, PA-C  Active Self  sertraline  (ZOLOFT ) 100 MG tablet [id number]  Take 1 tablet (100 mg total) by mouth daily for anxiety/depression.   Active               Assessment/Plan:   Diabetes: - Currently uncontrolled - Reviewed long term cardiovascular and renal outcomes of uncontrolled blood sugar - Reviewed goal A1c, goal fasting, and goal 2 hour post prandial glucose - Reviewed dietary modifications including low carb diet - Recommend to continue Ozempic . Consider dose increase at PCP follow up. Reviewed proper use and storage.   - Patient denies personal or family history of multiple endocrine neoplasia type 2, medullary thyroid cancer; personal history of pancreatitis or gallbladder disease. - Recommend to check glucose once daily. Sending supplies to [facility] at patient request - If BG are elevated once checking, may need to consider addition of metformin while ozempic  is being titrated up     Hypertension: - Currently uncontrolled but patient had been without meds at time of check - Reviewed long term cardiovascular and renal outcomes of uncontrolled blood pressure - Reviewed appropriate blood pressure monitoring technique and reviewed goal blood pressure. Recommended to check home blood pressure and heart rate daily. Working to coordinate BP monitor and supplies - Recommend to continue current medication therapy      Hyperlipidemia/ASCVD Risk Reduction: - Currently uncontrolled. But patient had been without meds at time of check - Reviewed long term complications of uncontrolled cholesterol - Recommend to continue current medication therapy. Obtain updated lipid panel in 2-3 months    Follow Up  Plan: 3-4 weeks  [name], PharmD Clinical Pharmacist [phone]

## 2024-09-10 ENCOUNTER — Telehealth: Payer: Self-pay

## 2024-09-10 NOTE — Progress Notes (Signed)
   09/10/2024  Patient ID: Morgan Taylor, female   DOB: [birth year], 63 y.o.   MRN: [id number]  The patients has a diagnosis of hypertension and it is medically necessary for them to have access to a home device to monitor blood pressure.  The patient does not have readily available insurance access to a device and cannot afford to purchase a device at this time.  The patient has been counseled that they do not need to continue to receive services from [facility] to receive a device.  The patient will be given a device free of charge.  Follow Up: 09/27/24  [name], PharmD Clinical Pharmacist [phone]

## 2024-09-13 ENCOUNTER — Encounter (HOSPITAL_COMMUNITY): Payer: Self-pay

## 2024-09-13 ENCOUNTER — Ambulatory Visit (HOSPITAL_COMMUNITY)
Admission: EM | Admit: 2024-09-13 | Discharge: 2024-09-13 | Disposition: A | Discharge status: Home / Self Care | Attending: Family Medicine | Admitting: Family Medicine

## 2024-09-13 DIAGNOSIS — M79601 Pain in right arm: Secondary | ICD-10-CM

## 2024-09-13 DIAGNOSIS — M25511 Pain in right shoulder: Secondary | ICD-10-CM | POA: Diagnosis not present

## 2024-09-13 MED ORDER — KETOROLAC TROMETHAMINE 30 MG/ML IJ SOLN
INTRAMUSCULAR | Status: AC
  Filled 2024-09-13: qty 1

## 2024-09-13 MED ORDER — METHYLPREDNISOLONE 4 MG PO TBPK: ORAL_TABLET | ORAL | 0 refills | Status: AC

## 2024-09-13 MED ORDER — KETOROLAC TROMETHAMINE 30 MG/ML IJ SOLN
30.0000 mg | Freq: Once | INTRAMUSCULAR | Status: AC
  Administered 2024-09-13: 30 mg via INTRAMUSCULAR

## 2024-09-13 NOTE — ED Provider Notes (Signed)
 [facility]    CSN: [id number] Arrival date & time: 09/13/24  1316      History   Chief Complaint Chief Complaint  Patient presents with   Shoulder Pain   Elbow Pain   Back Pain    HPI Morgan Taylor is a 63 y.o. female.    Shoulder Pain Associated symptoms: back pain   Back Pain  Patient is here for right shoulder pain x 2 weeks, radiating down the arm and into the hands/fingers.  She did see [facility], but having worsening pain.  Has another apt on the 30th.  She was told this is likely coming from the spine, has not had any imaging yet.   Can't lay on the arm/shoulder.  She can move it, and if she moves it a certain way will get a sharp pain.  She has pain at the upper back, and behind the upper arm.  The pain is is making her nauseated.  She is taking tylenol , motrin  with little help.        Past Medical History:  Diagnosis Date   Anxiety    Arthritis    Breast cancer (HCC)    Cancer (HCC) 2009   lumpectomy   Depression    takes Remeron  nightly   Diabetes mellitus    takes Lantus  and Victoza . Average fasting blood sugar runs 89   GERD (gastroesophageal reflux disease)    takes Omeprazole  daily   Hyperlipemia    takes Lipitor daily   Hypertension    takes Amlodipine  and Losartan  daily   Hypothyroidism    takes Synthroid  daily   Neck pain    Numbness    in both hands   Personal history of radiation therapy    Primary localized osteoarthrosis of right shoulder 03/21/2017    Patient Active Problem List   Diagnosis Date Noted   Hallux valgus of left foot 09/05/2024   Polyneuropathy due to type 2 diabetes mellitus (HCC) 09/05/2024   Tightness of right gastrocnemius muscle 09/05/2024   Cervical radiculopathy 06/14/2024   Pain syndrome, chronic 06/14/2024   Radial styloid tenosynovitis (de quervain) 05/02/2024   S/P hip replacement, left 11/10/2020   Genital herpes simplex 08/29/2018   Malignant tumor of breast (HCC) 08/29/2018    Type 2 diabetes mellitus (HCC) 08/29/2018   Referred otalgia of both ears 07/19/2018   Primary localized osteoarthrosis of right shoulder 03/21/2017   S/P shoulder replacement 02/19/2016   Genetic testing 12/11/2015   primary Osteoarthritis of left knee 01/23/2015   Osteoarthritis of right knee 08/15/2014   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) 01/23/2012   Invasive carcinoma of breast (HCC) 04/25/2008   History of lumpectomy of left breast 12/27/2007   HYPERGLYCEMIA 05/03/2007   HYPERPLASIA, ENDOMETRIAL NOS 02/22/2007   DYSMENORRHEA 10/18/2006   DEGENERATIVE JOINT DISEASE 10/18/2006   INSOMNIA 10/18/2006   CHEST PAIN 10/18/2006   OBESITY 10/05/2006   ANXIETY 10/05/2006   HYPERTENSION 10/05/2006   GASTROENTERITIS 10/05/2006   MENORRHAGIA, PERIMENOPAUSAL 10/05/2006    Past Surgical History:  Procedure Laterality Date   APPENDECTOMY     BREAST BIOPSY     BREAST LUMPECTOMY Left 2009   CESAREAN SECTION     COLONOSCOPY     DILATION AND CURETTAGE OF UTERUS     SHOULDER ARTHROSCOPY Left    cleaned out joint   TOTAL HIP ARTHROPLASTY Left 11/10/2020   Procedure: TOTAL HIP ARTHROPLASTY;  Surgeon: Josefina Chew, MD;  Location:  [facility];  Service: Orthopedics;  Laterality: Left;   TOTAL KNEE ARTHROPLASTY Right 08/15/2014   Procedure: RIGHT TOTAL KNEE ARTHROPLASTY;  Surgeon: LELON JONETTA Shari Mickey., MD;  Location: [facility];  Service: Orthopedics;  Laterality: Right;   TOTAL KNEE ARTHROPLASTY Left 01/23/2015   Procedure: TOTAL KNEE ARTHROPLASTY;  Surgeon: LELON JONETTA Shari Mickey., MD;  Location: [facility];  Service: Orthopedics;  Laterality: Left;   TOTAL SHOULDER ARTHROPLASTY Left 02/19/2016   Procedure: LEFT TOTAL SHOULDER ARTHROPLASTY;  Surgeon: Fonda Olmsted, MD;  Location: [facility];  Service: Orthopedics;  Laterality: Left;   TOTAL SHOULDER ARTHROPLASTY Right 03/21/2017   Procedure: TOTAL SHOULDER ARTHROPLASTY;  Surgeon: Fonda Olmsted, MD;  Location: [facility];  Service: Orthopedics;   Laterality: Right;    OB History   No obstetric history on file.      Home Medications    Prior to Admission medications   Medication Sig Start Date End Date Taking? Authorizing Provider  Accu-Chek Softclix Lancets lancets Use to check blood sugar once daily Dx E11.9 09/06/24  Yes Kennyth Domino, FNP  amLODipine  (NORVASC ) 10 MG tablet Take 1 tablet (10 mg total) by mouth daily. 08/08/24  Yes Kennyth Domino, FNP  atorvastatin  (LIPITOR) 20 MG tablet Take 1 tablet (20 mg total) by mouth daily. 08/08/24  Yes Kennyth Domino, FNP  diclofenac  (VOLTAREN ) 75 MG EC tablet Take 1 tablet (75 mg total) by mouth daily. 09/05/24  Yes Kennyth Domino, FNP  glucose blood (ACCU-CHEK GUIDE TEST) test strip Check blood glucose 3 times daily. 08/12/24  Yes Kennyth Domino, FNP  glucose blood test strip Use 1 strip daily as directed. 07/28/23  Yes   losartan  (COZAAR ) 100 MG tablet Take 1 tablet (100 mg total) by mouth daily. 08/08/24  Yes Kennyth Domino, FNP  Semaglutide ,0.25 or 0.5MG /DOS, (OZEMPIC , 0.25 OR 0.5 MG/DOSE,) 2 MG/3ML SOPN Inject 0.25 mg into the skin once a week for 4 doses. 08/12/24 10/14/24 Yes Kennyth Domino, FNP  sertraline  (ZOLOFT ) 100 MG tablet Take 1 tablet (100 mg total) by mouth daily for anxiety/depression. 04/10/24  Yes   azelastine  (ASTELIN ) 0.1 % nasal spray Place 2 sprays into both nostrils 2 (two) times daily. Use in each nostril as directed 08/09/24   Johnie Flaming A, NP  Blood Glucose Monitoring Suppl (ACCU-CHEK GUIDE ME) w/Device KIT Use to check blood sugar once daily Dx E11.9 09/06/24   Kennyth Domino, FNP  Blood Glucose Monitoring Suppl (ACCU-CHEK GUIDE) w/Device KIT Use as directed to check blood sugar up to 3 times per day. 08/12/24   Kennyth Domino, FNP  fluconazole  (DIFLUCAN ) 150 MG tablet Take one tablet today may repeat after 72 hours if needed 08/12/24   Kennyth Domino, FNP  glucose blood (ACCU-CHEK GUIDE TEST) test strip Use to check blood sugar once daily Dx E11.9 09/06/24   Kennyth Domino, FNP   Hearing Aid Device DEVI 2 Units by Does not apply route daily. 08/12/24   Kennyth Domino, FNP  Insulin  Pen Needle (TRUEPLUS 5-BEVEL PEN NEEDLES) 32G X 4 MM MISC To use with Lantus  once daily Patient not taking: Reported on 08/26/2024 03/18/24   Mayers, Cari S, PA-C  levothyroxine  (SYNTHROID ) 75 MCG tablet Take 75 mcg by mouth.    [provider]  omeprazole  (PRILOSEC) 20 MG capsule Take 1 capsule (20 mg total) by mouth daily. 08/08/24   Kennyth Domino, FNP  sennosides-docusate sodium  (SENOKOT-S) 8.6-50 MG tablet Take 2 tablets by mouth daily. Patient not taking: Reported on 08/16/2024 11/10/20   Brown, Blaine K, PA-C  Family History Family History  Problem Relation Age of Onset   Cancer Paternal Grandmother        NOS   Pancreatic cancer Paternal Aunt    Cancer Paternal Aunt        NOS   Breast cancer Mother 78       Stage 3   Colon cancer Neg Hx    Rectal cancer Neg Hx    Stomach cancer Neg Hx     Social History Social History   Tobacco Use   Smoking status: Former   Smokeless tobacco: Never   Tobacco comments:    quit smoking 14 yrs ago  Vaping Use   Vaping status: Never Used  Substance Use Topics   Alcohol use: No   Drug use: No     Allergies   Pregabalin , Sulfonamide derivatives, and Vortioxetine   Review of Systems Review of Systems  Constitutional: Negative.   HENT: Negative.    Respiratory: Negative.    Cardiovascular: Negative.   Gastrointestinal: Negative.   Genitourinary: Negative.   Musculoskeletal:  Positive for back pain.  Psychiatric/Behavioral: Negative.       Physical Exam Triage Vital Signs ED Triage Vitals  Encounter Vitals Group     BP 09/13/24 1357 (!) 146/81     Girls Systolic BP Percentile --      Girls Diastolic BP Percentile --      Boys Systolic BP Percentile --      Boys Diastolic BP Percentile --      Pulse Rate 09/13/24 1357 66     Resp 09/13/24 1357 18     Temp 09/13/24 1357 98.1 F (36.7 C)     Temp Source  09/13/24 1357 Oral     SpO2 09/13/24 1357 98 %     Weight --      Height --      Head Circumference --      Peak Flow --      Pain Score 09/13/24 1400 8     Pain Loc --      Pain Education --      Exclude from Growth Chart --    No data found.  Updated Vital Signs BP (!) 146/81 (BP Location: Left Arm)   Pulse 66   Temp 98.1 F (36.7 C) (Oral)   Resp 18   SpO2 98%   Visual Acuity Right Eye Distance:   Left Eye Distance:   Bilateral Distance:    Right Eye Near:   Left Eye Near:    Bilateral Near:     Physical Exam Constitutional:      General: She is not in acute distress.    Appearance: Normal appearance. She is normal weight. She is not ill-appearing or toxic-appearing.  Cardiovascular:     Rate and Rhythm: Normal rate and regular rhythm.  Pulmonary:     Effort: Pulmonary effort is normal.     Breath sounds: Normal breath sounds.  Musculoskeletal:     Comments: No spinous tenderness noted;  no TTP to the upper back.  +TTP to the right anterior chest, shoulder, and upper arm;  decreased ROM to the right shoulder due to pain.   Neurological:     General: No focal deficit present.     Mental Status: She is alert.  Psychiatric:        Mood and Affect: Mood normal.      UC Treatments / Results  Labs (all labs ordered are listed, but only abnormal results  are displayed) Labs Reviewed - No data to display  EKG   Radiology No results found.  Procedures Procedures (including critical care time)  Medications Ordered in UC Medications  ketorolac  (TORADOL ) 30 MG/ML injection 30 mg (has no administration in time range)    Initial Impression / Assessment and Plan / UC Course  I have reviewed the triage vital signs and the nursing notes.  Pertinent labs & imaging results that were available during my care of the patient were reviewed by me and considered in my medical decision making (see chart for details).   Final Clinical Impressions(s) / UC Diagnoses    Final diagnoses:  Acute pain of right shoulder  Right arm pain     Discharge Instructions      You were seen today for right shoulder and arm pain.  I have given you a shot of toradol  today, and sent a script for prednisone to your pharmacy to help with pain.  Please follow up with the Pain Specialist as scheduled on the 30th.     ED Prescriptions     Medication Sig Dispense Auth. Provider   methylPREDNISolone  (MEDROL  DOSEPAK) 4 MG TBPK tablet Take as directed 1 each Darral Longs, MD      PDMP not reviewed this encounter.   Darral Longs, MD 09/13/24 1430

## 2024-09-13 NOTE — ED Triage Notes (Signed)
 Patient is here for right shoulder pain that radiating down to her right arm x 2 weeks.

## 2024-09-13 NOTE — Discharge Instructions (Addendum)
 You were seen today for right shoulder and arm pain.  I have given you a shot of toradol  today, and sent a script for prednisone to your pharmacy to help with pain.  Please follow up with the Pain Specialist as scheduled on the 30th.

## 2024-09-16 ENCOUNTER — Telehealth: Admitting: Nurse Practitioner

## 2024-09-16 ENCOUNTER — Other Ambulatory Visit: Payer: Self-pay

## 2024-09-16 ENCOUNTER — Other Ambulatory Visit (HOSPITAL_COMMUNITY): Payer: Self-pay

## 2024-09-16 ENCOUNTER — Encounter: Payer: Self-pay | Admitting: Nurse Practitioner

## 2024-09-16 ENCOUNTER — Other Ambulatory Visit: Admitting: Nurse Practitioner

## 2024-09-16 DIAGNOSIS — E1165 Type 2 diabetes mellitus with hyperglycemia: Secondary | ICD-10-CM

## 2024-09-16 DIAGNOSIS — Z794 Long term (current) use of insulin: Secondary | ICD-10-CM | POA: Diagnosis not present

## 2024-09-16 MED ORDER — OZEMPIC (0.25 OR 0.5 MG/DOSE) 2 MG/3ML ~~LOC~~ SOPN
0.5000 mg | PEN_INJECTOR | SUBCUTANEOUS | 0 refills | Status: AC
  Filled 2024-09-16: qty 3, fill #0
  Filled 2024-09-16 – 2024-09-19 (×3): qty 3, 28d supply, fill #0

## 2024-09-16 NOTE — Progress Notes (Signed)
 Established Patient Office Visit  Virtual Visit Consent:   [name], you are scheduled for a virtual visit with a [facility] provider today.     Just as with appointments in the office, your consent must be obtained to participate.  Your consent will be active for this visit and any virtual visit you may have with one of our providers in the next 365 days.     If you have a MyChart account, a copy of this consent can be sent to you electronically.  All virtual visits are billed to your insurance company just like a traditional visit in the office.    As this is a virtual visit, video technology does not allow for your provider to perform a traditional examination.  This may limit your provider's ability to fully assess your condition.  If your provider identifies any concerns that need to be evaluated in person or the need to arrange testing (such as labs, EKG, etc.), we will make arrangements to do so.     Although advances in technology are sophisticated, we cannot ensure that it will always work on either your end or our end.  If the connection with a video visit is poor, the visit may have to be switched to a telephone visit.  With either a video or telephone visit, we are not always able to ensure that we have a secure connection.     I need to obtain your verbal consent now.   Are you willing to proceed with your visit today?    Morgan Taylor has provided verbal consent on 09/16/2024 for a virtual visit (video or telephone).   Morgan Kitty, FNP  Date: 09/16/2024 10:07 AM  SUBJECTIVE  Patient ID: Morgan Taylor, female    DOB: [birth year]  Age: 63 y.o. MRN: [id number]  Morgan Taylor Morgan Taylor, connected with  Morgan Taylor  ([id number], Mar 20, 1961) on 09/16/24 at 10:00 AM EDT by a video-enabled telemedicine application and verified that I am speaking with the correct person using two identifiers.    Location: Patient: [facility]  Provider: Virtual Visit Location Provider: Home Office   I  discussed the limitations of evaluation and management by telemedicine and the availability of in person appointments. The patient expressed understanding and agreed to proceed.      HPI Morgan Taylor is a 63 y.o. who identifies as a female who was assigned female at birth, and is being seen today for follow up after starting Ozempic . Yesterday she took her 4th injection  Blood Glucose has remained in 300s (has not been checked in the past 2 weeks)  Has not tolerated Metformin in the past caused N/V  She is planning to pick up her glucose monitor from [facility] today and her BP cuff from [facility] today   She is taking her medications somewhat consistently. She will get her BP monitored by [facility] when she can, along with glucose.   She does note that she is out of her Adderall that she has been getting from psychiatry - is planning to follow up with them   Denies SE from Ozempic   Denies any contraindications to use  Continues to work on housing needs    Review of Systems  Constitutional: Negative.   Cardiovascular: Negative.   Genitourinary: Negative.   Skin: Negative.   Psychiatric/Behavioral: Negative.        Objective:     There were no vitals taken for this visit. BP Readings from Last  3 Encounters:  09/13/24 (!) 146/81  08/26/24 138/81  08/22/24 (!) 157/93      Physical Exam Constitutional:      Appearance: Normal appearance.  HENT:     Head: Normocephalic.     Nose: Nose normal.  Pulmonary:     Effort: Pulmonary effort is normal.  Musculoskeletal:     Cervical back: Neck supple.  Neurological:     Mental Status: She is alert and oriented to person, place, and time.  Psychiatric:        Mood and Affect: Mood normal.      No results found for any visits on 09/16/24.  Last metabolic panel Lab Results  Component Value Date   GLUCOSE 222 (H) 08/12/2024   NA 139 08/12/2024   K 4.1 08/12/2024   CL 99 08/12/2024   CO2 23 08/12/2024   BUN 15 08/12/2024    CREATININE 0.62 08/12/2024   EGFR 101 08/12/2024   CALCIUM  9.4 08/12/2024   PROT 6.8 08/12/2024   ALBUMIN  4.0 08/12/2024   LABGLOB 2.8 08/12/2024   BILITOT 0.5 08/12/2024   ALKPHOS 90 08/12/2024   AST 10 08/12/2024   ALT 12 08/12/2024   ANIONGAP 12 01/23/2024   Last hemoglobin A1c Lab Results  Component Value Date   HGBA1C 10.7 (H) 08/12/2024      The 10-year ASCVD risk score (Arnett DK, et al., 2019) is: 11.9%    Assessment & Plan:     1. Type 2 diabetes mellitus with hyperglycemia, with long-term current use of insulin  (HCC)  Increase to 0.5mg  once weekly for 4 weeks   - Semaglutide ,0.25 or 0.5MG /DOS, (OZEMPIC , 0.25 OR 0.5 MG/DOSE,) 2 MG/3ML SOPN; Inject 0.5 mg into the skin once a week for 4 doses.  Dispense: 3 mL; Refill: 0 - Comprehensive metabolic panel  2. Uncontrolled type 2 diabetes mellitus with hyperglycemia (HCC)  - Semaglutide ,0.25 or 0.5MG /DOS, (OZEMPIC , 0.25 OR 0.5 MG/DOSE,) 2 MG/3ML SOPN; Inject 0.5 mg into the skin once a week for 4 doses.  Dispense: 3 mL; Refill: 0 - Comprehensive metabolic panel    Return to office in one month for follow up Will have Video visit in 2 weeks to check glucose and blood pressure readings at home  Earlier with any concerns  Follow Up Instructions:  I discussed the assessment and treatment plan with the patient. The patient was provided an opportunity to ask questions and all were answered. The patient agreed with the plan and demonstrated an understanding of the instructions.  A copy of instructions were sent to the patient via MyChart unless otherwise noted below.    The patient was advised to call back or seek an in-person evaluation if the symptoms worsen or if the condition fails to improve as anticipated.   Morgan Kitty, FNP  **Disclaimer: This note may have been dictated with voice recognition software. Similar sounding words can inadvertently be transcribed and this note may contain transcription errors which  may not have been corrected upon publication of note.**

## 2024-09-18 ENCOUNTER — Other Ambulatory Visit: Payer: Self-pay

## 2024-09-19 ENCOUNTER — Other Ambulatory Visit: Admitting: Nurse Practitioner

## 2024-09-19 ENCOUNTER — Telehealth: Payer: Self-pay | Admitting: Nurse Practitioner

## 2024-09-19 ENCOUNTER — Other Ambulatory Visit: Payer: Self-pay

## 2024-09-19 NOTE — Telephone Encounter (Signed)
 Spoke with patient regarding scheduled appointment today. She is unable to make it due to an overlapping appointment at the [facility]. She also was told it was too early for an Ozempic  refill- will message pharmacy for clarity and reschedule patient for Monday in office

## 2024-09-23 ENCOUNTER — Other Ambulatory Visit: Admitting: Nurse Practitioner

## 2024-09-24 ENCOUNTER — Other Ambulatory Visit (HOSPITAL_COMMUNITY): Payer: Self-pay

## 2024-09-24 MED ORDER — OXYCODONE-ACETAMINOPHEN 7.5-325 MG PO TABS
1.0000 | ORAL_TABLET | Freq: Three times a day (TID) | ORAL | 0 refills | Status: AC | PRN
  Filled 2024-09-24: qty 90, 30d supply, fill #0

## 2024-09-24 MED ORDER — OXYCODONE-ACETAMINOPHEN 7.5-325 MG PO TABS: 1.0000 | ORAL_TABLET | Freq: Three times a day (TID) | ORAL | 0 refills | Status: AC | PRN

## 2024-09-26 ENCOUNTER — Other Ambulatory Visit: Payer: Self-pay

## 2024-09-27 ENCOUNTER — Telehealth: Payer: Self-pay

## 2024-09-27 ENCOUNTER — Other Ambulatory Visit: Payer: Self-pay

## 2024-09-27 NOTE — Progress Notes (Signed)
   09/27/2024  Patient ID: Morgan Taylor, female   DOB: [birth year], 63 y.o.   MRN: [id number]  Attempted to contact patient for scheduled appointment for medication management. Left HIPAA compliant message for patient to return my call at their convenience.   [name], PharmD Clinical Pharmacist [phone]

## 2024-09-30 ENCOUNTER — Other Ambulatory Visit: Payer: Self-pay

## 2024-09-30 ENCOUNTER — Telehealth: Admitting: Nurse Practitioner

## 2024-10-03 ENCOUNTER — Telehealth: Admitting: Nurse Practitioner

## 2024-10-03 LAB — GLUCOSE, POCT (MANUAL RESULT ENTRY): POC Glucose: 324 mg/dL — AB (ref 70–99)

## 2024-10-03 NOTE — Congregational Nurse Program (Signed)
  Dept: [phone]   Congregational Nurse Program Note  Date of Encounter: 10/03/2024  Past Medical History: Past Medical History:  Diagnosis Date   Anxiety    Arthritis    Breast cancer (HCC)    Cancer (HCC) 2009   lumpectomy   Depression    takes Remeron  nightly   Diabetes mellitus    takes Lantus  and Victoza . Average fasting blood sugar runs 89   GERD (gastroesophageal reflux disease)    takes Omeprazole  daily   Hyperlipemia    takes Lipitor daily   Hypertension    takes Amlodipine  and Losartan  daily   Hypothyroidism    takes Synthroid  daily   Neck pain    Numbness    in both hands   Personal history of radiation therapy    Primary localized osteoarthrosis of right shoulder 03/21/2017    Encounter Details:  Community Questionnaire - 10/03/24 1326       Questionnaire   Ask client: Do you give verbal consent for me to treat you today? Yes    Student Assistance N/A    Location Patient Served  [facility]    Encounter Setting CN site    Population Status Unhoused    Insurance Medicaid    Insurance/Financial Assistance Referral N/A    Medication Patient Medications Reviewed    Medical Provider Yes    Screening Referrals Made N/A    Medical Referrals Made N/A    Medical Appointment Completed N/A    CNP Interventions Advocate/Support;Navigate Healthcare System;Case Management;Counsel;Educate    Screenings CN Performed Blood Glucose    ED Visit Averted N/A    Life-Saving Intervention Made N/A          Client to RN office for blood glucose check as requested by her PCP. CBG [phone]. Client also states she ate a cupcake. PCP Kennyth made aware. Client also is going to go to pharmacy and pick up medicine for her blood sugar. No further concerns at this time.

## 2024-10-06 ENCOUNTER — Emergency Department (HOSPITAL_COMMUNITY)
Admission: EM | Admit: 2024-10-06 | Discharge: 2024-10-07 | Disposition: A | Discharge status: Home / Self Care | Attending: Emergency Medicine | Admitting: Emergency Medicine

## 2024-10-06 ENCOUNTER — Emergency Department (HOSPITAL_COMMUNITY)

## 2024-10-06 ENCOUNTER — Encounter (HOSPITAL_COMMUNITY): Payer: Self-pay | Admitting: *Deleted

## 2024-10-06 ENCOUNTER — Other Ambulatory Visit: Payer: Self-pay

## 2024-10-06 DIAGNOSIS — Z794 Long term (current) use of insulin: Secondary | ICD-10-CM | POA: Diagnosis not present

## 2024-10-06 DIAGNOSIS — R739 Hyperglycemia, unspecified: Secondary | ICD-10-CM | POA: Insufficient documentation

## 2024-10-06 DIAGNOSIS — R079 Chest pain, unspecified: Secondary | ICD-10-CM | POA: Insufficient documentation

## 2024-10-06 LAB — BASIC METABOLIC PANEL WITH GFR
Anion gap: 10 (ref 5–15)
BUN: 21 mg/dL (ref 8–23)
CO2: 25 mmol/L (ref 22–32)
Calcium: 8.9 mg/dL (ref 8.9–10.3)
Chloride: 102 mmol/L (ref 98–111)
Creatinine, Ser: 0.76 mg/dL (ref 0.44–1.00)
GFR, Estimated: >60 mL/min (ref >60)
Glucose, Bld: 354 mg/dL — ABNORMAL HIGH (ref 70–99)
Potassium: 3.3 mmol/L — ABNORMAL LOW (ref 3.5–5.1)
Sodium: 137 mmol/L (ref 135–145)

## 2024-10-06 LAB — TROPONIN I (HIGH SENSITIVITY): Troponin I (High Sensitivity): 5 ng/L (ref <18)

## 2024-10-06 LAB — CBC
HCT: 37.4 % (ref 36.0–46.0)
Hemoglobin: 12 g/dL (ref 12.0–15.0)
MCH: 29.5 pg (ref 26.0–34.0)
MCHC: 32.1 g/dL (ref 30.0–36.0)
MCV: 91.9 fL (ref 80.0–100.0)
Platelets: 241 K/uL (ref 150–400)
RBC: 4.07 MIL/uL (ref 3.87–5.11)
RDW: 13.4 % (ref 11.5–15.5)
WBC: 6.7 K/uL (ref 4.0–10.5)
nRBC: 0 % (ref 0.0–0.2)

## 2024-10-06 NOTE — ED Triage Notes (Signed)
 Pt reports Chest pain and all over body pain x3 days. No SHOB. Pt also thinks she may have a UTI.

## 2024-10-07 ENCOUNTER — Telehealth: Admitting: Nurse Practitioner

## 2024-10-07 LAB — URINALYSIS, W/ REFLEX TO CULTURE (INFECTION SUSPECTED)
Bilirubin Urine: NEGATIVE
Glucose, UA: >=500 mg/dL — AB
Hgb urine dipstick: NEGATIVE
Ketones, ur: NEGATIVE mg/dL
Nitrite: NEGATIVE
Protein, ur: NEGATIVE mg/dL
Specific Gravity, Urine: 1.021 (ref 1.005–1.030)
pH: 6 (ref 5.0–8.0)

## 2024-10-07 LAB — TROPONIN I (HIGH SENSITIVITY): Troponin I (High Sensitivity): 5 ng/L (ref <18)

## 2024-10-07 LAB — CBG MONITORING, ED: Glucose-Capillary: 267 mg/dL — ABNORMAL HIGH (ref 70–99)

## 2024-10-07 NOTE — Discharge Instructions (Addendum)
 I suspect that a lot of your symptoms are coming from your uncontrolled diabetes.  Following up with endocrinology will certainly help with this.

## 2024-10-07 NOTE — ED Provider Notes (Signed)
 [place] EMERGENCY DEPARTMENT AT [facility] Provider Note   CSN: [id number] Arrival date & time: 10/06/24  2213     Patient presents with: Chest Pain   Morgan Taylor is a 63 y.o. female.   Presents to the emergency department for evaluation of multiple problems.  Patient reports that for the last 3 days she has been having intermittent chest pains.  She reports that randomly she will get a pain in the center of her chest that will then radiate to the shoulders, back and arms.  It last for 2 or 3 minutes, sometimes less.  Nothing seems to cause the symptoms.  No shortness of breath.  Patient also reports that her blood sugars have been running high.  She was taken off of her Lantus  and put on Ozempic  in July.  Blood sugars have been running high.  She does, however, have a pending appointment with endocrinology.  Lastly, patient reports that her urine has been foul-smelling and there has been sand in it.  She thinks she might have a UTI.       Prior to Admission medications   Medication Sig Start Date End Date Taking? Authorizing Provider  Accu-Chek Softclix Lancets lancets Use to check blood sugar once daily Dx E11.9 09/06/24   Kennyth Domino, FNP  amLODipine  (NORVASC ) 10 MG tablet Take 1 tablet (10 mg total) by mouth daily. 08/08/24   Kennyth Domino, FNP  atorvastatin  (LIPITOR) 20 MG tablet Take 1 tablet (20 mg total) by mouth daily. 08/08/24   Kennyth Domino, FNP  azelastine  (ASTELIN ) 0.1 % nasal spray Place 2 sprays into both nostrils 2 (two) times daily. Use in each nostril as directed 08/09/24   Johnie Flaming A, NP  Blood Glucose Monitoring Suppl (ACCU-CHEK GUIDE ME) w/Device KIT Use to check blood sugar once daily Dx E11.9 09/06/24   Kennyth Domino, FNP  Blood Glucose Monitoring Suppl (ACCU-CHEK GUIDE) w/Device KIT Use as directed to check blood sugar up to 3 times per day. 08/12/24   Kennyth Domino, FNP  diclofenac  (VOLTAREN ) 75 MG EC tablet Take 1 tablet (75 mg total) by  mouth daily. 09/05/24   Kennyth Domino, FNP  glucose blood (ACCU-CHEK GUIDE TEST) test strip Check blood glucose 3 times daily. 08/12/24   Kennyth Domino, FNP  glucose blood (ACCU-CHEK GUIDE TEST) test strip Use to check blood sugar once daily Dx E11.9 09/06/24   Kennyth Domino, FNP  glucose blood test strip Use 1 strip daily as directed. 07/28/23     Hearing Aid Device DEVI 2 Units by Does not apply route daily. 08/12/24   Kennyth Domino, FNP  Insulin  Pen Needle (TRUEPLUS 5-BEVEL PEN NEEDLES) 32G X 4 MM MISC To use with Lantus  once daily Patient not taking: Reported on 08/26/2024 03/18/24   Mayers, Kirk RAMAN, PA-C  levothyroxine  (SYNTHROID ) 75 MCG tablet Take 75 mcg by mouth.    [provider]  losartan  (COZAAR ) 100 MG tablet Take 1 tablet (100 mg total) by mouth daily. 08/08/24   Kennyth Domino, FNP  methylPREDNISolone  (MEDROL  DOSEPAK) 4 MG TBPK tablet Take as directed 09/13/24   Darral Longs, MD  omeprazole  (PRILOSEC) 20 MG capsule Take 1 capsule (20 mg total) by mouth daily. 08/08/24   Kennyth Domino, FNP  oxyCODONE -acetaminophen  (PERCOCET) 7.5-325 MG tablet Take 1 tablet by mouth every 8 (eight) hours as needed for pain. 10/24/24     oxyCODONE -acetaminophen  (PERCOCET) 7.5-325 MG tablet Take 1 tablet by mouth every 8 (eight) hours as needed for Pain.  09/24/24     Semaglutide ,0.25 or 0.5MG /DOS, (OZEMPIC , 0.25 OR 0.5 MG/DOSE,) 2 MG/3ML SOPN Inject 0.5 mg into the skin once a week for 4 doses. 09/16/24 10/08/24  Kennyth Domino, FNP  sennosides-docusate sodium  (SENOKOT-S) 8.6-50 MG tablet Take 2 tablets by mouth daily. Patient not taking: Reported on 08/16/2024 11/10/20   Brown, Blaine K, PA-C  sertraline  (ZOLOFT ) 100 MG tablet Take 1 tablet (100 mg total) by mouth daily for anxiety/depression. 04/10/24       Allergies: Pregabalin , Sulfonamide derivatives, and Vortioxetine    Review of Systems  Updated Vital Signs BP (!) 147/76   Pulse 61   Temp 98.2 F (36.8 C) (Oral)   Resp 14   Ht 5' 2 (1.575 m)    Wt 69.9 kg   SpO2 100%   BMI 28.19 kg/m   Physical Exam Vitals and nursing note reviewed.  Constitutional:      General: She is not in acute distress.    Appearance: She is well-developed.  HENT:     Head: Normocephalic and atraumatic.     Mouth/Throat:     Mouth: Mucous membranes are moist.  Eyes:     General: Vision grossly intact. Gaze aligned appropriately.     Extraocular Movements: Extraocular movements intact.     Conjunctiva/sclera: Conjunctivae normal.  Cardiovascular:     Rate and Rhythm: Normal rate and regular rhythm.     Pulses: Normal pulses.     Heart sounds: Normal heart sounds, S1 normal and S2 normal. No murmur heard.    No friction rub. No gallop.  Pulmonary:     Effort: Pulmonary effort is normal. No respiratory distress.     Breath sounds: Normal breath sounds.  Abdominal:     General: Bowel sounds are normal.     Palpations: Abdomen is soft.     Tenderness: There is no abdominal tenderness. There is no guarding or rebound.     Hernia: No hernia is present.  Musculoskeletal:        General: No swelling.     Cervical back: Full passive range of motion without pain, normal range of motion and neck supple. No spinous process tenderness or muscular tenderness. Normal range of motion.     Right lower leg: No edema.     Left lower leg: No edema.  Skin:    General: Skin is warm and dry.     Capillary Refill: Capillary refill takes less than 2 seconds.     Findings: No ecchymosis, erythema, rash or wound.  Neurological:     General: No focal deficit present.     Mental Status: She is alert and oriented to person, place, and time.     GCS: GCS eye subscore is 4. GCS verbal subscore is 5. GCS motor subscore is 6.     Cranial Nerves: Cranial nerves 2-12 are intact.     Sensory: Sensation is intact.     Motor: Motor function is intact.     Coordination: Coordination is intact.  Psychiatric:        Attention and Perception: Attention normal.        Mood and  Affect: Mood normal.        Speech: Speech normal.        Behavior: Behavior normal.     (all labs ordered are listed, but only abnormal results are displayed) Labs Reviewed  BASIC METABOLIC PANEL WITH GFR - Abnormal; Notable for the following components:      Result Value  Potassium 3.3 (*)    Glucose, Bld 354 (*)    All other components within normal limits  URINALYSIS, W/ REFLEX TO CULTURE (INFECTION SUSPECTED) - Abnormal; Notable for the following components:   APPearance CLOUDY (*)    Glucose, UA >=500 (*)    Leukocytes,Ua SMALL (*)    Bacteria, UA FEW (*)    All other components within normal limits  CBG MONITORING, ED - Abnormal; Notable for the following components:   Glucose-Capillary 267 (*)    All other components within normal limits  CBC  TROPONIN I (HIGH SENSITIVITY)  TROPONIN I (HIGH SENSITIVITY)    EKG: EKG Interpretation Date/Time:  Sunday October 06 2024 22:24:25 EDT Ventricular Rate:  68 PR Interval:  128 QRS Duration:  88 QT Interval:  426 QTC Calculation: 452 R Axis:   99  Text Interpretation: Normal sinus rhythm Rightward axis ST & T wave abnormality, consider inferolateral ischemia Abnormal ECG When compared with ECG of 23-Jan-2024 13:40, No acute changes Confirmed by Haze Lonni PARAS ([id number]) on 10/06/2024 11:54:17 PM  Radiology: [facility] Chest 2 View Result Date: 10/06/2024 CLINICAL DATA:  Chest pain, cough EXAM: CHEST - 2 VIEW COMPARISON:  01/23/2024 FINDINGS: Heart and mediastinal contours are within normal limits. No focal opacities or effusions. No acute bony abnormality. Bilateral shoulder replacements. IMPRESSION: No active cardiopulmonary disease. Electronically Signed   By: Franky Crease M.D.   On: 10/06/2024 22:48     Procedures   Medications Ordered in the ED - No data to display                                  Medical Decision Making  Differential diagnosis considered includes, but not limited to: Cardiac chest pain;  noncardiac chest pain; pneumonia; UTI; hyperglycemia; DKA  Presents with multiple complaints.  Patient complaining of chest pain.  Pain seems noncardiac in nature, as it occurs intermittently at rest and only last for a couple of minutes, then resolves.  Cardiac evaluation has been reassuring including EKG and 2 troponins.  Patient's blood sugar is elevated.  It has come down somewhat on its own here in the ED.  Patient currently only on Ozempic .  It seems that she will need better blood sugar control in the near future, and fortunately has follow-up with endocrinology coming up.  No evidence of DKA or hyperosmolar state.  Patient reports that her urine has been foul-smelling but no other specific urinary symptoms.  Urinalysis without evidence of UTI.     Final diagnoses:  Chest pain, unspecified type  Hyperglycemia    ED Discharge Orders     None          Haze Lonni PARAS, MD 10/07/24 [phone]

## 2024-10-10 ENCOUNTER — Ambulatory Visit: Admitting: Nurse Practitioner

## 2024-10-17 ENCOUNTER — Other Ambulatory Visit (HOSPITAL_COMMUNITY): Payer: Self-pay

## 2024-10-24 NOTE — Progress Notes (Signed)
 The patient was seen at [facility] on 09/05/2024. Her blood pressure was not taken at this visit. During the event, the patient reported that she does not smoke, has insurance, is established with a primary care provider [name], FNP - [place]), and does have a food, housing, and transportation need.  A chart review indicates [name], FNP as her PCP. The pt had a f/u appt with her PCP on 09/16/2024. She has missed appts since this last visit and does not have any [facility] visible upcoming appts with her PCP indicated at this time per initial f/u. The pt is also seen by Congregational Nurse, Aloysius Ford at the [facility] for additional care and SDOH support. Pt was last seen with her on 10/03/2024. On 08/29/2024 encounter notes mention that the pt is working towards stable housing and is close to obtaining permanent housing. Chart review further shows that the pt has Medicaid and Medicare for her insurance. She has a food, housing, and transportation need. Chart review further indicates that the pt is a former smoker.  Call Attempt #1: CHW called pt but was unable to reach her at this time.  Call Attempt #2: CHW called pt again and was unable to leave a vm again.  Call Attempt #3: CHW called pt for third attempt, CHW was unable to reach the pt at this number.  CHW in basket CN Raven Ford about pt for additional SDOH support. CN Raven shared that the pt recent received her SSDI approval letter to support her SDOH needs and is in the process of obtaining housing. CHW sent letter to pt with additional food and transportation resources just in case needed.  An additional follow up will be done in according to the health equity team's protocol.

## 2024-11-19 ENCOUNTER — Other Ambulatory Visit (HOSPITAL_COMMUNITY): Payer: Self-pay

## 2024-11-19 MED ORDER — CELECOXIB 100 MG PO CAPS
100.0000 mg | ORAL_CAPSULE | Freq: Two times a day (BID) | ORAL | 0 refills | Status: AC
  Filled 2024-11-19: qty 60, 30d supply, fill #0

## 2024-11-29 ENCOUNTER — Other Ambulatory Visit (HOSPITAL_COMMUNITY): Payer: Self-pay

## 2024-12-17 NOTE — Progress Notes (Signed)
 Per initial f/u, The patient was seen at [facility] on 09/05/2024. Her blood pressure was not taken at this visit. During the event, the patient reported that she does not smoke, has insurance, is established with a primary care provider [name], FNP - [facility]), and does have a food, housing, and transportation need.   A chart review indicates Morgan Kitty, FNP as her PCP. The pt had a f/u appt with her PCP on 09/16/2024. She has missed appts since this last visit and does not have any CH[facility] visible upcoming appts with her PCP indicated at this time per initial f/u. The pt is also seen by Congregational Nurse, Aloysius Ford at the [facility] for additional care and SDOH support. Pt was last seen with her on 10/03/2024. On 08/29/2024 encounter notes mention that the pt is working towards stable housing and is close to obtaining permanent housing. Chart review further shows that the pt has Medicaid and Medicare for her insurance. She has a food, housing, and transportation need. Chart review further indicates that the pt is a former smoker.   Call Attempt #1: CHW called pt but was unable to reach her at this time.   Call Attempt #2: CHW called pt again and was unable to leave a vm again.   Call Attempt #3: CHW called pt for third attempt, CHW was unable to reach the pt at this number.   CHW in basket CN Raven Ford about pt for additional SDOH support. CN Raven shared that the pt recent received her SSDI approval letter to support her SDOH needs and is in the process of obtaining housing. CHW sent letter to pt with additional food and transportation resources just in case needed.  During the 60 day f/u interval, chart review shows that the pt has not been seen since initial f/u interval relating to SDOH and other healthcare needs.  60 Day Call Attempt: CHW called pt to obtain SDOH status. Pt was unable to be reached at this time due to number not being in service.   CHW sent  letter and email during the 60 day f/u to pt with food and transportation resources just in case needed. CHW in basket IRC CN, Mitzie Breen, RN to in form her about pt's f/u and SDOH needs in case she returns to [facility].  An additional follow up will be done in according to the health equity team's protocol.

## 2024-12-19 ENCOUNTER — Emergency Department (HOSPITAL_COMMUNITY)
Admission: EM | Admit: 2024-12-19 | Discharge: 2024-12-20 | Disposition: A | Discharge status: Home / Self Care | Attending: Emergency Medicine | Admitting: Emergency Medicine

## 2024-12-19 ENCOUNTER — Other Ambulatory Visit: Payer: Self-pay

## 2024-12-19 ENCOUNTER — Emergency Department (HOSPITAL_COMMUNITY)

## 2024-12-19 DIAGNOSIS — Z794 Long term (current) use of insulin: Secondary | ICD-10-CM | POA: Insufficient documentation

## 2024-12-19 DIAGNOSIS — Z853 Personal history of malignant neoplasm of breast: Secondary | ICD-10-CM | POA: Insufficient documentation

## 2024-12-19 DIAGNOSIS — M545 Low back pain, unspecified: Secondary | ICD-10-CM | POA: Diagnosis not present

## 2024-12-19 DIAGNOSIS — Z79899 Other long term (current) drug therapy: Secondary | ICD-10-CM | POA: Diagnosis not present

## 2024-12-19 DIAGNOSIS — E119 Type 2 diabetes mellitus without complications: Secondary | ICD-10-CM | POA: Insufficient documentation

## 2024-12-19 DIAGNOSIS — F419 Anxiety disorder, unspecified: Secondary | ICD-10-CM | POA: Diagnosis not present

## 2024-12-19 DIAGNOSIS — E039 Hypothyroidism, unspecified: Secondary | ICD-10-CM | POA: Insufficient documentation

## 2024-12-19 DIAGNOSIS — I10 Essential (primary) hypertension: Secondary | ICD-10-CM | POA: Insufficient documentation

## 2024-12-19 DIAGNOSIS — R0602 Shortness of breath: Secondary | ICD-10-CM

## 2024-12-19 LAB — CBC WITH DIFFERENTIAL/PLATELET
Abs Immature Granulocytes: 0.01 K/uL (ref 0.00–0.07)
Basophils Absolute: 0 K/uL (ref 0.0–0.1)
Basophils Relative: 1 %
Eosinophils Absolute: 0.1 K/uL (ref 0.0–0.5)
Eosinophils Relative: 1 %
HCT: 43.4 % (ref 36.0–46.0)
Hemoglobin: 13.9 g/dL (ref 12.0–15.0)
Immature Granulocytes: 0 %
Lymphocytes Relative: 31 %
Lymphs Abs: 2.1 K/uL (ref 0.7–4.0)
MCH: 29.5 pg (ref 26.0–34.0)
MCHC: 32 g/dL (ref 30.0–36.0)
MCV: 92.1 fL (ref 80.0–100.0)
Monocytes Absolute: 0.5 K/uL (ref 0.1–1.0)
Monocytes Relative: 7 %
Neutro Abs: 4.2 K/uL (ref 1.7–7.7)
Neutrophils Relative %: 60 %
Platelets: 251 K/uL (ref 150–400)
RBC: 4.71 MIL/uL (ref 3.87–5.11)
RDW: 13 % (ref 11.5–15.5)
WBC: 6.9 K/uL (ref 4.0–10.5)
nRBC: 0 % (ref 0.0–0.2)

## 2024-12-19 LAB — COMPREHENSIVE METABOLIC PANEL WITH GFR
ALT: 14 U/L (ref 0–44)
AST: 20 U/L (ref 15–41)
Albumin: 3.9 g/dL (ref 3.5–5.0)
Alkaline Phosphatase: 93 U/L (ref 38–126)
Anion gap: 9 (ref 5–15)
BUN: 25 mg/dL — ABNORMAL HIGH (ref 8–23)
CO2: 29 mmol/L (ref 22–32)
Calcium: 9.5 mg/dL (ref 8.9–10.3)
Chloride: 99 mmol/L (ref 98–111)
Creatinine, Ser: 0.79 mg/dL (ref 0.44–1.00)
GFR, Estimated: >60 mL/min
Glucose, Bld: 381 mg/dL — ABNORMAL HIGH (ref 70–99)
Potassium: 4.1 mmol/L (ref 3.5–5.1)
Sodium: 137 mmol/L (ref 135–145)
Total Bilirubin: 0.2 mg/dL (ref 0.0–1.2)
Total Protein: 7.5 g/dL (ref 6.5–8.1)

## 2024-12-19 LAB — RESP PANEL BY RT-PCR (RSV, FLU A&B, COVID)  RVPGX2
Influenza A by PCR: NEGATIVE
Influenza B by PCR: NEGATIVE
Resp Syncytial Virus by PCR: NEGATIVE
SARS Coronavirus 2 by RT PCR: NEGATIVE

## 2024-12-19 LAB — URINALYSIS, ROUTINE W REFLEX MICROSCOPIC
Bilirubin Urine: NEGATIVE
Glucose, UA: >=500 mg/dL — AB
Hgb urine dipstick: NEGATIVE
Ketones, ur: 5 mg/dL — AB
Leukocytes,Ua: NEGATIVE
Nitrite: NEGATIVE
Protein, ur: 30 mg/dL — AB
Specific Gravity, Urine: 1.012 (ref 1.005–1.030)
pH: 8 (ref 5.0–8.0)

## 2024-12-19 LAB — TROPONIN T, HIGH SENSITIVITY: Troponin T High Sensitivity: <15 ng/L (ref 0–19)

## 2024-12-19 MED ORDER — IOHEXOL 350 MG/ML SOLN
75.0000 mL | Freq: Once | INTRAVENOUS | Status: AC | PRN
  Administered 2024-12-19: 75 mL via INTRAVENOUS

## 2024-12-19 NOTE — ED Provider Notes (Signed)
 " [place] EMERGENCY DEPARTMENT AT [facility] Provider Note   CSN: [id number] Arrival date & time: 12/19/24  1710     Patient presents with: No chief complaint on file.   Morgan Taylor is a 63 y.o. female.  {Add pertinent medical, surgical, social history, OB history to HPI:32947} HPI     Prior to Admission medications  Medication Sig Start Date End Date Taking? Authorizing Provider  Accu-Chek Softclix Lancets lancets Use to check blood sugar once daily Dx E11.9 09/06/24   Kennyth Domino, FNP  amLODipine  (NORVASC ) 10 MG tablet Take 1 tablet (10 mg total) by mouth daily. 08/08/24   Kennyth Domino, FNP  atorvastatin  (LIPITOR) 20 MG tablet Take 1 tablet (20 mg total) by mouth daily. 08/08/24   Kennyth Domino, FNP  azelastine  (ASTELIN ) 0.1 % nasal spray Place 2 sprays into both nostrils 2 (two) times daily. Use in each nostril as directed 08/09/24   Johnie Flaming A, NP  Blood Glucose Monitoring Suppl (ACCU-CHEK GUIDE ME) w/Device KIT Use to check blood sugar once daily Dx E11.9 09/06/24   Kennyth Domino, FNP  Blood Glucose Monitoring Suppl (ACCU-CHEK GUIDE) w/Device KIT Use as directed to check blood sugar up to 3 times per day. 08/12/24   Kennyth Domino, FNP  celecoxib  (CELEBREX ) 100 MG capsule Take 1 capsule (100 mg total) by mouth 2 (two) times daily. 11/19/24     diclofenac  (VOLTAREN ) 75 MG EC tablet Take 1 tablet (75 mg total) by mouth daily. 09/05/24   Kennyth Domino, FNP  glucose blood (ACCU-CHEK GUIDE TEST) test strip Check blood glucose 3 times daily. 08/12/24   Kennyth Domino, FNP  glucose blood (ACCU-CHEK GUIDE TEST) test strip Use to check blood sugar once daily Dx E11.9 09/06/24   Kennyth Domino, FNP  glucose blood test strip Use 1 strip daily as directed. 07/28/23     Hearing Aid Device DEVI 2 Units by Does not apply route daily. 08/12/24   Kennyth Domino, FNP  Insulin  Pen Needle (TRUEPLUS 5-BEVEL PEN NEEDLES) 32G X 4 MM MISC To use with Lantus  once daily Patient not taking:  Reported on 08/26/2024 03/18/24   Mayers, Cari S, PA-C  levothyroxine  (SYNTHROID ) 75 MCG tablet Take 75 mcg by mouth.    [provider]  losartan  (COZAAR ) 100 MG tablet Take 1 tablet (100 mg total) by mouth daily. 08/08/24   Kennyth Domino, FNP  methylPREDNISolone  (MEDROL  DOSEPAK) 4 MG TBPK tablet Take as directed 09/13/24   Darral Longs, MD  omeprazole  (PRILOSEC) 20 MG capsule Take 1 capsule (20 mg total) by mouth daily. 08/08/24   Kennyth Domino, FNP  oxyCODONE -acetaminophen  (PERCOCET) 7.5-325 MG tablet Take 1 tablet by mouth every 8 (eight) hours as needed for pain. 10/24/24     oxyCODONE -acetaminophen  (PERCOCET) 7.5-325 MG tablet Take 1 tablet by mouth every 8 (eight) hours as needed for Pain. 09/24/24     sennosides-docusate sodium  (SENOKOT-S) 8.6-50 MG tablet Take 2 tablets by mouth daily. Patient not taking: Reported on 08/16/2024 11/10/20   Brown, Blaine K, PA-C  sertraline  (ZOLOFT ) 100 MG tablet Take 1 tablet (100 mg total) by mouth daily for anxiety/depression. 04/10/24       Allergies: Pregabalin , Sulfonamide derivatives, and Vortioxetine    Review of Systems  Updated Vital Signs BP 138/79 (BP Location: Right Arm)   Pulse 82   Temp 98.3 F (36.8 C) (Oral)   Resp 18   Ht 5' 2 (1.575 m)   Wt 69 kg   SpO2 98%  BMI 27.82 kg/m   Physical Exam  (all labs ordered are listed, but only abnormal results are displayed) Labs Reviewed  URINALYSIS, ROUTINE W REFLEX MICROSCOPIC - Abnormal; Notable for the following components:      Result Value   Color, Urine STRAW (*)    Glucose, UA >=500 (*)    Ketones, ur 5 (*)    Protein, ur 30 (*)    Bacteria, UA RARE (*)    All other components within normal limits  RESP PANEL BY RT-PCR (RSV, FLU A&B, COVID)  [id number]  URINE CULTURE  CBC WITH DIFFERENTIAL/PLATELET  COMPREHENSIVE METABOLIC PANEL WITH GFR  URINALYSIS, W/ REFLEX TO CULTURE (INFECTION SUSPECTED)    EKG: None  Radiology: DG Chest 2 View Result Date: 12/19/2024 EXAM:  2 VIEW(S) XRAY OF THE CHEST 12/19/2024 06:44:00 PM COMPARISON: 10/06/2024 CLINICAL HISTORY: FINDINGS: LUNGS AND PLEURA: No focal pulmonary opacity. No pleural effusion. No pneumothorax. HEART AND MEDIASTINUM: No acute abnormality of the cardiac and mediastinal silhouettes. BONES AND SOFT TISSUES: Bilateral shoulder arthroplasty noted. Mild degenerative changes of thoracic spine. IMPRESSION: 1. No acute process. Electronically signed by: Norman Gatlin MD 12/19/2024 06:48 PM EST RP Workstation: [id number]    {Document cardiac monitor, telemetry assessment procedure when appropriate:32947} Procedures   Medications Ordered in the ED - No data to display    {Click here for ABCD2, HEART and other calculators REFRESH Note before signing:1}                              Medical Decision Making Amount and/or Complexity of Data Reviewed Labs: ordered. Radiology: ordered.   ***  {Document critical care time when appropriate  Document review of labs and clinical decision tools ie CHADS2VASC2, etc  Document your independent review of radiology images and any outside records  Document your discussion with family members, caretakers and with consultants  Document social determinants of health affecting pt's care  Document your decision making why or why not admission, treatments were needed:32947:::1}   Final diagnoses:  None    ED Discharge Orders     None        "

## 2024-12-19 NOTE — ED Notes (Signed)
 Patient transported to X-ray

## 2024-12-19 NOTE — ED Triage Notes (Addendum)
 Patient arrives via [facility] for panic attack and UTI symptoms x a month. Back pain x a couple days and headache, cough x3 weeks. Boil under left armpit. Noncompliant for all meds unspecified time. Patient alert oriented x4. GCS 15.   EMS vitals BP 200/100 HR 85 100 on room air  CBG 311 -- noncompliant with meds  20 LAC.

## 2024-12-19 NOTE — ED Provider Notes (Incomplete)
 " [place] EMERGENCY DEPARTMENT AT [facility] Provider Note   HPI/ROS    History obtained from {bb collateral history:58348}  Morgan Taylor is a 63 y.o. female who presents for No chief complaint on file. 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   ***  MDM   I have reviewed the nursing documentation, vital signs, as well as the past medical history, surgical history, family history, and social history.  Initial Assessment:  Differential diagnosis considered but not limited to: ***  ***  Interventions:***  I personally reviewed and interpreted patient's labs and EKG*** which were notable for ***  I personally reviewed and interpreted patient's imaging which was notable for ***  Upon reevaluation ***  Disposition:  {ED Dispo:29898}    This patient was staffed with Dr. PIERRETTE who supervised the visit and agreed with the plan of care.   Due to the patients current presenting symptoms, physical exam findings, and the workup stated above, it is thought that the etiology of the patients current presentation is: No diagnosis found.   Clinical Complexity A medically appropriate history, review of systems, and physical exam was performed.  Factors that affect the complexity of this encounter: {DATA REVIEWED HX MDM AMOUNT/COMPLEXITY LIMITED:20770::assessment of correct protocol,laboratory work from this visit,notes from other physicians (***),review of echocardiogram/EKG results}  My independent interpretations of diagnostic studies are documented in the ED course above.   If decision rules were used in this patient's evaluation, they are listed below.  *** Click  here for ABCD2, HEART and other calculatorsREFRESH Note before signing   Patient's presentation is most consistent with {EM COPA:27473}  MDM generated using voice dictation software and may contain dictation errors. Please contact me for any clarification or with any questions.    Physical Exam, PMH, PSH, Family History, and Social Hsitory   Vitals:   12/19/24 1719 12/19/24 1722  BP:  138/79  Pulse:  82  Resp:  18  Temp:  98.3 F (36.8 C)  TempSrc:  Oral  SpO2:  98%  Weight: 69 kg   Height: 5' 2 (1.575 m)     Physical Exam  Past Medical History:  Diagnosis Date   Anxiety    Arthritis    Breast cancer (HCC)    Cancer (HCC) 2009   lumpectomy   Depression    takes Remeron  nightly   Diabetes mellitus    takes Lantus  and Victoza . Average fasting blood sugar runs 89   GERD (gastroesophageal reflux disease)    takes Omeprazole  daily   Hyperlipemia    takes Lipitor daily   Hypertension    takes Amlodipine  and Losartan  daily   Hypothyroidism    takes Synthroid  daily   Neck pain    Numbness    in both hands   Personal history of radiation therapy    Primary localized osteoarthrosis of right shoulder 03/21/2017     Past Surgical History:  Procedure Laterality Date   APPENDECTOMY     BREAST BIOPSY     BREAST LUMPECTOMY Left 2009   CESAREAN SECTION     COLONOSCOPY     DILATION AND CURETTAGE OF UTERUS     SHOULDER ARTHROSCOPY Left    cleaned out joint   TOTAL HIP ARTHROPLASTY Left 11/10/2020   Procedure: TOTAL  HIP ARTHROPLASTY;  Surgeon: Josefina Chew, MD;  Location: [facility];  Service: Orthopedics;  Laterality: Left;   TOTAL KNEE ARTHROPLASTY Right 08/15/2014   Procedure: RIGHT TOTAL KNEE ARTHROPLASTY;  Surgeon: LELON JONETTA Shari Mickey., MD;  Location: [facility] OR;  Service: Orthopedics;  Laterality: Right;   TOTAL KNEE ARTHROPLASTY Left 01/23/2015   Procedure: TOTAL KNEE ARTHROPLASTY;  Surgeon: LELON JONETTA Shari Mickey., MD;  Location: [facility] OR;  Service: Orthopedics;  Laterality: Left;   TOTAL  SHOULDER ARTHROPLASTY Left 02/19/2016   Procedure: LEFT TOTAL SHOULDER ARTHROPLASTY;  Surgeon: Chew Josefina, MD;  Location: [facility] OR;  Service: Orthopedics;  Laterality: Left;   TOTAL SHOULDER ARTHROPLASTY Right 03/21/2017   Procedure: TOTAL SHOULDER ARTHROPLASTY;  Surgeon: Chew Josefina, MD;  Location: [facility] OR;  Service: Orthopedics;  Laterality: Right;     Family History  Problem Relation Age of Onset   Cancer Paternal Grandmother        NOS   Pancreatic cancer Paternal Aunt    Cancer Paternal Aunt        NOS   Breast cancer Mother 50       Stage 3   Colon cancer Neg Hx    Rectal cancer Neg Hx    Stomach cancer Neg Hx     Social History   Tobacco Use   Smoking status: Former   Smokeless tobacco: Never   Tobacco comments:    quit smoking 14 yrs ago  Substance Use Topics   Alcohol use: No     Procedures   If procedures were preformed on this patient, they are listed below:  Procedures   Electronically signed by:   Glendia Carlin Ancona, M.D. PGY-2, Emergency Medicine   Please note that this documentation was produced with the assistance of voice-to-text technology and may contain errors.  "

## 2024-12-20 MED ORDER — CYCLOBENZAPRINE HCL 10 MG PO TABS: 10.0000 mg | ORAL_TABLET | Freq: Two times a day (BID) | ORAL | 0 refills | Status: AC | PRN

## 2024-12-22 LAB — URINE CULTURE: Culture: >=100000 — AB

## 2024-12-23 ENCOUNTER — Other Ambulatory Visit (HOSPITAL_COMMUNITY): Payer: Self-pay

## 2024-12-23 ENCOUNTER — Telehealth (HOSPITAL_BASED_OUTPATIENT_CLINIC_OR_DEPARTMENT_OTHER): Payer: Self-pay | Admitting: *Deleted

## 2024-12-23 MED ORDER — CEPHALEXIN 500 MG PO CAPS
500.0000 mg | ORAL_CAPSULE | Freq: Two times a day (BID) | ORAL | 0 refills | Status: AC
  Filled 2024-12-23: qty 6, 3d supply, fill #0

## 2024-12-23 NOTE — Telephone Encounter (Signed)
 Post ED Visit - Positive Culture Follow-up: Successful Patient Follow-Up  Culture assessed and recommendations reviewed by:  [x]  Leonor Bash, Pharm.D. []  Venetia Gully, Pharm.D., BCPS AQ-ID []  Garrel Crews, Pharm.D., BCPS []  Almarie Lunger, Pharm.D., BCPS []  [address], [address].D., BCPS, AAHIVP []  Rosaline Bihari, Pharm.D., BCPS, AAHIVP []  Vernell Meier, PharmD, BCPS []  Latanya Hint, PharmD, BCPS []  Donald Medley, PharmD, BCPS []  Rocky Bold, PharmD  Positive urine culture  [x]  Patient discharged without antimicrobial prescription and treatment is now indicated []  Organism is resistant to prescribed ED discharge antimicrobial []  Patient with positive blood cultures  Changes discussed with ED provider: Terrall Learn, PA-C New antibiotic prescription: Keflex  500 mg po every 12 hrs x 3 days Called to [facility]  Contacted patient, date 12/23/24, time 1330   Morgan Taylor 12/23/2024, 1:34 PM

## 2024-12-23 NOTE — Telephone Encounter (Addendum)
 Post ED Visit - Positive Culture Follow-up: Unsuccessful Patient Follow-up  Culture assessed and recommendations reviewed by:  [x]  Leonor Bash, Pharm.D. []  Venetia Gully, Pharm.D., BCPS AQ-ID []  Garrel Crews, Pharm.D., BCPS []  Almarie Lunger, Pharm.D., BCPS []  [place], [address].D., BCPS, AAHIVP []  Rosaline Bihari, Pharm.D., BCPS, AAHIVP []  Massie Rigg, PharmD []  Jodie Rower, PharmD, BCPS  Positive urine culture  [x]  Patient discharged without antimicrobial prescription and treatment is now indicated []  Organism is resistant to prescribed ED discharge antimicrobial []  Patient with positive blood cultures  Plan per Dr. Terrall Hinnant: Keflex  500 mg po every 12 hrs x 3 days Unable to contact patient after 3 attempts.  Left voicemail for pt's daughter requesting a call back to [facility].  Unable to mail follow up letter as pt is homeless and has no address on file.  Lorita Barnie Pereyra 12/23/2024, 12:59 PM

## 2025-01-02 ENCOUNTER — Other Ambulatory Visit (HOSPITAL_COMMUNITY): Payer: Self-pay

## 2025-01-14 ENCOUNTER — Other Ambulatory Visit (HOSPITAL_COMMUNITY): Payer: Self-pay

## 2025-01-14 ENCOUNTER — Encounter (HOSPITAL_COMMUNITY): Payer: Self-pay

## 2025-01-14 MED ORDER — ATORVASTATIN CALCIUM 20 MG PO TABS
20.0000 mg | ORAL_TABLET | Freq: Every day | ORAL | 3 refills | Status: AC
  Filled 2025-01-28 (×2): qty 90, 90d supply, fill #0

## 2025-01-14 MED ORDER — AMLODIPINE BESYLATE 5 MG PO TABS
5.0000 mg | ORAL_TABLET | Freq: Every day | ORAL | 3 refills | Status: AC
  Filled 2025-01-14 – 2025-01-28 (×3): qty 90, 90d supply, fill #0

## 2025-01-14 MED ORDER — DEXCOM G7 SENSOR MISC
3 refills | Status: AC
  Filled 2025-01-14 – 2025-01-28 (×2): qty 9, 90d supply, fill #0

## 2025-01-14 MED ORDER — LEVOTHYROXINE SODIUM 75 MCG PO TABS
75.0000 ug | ORAL_TABLET | Freq: Every day | ORAL | 3 refills | Status: AC
  Filled 2025-01-14 – 2025-01-28 (×4): qty 90, 90d supply, fill #0

## 2025-01-14 MED ORDER — ACCU-CHEK SOFTCLIX LANCETS MISC
3 refills | Status: AC
  Filled 2025-01-14: qty 300, 90d supply, fill #0
  Filled 2025-01-28: qty 300, 100d supply, fill #0

## 2025-01-14 MED ORDER — LOSARTAN POTASSIUM 100 MG PO TABS
100.0000 mg | ORAL_TABLET | Freq: Every day | ORAL | 3 refills | Status: AC
  Filled 2025-01-14 – 2025-01-28 (×4): qty 90, 90d supply, fill #0

## 2025-01-14 MED ORDER — NITROFURANTOIN MONOHYD MACRO 100 MG PO CAPS
100.0000 mg | ORAL_CAPSULE | Freq: Two times a day (BID) | ORAL | 0 refills | Status: AC
  Filled 2025-01-14 – 2025-01-22 (×3): qty 10, 5d supply, fill #0

## 2025-01-14 MED ORDER — BD PEN NEEDLE NANO 2ND GEN 32G X 4 MM MISC
3 refills | Status: AC
  Filled 2025-01-14: qty 100, 90d supply, fill #0
  Filled 2025-01-28: qty 100, 33d supply, fill #0

## 2025-01-14 MED ORDER — INSULIN GLARGINE SOLOSTAR 100 UNIT/ML ~~LOC~~ SOPN
12.0000 [IU] | PEN_INJECTOR | Freq: Every evening | SUBCUTANEOUS | 3 refills | Status: AC
  Filled 2025-01-14 – 2025-01-28 (×2): qty 15, 37d supply, fill #0

## 2025-01-14 MED ORDER — ACCU-CHEK GUIDE W/DEVICE KIT
PACK | Freq: Three times a day (TID) | 0 refills | Status: AC
  Filled 2025-01-14 – 2025-01-28 (×2): qty 1, 30d supply, fill #0

## 2025-01-14 MED ORDER — ACCU-CHEK GUIDE TEST VI STRP
ORAL_STRIP | 3 refills | Status: AC
  Filled 2025-01-14: qty 100, 30d supply, fill #0
  Filled 2025-01-28: qty 300, 100d supply, fill #0

## 2025-01-14 MED ORDER — OZEMPIC (0.25 OR 0.5 MG/DOSE) 2 MG/3ML ~~LOC~~ SOPN
PEN_INJECTOR | SUBCUTANEOUS | 3 refills | Status: AC
  Filled 2025-01-14: qty 3, 30d supply, fill #0
  Filled 2025-01-28: qty 9, 84d supply, fill #0

## 2025-01-22 ENCOUNTER — Other Ambulatory Visit (HOSPITAL_COMMUNITY): Payer: Self-pay

## 2025-01-28 ENCOUNTER — Other Ambulatory Visit: Payer: Self-pay

## 2025-01-28 ENCOUNTER — Other Ambulatory Visit (HOSPITAL_COMMUNITY): Payer: Self-pay
